# Patient Record
Sex: Male | Born: 1955
Health system: Southern US, Community
[De-identification: ages and names within clinical notes are randomized; demographics above are authoritative.]

## PROBLEM LIST (undated history)

## (undated) DIAGNOSIS — E785 Hyperlipidemia, unspecified: Secondary | ICD-10-CM

## (undated) DIAGNOSIS — I10 Essential (primary) hypertension: Secondary | ICD-10-CM

## (undated) DIAGNOSIS — J449 Chronic obstructive pulmonary disease, unspecified: Secondary | ICD-10-CM

## (undated) DIAGNOSIS — K635 Polyp of colon: Secondary | ICD-10-CM

## (undated) DIAGNOSIS — R0602 Shortness of breath: Secondary | ICD-10-CM

## (undated) DIAGNOSIS — J439 Emphysema, unspecified: Secondary | ICD-10-CM

## (undated) DIAGNOSIS — I509 Heart failure, unspecified: Secondary | ICD-10-CM

## (undated) HISTORY — DX: Hyperlipidemia, unspecified: E78.5

## (undated) HISTORY — DX: Polyp of colon: K63.5

## (undated) HISTORY — DX: Emphysema, unspecified: J43.9

## (undated) HISTORY — DX: Heart failure, unspecified: I50.9

## (undated) HISTORY — PX: COLONOSCOPY: SHX174

## (undated) HISTORY — PX: LIPOMA EXCISION: SHX5283

---

## 2001-05-27 ENCOUNTER — Emergency Department (HOSPITAL_COMMUNITY): Admission: EM | Admit: 2001-05-27 | Discharge: 2001-05-28 | Payer: Self-pay | Admitting: Emergency Medicine

## 2001-05-28 ENCOUNTER — Encounter: Payer: Self-pay | Admitting: Emergency Medicine

## 2001-06-28 ENCOUNTER — Inpatient Hospital Stay (HOSPITAL_COMMUNITY): Admission: EM | Admit: 2001-06-28 | Discharge: 2001-07-01 | Payer: Self-pay

## 2001-06-30 ENCOUNTER — Encounter: Payer: Self-pay | Admitting: Family Medicine

## 2001-07-06 ENCOUNTER — Encounter: Admission: RE | Admit: 2001-07-06 | Discharge: 2001-07-06 | Payer: Self-pay | Admitting: Family Medicine

## 2001-10-26 ENCOUNTER — Emergency Department (HOSPITAL_COMMUNITY): Admission: EM | Admit: 2001-10-26 | Discharge: 2001-10-27 | Payer: Self-pay

## 2002-11-16 ENCOUNTER — Emergency Department (HOSPITAL_COMMUNITY): Admission: EM | Admit: 2002-11-16 | Discharge: 2002-11-16 | Payer: Self-pay | Admitting: Emergency Medicine

## 2002-11-16 ENCOUNTER — Encounter: Payer: Self-pay | Admitting: Emergency Medicine

## 2004-05-31 ENCOUNTER — Inpatient Hospital Stay (HOSPITAL_COMMUNITY): Admission: EM | Admit: 2004-05-31 | Discharge: 2004-06-01 | Payer: Self-pay | Admitting: Emergency Medicine

## 2004-05-31 ENCOUNTER — Encounter (INDEPENDENT_AMBULATORY_CARE_PROVIDER_SITE_OTHER): Payer: Self-pay | Admitting: Cardiology

## 2004-07-01 ENCOUNTER — Emergency Department (HOSPITAL_COMMUNITY): Admission: EM | Admit: 2004-07-01 | Discharge: 2004-07-01 | Payer: Self-pay | Admitting: Emergency Medicine

## 2004-08-05 ENCOUNTER — Ambulatory Visit: Payer: Self-pay | Admitting: Family Medicine

## 2004-08-26 ENCOUNTER — Inpatient Hospital Stay (HOSPITAL_COMMUNITY): Admission: EM | Admit: 2004-08-26 | Discharge: 2004-08-31 | Payer: Self-pay | Admitting: Emergency Medicine

## 2004-08-26 ENCOUNTER — Ambulatory Visit: Payer: Self-pay | Admitting: Family Medicine

## 2004-09-06 ENCOUNTER — Ambulatory Visit: Payer: Self-pay | Admitting: Family Medicine

## 2004-11-01 ENCOUNTER — Ambulatory Visit: Payer: Self-pay | Admitting: Family Medicine

## 2004-12-03 ENCOUNTER — Ambulatory Visit: Payer: Self-pay | Admitting: Sports Medicine

## 2005-05-09 ENCOUNTER — Ambulatory Visit (HOSPITAL_COMMUNITY): Admission: RE | Admit: 2005-05-09 | Discharge: 2005-05-09 | Payer: Self-pay | Admitting: Family Medicine

## 2005-05-09 ENCOUNTER — Ambulatory Visit: Payer: Self-pay | Admitting: Family Medicine

## 2005-07-30 ENCOUNTER — Emergency Department (HOSPITAL_COMMUNITY): Admission: EM | Admit: 2005-07-30 | Discharge: 2005-07-30 | Payer: Self-pay | Admitting: Emergency Medicine

## 2005-09-13 ENCOUNTER — Ambulatory Visit: Payer: Self-pay | Admitting: Family Medicine

## 2005-09-27 ENCOUNTER — Emergency Department (HOSPITAL_COMMUNITY): Admission: EM | Admit: 2005-09-27 | Discharge: 2005-09-27 | Payer: Self-pay | Admitting: Emergency Medicine

## 2005-10-18 ENCOUNTER — Emergency Department (HOSPITAL_COMMUNITY): Admission: EM | Admit: 2005-10-18 | Discharge: 2005-10-18 | Payer: Self-pay | Admitting: Family Medicine

## 2005-10-26 ENCOUNTER — Ambulatory Visit: Payer: Self-pay | Admitting: Pulmonary Disease

## 2005-10-26 ENCOUNTER — Ambulatory Visit: Payer: Self-pay | Admitting: Family Medicine

## 2005-10-26 ENCOUNTER — Inpatient Hospital Stay (HOSPITAL_COMMUNITY): Admission: EM | Admit: 2005-10-26 | Discharge: 2005-11-02 | Payer: Self-pay | Admitting: Emergency Medicine

## 2005-11-08 ENCOUNTER — Ambulatory Visit: Payer: Self-pay | Admitting: Sports Medicine

## 2006-01-27 ENCOUNTER — Ambulatory Visit: Payer: Self-pay | Admitting: Family Medicine

## 2006-04-13 ENCOUNTER — Ambulatory Visit: Payer: Self-pay | Admitting: Sports Medicine

## 2006-05-15 ENCOUNTER — Ambulatory Visit: Payer: Self-pay | Admitting: Family Medicine

## 2007-01-18 DIAGNOSIS — I10 Essential (primary) hypertension: Secondary | ICD-10-CM

## 2007-01-18 DIAGNOSIS — Z86010 Personal history of colonic polyps: Secondary | ICD-10-CM

## 2007-01-31 ENCOUNTER — Telehealth: Payer: Self-pay | Admitting: *Deleted

## 2007-04-23 ENCOUNTER — Ambulatory Visit: Payer: Self-pay | Admitting: Sports Medicine

## 2007-04-23 LAB — CONVERTED CEMR LAB

## 2007-04-27 ENCOUNTER — Encounter (INDEPENDENT_AMBULATORY_CARE_PROVIDER_SITE_OTHER): Payer: Self-pay | Admitting: Family Medicine

## 2007-04-27 ENCOUNTER — Ambulatory Visit: Payer: Self-pay | Admitting: Sports Medicine

## 2007-04-27 LAB — CONVERTED CEMR LAB
Cholesterol: 174 mg/dL (ref 0–200)
HDL: 58 mg/dL (ref >39)
Total CHOL/HDL Ratio: 3

## 2007-04-30 ENCOUNTER — Encounter (INDEPENDENT_AMBULATORY_CARE_PROVIDER_SITE_OTHER): Payer: Self-pay | Admitting: Family Medicine

## 2007-05-14 ENCOUNTER — Telehealth: Payer: Self-pay | Admitting: *Deleted

## 2007-07-26 ENCOUNTER — Telehealth (INDEPENDENT_AMBULATORY_CARE_PROVIDER_SITE_OTHER): Payer: Self-pay | Admitting: Family Medicine

## 2007-07-27 ENCOUNTER — Telehealth (INDEPENDENT_AMBULATORY_CARE_PROVIDER_SITE_OTHER): Payer: Self-pay | Admitting: Family Medicine

## 2007-08-10 ENCOUNTER — Emergency Department (HOSPITAL_COMMUNITY): Admission: EM | Admit: 2007-08-10 | Discharge: 2007-08-11 | Payer: Self-pay | Admitting: Emergency Medicine

## 2007-08-13 ENCOUNTER — Telehealth: Payer: Self-pay | Admitting: *Deleted

## 2007-08-14 ENCOUNTER — Ambulatory Visit: Payer: Self-pay | Admitting: Family Medicine

## 2007-08-21 ENCOUNTER — Encounter (INDEPENDENT_AMBULATORY_CARE_PROVIDER_SITE_OTHER): Payer: Self-pay | Admitting: Family Medicine

## 2007-08-21 ENCOUNTER — Ambulatory Visit (HOSPITAL_COMMUNITY): Admission: RE | Admit: 2007-08-21 | Discharge: 2007-08-21 | Payer: Self-pay | Admitting: Family Medicine

## 2007-08-27 ENCOUNTER — Telehealth (INDEPENDENT_AMBULATORY_CARE_PROVIDER_SITE_OTHER): Payer: Self-pay | Admitting: Family Medicine

## 2007-09-06 ENCOUNTER — Ambulatory Visit: Payer: Self-pay | Admitting: Family Medicine

## 2007-10-05 ENCOUNTER — Ambulatory Visit: Payer: Self-pay | Admitting: Family Medicine

## 2007-10-05 ENCOUNTER — Encounter (INDEPENDENT_AMBULATORY_CARE_PROVIDER_SITE_OTHER): Payer: Self-pay | Admitting: Family Medicine

## 2007-10-05 LAB — CONVERTED CEMR LAB
CO2: 24 meq/L (ref 19–32)
Calcium: 10.2 mg/dL (ref 8.4–10.5)
Creatinine, Ser: 1.14 mg/dL (ref 0.40–1.50)
Glucose, Bld: 65 mg/dL — ABNORMAL LOW (ref 70–99)

## 2007-10-06 ENCOUNTER — Encounter (INDEPENDENT_AMBULATORY_CARE_PROVIDER_SITE_OTHER): Payer: Self-pay | Admitting: Family Medicine

## 2007-10-29 ENCOUNTER — Ambulatory Visit: Payer: Self-pay | Admitting: Family Medicine

## 2007-12-10 ENCOUNTER — Telehealth (INDEPENDENT_AMBULATORY_CARE_PROVIDER_SITE_OTHER): Payer: Self-pay | Admitting: Family Medicine

## 2008-01-05 ENCOUNTER — Emergency Department (HOSPITAL_COMMUNITY): Admission: EM | Admit: 2008-01-05 | Discharge: 2008-01-05 | Payer: Self-pay | Admitting: Emergency Medicine

## 2008-01-25 ENCOUNTER — Telehealth (INDEPENDENT_AMBULATORY_CARE_PROVIDER_SITE_OTHER): Payer: Self-pay | Admitting: Family Medicine

## 2008-02-15 ENCOUNTER — Ambulatory Visit: Payer: Self-pay | Admitting: Family Medicine

## 2008-06-20 ENCOUNTER — Ambulatory Visit: Payer: Self-pay | Admitting: Family Medicine

## 2008-06-20 ENCOUNTER — Encounter: Payer: Self-pay | Admitting: *Deleted

## 2008-07-30 ENCOUNTER — Telehealth: Payer: Self-pay | Admitting: *Deleted

## 2008-08-04 ENCOUNTER — Telehealth (INDEPENDENT_AMBULATORY_CARE_PROVIDER_SITE_OTHER): Payer: Self-pay | Admitting: Family Medicine

## 2008-09-01 ENCOUNTER — Telehealth (INDEPENDENT_AMBULATORY_CARE_PROVIDER_SITE_OTHER): Payer: Self-pay | Admitting: Family Medicine

## 2008-11-25 ENCOUNTER — Telehealth (INDEPENDENT_AMBULATORY_CARE_PROVIDER_SITE_OTHER): Payer: Self-pay | Admitting: Family Medicine

## 2008-12-02 ENCOUNTER — Ambulatory Visit: Payer: Self-pay | Admitting: Family Medicine

## 2008-12-03 ENCOUNTER — Encounter (INDEPENDENT_AMBULATORY_CARE_PROVIDER_SITE_OTHER): Payer: Self-pay | Admitting: Family Medicine

## 2008-12-04 ENCOUNTER — Telehealth (INDEPENDENT_AMBULATORY_CARE_PROVIDER_SITE_OTHER): Payer: Self-pay | Admitting: Family Medicine

## 2008-12-08 ENCOUNTER — Telehealth (INDEPENDENT_AMBULATORY_CARE_PROVIDER_SITE_OTHER): Payer: Self-pay | Admitting: *Deleted

## 2008-12-12 ENCOUNTER — Encounter (INDEPENDENT_AMBULATORY_CARE_PROVIDER_SITE_OTHER): Payer: Self-pay | Admitting: Family Medicine

## 2008-12-22 ENCOUNTER — Observation Stay (HOSPITAL_COMMUNITY): Admission: EM | Admit: 2008-12-22 | Discharge: 2008-12-24 | Payer: Self-pay | Admitting: Emergency Medicine

## 2008-12-22 ENCOUNTER — Encounter: Payer: Self-pay | Admitting: Family Medicine

## 2008-12-22 ENCOUNTER — Ambulatory Visit: Payer: Self-pay | Admitting: Family Medicine

## 2008-12-31 ENCOUNTER — Ambulatory Visit: Payer: Self-pay | Admitting: Family Medicine

## 2008-12-31 DIAGNOSIS — J449 Chronic obstructive pulmonary disease, unspecified: Secondary | ICD-10-CM | POA: Insufficient documentation

## 2008-12-31 DIAGNOSIS — N529 Male erectile dysfunction, unspecified: Secondary | ICD-10-CM

## 2009-02-24 ENCOUNTER — Telehealth: Payer: Self-pay | Admitting: *Deleted

## 2009-04-17 ENCOUNTER — Ambulatory Visit: Payer: Self-pay | Admitting: Family Medicine

## 2009-04-17 ENCOUNTER — Encounter (INDEPENDENT_AMBULATORY_CARE_PROVIDER_SITE_OTHER): Payer: Self-pay | Admitting: Family Medicine

## 2009-04-17 LAB — CONVERTED CEMR LAB
ALT: 28 units/L (ref 0–53)
BUN: 17 mg/dL (ref 6–23)
CO2: 22 meq/L (ref 19–32)
Cholesterol: 171 mg/dL (ref 0–200)
Creatinine, Ser: 1.24 mg/dL (ref 0.40–1.50)
HDL: 49 mg/dL (ref >39)
Total Bilirubin: 0.5 mg/dL (ref 0.3–1.2)
Total CHOL/HDL Ratio: 3.5
VLDL: 14 mg/dL (ref 0–40)

## 2009-04-18 ENCOUNTER — Encounter (INDEPENDENT_AMBULATORY_CARE_PROVIDER_SITE_OTHER): Payer: Self-pay | Admitting: Family Medicine

## 2009-07-25 ENCOUNTER — Emergency Department (HOSPITAL_COMMUNITY): Admission: EM | Admit: 2009-07-25 | Discharge: 2009-07-25 | Payer: Self-pay | Admitting: Emergency Medicine

## 2009-08-03 ENCOUNTER — Ambulatory Visit: Payer: Self-pay | Admitting: Family Medicine

## 2009-08-04 ENCOUNTER — Encounter (INDEPENDENT_AMBULATORY_CARE_PROVIDER_SITE_OTHER): Payer: Self-pay | Admitting: *Deleted

## 2009-08-04 DIAGNOSIS — F17200 Nicotine dependence, unspecified, uncomplicated: Secondary | ICD-10-CM | POA: Insufficient documentation

## 2009-09-25 ENCOUNTER — Telehealth: Payer: Self-pay | Admitting: *Deleted

## 2009-09-28 ENCOUNTER — Telehealth: Payer: Self-pay | Admitting: *Deleted

## 2009-10-01 ENCOUNTER — Emergency Department (HOSPITAL_COMMUNITY): Admission: EM | Admit: 2009-10-01 | Discharge: 2009-10-02 | Payer: Self-pay | Admitting: Emergency Medicine

## 2009-10-20 ENCOUNTER — Inpatient Hospital Stay (HOSPITAL_COMMUNITY): Admission: EM | Admit: 2009-10-20 | Discharge: 2009-10-22 | Payer: Self-pay | Admitting: Emergency Medicine

## 2009-10-20 ENCOUNTER — Ambulatory Visit: Payer: Self-pay | Admitting: Family Medicine

## 2009-10-20 ENCOUNTER — Encounter: Payer: Self-pay | Admitting: Family Medicine

## 2009-11-02 ENCOUNTER — Ambulatory Visit: Payer: Self-pay | Admitting: Family Medicine

## 2010-01-06 ENCOUNTER — Encounter: Payer: Self-pay | Admitting: Family Medicine

## 2010-02-22 ENCOUNTER — Encounter: Payer: Self-pay | Admitting: Family Medicine

## 2010-03-04 ENCOUNTER — Emergency Department (HOSPITAL_COMMUNITY): Admission: EM | Admit: 2010-03-04 | Discharge: 2010-03-04 | Payer: Self-pay | Admitting: Emergency Medicine

## 2010-05-17 ENCOUNTER — Encounter: Payer: Self-pay | Admitting: Family Medicine

## 2010-05-19 ENCOUNTER — Encounter: Admission: RE | Admit: 2010-05-19 | Discharge: 2010-05-19 | Payer: Self-pay | Admitting: Family Medicine

## 2010-05-19 ENCOUNTER — Ambulatory Visit: Payer: Self-pay | Admitting: Family Medicine

## 2010-05-20 ENCOUNTER — Encounter: Payer: Self-pay | Admitting: *Deleted

## 2010-05-20 ENCOUNTER — Telehealth: Payer: Self-pay | Admitting: Family Medicine

## 2010-07-03 ENCOUNTER — Emergency Department (HOSPITAL_COMMUNITY): Admission: EM | Admit: 2010-07-03 | Discharge: 2010-07-04 | Payer: Self-pay | Admitting: Emergency Medicine

## 2010-07-04 ENCOUNTER — Encounter: Payer: Self-pay | Admitting: Family Medicine

## 2010-09-13 ENCOUNTER — Emergency Department (HOSPITAL_COMMUNITY): Admission: EM | Admit: 2010-09-13 | Discharge: 2010-09-13 | Payer: Self-pay | Admitting: Emergency Medicine

## 2010-09-26 ENCOUNTER — Telehealth: Payer: Self-pay | Admitting: Family Medicine

## 2010-09-26 ENCOUNTER — Emergency Department (HOSPITAL_COMMUNITY): Admission: EM | Admit: 2010-09-26 | Discharge: 2010-09-26 | Payer: Self-pay | Admitting: Emergency Medicine

## 2010-09-29 ENCOUNTER — Encounter: Payer: Self-pay | Admitting: Family Medicine

## 2010-10-12 ENCOUNTER — Ambulatory Visit: Payer: Self-pay | Admitting: Family Medicine

## 2010-10-12 ENCOUNTER — Encounter: Payer: Self-pay | Admitting: Family Medicine

## 2010-10-12 LAB — CONVERTED CEMR LAB
BUN: 13 mg/dL (ref 6–23)
Calcium: 9.8 mg/dL (ref 8.4–10.5)
Creatinine, Ser: 1.03 mg/dL (ref 0.40–1.50)
Glucose, Bld: 91 mg/dL (ref 70–99)
HCT: 45.5 % (ref 39.0–52.0)
PSA: 0.38 ng/mL (ref <=4.00)
Platelets: 267 10*3/uL (ref 150–400)
RDW: 12.7 % (ref 11.5–15.5)
Sodium: 139 meq/L (ref 135–145)
WBC: 5.9 10*3/uL (ref 4.0–10.5)

## 2010-10-26 ENCOUNTER — Encounter: Payer: Self-pay | Admitting: *Deleted

## 2010-10-26 ENCOUNTER — Ambulatory Visit: Payer: Self-pay

## 2010-11-10 ENCOUNTER — Ambulatory Visit: Payer: Self-pay | Admitting: Family Medicine

## 2010-11-12 ENCOUNTER — Telehealth: Payer: Self-pay | Admitting: Family Medicine

## 2010-11-17 ENCOUNTER — Encounter: Payer: Self-pay | Admitting: Family Medicine

## 2010-11-17 ENCOUNTER — Inpatient Hospital Stay (HOSPITAL_COMMUNITY)
Admission: EM | Admit: 2010-11-17 | Discharge: 2010-11-19 | Payer: Self-pay | Source: Home / Self Care | Attending: Family Medicine | Admitting: Family Medicine

## 2010-11-30 ENCOUNTER — Ambulatory Visit: Admission: RE | Admit: 2010-11-30 | Discharge: 2010-11-30 | Payer: Self-pay | Source: Home / Self Care

## 2010-12-21 NOTE — Miscellaneous (Signed)
Summary: proventil approved  Clinical Lists Changes prescriptions solutions approved the Proventil HFA for 1 yr.Golden Circle RN  May 20, 2010 12:17 PM

## 2010-12-21 NOTE — Assessment & Plan Note (Signed)
Summary: Hospital Consult   Primary Care Provider:  Cat Ta MD  CC:  SOB.  History of Present Illness: This is a 55  yo male with a PMH of COPD and HTN who presented to the ED w/ SOB and chest tightness.  EDP gave patient one treatment of albuterol/atrovent and prednisone.  After about an hour, the patient's symptoms resolved and he returned to baseline.  Initially, he was so SOB that he could only speak in phrases, but by the time we examined him, he was up walking and speaking in full sentences without any difficulty.   ROS: He c/o productive cough with white sputum which he says is a chronic problem.  He denies any CP, N/V, fever, or chills.  Denies any HA, diarrhea/constipation, numbness/tingling in extremities.    Current Problems (verified): 1)  Chronic Obstructive Pulmonary Disease, Acute Exacerbation  (ICD-491.21) 2)  Tobacco User  (ICD-305.1) 3)  Uri  (ICD-465.9) 4)  Special Screening Malignant Neoplasm of Prostate  (ICD-V76.44) 5)  Erectile Dysfunction  (ICD-302.72) 6)  COPD  (ICD-496) 7)  Tobacco Use, Quit  (ICD-V15.82) 8)  Rhinitis, Allergic  (ICD-477.9) 9)  Hypertension, Benign Systemic  (ICD-401.1) 10)  Colon Polyp  (ICD-211.3)  Current Medications (verified): 1)  Advair Diskus 500-50 Mcg/dose  Misc (Fluticasone-Salmeterol) .... One Inhalation Twice Daily As Directed 2)  Allegra 180 Mg  Tabs (Fexofenadine Hcl) .Marland Kitchen.. 1 Tablet By Mouth Daily 3)  Albuterol Sulfate (2.5 Mg/68ml) 0.083% Nebu (Albuterol Sulfate) .... Use 4 Times A Day As Needed For Sob 4)  Amlodipine Besylate 10 Mg Tabs (Amlodipine Besylate) .Marland Kitchen.. 1 Tab By Mouth Daily For Blood Pressure. 5)  Viagra 100 Mg Tabs (Sildenafil Citrate) .... 1/2 Tablet By Mouth As Directed 6)  Proair Hfa 108 (90 Base) Mcg/act Aers (Albuterol Sulfate) .... 2 Puffs Ever 4-6 Hours As Needed Wheezing or Short of Breath. Dispense For 1 Month. 7)  Tessalon Perles 100 Mg  Caps (Benzonatate) .... One By Mouth Tid 8)  Prednisone 20 Mg Tabs  (Prednisone) .... 3 By Mouth X 10 Days 9)  Robitussin To Go Cgh/chest Dm 100-10 Mg/64ml Liqd (Dextromethorphan-Guaifenesin)  Allergies (verified): 1)  ! * Spiriva  Past History:  Past Medical History: Last updated: 11/02/2009 COPD exac admit 10/2009: required step down bed for bipap, no intubation COPD- intubated twice! Hypertension Colonoscopy by Dr. Elnoria Howard in 2007 showed colon polyps (adenomatous and hyperplastic).  Rpt colonoscopy in 5 yrs. PFT (9/08)-> severe obstruction with positive response to bronchodilator  Past Surgical History: Last updated: 12/22/2008 none  Family History: Last updated: 10/20/2009 mother has asthma (updated 10/20/2009)  Social History: Last updated: 10/20/2009 On Disability secondary to lung disease. Used to work as a Retail banker.  Married. Born and raised in [place].  Education-through 9th grade.  Smoked 1/3 to 1/2 ppd for 30 years, quit in 2009.  Used to drink heavily, none now.  No drugs.  pt does report smoking occasionally (updated 10/20/2009)  Review of Systems       per HPI  Physical Exam  General:  alert.  NAD. Head:  normocephalic and atraumatic.   Eyes:  vision grossly intact, pupils equal, pupils round, and pupils reactive to light.   Ears:  R ear normal and L ear normal.   Mouth:  pharynx pink and moist and poor dentition.   Neck:  supple, no JVD, and no cervical lymphadenopathy.   Lungs:  no intercostal retractions, no accessory muscle use, R wheezes, and L wheezes.   Heart:  normal rate, regular rhythm, no murmur, no gallop, and no rub.   Abdomen:  soft, non-tender, and normal bowel sounds.   Additional Exam:  Vitals: T 98.4, P 88, RR 24. 133/84 94% ra Labs: wnl CXR: no acute disease   Impression & Recommendations:  Problem # 1:  CHRONIC OBSTRUCTIVE PULMONARY DISEASE, ACUTE EXACERBATION (ICD-491.21) Patient came to ED with SOB and chest tightness.  After an hour of breathing tx and prednisone 60mg , patient  felt much better.  Said that the chest tightness was improving.  He was up and walking around the ED without any O2 supplementation.  CXR was nml, vitals were stable, and labs were wnl.  Discussed with EDP our plan to d/c the patient home after receiving 2-3 more breathing tx's.  Advised patient to f/u with PCP in the next 7-10 days.  Gave him a Rx for Prednisone 60mg  by mouth daily x 5days.  Red flags discussed for promptly seeking medical care.  Patient understood and agreed with our plan to send him home.  Complete Medication List: 1)  Advair Diskus 500-50 Mcg/dose Misc (Fluticasone-salmeterol) .... One inhalation twice daily as directed 2)  Allegra 180 Mg Tabs (Fexofenadine hcl) .Marland Kitchen.. 1 tablet by mouth daily 3)  Albuterol Sulfate (2.5 Mg/17ml) 0.083% Nebu (Albuterol sulfate) .... Use 4 times a day as needed for sob 4)  Amlodipine Besylate 10 Mg Tabs (Amlodipine besylate) .Marland Kitchen.. 1 tab by mouth daily for blood pressure. 5)  Viagra 100 Mg Tabs (Sildenafil citrate) .... 1/2 tablet by mouth as directed 6)  Proair Hfa 108 (90 Base) Mcg/act Aers (Albuterol sulfate) .... 2 puffs ever 4-6 hours as needed wheezing or short of breath. dispense for 1 month. 7)  Tessalon Perles 100 Mg Caps (Benzonatate) .... One by mouth tid 8)  Prednisone 20 Mg Tabs (Prednisone) .... 3 by mouth x 10 days 9)  Robitussin To Go Cgh/chest Dm 100-10 Mg/36ml Liqd (Dextromethorphan-guaifenesin)

## 2010-12-21 NOTE — Assessment & Plan Note (Signed)
Summary: breathing problem, using nebs more now/ls   Vital Signs:  Patient profile:   55 year old male Weight:      152.6 pounds BMI:     23.12 Temp:     98.4 degrees F Pulse rate:   76 / minute BP sitting:   138 / 90  (left arm)  Vitals Entered By: Starleen Blue RN (May 19, 2010 10:23 AM) CC: breathing prob Is Patient Diabetic? No Pain Assessment Patient in pain? no        Primary Care Provider:  Cat Ta MD  CC:  breathing prob.  History of Present Illness: 55 year old M:  1. COPD: Hx of COPD with previous hospitalizations for exacerbations, last 12/10. Hx of intubation x 2. c/o increased dyspnea, cough, and sputum production over several weeks. He denies fever/chills, N/V/D, LE edema, CP. He endorses a runny nose that has worsened since turning the [facility] on. he has been taking several full dose aspirin daily for his cough.  Habits & Providers  Alcohol-Tobacco-Diet     Tobacco Status: quit > 6 months  Current Medications (verified): 1)  Advair Diskus 500-50 Mcg/dose  Misc (Fluticasone-Salmeterol) .... One Inhalation Twice Daily As Directed 2)  Allegra 180 Mg  Tabs (Fexofenadine Hcl) .Marland Kitchen.. 1 Tablet By Mouth Daily 3)  Albuterol Sulfate (2.5 Mg/24ml) 0.083% Nebu (Albuterol Sulfate) .... Use 4 Times A Day As Needed For Sob 4)  Amlodipine Besylate 10 Mg Tabs (Amlodipine Besylate) .Marland Kitchen.. 1 Tab By Mouth Daily For Blood Pressure. 5)  Viagra 100 Mg Tabs (Sildenafil Citrate) .... 1/2 Tablet By Mouth As Directed 6)  Proair Hfa 108 (90 Base) Mcg/act Aers (Albuterol Sulfate) .... 2 Puffs Ever 4-6 Hours As Needed Wheezing or Short of Breath. Dispense For 1 Month. 7)  Tessalon Perles 100 Mg  Caps (Benzonatate) .... One By Mouth Tid 8)  Prednisone 20 Mg Tabs (Prednisone) .... 3 By Mouth X 10 Days 9)  Doxycycline Hyclate 100 Mg Caps (Doxycycline Hyclate) .... Take 1 Tab Twice A Day  Allergies (verified): 1)  ! * Spiriva PMH-FH-SH reviewed for relevance  Social History: Smoking Status:   quit > 6 months  Review of Systems General:  Denies chills and fever. ENT:  Complains of nasal congestion; denies earache and sore throat. CV:  Complains of shortness of breath with exertion; denies chest pain or discomfort, palpitations, and swelling of feet. Resp:  Complains of cough, sputum productive, and wheezing. GI:  Denies abdominal pain, constipation, diarrhea, vomiting, and vomiting blood. Derm:  Denies rash.  Physical Exam  General:  Well-developed, well-nourished, in no acute distress; alert, appropriate and cooperative throughout examination. Vitals reviewed.  Lungs:  Decreased breath sounds bilaterally. Prolonged expiratory phase. Mild expiratory wheeze bilaterally. Heart:  Normal rate and regular rhythm. S1 and S2 normal without gallop, murmur, click, rub or other extra sounds. Pulses:  2 + DP. Extremities:  No edema. Skin:  Intact without suspicious lesions or rashes. Psych:  Normally interactive, good eye contact, not anxious appearing, and not depressed appearing.     Impression & Recommendations:  Problem # 1:  CHRONIC OBSTRUCTIVE PULMONARY DISEASE, ACUTE EXACERBATION (ICD-491.21) Assessment New Rx Doxy, Prednisone burst, Tessalon as needed for cough. Discussed appropriate use of inhalers. Will check CXR. Advised patient to stop taking several full dose ASA daily. Continue ASA 81 mg by mouth daily.  Orders: CXR- 2view (CXR) [facility]- Est  Level 4 ([address])  Problem # 2:  TOBACCO USE, QUIT (ICD-V15.82) Assessment: Unchanged  Problem #  3:  RHINITIS, ALLERGIC (ICD-477.9) Assessment: Unchanged  His updated medication list for this problem includes:    Allegra 180 Mg Tabs (Fexofenadine hcl) .Marland Kitchen... 1 tablet by mouth daily  Orders: [facility]- Est  Level 4 ([address])  Complete Medication List: 1)  Advair Diskus 500-50 Mcg/dose Misc (Fluticasone-salmeterol) .... One inhalation twice daily as directed 2)  Allegra 180 Mg Tabs (Fexofenadine hcl) .Marland Kitchen.. 1 tablet by mouth daily 3)   Albuterol Sulfate (2.5 Mg/36ml) 0.083% Nebu (Albuterol sulfate) .... Use 4 times a day as needed for sob 4)  Amlodipine Besylate 10 Mg Tabs (Amlodipine besylate) .Marland Kitchen.. 1 tab by mouth daily for blood pressure. 5)  Viagra 100 Mg Tabs (Sildenafil citrate) .... 1/2 tablet by mouth as directed 6)  Proair Hfa 108 (90 Base) Mcg/act Aers (Albuterol sulfate) .... 2 puffs ever 4-6 hours as needed wheezing or short of breath. dispense for 1 month. 7)  Tessalon Perles 100 Mg Caps (Benzonatate) .... One by mouth tid 8)  Prednisone 20 Mg Tabs (Prednisone) .... 3 by mouth x 10 days 9)  Doxycycline Hyclate 100 Mg Caps (Doxycycline hyclate) .... Take 1 tab twice a day 10)  Robitussin To Go Cgh/chest Dm 100-10 Mg/59ml Liqd (Dextromethorphan-guaifenesin)  Patient Instructions: 1)  It was nice to meet you today! 2)  We are treating your COPD exacerbation with a prednisone burst, doxycycline, and tessalon perrles. 3)  Decrease your albuterol use as we discussed. 4)  Only take Aspirin 81 mg by mouth daily. 5)  Good job on stopping smoking! Prescriptions: DOXYCYCLINE HYCLATE 100 MG CAPS (DOXYCYCLINE HYCLATE) Take 1 tab twice a day  #20 x 0   Entered and Authorized by:   Helane Rima DO   Signed by:   Helane Rima DO on 05/19/2010   Method used:   [facility]        [facility] [address] [phone]* (retail)       [address]       [place], [place]  [address]       Ph: [phone] or [phone]       Fax: [phone]   RxID:   [id number] PREDNISONE 20 MG TABS (PREDNISONE) 3 by mouth X 10 days  #30 x 0   Entered and Authorized by:   Helane Rima DO   Signed by:   Helane Rima DO on 05/19/2010   Method used:   [facility]        [facility] [address] [phone]* (retail)       [address]       [place], [place]  [address]       Ph: [phone] or [phone]       Fax: [phone]   RxID:   [id number] TESSALON PERLES 100 MG  CAPS (BENZONATATE) one by mouth TID  #90 x 1    Entered and Authorized by:   Helane Rima DO   Signed by:   Helane Rima DO on 05/19/2010   Method used:   [facility]        [facility] [phone]* (retail)       [address]       [place], [place]  [address]       Ph: [phone] or [phone]       Fax: [phone]   RxID:   [id number]   Prevention & Chronic Care Immunizations   Influenza vaccine: Fluvax MCR  (12/02/2008)   Influenza vaccine deferral: Not indicated  (11/02/2009)  Influenza vaccine due: 10/21/2010    Tetanus booster: 01/19/2006: Done.   Tetanus booster due: 01/20/2016    Pneumococcal vaccine: Done.  (08/21/2004)   Pneumococcal vaccine due: None  Colorectal Screening   Hemoccult: Done.  (01/19/2006)   Hemoccult due: Not Indicated    Colonoscopy: polyps (Dr. Elnoria Howard)  (06/02/2006)   Colonoscopy due: 06/03/2011  Other Screening   PSA: 0.26  (04/17/2009)   PSA action/deferral: PSA ordered  (04/23/2007)   PSA due due: 04/26/2008   Smoking status: quit > 6 months  (05/19/2010)  Lipids   Total Cholesterol: 171  (04/17/2009)   LDL: 108  (04/17/2009)   LDL Direct: Not documented   HDL: 49  (04/17/2009)   Triglycerides: 68  (04/17/2009)  Hypertension   Last Blood Pressure: 138 / 90  (05/19/2010)   Serum creatinine: 1.24  (04/17/2009)   Serum potassium 4.3  (04/17/2009)    Hypertension flowsheet reviewed?: Yes   Progress toward BP goal: At goal  Self-Management Support :   Personal Goals (by the next clinic visit) :      Personal blood pressure goal: 140/90  (05/19/2010)   Patient will work on the following items until the next clinic visit to reach self-care goals:     Medications and monitoring: take my medicines every day, bring all of my medications to every visit  (05/19/2010)     Eating: drink diet soda or water instead of juice or soda, eat more vegetables, use fresh or frozen vegetables, eat foods that are low in salt, eat baked foods instead of fried foods, eat fruit  for snacks and desserts, limit or avoid alcohol  (05/19/2010)     Activity: take a 30 minute walk every day, take the stairs instead of the elevator, park at the far end of the parking lot  (05/19/2010)    Hypertension self-management support: Written self-care plan  (05/19/2010)   Hypertension self-care plan printed.

## 2010-12-21 NOTE — Miscellaneous (Signed)
Summary: Changing Prob List   Clinical Lists Changes  Problems: Removed problem of URI (ICD-465.9) Removed problem of SPECIAL SCREENING MALIGNANT NEOPLASM OF PROSTATE (ICD-V76.44) Removed problem of TOBACCO USE, QUIT (ICD-V15.82) Removed problem of RHINITIS, ALLERGIC (ICD-477.9) Removed problem of CHRONIC OBSTRUCTIVE PULMONARY DISEASE, ACUTE EXACERBATION (ICD-491.21)

## 2010-12-21 NOTE — Miscellaneous (Signed)
Summary: proair is covered  Clinical Lists Changes I called & spoke with a company rep. proair is covered with no restrictions or prior auth. to pcp to change drugs if acceptable. everything else will require a prior auth & we have alreadu been declined on 2 others.Golden Circle RN  February 22, 2010 3:09 PM  Medications: Changed medication from VENTOLIN HFA 108 (90 BASE) MCG/ACT AERS (ALBUTEROL SULFATE) 2 puffs Inhaled every 4 hours as needed for wheezing or shortness of breath to PROAIR HFA 108 (90 BASE) MCG/ACT AERS (ALBUTEROL SULFATE) 2 puffs ever 4-6 hours as needed wheezing or short of breath. Dispense for 1 month. - Signed Rx of PROAIR HFA 108 (90 BASE) MCG/ACT AERS (ALBUTEROL SULFATE) 2 puffs ever 4-6 hours as needed wheezing or short of breath. Dispense for 1 month.;  #1 x 3;  Signed;  Entered by: Angeline Slim MD;  Authorized by: Angeline Slim MD;  Method used: [facility] [address] [phone]*, [address], [place], [place]  [address], Ph: [phone] or [phone], Fax: [phone]    Prescriptions: PROAIR HFA 108 (90 BASE) MCG/ACT AERS (ALBUTEROL SULFATE) 2 puffs ever 4-6 hours as needed wheezing or short of breath. Dispense for 1 month.  #1 x 3   Entered and Authorized by:   Angeline Slim MD   Signed by:   Angeline Slim MD on 02/22/2010   Method used:   [facility]        [facility] [phone]* (retail)       [address]       [place], [place]  [address]       Ph: [phone] or [phone]       Fax: [phone]   RxID:   [id number]

## 2010-12-21 NOTE — Miscellaneous (Signed)
Summary: proventil not covered  Clinical Lists Changes [facility] [phone]. his insurance does not cover this anymore. she asked if md will switch him to ventolin. message to pcp. if ok, will change & resend to pharmacy.Golden Circle RN  January 06, 2010 4:09 PM  Medications: Added new medication of VENTOLIN HFA 108 (90 BASE) MCG/ACT AERS (ALBUTEROL SULFATE) 2 puffs Inhaled every 4 hours as needed for wheezing or shortness of breath - Signed Rx of VENTOLIN HFA 108 (90 BASE) MCG/ACT AERS (ALBUTEROL SULFATE) 2 puffs Inhaled every 4 hours as needed for wheezing or shortness of breath;  #1 x 6;  Signed;  Entered by: Angeline Slim MD;  Authorized by: Angeline Slim MD;  Method used: [facility] [phone]*, [address], [place], [place]  [address], Ph: [phone] or [phone], Fax: [phone]    Prescriptions: VENTOLIN HFA 108 (90 BASE) MCG/ACT AERS (ALBUTEROL SULFATE) 2 puffs Inhaled every 4 hours as needed for wheezing or shortness of breath  #1 x 6   Entered and Authorized by:   Angeline Slim MD   Signed by:   Angeline Slim MD on 01/06/2010   Method used:   [facility]        [facility] [address] [phone]* (retail)       [address]       [place], [place]  [address]       Ph: [phone] or [phone]       Fax: [phone]   RxID:   [id number]  Ok to switch to ventolin.  Kaamil Morefield MD  January 06, 2010 4:29 PM  Appended Document: proventil not covered

## 2010-12-21 NOTE — Progress Notes (Signed)
Summary: COPD/HTN  Phone Note Other Incoming   Caller: [facility] Details for Reason: ER F/U needed Summary of Call:  [name] presented to ED with SOB, COPD exacerbation likely from viral etiology, CXR showed no active disease, labs wnl, BP was elevated at  175/84 and repeat 181/106, pt not taking any meds, sats remained 99% RA. Spoke with EDP who was discharging from ED,needs f/u appt for the BP at [facility]. Given Albuterol/atrovent, Solumedrol load, d/c home on Predisone taper, doxycycline x 7 days, albuteorl/advair Pt was on Norvasc 10mg   Initial call taken by: Milinda Antis MD,  September 26, 2010 8:34 AM

## 2010-12-21 NOTE — Miscellaneous (Signed)
Summary: pro air not working  Clinical Lists Changes pharmacy faxed back a request for proventil. states pt says the proair is not working. pa form to md if needed.Anthony Circle RN  May 17, 2010 3:27 PM    [facility] stating again that proair nor working for patient and requests PA for proventil. form is in MD box  ,Anthony Lo RN  May 18, 2010 2:26 PM   called patient to advise that MD will be in clinic tomorrow to fill out form. he has been on proair for 2 months and recently he is having to use nebs more . Thinks it may be the weather. states he knows he needs to see the doctor but has put off due to finances. encouraged him to come in for work in tomorrow to be checked. advised even when MD fills out PA it will take a  day or so to hear back from insurance company. he agrees and appointment is scheduled with Dr. Earlene Plater for work in appointment tomorrow. Anthony Lo RN  May 18, 2010 2:43 PM  Received form in my box.  Cannot read phone number on form to call for PA.  The number I called was a hot-line.  I have not seen pt since 10/2009.  I have not been able to assess his inhaler needs.  He needs appt.  Rateel Beldin MD  May 18, 2010 3:03 PM

## 2010-12-21 NOTE — Miscellaneous (Signed)
Summary: triage  Clinical Lists Changes patient comes to office stating he has COPD. he has inhalers that he uses regularly. However this AM he left home in a hurry to get his wife to the ED  and he has been with her over there all day. he began to feel that he was getting short of breath and was afraid he is going to get in trouble with his breathing and he thought he could come here quicker than he could get home. RN checked BP 168/99, Pulse OX 97 %.. pulse 71. temp 97.7. advised patient that we are going to help him . put  him on work in schedule. advised patient to go back out to the waiting room and sign in.  I ask him if  his breathing is OK at this moment and he says yes, he feels that he is calming down. Does not appear in acute distress . RN was  starting this note to advise MD that will see him of problem.   within a few minutes of patient arriving  [name] from front office advised RN   that patient had walked out stating he could get home to get his inhaler quicker than the could be seen here. went out to parking lot but patient had left. Theresia Lo RN  October 26, 2010 2:49 PM

## 2010-12-21 NOTE — Assessment & Plan Note (Signed)
Summary: elevated BP,df    Vital Signs:  Patient profile:   55 year old male Height:      68.25 inches Weight:      145 pounds BMI:     21.97 Temp:     98.3 degrees F oral Pulse rate:   76 / minute BP sitting:   166 / 100  (left arm) Cuff size:   large  Vitals Entered By: Tessie Fass CMA (October 12, 2010 10:31 AM)  Serial Vital Signs/Assessments:  Time      Position  BP       Pulse  Resp  Temp     By 10:46 AM            150/90                         Tyhesha Dutson MD  CC: elevated BP Is Patient Diabetic? No Pain Assessment Patient in pain? no        Primary Care Provider:  Ruta Capece MD  CC:  elevated BP.  History of Present Illness: 55 y/o M here for elevated blood pressure.  Pt has not been seen by me in clinic for chronic medical conditions since 10/2009.  Since then he has been seen for dyspnea (/2011).  He has been going to the ER when his BP becomes elevated enough for him to be symptomatic.  Currently he complains of having intermitten headache.    HYPERTENSION Disease Monitoring Blood pressure range: 130s - 160s Medications: amlodipine 10mg  daily Compliance: no. Been out of med for months.  Lightheadedness:no   Edema:no  Chest pain:no  Dyspnea:sometimes (he has COPD). blurry vision: no, Headache: sometimes, relieved with tylenol Prevention Exercise: walking 1-2 miles daily  Salt restriction: yes    Habits & Providers  Alcohol-Tobacco-Diet     Tobacco Status: quit  Current Medications (verified): 1)  Advair Diskus 500-50 Mcg/dose  Misc (Fluticasone-Salmeterol) .... One Inhalation Twice Daily As Directed 2)  Amlodipine Besylate 10 Mg Tabs (Amlodipine Besylate) .Marland Kitchen.. 1 Tab By Mouth Daily For Blood Pressure. 3)  Proventil Hfa 108 (90 Base) Mcg/act Aers (Albuterol Sulfate) .... 2 Puffs Every 4-6 Hours As Needed Wheezing or Shortness of Breath. 4)  Claritin 10 Mg Tabs (Loratadine) .Marland Kitchen.. 1 Tab By Mouth Every Morning  Allergies (verified): 1)  ! * Spiriva  Past  History:  Past Medical History: COPD exac admit 10/2009: required step down bed for bipap, no intubation COPD- intubated twice! Hypertension Colonoscopy by Dr. Elnoria Howard in 2007 showed colon polyps (adenomatous and hyperplastic).  Rpt colonoscopy in 5 yrs. PFT (9/08)-> severe obstruction with positive response to bronchodilator  ***Medical Noncompliant  Family History: Mother: has asthma. Now age 65 with Alhzeimer Father: HTN, bladder cancer  Social History: On Disability secondary to lung disease. 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  Born and raised in [place].  Education-through 9th grade.  Smoked 1/3 to 1/2 ppd for 30 years, quit in 2009.    Used to drink heavily, none now.  No drugs. Pt does report smoking occasionally (updated 10/20/2009), but now has quit. Smoking Status:  quit  Review of Systems       per hpi   Physical Exam  General:  Well-developed,well-nourished,in no acute distress; alert,appropriate and cooperative throughout examination. Vitals reviewed Eyes:  No corneal or conjunctival inflammation noted. EOMI. Perrla. Funduscopic exam benign, without hemorrhages, exudates or papilledema. Vision grossly normal. Lungs:  Normal respiratory effort, chest expands symmetrically. Lungs are clear to auscultation, no crackles or wheezes. Heart:  Normal rate and regular rhythm. S1 and S2 normal without gallop, murmur, click, rub or other extra sounds. Pulses:  +2 bilaterally  Extremities:  No clubbing, cyanosis, edema Neurologic:  alert & oriented X3, cranial nerves II-XII intact, and strength normal in all extremities.     Impression & Recommendations:  Problem # 1:  HYPERTENSION, BENIGN SYSTEMIC (ICD-401.1) Assessment Deteriorated BP deteriorated and not at goal.  Pt's BP have been difficult to control since he has been very noncompliant.  He has not been seen by me for HTN since 10/2009.  He was recently seen in ER for headache likely  from hypertensive urgency.  He had a negative work up (CXR, EKG, POC CE, ISTAT, CBC).  Will refill his previous med of amlodipine 10mg .  Will check Bmet and CBC.  Pt to rtc in 1 month for f/u.    His updated medication list for this problem includes:    Amlodipine Besylate 10 Mg Tabs (Amlodipine besylate) .Marland Kitchen... 1 tab by mouth daily for blood pressure.  Orders: Basic Met-[facility] ([id number]) CBC-[facility] (09811) [facility]- Est Level  3 (91478)  Complete Medication List: 1)  Advair Diskus 500-50 Mcg/dose Misc (Fluticasone-salmeterol) .... One inhalation twice daily as directed 2)  Amlodipine Besylate 10 Mg Tabs (Amlodipine besylate) .Marland Kitchen.. 1 tab by mouth daily for blood pressure. 3)  Proventil Hfa 108 (90 Base) Mcg/act Aers (Albuterol sulfate) .... 2 puffs every 4-6 hours as needed wheezing or shortness of breath. 4)  Claritin 10 Mg Tabs (Loratadine) .Marland Kitchen.. 1 tab by mouth every morning  Other Orders: PSA-[facility] ([id number])  Patient Instructions: 1)  Please schedule a follow-up appointment in 1 month for blood pressure. 2)  Please take all your medication: Amlodipine 10mg  daily. 3)  Take your COPD meds: Advair, Proventil, Claritin. 4)  If Claritin does not work, call me and we'll return to [employer].  5)    6)  Take an Aspirin every day.  Prescriptions: CLARITIN 10 MG TABS (LORATADINE) 1 tab by mouth every morning  #30 x 6   Entered and Authorized by:   Angeline Slim MD   Signed by:   Angeline Slim MD on 10/12/2010   Method used:   Electronically to        [facility] [address] [phone]* (retail)       [address]       [place], [place]  [address]       Ph: [phone] or [phone]       Fax: [phone]   RxID:   [id number] PROVENTIL HFA 108 (90 BASE) MCG/ACT AERS (ALBUTEROL SULFATE) 2 puffs every 4-6 hours as needed wheezing or shortness of breath.  #1 x 1   Entered and Authorized by:   Angeline Slim MD   Signed by:   Angeline Slim MD on 10/12/2010   Method used:   [facility]ronically to        [facility] [address]  [phone]* (retail)       [address]       [place], [place]  [address]       Ph: [phone] or [phone]       Fax: [phone]   RxID:   [id number] AMLODIPINE BESYLATE 10 MG TABS (AMLODIPINE BESYLATE) 1 tab by mouth daily for blood pressure.  #34 x 1   Entered and Authorized by:   Angeline Slim MD   Signed by:  Nahlia Hellmann MD on 10/12/2010   Method used:   [facility]        [facility] [address] [phone]* (retail)       [address]       [place], [place]  [address]       Ph: [phone] or [phone]       Fax: [phone]   RxID:   [id number] ADVAIR DISKUS 500-50 MCG/DOSE  MISC (FLUTICASONE-SALMETEROL) One inhalation twice daily as directed  #60 Each x 1   Entered and Authorized by:   Angeline Slim MD   Signed by:   Angeline Slim MD on 10/12/2010   Method used:   [facility]ronically to        [facility] [address] [phone]* (retail)       [address]       [place], [place]  [address]       Ph: [phone] or [phone]       Fax: [phone]   RxID:   [id number]    Orders Added: 1)  Basic Met-[facility] [[id number]] 2)  CBC-[facility] [85027] 3)  PSA-[facility] [phone] 4)  [facility]- Est Level  3 [73710]     Prevention & Chronic Care Immunizations   Influenza vaccine: Fluvax MCR  (12/02/2008)   Influenza vaccine deferral: Not indicated  (11/02/2009)   Influenza vaccine due: 10/21/2010    Tetanus booster: 01/19/2006: Done.   Tetanus booster due: 01/20/2016    Pneumococcal vaccine: Done.  (08/21/2004)   Pneumococcal vaccine due: None  Colorectal Screening   Hemoccult: Done.  (01/19/2006)   Hemoccult due: Not Indicated    Colonoscopy: polyps (Dr. Elnoria Howard)  (06/02/2006)   Colonoscopy due: 06/03/2011  Other Screening   PSA: 0.26  (04/17/2009)   PSA ordered.   PSA action/deferral: PSA ordered  (04/23/2007)   PSA due due: 04/26/2008   Smoking status: quit  (10/12/2010)  Lipids   Total Cholesterol: 171  (04/17/2009)   LDL: 108  (04/17/2009)   LDL Direct: Not  documented   HDL: 49  (04/17/2009)   Triglycerides: 68  (04/17/2009)  Hypertension   Last Blood Pressure: 166 / 100  (10/12/2010)   Serum creatinine: 1.24  (04/17/2009)   Serum potassium 4.3  (04/17/2009)    Hypertension flowsheet reviewed?: Yes   Progress toward BP goal: Unchanged  Self-Management Support :   Personal Goals (by the next clinic visit) :      Personal blood pressure goal: 140/90  (05/19/2010)   Hypertension self-management support: Written self-care plan  (05/19/2010)

## 2010-12-21 NOTE — Progress Notes (Signed)
Summary: meds prob  Phone Note Call from Patient Call back at Home Phone [phone]   Caller: Patient Summary of Call: insurance will not pay for [facility] Initial call taken by: De Nurse,  May 20, 2010 2:28 PM  Follow-up for Phone Call        told him I will send this to his md & call him with her response. told him it does not work for all people & perhaps md will order something different Follow-up by: Golden Circle RN,  May 20, 2010 2:34 PM  Additional Follow-up for Phone Call Additional follow up Details #1::        spoke with Dr. Janalyn Harder. she has not seen him & will defer to Dr. Earlene Plater who ordered this yesterday Additional Follow-up by: Golden Circle RN,  May 20, 2010 2:41 PM    Additional Follow-up for Phone Call Additional follow up Details #2::    Please tell the patient to try the OTC Robitussin with Dextromethorphan and Guaifenesin. Follow up next week if this is not helping. Follow-up by: Helane Rima DO,  May 20, 2010 3:19 PM  Additional Follow-up for Phone Call Additional follow up Details #3:: Details for Additional Follow-up Action Taken: gave pt the message above. Additional Follow-up by: Golden Circle RN,  May 20, 2010 3:26 PM  New/Updated Medications: ROBITUSSIN TO GO [facility]/CHEST DM 100-10 MG/5ML LIQD (DEXTROMETHORPHAN-GUAIFENESIN)

## 2010-12-23 ENCOUNTER — Emergency Department (HOSPITAL_COMMUNITY)
Admission: EM | Admit: 2010-12-23 | Discharge: 2010-12-23 | Disposition: A | Discharge status: Home / Self Care | Payer: MEDICARE | Attending: Emergency Medicine | Admitting: Emergency Medicine

## 2010-12-23 DIAGNOSIS — K089 Disorder of teeth and supporting structures, unspecified: Secondary | ICD-10-CM | POA: Insufficient documentation

## 2010-12-23 DIAGNOSIS — X58XXXA Exposure to other specified factors, initial encounter: Secondary | ICD-10-CM | POA: Insufficient documentation

## 2010-12-23 DIAGNOSIS — S025XXA Fracture of tooth (traumatic), initial encounter for closed fracture: Secondary | ICD-10-CM | POA: Insufficient documentation

## 2010-12-23 DIAGNOSIS — K029 Dental caries, unspecified: Secondary | ICD-10-CM | POA: Insufficient documentation

## 2010-12-23 DIAGNOSIS — I10 Essential (primary) hypertension: Secondary | ICD-10-CM | POA: Insufficient documentation

## 2010-12-23 NOTE — Assessment & Plan Note (Signed)
Summary: Hospital Admission H&P   Primary Provider:  Cat Ta MD  CC:  SOB and wheezing.  History of Present Illness: This is a 55 yr old male with PMH COPD, asthma, and HTN who p/w SOB, wheezing, productive cough x7 days.  Symptoms started 12/21 and have been getting worse.  Pt called into clinic on 12/23 and started on Prednisone burst, but patient did not take it properly.  States that he has been using his Advair and Albuterol as directed.  Came to hospital today b/c he was not getting any better.  In ED, patient received 1 hr albuterol treatments and seemed to have improved.  His BP came down, breathing improved, but when he got up to go home, he became dyspneic again with wheezing.    ROS: productive cough, SOB, chest tightness.  Denies fever, chills, NS, CP, N/V.  Denies dysuria, abdominal pain, constipation/diarrhea, hemoptysis, or palpitations.    Current Problems (verified): 1)  Hypertension, Benign Systemic  (ICD-401.1) 2)  COPD  (ICD-496) 3)  Erectile Dysfunction  (ICD-302.72) 4)  Colon Polyp  (ICD-211.3) 5)  Tobacco User  (ICD-305.1)  Current Medications (verified): 1)  Advair Diskus 500-50 Mcg/dose  Misc (Fluticasone-Salmeterol) .... One Inhalation Twice Daily As Directed 2)  Amlodipine Besylate 10 Mg Tabs (Amlodipine Besylate) .Marland Kitchen.. 1 Tab By Mouth Daily For Blood Pressure. 3)  Proventil Hfa 108 (90 Base) Mcg/act Aers (Albuterol Sulfate) .... 2 Puffs Every 4-6 Hours As Needed Wheezing or Shortness of Breath. 4)  Allegra Allergy 180 Mg Tabs (Fexofenadine Hcl) .Marland Kitchen.. 1 Tab By Mouth Daily 5)  Aspirin 81 Mg Tbec (Aspirin) .Marland Kitchen.. 1 Tab By Mouth Daily 6)  Prednisone 20 Mg Tabs (Prednisone) .... 3 By Mouth Daily X 5 Days  Allergies (verified): 1)  ! * Spiriva  Past History:  Past Medical History: Last updated: 10/12/2010 COPD exac admit 10/2009: required step down bed for bipap, no intubation COPD- intubated twice! Hypertension Colonoscopy by Dr. Elnoria Howard in 2007 showed colon  polyps (adenomatous and hyperplastic).  Rpt colonoscopy in 5 yrs. PFT (9/08)-> severe obstruction with positive response to bronchodilator  ***Medical Noncompliant  Past Surgical History: Last updated: 12/22/2008 none  Family History: Reviewed history from 10/12/2010 and no changes required. Mother: has asthma. Now age 43 with Alhzeimer Father: HTN, bladder cancer, heart disease  Social History: Reviewed history from 10/12/2010 and no changes required. On Disability secondary to lung disease. 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  Born and raised in [place].  Education-through 9th grade.  Smoked 1/3 to 1/2 ppd for 30 years, quit in 2009.    Used to drink heavily, now on occasion.  No drugs. Pt does report smoking occasionally (updated 10/20/2009), quit for a short time, but now smoking 2 cig/day.  Review of Systems       per HPI  Physical Exam  General:  in no acute distress; alert,appropriate and cooperative throughout examination Eyes:  EOMI. Perrla.  Mouth:  pharynx pink and moist and poor dentition.   Neck:  No deformities, masses, or tenderness noted. Lungs:  Diffuse wheezies bilaterally on expiration.  Few, scattered crackles at bil bases.  Positive intercostal retractions and accessory muscle use. Heart:  Normal rate and regular rhythm. S1 and S2 normal without gallop, murmur, click, rub or other extra sounds. Abdomen:  Bowel sounds positive,abdomen soft and non-tender without masses. Pulses:  dorsalis pedis and posterior tibial pulses are full and equal bilaterally Extremities:  No clubbing,  cyanosis, edema, or deformity noted with normal full range of motion of all joints.   Neurologic:  No cranial nerve deficits noted.  Sensory, motor and coordinative functions appear intact. Additional Exam:  Vitals: T 97.8, BP 138/80, P 114, RR 22, O2 sat 97% on 0.5 L  CXR: 12/28 No acute disease.  Stable compared to prior exam.   TCO2                                     24                 Ionized Calcium                     1.16                Hemoglobin (HGB)                17.0                Hematocrit (HCT)                  50.0                Sodium (NA)                         144                Potassium (K)                       3.5                Chloride                                 111                 Glucose                                144            BUN                                       13                 Creatinine                              1.0                POC CE: NEG     Impression & Recommendations:  Problem # 1:  Acute COPD exacerbation Pt has had multiple admissions in the past and has been intubated twice.  Will admit to [facility]PT[facility] to step down unit.  Will start Albuterol 2.4 q2/q1 as needed and space per RT.  Will give a load of Solumedrol 125 IV once now and then taper to 60mg  IV daily.  Will start Doxycycline 100mg  by mouth two times a day and continue Allegra by mouth daily.  Will order ABG now, CBC and CMET in am.  May  consider adding Mucinex if cough does not improve.  Will monitor respiratory status.  Problem # 2:  HYPERTENSION, BENIGN SYSTEMIC (ICD-401.1) BP was elevated initially on admission, but has improved.  Will continue home meds of Amlodipine 10 by mouth daily.   His updated medication list for this problem includes:    Amlodipine Besylate 10 Mg Tabs (Amlodipine besylate) .Marland Kitchen... 1 tab by mouth daily for blood pressure.  Problem # 3:  FEN/GI HH diet.  IVF: Hep lock  Problem # 4:  PPX Heparin 5000 units Subcutaneously three times a day for DVT PPx.  Protonix 40 mg 1 tab by mouth daily for GI PPX.  Problem # 5:  Disposition Pending clinical improvement.  Complete Medication List: 1)  Advair Diskus 500-50 Mcg/dose Misc (Fluticasone-salmeterol) .... One inhalation twice daily as directed 2)  Amlodipine Besylate 10 Mg Tabs (Amlodipine besylate) .Marland Kitchen.. 1 tab by mouth daily for blood  pressure. 3)  Proventil Hfa 108 (90 Base) Mcg/act Aers (Albuterol sulfate) .... 2 puffs every 4-6 hours as needed wheezing or shortness of breath. 4)  Allegra Allergy 180 Mg Tabs (Fexofenadine hcl) .Marland Kitchen.. 1 tab by mouth daily 5)  Aspirin 81 Mg Tbec (Aspirin) .Marland Kitchen.. 1 tab by mouth daily 6)  Prednisone 20 Mg Tabs (Prednisone) .... 3 by mouth daily x 5 days

## 2010-12-23 NOTE — Assessment & Plan Note (Signed)
Summary: HTN, COPD   Vital Signs:  Patient profile:   55 year old male Height:      68.25 inches Weight:      142 pounds BMI:     21.51 O2 Sat:      99 % on Room air Pulse rate:   86 / minute BP sitting:   129 / 86  (left arm) Cuff size:   regular  Vitals Entered By: Tessie Fass CMA (November 10, 2010 10:24 AM)  O2 Flow:  Room air CC: dyspnea, Hypertension Management Pain Assessment Patient in pain? no        Primary Care Provider:  Cat Ta MD  CC:  dyspnea and Hypertension Management.  History of Present Illness: 55 y/o M  here for HTN f/u   COUGH and DYSPNEA Onset: cough and short of breath x a few days Modifying factors:  History of COPD that required intubation in the past.  He states that he missed about 3 days of Claritin. Then brought Allegra yesterday and took it this morning.  He feels much beter right now.  Thinks that he has a cold.    Symptoms Productive: yes Wheezing: yes Dyspnea: yes Nasal discharge: yes, improved since he took Allegra Fever: no Sore throat: no Sick contacts: no Heartburn symptoms:  no History of Asthma: yes  Red Flags  Weight loss: 3 lbs since Nov Hemoptysis: no Edema: no     Hypertension History:      He complains of dyspnea with exertion, but denies headache, chest pain, palpitations, orthopnea, PND, peripheral edema, neurologic problems, syncope, and side effects from treatment.  He notes no problems with any antihypertensive medication side effects.  Further comments include: Dyspnea is new in the past few days, along with cough .        Positive major cardiovascular risk factors include male age 13 years old or older and hypertension.  Negative major cardiovascular risk factors include non-tobacco-user status.        Further assessment for target organ damage reveals no history of ASHD, cardiac end-organ damage (CHF/LVH), stroke/TIA, peripheral vascular disease, renal insufficiency, or hypertensive retinopathy.      Allergies: 1)  ! * Spiriva  Past History:  Past Medical History: Last updated: 10/12/2010 COPD exac admit 10/2009: required step down bed for bipap, no intubation COPD- intubated twice! Hypertension Colonoscopy by Dr. Elnoria Howard in 2007 showed colon polyps (adenomatous and hyperplastic).  Rpt colonoscopy in 5 yrs. PFT (9/08)-> severe obstruction with positive response to bronchodilator  ***Medical Noncompliant  Past Surgical History: Last updated: 12/22/2008 none  Family History: Last updated: 10/12/2010 Mother: has asthma. Now age 57 with [place] Father: HTN, bladder cancer  Social History: Last updated: 10/12/2010 On Disability secondary to lung disease. 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  Born and raised in [place].  Education-through 9th grade.  Smoked 1/3 to 1/2 ppd for 30 years, quit in 2009.    Used to drink heavily, none now.  No drugs. Pt does report smoking occasionally (updated 10/20/2009), but now has quit.   Risk Factors: Smoking Status: quit (10/12/2010) Passive Smoke Exposure: no (12/02/2008)  Review of Systems       per hpi   Physical Exam  General:  Well-developed,well-nourished,in no acute distress; alert,appropriate and cooperative throughout examination. Vitals reviewed.  Head:  normocephalic and atraumatic.   Mouth:  Oral mucosa and oropharynx without lesions or exudates. Neck:  No deformities, masses, or tenderness  noted. Lungs:  Normal respiratory effort, chest expands symmetrically. Lungs are clear to auscultation, no crackles or wheezes. Heart:  Normal rate and regular rhythm. S1 and S2 normal without gallop, murmur, click, rub or other extra sounds. No bruit  Abdomen:  Bowel sounds positive,abdomen soft and non-tender without masses, organomegaly or hernias noted. Pulses:  +2 bilaterally  Extremities:  No clubbing, cyanosis, edema, or deformity noted with normal full range of motion of all joints.    Neurologic:  alert & oriented X3.   Cervical Nodes:  No lymphadenopathy noted   Impression & Recommendations:  Problem # 1:  HYPERTENSION, BENIGN SYSTEMIC (ICD-401.1) Assessment Improved  BP today 129/86 and almost at goal of 130/80.  Pt with history of noncompliance in past.  He seems to be doing better now.  Will continue current med.  f/u in 1 month.  His updated medication list for this problem includes:    Amlodipine Besylate 10 Mg Tabs (Amlodipine besylate) .Marland Kitchen... 1 tab by mouth daily for blood pressure.  Orders: [facility]- Est Level  3 ([address]109)  Problem # 2:  COPD (ICD-496)  Pt presents with some cough and feelings of shortness of breath.  States that he takes Advair as directed and use albuterol as needed.  He missed Claritin/Alegra x 3 days and resumed it this morning.  Feeling better.   Lung exam cta.  NO fever/chills.   He has history of COPD exacerbation requiring intubation x 2.  I do not feel that he is having flare today (O2 sat 99% on RA).  I've instructed pt to call MD on call if he feels worse this weekend.  Will not hestiate to start pt on Prednisone for short course over the weekend and pt to f/u with me next week.  Pt agreeable to this plan.    His updated medication list for this problem includes:    Advair Diskus 500-50 Mcg/dose Misc (Fluticasone-salmeterol) ..... One inhalation twice daily as directed    Proventil Hfa 108 (90 Base) Mcg/act Aers (Albuterol sulfate) .Marland Kitchen... 2 puffs every 4-6 hours as needed wheezing or shortness of breath.  Orders: [facility]- Est Level  3 (60454)  Complete Medication List: 1)  Advair Diskus 500-50 Mcg/dose Misc (Fluticasone-salmeterol) .... One inhalation twice daily as directed 2)  Amlodipine Besylate 10 Mg Tabs (Amlodipine besylate) .Marland Kitchen.. 1 tab by mouth daily for blood pressure. 3)  Proventil Hfa 108 (90 Base) Mcg/act Aers (Albuterol sulfate) .... 2 puffs every 4-6 hours as needed wheezing or shortness of breath. 4)  Allegra Allergy 180 Mg  Tabs (Fexofenadine hcl) .Marland Kitchen.. 1 tab by mouth daily 5)  Aspirin 81 Mg Tbec (Aspirin) .Marland Kitchen.. 1 tab by mouth daily  Hypertension Assessment/Plan:      The patient's hypertensive risk group is category B: At least one risk factor (excluding diabetes) with no target organ damage.  His calculated 10 year risk of coronary heart disease is 11 %.  Today's blood pressure is 129/86.  His blood pressure goal is < 130/80.  Patient Instructions: 1)  Please schedule a follow-up appointment in 1 month for blood pressure and breathing. 2)  If you feel worse with breathing and wheezing and short of breath, call the 24-hr number.  I will leave instructions for Doctor on Call to prescribe Prednisone. 3)  If you get a prescription for prednisone this weekend, I would like to see you in clinic early next week.   Prescriptions: PROVENTIL HFA 108 (90 BASE) MCG/ACT AERS (ALBUTEROL SULFATE) 2 puffs every  4-6 hours as needed wheezing or shortness of breath.  #1 x 3   Entered and Authorized by:   Angeline Slim MD   Signed by:   Angeline Slim MD on 11/10/2010   Method used:   [facility]ronically to        [facility] [address] [phone]* (retail)       [address]       [place], [place]  [address]       Ph: [phone] or [phone]       Fax: [phone]   RxID:   [id number] ADVAIR DISKUS 500-50 MCG/DOSE  MISC (FLUTICASONE-SALMETEROL) One inhalation twice daily as directed  #60 Each x 3   Entered and Authorized by:   Angeline Slim MD   Signed by:   Angeline Slim MD on 11/10/2010   Method used:   [facility]        [facility] [address] [phone]* (retail)       [address]       [place], [place]  [address]       Ph: [phone] or [phone]       Fax: [phone]   RxID:   [id number] AMLODIPINE BESYLATE 10 MG TABS (AMLODIPINE BESYLATE) 1 tab by mouth daily for blood pressure.  #34 Tablet x 6   Entered and Authorized by:   Angeline Slim MD   Signed by:   Angeline Slim MD on 11/10/2010   Method used:   [facility]        [facility] [address] [phone]* (retail)       [address]       [place], [place]  [address]       Ph: [phone] or [phone]       Fax: [phone]   RxID:   [id number]    Orders Added: 1)  [facility]- Est Level  3 [01751]

## 2010-12-23 NOTE — Assessment & Plan Note (Signed)
Summary: hospital f/u COPD   Vital Signs:  Patient profile:   55 year old male Height:      68.25 inches Weight:      149 pounds BMI:     22.57 Temp:     98.0 degrees F oral Pulse rate:   76 / minute BP sitting:   138 / 81  (left arm) Cuff size:   regular  Vitals Entered By: Tessie Fass CMA (November 30, 2010 3:22 PM) CC: hospital f/u Is Patient Diabetic? No Pain Assessment Patient in pain? no        Primary Care Provider:  Glyn Zendejas MD  CC:  hospital f/u.  History of Present Illness: 55 y/o M with HTN, COPD is here for   [facility] f/u for COPD exacerbation.  Pt with history of COPD exac requring intubation x 2.  This time he required CPAP while hospitalization but was weaned of to RA.  He finished Avelox and short course of Prednisone.   He feels much better.  He has gained weight since last visit.  Denies fever, chills, cough, dyspena.   HYPERTENSION Disease Monitoring Blood pressure range: 120s/80s Medications: Norvasc 10mg   Compliance: yes  Lightheadedness: no Edema:no  Chest pain:no  Dyspnea:no  Syncope: No  Palpitations: no Prevention Exercise: no routine, but walks a lot Salt restriction:yes     Tobacco use: 55 year old male quit smoking 57 days ago.  When he feels the urge to smoke, he would eat instead.    Habits & Providers  Alcohol-Tobacco-Diet     Tobacco Status: quit  Current Medications (verified): 1)  Advair Diskus 500-50 Mcg/dose  Misc (Fluticasone-Salmeterol) .... One Inhalation Twice Daily As Directed 2)  Amlodipine Besylate 10 Mg Tabs (Amlodipine Besylate) .Marland Kitchen.. 1 Tab By Mouth Daily For Blood Pressure. 3)  Proventil Hfa 108 (90 Base) Mcg/act Aers (Albuterol Sulfate) .... 2 Puffs Every 4-6 Hours As Needed Wheezing or Shortness of Breath. 4)  Allegra Allergy 180 Mg Tabs (Fexofenadine Hcl) .Marland Kitchen.. 1 Tab By Mouth Daily 5)  Aspirin 81 Mg Tbec (Aspirin) .Marland Kitchen.. 1 Tab By Mouth Daily  Allergies (verified): 1)  ! * Spiriva  Past History:  Past Medical History: COPD exac admit  10/2009: required step down bed for bipap, no intubation COPD- intubated twice! Hypertension Colonoscopy by Dr. Elnoria Howard in 2007 showed colon polyps (adenomatous and hyperplastic).  Rpt colonoscopy in 5 yrs. PFT (9/08)-> severe obstruction with positive response to bronchodilator  ***Medical Noncompliant in past  Social History: On Disability secondary to lung disease. 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  Born and raised in [place].  Education-through 9th grade.  Smoked 1/3 to 1/2 ppd for 30 years, quit in 2009.  Quitting again 11/2010.    Used to drink heavily, now on occasion.  No drugs.  Code Status: Full code  Review of Systems       per hpi   Physical Exam  General:  Well-developed,well-nourished,in no acute distress; alert,appropriate and cooperative throughout examination. vitals reviewed.  Lungs:  Normal respiratory effort, chest expands symmetrically. Lungs are clear to auscultation, no crackles or wheezes. Heart:  Normal rate and regular rhythm. S1 and S2 normal without gallop, murmur, click, rub or other extra sounds. No bruit.  Abdomen:  Bowel sounds positive,abdomen soft and non-tender without masses, organomegaly or hernias noted. Pulses:  R radial normal, R dorsalis pedis normal, R carotid normal, L radial normal, L dorsalis pedis normal, and L carotid normal.   Extremities:  No edema  Neurologic:  alert & oriented X3.   Cervical Nodes:  No lymphadenopathy noted   Impression & Recommendations:  Problem # 1:  COPD (ICD-496) Assessment Improved Pt is improved since hosp.  Finished Avelox and Prednisone.  Taking Advair two times a day and have not had to use rescue inhaler.  Will cont current meds.    His updated medication list for this problem includes:    Advair Diskus 500-50 Mcg/dose Misc (Fluticasone-salmeterol) ..... One inhalation twice daily as directed    Proventil Hfa 108 (90 Base) Mcg/act Aers (Albuterol sulfate) .Marland Kitchen... 2  puffs every 4-6 hours as needed wheezing or shortness of breath.  Orders: [facility]- Est Level  3 (24401)  Problem # 2:  HYPERTENSION, BENIGN SYSTEMIC (ICD-401.1) Assessment: Unchanged BP  128/89 and at goal.  Will continue current med.    His updated medication list for this problem includes:    Amlodipine Besylate 10 Mg Tabs (Amlodipine besylate) .Marland Kitchen... 1 tab by mouth daily for blood pressure.  Orders: [facility]- Est Level  3 (02725)  Problem # 3:  TOBACCO USER (ICD-305.1) Assessment: Comment Only Pt has not smoked in 10 days!!!  He is eating if he has urge to smoke.  His weight can tolerate the weight gain.   Complete Medication List: 1)  Advair Diskus 500-50 Mcg/dose Misc (Fluticasone-salmeterol) .... One inhalation twice daily as directed 2)  Amlodipine Besylate 10 Mg Tabs (Amlodipine besylate) .Marland Kitchen.. 1 tab by mouth daily for blood pressure. 3)  Proventil Hfa 108 (90 Base) Mcg/act Aers (Albuterol sulfate) .... 2 puffs every 4-6 hours as needed wheezing or shortness of breath. 4)  Allegra Allergy 180 Mg Tabs (Fexofenadine hcl) .Marland Kitchen.. 1 tab by mouth daily 5)  Aspirin 81 Mg Tbec (Aspirin) .Marland Kitchen.. 1 tab by mouth daily  Patient Instructions: 1)  Please schedule a follow-up appointment in 2 months for routine exam (blood pressure, copd. 2)  Congratulation on quiting smoking.  We are all very proud of you.    3)  Continue all your medications as directed.  Prescriptions: PROVENTIL HFA 108 (90 BASE) MCG/ACT AERS (ALBUTEROL SULFATE) 2 puffs every 4-6 hours as needed wheezing or shortness of breath.  #1 x 3   Entered and Authorized by:   Angeline Slim MD   Signed by:   Angeline Slim MD on 11/30/2010   Method used:   [facility]        [facility]  [address] [phone][facility] (retail)       [address]       [place], [place]  [address]       Ph: [phone] or [phone]       Fax: [phone]   RxID:   [id number] AMLODIPINE BESYLATE 10 MG TABS (AMLODIPINE BESYLATE) 1 tab by mouth daily for blood pressure.   #34 x 6   Entered and Authorized by:   Angeline Slim MD   Signed by:   Angeline Slim MD on 11/30/2010   Method used:   [facility]        [facility] [address] [phone]* (retail)       [address]       [place], [place]  [address]       Ph: [phone] or [phone]       Fax: [phone]   RxID:   [id number] ADVAIR DISKUS 500-50 MCG/DOSE  MISC (FLUTICASONE-SALMETEROL) One inhalation twice daily as directed  #60 Each x 6   Entered and Authorized by:  Raniyah Curenton MD   Signed by:   Angeline Slim MD on 11/30/2010   Method used:   [facility]        [facility]  [address] [phone]* (retail)       [address]       [place], [place]  [address]       Ph: [phone] or [phone]       Fax: [phone]   RxID:   [id number]    Orders Added: 1)  [facility]- Est Level  3 [[address]38]     Prevention & Chronic Care Immunizations   Influenza vaccine: Fluvax MCR  (12/02/2008)   Influenza vaccine deferral: Not indicated  (11/02/2009)   Influenza vaccine due: 10/21/2010    Tetanus booster: 01/19/2006: Done.   Tetanus booster due: 01/20/2016    Pneumococcal vaccine: Done.  (08/21/2004)   Pneumococcal vaccine due: None  Colorectal Screening   Hemoccult: Done.  (01/19/2006)   Hemoccult due: Not Indicated    Colonoscopy: polyps (Dr. Elnoria Howard)  (06/02/2006)   Colonoscopy due: 06/03/2011  Other Screening   PSA: 0.38  (10/12/2010)   PSA action/deferral: PSA ordered  (04/23/2007)   PSA due due: 04/26/2008   Smoking status: quit  (11/30/2010)  Lipids   Total Cholesterol: 171  (04/17/2009)   LDL: 108  (04/17/2009)   LDL Direct: Not documented   HDL: 49  (04/17/2009)   Triglycerides: 68  (04/17/2009)  Hypertension   Last Blood Pressure: 138 / 81  (11/30/2010)   Serum creatinine: 1.03  (10/12/2010)   BMP action: Ordered   Serum potassium 4.4  (10/12/2010)    Hypertension flowsheet reviewed?: Yes   Progress toward BP goal: At goal  Self-Management Support :   Personal Goals (by the  next clinic visit) :      Personal blood pressure goal: 140/90  (05/19/2010)   Hypertension self-management support: Written self-care plan  (05/19/2010)

## 2010-12-23 NOTE — Progress Notes (Signed)
Summary: EMERGENCY LINE CALL       New/Updated Medications: PREDNISONE 20 MG TABS (PREDNISONE) 3 by mouth daily x 5 days Prescriptions: PREDNISONE 20 MG TABS (PREDNISONE) 3 by mouth daily x 5 days  #15 x 0   Entered and Authorized by:   Helane Rima DO   Signed by:   Helane Rima DO on 11/12/2010   Method used:   [facility]        [facility]  [address] [phone]* (retail)       [address]       [place], [place]  [address]       Ph: [phone] or [phone]       Fax: [phone]   RxID:   [id number]   Patient called for Rx Prednisone. Read PCP note from 12/21 - stated if patient not improving, to call on-call MD for Rx prednisone. Patient with no worsening of s/s, only not getting better, otherwise neg ROS. Will call in burst. Patient to f/u with Dr. Janalyn Harder next week. Helane Rima DO  November 12, 2010 10:13 AM

## 2011-01-14 ENCOUNTER — Emergency Department (HOSPITAL_COMMUNITY)
Admission: EM | Admit: 2011-01-14 | Discharge: 2011-01-14 | Disposition: A | Discharge status: Home / Self Care | Payer: MEDICARE | Attending: Emergency Medicine | Admitting: Emergency Medicine

## 2011-01-14 DIAGNOSIS — K0381 Cracked tooth: Secondary | ICD-10-CM | POA: Insufficient documentation

## 2011-01-14 DIAGNOSIS — J449 Chronic obstructive pulmonary disease, unspecified: Secondary | ICD-10-CM | POA: Insufficient documentation

## 2011-01-14 DIAGNOSIS — J4489 Other specified chronic obstructive pulmonary disease: Secondary | ICD-10-CM | POA: Insufficient documentation

## 2011-01-14 DIAGNOSIS — I10 Essential (primary) hypertension: Secondary | ICD-10-CM | POA: Insufficient documentation

## 2011-01-14 DIAGNOSIS — K089 Disorder of teeth and supporting structures, unspecified: Secondary | ICD-10-CM | POA: Insufficient documentation

## 2011-01-14 DIAGNOSIS — K029 Dental caries, unspecified: Secondary | ICD-10-CM | POA: Insufficient documentation

## 2011-01-31 LAB — COMPREHENSIVE METABOLIC PANEL
AST: 24 U/L (ref 0–37)
Albumin: 3.4 g/dL — ABNORMAL LOW (ref 3.5–5.2)
BUN: 14 mg/dL (ref 6–23)
Calcium: 9.3 mg/dL (ref 8.4–10.5)
Creatinine, Ser: 1.15 mg/dL (ref 0.4–1.5)
GFR calc Af Amer: >60 mL/min (ref >60)
Total Protein: 6.5 g/dL (ref 6.0–8.3)

## 2011-01-31 LAB — DIFFERENTIAL
Basophils Absolute: 0 10*3/uL (ref 0.0–0.1)
Basophils Relative: 0 % (ref 0–1)
Eosinophils Absolute: 0 10*3/uL (ref 0.0–0.7)
Monocytes Relative: 4 % (ref 3–12)
Neutro Abs: 14.3 10*3/uL — ABNORMAL HIGH (ref 1.7–7.7)
Neutrophils Relative %: 83 % — ABNORMAL HIGH (ref 43–77)

## 2011-01-31 LAB — POCT I-STAT, CHEM 8
Chloride: 111 mEq/L (ref 96–112)
HCT: 50 % (ref 39.0–52.0)
Hemoglobin: 17 g/dL (ref 13.0–17.0)
Potassium: 3.5 mEq/L (ref 3.5–5.1)
Sodium: 144 mEq/L (ref 135–145)

## 2011-01-31 LAB — POCT I-STAT 3, ART BLOOD GAS (G3+)
Acid-base deficit: 6 mmol/L — ABNORMAL HIGH (ref 0.0–2.0)
O2 Saturation: 94 %
pCO2 arterial: 31.3 mmHg — ABNORMAL LOW (ref 35.0–45.0)
pO2, Arterial: 74 mmHg — ABNORMAL LOW (ref 80.0–100.0)

## 2011-01-31 LAB — CBC
MCH: 31.6 pg (ref 26.0–34.0)
MCH: 31.8 pg (ref 26.0–34.0)
MCHC: 33.4 g/dL (ref 30.0–36.0)
MCV: 95 fL (ref 78.0–100.0)
Platelets: 274 10*3/uL (ref 150–400)
RBC: 4.37 MIL/uL (ref 4.22–5.81)
RDW: 12.9 % (ref 11.5–15.5)
RDW: 13.1 % (ref 11.5–15.5)
WBC: 20.4 10*3/uL — ABNORMAL HIGH (ref 4.0–10.5)

## 2011-01-31 LAB — POCT CARDIAC MARKERS
CKMB, poc: 1.6 ng/mL (ref 1.0–8.0)
Myoglobin, poc: 62.8 ng/mL (ref 12–200)
Troponin i, poc: <0.05 ng/mL (ref 0.00–0.09)

## 2011-02-01 LAB — POCT I-STAT, CHEM 8
Calcium, Ion: 1.14 mmol/L (ref 1.12–1.32)
Creatinine, Ser: 1.1 mg/dL (ref 0.4–1.5)
Glucose, Bld: 62 mg/dL — ABNORMAL LOW (ref 70–99)
Hemoglobin: 16 g/dL (ref 13.0–17.0)
Potassium: 3.6 mEq/L (ref 3.5–5.1)

## 2011-02-01 LAB — DIFFERENTIAL
Basophils Relative: 1 % (ref 0–1)
Eosinophils Absolute: 0.8 10*3/uL — ABNORMAL HIGH (ref 0.0–0.7)
Eosinophils Relative: 11 % — ABNORMAL HIGH (ref 0–5)
Lymphs Abs: 3.1 10*3/uL (ref 0.7–4.0)
Monocytes Absolute: 0.8 10*3/uL (ref 0.1–1.0)
Monocytes Relative: 11 % (ref 3–12)
Neutrophils Relative %: 35 % — ABNORMAL LOW (ref 43–77)

## 2011-02-01 LAB — CBC
HCT: 42.9 % (ref 39.0–52.0)
Hemoglobin: 14.1 g/dL (ref 13.0–17.0)
MCH: 31.8 pg (ref 26.0–34.0)
MCHC: 32.9 g/dL (ref 30.0–36.0)
MCV: 96.6 fL (ref 78.0–100.0)
RBC: 4.44 MIL/uL (ref 4.22–5.81)

## 2011-02-01 LAB — POCT CARDIAC MARKERS: CKMB, poc: <1 ng/mL — ABNORMAL LOW (ref 1.0–8.0)

## 2011-02-04 LAB — POCT I-STAT, CHEM 8
BUN: 15 mg/dL (ref 6–23)
Chloride: 109 mEq/L (ref 96–112)
Glucose, Bld: 81 mg/dL (ref 70–99)
HCT: 45 % (ref 39.0–52.0)
Potassium: 3.5 mEq/L (ref 3.5–5.1)

## 2011-02-04 LAB — POCT CARDIAC MARKERS
CKMB, poc: <1 ng/mL — ABNORMAL LOW (ref 1.0–8.0)
Troponin i, poc: <0.05 ng/mL (ref 0.00–0.09)

## 2011-02-22 LAB — BASIC METABOLIC PANEL
BUN: 15 mg/dL (ref 6–23)
Creatinine, Ser: 1.2 mg/dL (ref 0.4–1.5)
GFR calc Af Amer: >60 mL/min (ref >60)
GFR calc non Af Amer: >60 mL/min (ref >60)
Potassium: 4.1 mEq/L (ref 3.5–5.1)

## 2011-02-22 LAB — CBC
Platelets: 227 10*3/uL (ref 150–400)
WBC: 9.5 10*3/uL (ref 4.0–10.5)

## 2011-02-23 LAB — CBC
HCT: 43.5 % (ref 39.0–52.0)
Hemoglobin: 14.8 g/dL (ref 13.0–17.0)
MCV: 96.8 fL (ref 78.0–100.0)
RBC: 4.49 MIL/uL (ref 4.22–5.81)
WBC: 7.4 10*3/uL (ref 4.0–10.5)

## 2011-02-23 LAB — POCT CARDIAC MARKERS: Myoglobin, poc: 59.8 ng/mL (ref 12–200)

## 2011-02-23 LAB — DIFFERENTIAL
Eosinophils Absolute: 0.9 10*3/uL — ABNORMAL HIGH (ref 0.0–0.7)
Eosinophils Relative: 12 % — ABNORMAL HIGH (ref 0–5)
Lymphs Abs: 3.8 10*3/uL (ref 0.7–4.0)
Monocytes Relative: 10 % (ref 3–12)
Neutrophils Relative %: 26 % — ABNORMAL LOW (ref 43–77)

## 2011-02-23 LAB — BASIC METABOLIC PANEL
Chloride: 107 mEq/L (ref 96–112)
GFR calc Af Amer: >60 mL/min (ref >60)
Potassium: 4 mEq/L (ref 3.5–5.1)
Sodium: 140 mEq/L (ref 135–145)

## 2011-03-08 ENCOUNTER — Other Ambulatory Visit: Payer: Self-pay | Admitting: Family Medicine

## 2011-03-08 LAB — BASIC METABOLIC PANEL
Calcium: 9.3 mg/dL (ref 8.4–10.5)
GFR calc Af Amer: >60 mL/min (ref >60)
GFR calc non Af Amer: 58 mL/min — ABNORMAL LOW (ref >60)
Glucose, Bld: 149 mg/dL — ABNORMAL HIGH (ref 70–99)
Sodium: 137 mEq/L (ref 135–145)

## 2011-03-08 LAB — CBC
HCT: 40.6 % (ref 39.0–52.0)
Hemoglobin: 13.7 g/dL (ref 13.0–17.0)
MCHC: 33.8 g/dL (ref 30.0–36.0)
MCV: 95.5 fL (ref 78.0–100.0)
RBC: 4.25 MIL/uL (ref 4.22–5.81)
WBC: 8.2 10*3/uL (ref 4.0–10.5)

## 2011-03-08 LAB — POCT I-STAT, CHEM 8
Calcium, Ion: 1.1 mmol/L — ABNORMAL LOW (ref 1.12–1.32)
Creatinine, Ser: 1.4 mg/dL (ref 0.4–1.5)
Glucose, Bld: 91 mg/dL (ref 70–99)
Hemoglobin: 15.3 g/dL (ref 13.0–17.0)
TCO2: 20 mmol/L (ref 0–100)

## 2011-03-08 LAB — DIFFERENTIAL
Basophils Relative: 0 % (ref 0–1)
Eosinophils Absolute: 0.7 10*3/uL (ref 0.0–0.7)
Eosinophils Relative: 8 % — ABNORMAL HIGH (ref 0–5)
Lymphs Abs: 2.4 10*3/uL (ref 0.7–4.0)
Monocytes Relative: 7 % (ref 3–12)

## 2011-03-08 LAB — POCT CARDIAC MARKERS
CKMB, poc: 1.7 ng/mL (ref 1.0–8.0)
Myoglobin, poc: 57.1 ng/mL (ref 12–200)
Troponin i, poc: <0.05 ng/mL (ref 0.00–0.09)

## 2011-03-08 LAB — POCT I-STAT 3, ART BLOOD GAS (G3+)
Acid-base deficit: 1 mmol/L (ref 0.0–2.0)
Bicarbonate: 23.4 mEq/L (ref 20.0–24.0)
O2 Saturation: 94 %
TCO2: 24 mmol/L (ref 0–100)
pCO2 arterial: 36.7 mmHg (ref 35.0–45.0)
pO2, Arterial: 69 mmHg — ABNORMAL LOW (ref 80.0–100.0)

## 2011-03-08 NOTE — Telephone Encounter (Signed)
Refill request

## 2011-03-09 ENCOUNTER — Other Ambulatory Visit: Payer: Self-pay | Admitting: Family Medicine

## 2011-03-09 NOTE — Telephone Encounter (Signed)
Refill request

## 2011-03-25 DIAGNOSIS — J449 Chronic obstructive pulmonary disease, unspecified: Secondary | ICD-10-CM | POA: Insufficient documentation

## 2011-03-25 DIAGNOSIS — K089 Disorder of teeth and supporting structures, unspecified: Secondary | ICD-10-CM | POA: Insufficient documentation

## 2011-03-25 DIAGNOSIS — Z79899 Other long term (current) drug therapy: Secondary | ICD-10-CM | POA: Insufficient documentation

## 2011-03-25 DIAGNOSIS — I10 Essential (primary) hypertension: Secondary | ICD-10-CM | POA: Insufficient documentation

## 2011-03-25 DIAGNOSIS — J4489 Other specified chronic obstructive pulmonary disease: Secondary | ICD-10-CM | POA: Insufficient documentation

## 2011-03-25 DIAGNOSIS — K029 Dental caries, unspecified: Secondary | ICD-10-CM | POA: Insufficient documentation

## 2011-03-25 DIAGNOSIS — K047 Periapical abscess without sinus: Secondary | ICD-10-CM | POA: Insufficient documentation

## 2011-03-26 ENCOUNTER — Emergency Department (HOSPITAL_COMMUNITY)
Admission: EM | Admit: 2011-03-26 | Discharge: 2011-03-26 | Disposition: A | Discharge status: Home / Self Care | Payer: MEDICARE | Attending: Emergency Medicine | Admitting: Emergency Medicine

## 2011-04-05 NOTE — Discharge Summary (Signed)
NAMEHAROON, [name]               ACCOUNT NO.:  [id number]   MEDICAL RECORD NO.:  [id number]          PATIENT TYPE:  OBV   LOCATION:  5025                         FACILITY:  [facility]   PHYSICIAN:  Wayne A. Sheffield Slider, M.D.    DATE OF BIRTH:  03-Feb-1956   DATE OF ADMISSION:  12/22/2008  DATE OF DISCHARGE:  12/24/2008                               DISCHARGE SUMMARY   DISCHARGE DIAGNOSES:  1. Asthma exacerbation.  2. Chronic obstructive pulmonary disease.  3. Hypertension.  4. Remote history of tobacco abuse.   DISCHARGE MEDICATIONS:  1. Albuterol 2 puffs inhale every 4 hours as needed for wheezing.  2. Norvasc 5 mg p.o. daily.  3. Azithromycin 250 mg p.o. daily x2 days, prescription given.  4. Advair 500/50 one puff inhale twice daily.  5. Claritin 10 mg p.o. daily.  6. Protonix 40 mg p.o. twice daily while taking prednisone.  7. Prednisone 40 mg p.o. twice daily x7 days for a total of 10 days,      prescription given.  8. Trazodone 25 mg 1 tablet p.o. 3 times daily p.r.n. anxiety,      prescription given.   CONSULTANTS:  None.   LABORATORY DATA AND PROCEDURES:  Chest x-ray on December 22, 2008, did  not show infiltrate.   BRIEF HOSPITAL COURSE:  This is a 55 year old male with asthma  exacerbation versus COPD exacerbation.  1. Asthma exacerbation:  The patient has a history of asthma with      triggers of cold weather and colds.  The patient has been shoveling      snow 2 days prior to admission and on the day of admission, has      been wheezing with tachypnea.  The patient was admitted with      albuterol/Atrovent neb treatment q.4 h./q.2 h. p.r.n. wheezing.  He      initially required q.2 h. neb treatment, which was then weaned to      q.4 h. overnight.  Prior to discharge, the patient had been on      albuterol treatment q.4 h. and has been comfortable and without      tachypnea on exam.  Although on exam his lungs did show some      wheezing, he was comfortable and was not  tachypneic.  The patient      also required intermittent oxygen during this hospitalization, most      often with the tachypnea and increased work of breathing and      wheezing, but prior to discharge, he had O2 saturation between 93-      99% on room air.  The patient is discharged home to continue on      albuterol q.4 h. for the first 24 hours then q.4 h. as needed for      wheezing.  The patient is to continue on prednisone 40 mg p.o.      twice daily for an additional 7 days.   1. Chronic obstructive pulmonary disease:  The patient is to continue      the home dose of Advair 500/50 one  puff twice daily, also to      continue on Claritin 10 mg p.o. daily.   1. Hypertension:  The patient had blood pressure during this      hospitalization that was stable with systolic pressure in the 120s-      130s.  The patient is to continue on Norvasc 5 mg p.o. daily.   1. Although patient's chest x-ray did not show any infiltrates, he did      complain of some congestion and cough.  We had some concerns that      this asthma exacerbation was triggered by community-acquired      pneumonia and the patient was put on azithromycin with a loading      dose of 500 mg in the ED and 250 mg daily.  The patient is to have      a full 5-day course of azithromycin.   1. Anxiety:  The patient's wheezing and tachypnea does have some      anxiety aspect to it and with his respiratory conditions, we shy      away from using any kind of benzodiazepine.  The patient was tried      on a dose of trazodone 25 mg p.o. 3 times daily p.r.n. anxiety and      has done well with it overnight.  This prescription was given to      him on discharge, and this is something that his PCP can follow up      on as needed.   THINGS TO FOLLOW UP ON:  I do not know if the patient had a pulmonary  function test done.  It may be beneficial for the patient to be  scheduled by PCP for a PFT at the Pharmacy Clinic.   DISCHARGE  CONDITION:  Stable.   DISCHARGE INSTRUCTIONS:  Activity, no restrictions.  Diet, low-sodium  heart-healthy diet.  Wound care, not applicable.   FOLLOWUP APPOINTMENTS:  The patient is to have an appointment with his  PCP, Dr. Sylvan Cheese, on Wednesday, December 31, 2008, at 2:20.      Angeline Slim, MD  Electronically Signed      Arnette Norris. Sheffield Slider, M.D.  Electronically Signed    CT/MEDQ  D:  12/24/2008  T:  12/25/2008  Job:  [id number]   cc:   Sylvan Cheese, M.D.

## 2011-04-05 NOTE — H&P (Signed)
[name], [name]               ACCOUNT NO.:  [id number]   MEDICAL RECORD NO.:  [id number]          PATIENT TYPE:  OBV   LOCATION:  5025                         FACILITY:  [facility]   PHYSICIAN:  Wayne A. Sheffield Slider, M.D.    DATE OF BIRTH:  [birth year]   DATE OF ADMISSION:  12/22/2008  DATE OF DISCHARGE:  12/24/2008                              HISTORY & PHYSICAL   PRIMARY CARE [name]:  Sylvan Cheese, MD, of [facility].   CHIEF COMPLAINT:  Dyspnea.   HISTORY OF PRESENT ILLNESS:  The patient is a 55 year old male with  history of asthma and COPD, requiring intubation in the past, and remote  tobacco abuse, who presents with worsening dyspnea over last 3-4 days.  The patient states he began having URI symptoms including nonproductive  cough and rhinorrhea.  Denies fever, chills, productive cough, chest  pain, nausea, vomiting, palpitations, or diaphoresis.  Cold and cold  weather are known asthma triggers per the patient.  In the evening prior  to admission, he had worsening dyspnea and was unable to lie down after  having some snow.  No relief at home with albuterol nebs.  Subsequently,  he presented to the emergency department.  Received methylprednisolone,  ceftriaxone, azithromycin, 10 mg albuterol neb over 1 hour, and 2 mg  Atrovent neb over 1 hour with significant improvement in symptoms.  Family Practice was called to admit for observation.   PAST MEDICAL HISTORY:  1. COPD/asthma, require intubation twice in the last 5 years.  2. Hypertension.  3. Colonic polyps.   PAST SURGICAL HISTORY:  None.   MEDICATIONS:  1. Advair 500/50 one inhalation b.i.d.  2. Albuterol 2 puffs q.4 h. p.r.n. shortness of breath.  3. Allegra 180 mg p.o. daily.  4. Albuterol nebs t.i.d. to q.i.d. p.r.n. shortness of breath.  5. Norvasc 5 mg p.o. daily.   ALLERGIES:  No known drug allergies.   SOCIAL HISTORY:  The patient is on disability secondary to lung disease.  Married.  Born [place]  raised in [name]  Smoked half a pack a day for  30 years and quit in 2009.  Used to drink heavily, but denies doing so  now.  No drugs.   REVIEW OF SYSTEMS:  Negative for chills, fever, sweats, chest pain, or  productive sputum.  Positive for difficulty breathing at night,  difficulty breathing while lying down, and dyspnea on exertion.   PHYSICAL EXAMINATION:  VITAL SIGNS:  Oxygen saturation 98% on room air,  temperature 97.7, heart rate 110, respiratory 24, and blood pressure  142/86.  GENERAL:  The patient is in no apparent distress, sitting up in bed.  HEENT:  Pupils equal, round, and react to light and accommodation.  Extraocular movements are intact.  Sclerae clear.  Mild nasal congestion  with clear discharge.  Moist membranes.  NECK:  Supple with no thyromegaly or lymphadenopathy.  RESPIRATORY:  Lungs are clear to auscultation bilaterally without  wheezes, rales, or rhonchi.  The patient is able to speak in full  sentences.  Prolonged expiratory phase.  Normal work of breathing,  accessory  muscle use.  CARDIOVASCULAR:  Tachycardic.  Regular rhythm.  Normal S1 and S2 without  murmurs, rubs, or gallops.  ABDOMEN:  Soft, nontender, and nondistended with positive bowel sounds.  EXTREMITIES:  2+ radial pulses, no edema or cyanosis.  NEUROLOGIC:  Alert and oriented x3 with cranial nerves II through XII  grossly intact.  PSYCHOLOGIC:  Normally interactive, good eye contact, not anxious  appearing or depressed.   LABORATORIES:  1. Chest x-ray; hyperinflation and airway thickening.  No infiltrate.  2. Point-of-care cardiac markers negative x2.  3. ABG with pH 7.409, pCO2 of 36.79, and pO2 of 69.  Bicarb 23.4.      White blood cell count 8.2, hemoglobin 13.7, hematocrit 40.6, and      platelets 199.  Sodium 140, potassium 3.7, chloride 109, glucose      91, BUN 19, and creatinine 1.4.  4. EKG with sinus tachycardia, but no ST or T-wave abnormalities.   ASSESSMENT AND PLAN:   This is a 55 year old male with history of asthma/  chronic obstructive pulmonary disease, who presents with worsening  dyspnea.  1. Asthma versus chronic obstructive pulmonary disease exacerbation:      The patient is afebrile with normal white count, nonproductive      cough, stable on room air.  Per emergency room physician, the      patient is much improved following steroids and breathing      treatments.  We will observe overnight, given his intubation.  We      will continue prednisone 60 mg p.o. daily, albuterol and Atrovent      nebs.  We did not feel that the patient needs antibiotics at this      time, and has already received ceftriaxone and azithromycin.  We      will continue Advair and add supplemental oxygen as needed.      Continue his pulse oximetry.  2. Hypertension:  Continue Norvasc.  May need titration up to 10 mg.  3. Allergic rhinitis:  Continue Allegra.  4. Fluids, electrolytes, and nutrition/gastrointestinal:  Heart      healthy diet and saline lock IV fluids.  Electrolytes stable.  5. Prophylaxis:  Subcu heparin and Protonix.  6. Disposition:  Full code.  Likely sent home in less than 24 hours if      remains clinically stable.       Lauro Franklin, MD  Electronically Signed      Arnette Norris. Sheffield Slider, M.D.  Electronically Signed    TCB/MEDQ  D:  12/24/2008  T:  12/24/2008  Job:  [id number]

## 2011-04-08 NOTE — H&P (Signed)
[place]. [facility]  Patient:    [name], [name]                      MRN: [id number] Adm. Date:  [id number] Attending:  Doneta Public Dictator:   Clance Boll, M.D. CC:         [facility]   History and Physical  PROBLEM LIST: 1. Acute abdominal pain, likely gastritis versus peptic ulcer disease with    possible pancreatitis. 2. Dehydration secondary to Interceding nausea, vomiting and diarrhea. 3. Chronic obstructive pulmonary disease/asthma.    a. Recent steroid bursts times two on May 28, 2001 and June 22, 2001. 4. History of ethanol abuse.    a. Quit four months ago but recently drank three nights ago. 5. History of tobacco abuse, quit two months ago. 6. History of "ulcer" greater than 20 years ago. No endoscopy done.  HISTORY OF PRESENT ILLNESS:  This is a 55 year old black male who presents to [facility] ER by EMS with a complaint of a three day history of epigastric pain that is sharp and stabbing associated with nausea, vomiting and diarrhea. No radiation.  He has been unable to keep any food down since Monday.  It improved slightly with Maalox and laying flat.  It is worsened by sitting up and by eating.  The patient reports that last week he was seen in the chest clinic for the first time and was diagnosed with COPD and asthma and was "given a shot" and started on a steroid taper.  After one dose of the steroid he began noticing epigastric pain and vomiting.  He also notes that on Saturday night he had a "large alcohol drink" for the first time in several months with a history of ETOH abuse.  He has also recently stopped smoking approximately two months ago.  He reports positive subjective fevers and chills, watery diarrhea.  No melena.  No bright red blood per rectum.  Emesis is green.  No hematemesis. No coffee grounds.  No chest pain.  No shortness of breath.  No previous episodes of similar pain although he has a  history of ulcers over 20 years ago that were undocumented.  He denies sick contacts.  He denies chronic NSAID or aspirin use.  He does note some dark urine and mild dysuria.  No hematuria.  No weight loss.  PAST MEDICAL HISTORY:  See problem list.  PAST SURGICAL HISTORY:  None.  MEDICATIONS:  Albuterol MDI p.r.n.  Azmacort MDI.  Allegra.  Maalox. Pepto-Bismol, TUMS and rare Aleve.  ALLERGIES:  None.  SOCIAL HISTORY:  Positive history of tobacco abuse 1-1/2 packs per day times 30 years, quit two months ago.  History of ETOH unspecified amount, quit three months ago.  Last drink was this past Saturday night. He has had marijuana use. No current drug use.  No IV drug use.  He worked previously as a Psychologist, forensic.  Currently is trying to get disability as recommended by his M.D. at the chest clinic.  The patient is very upset about not being able to work.  He is married times 18 years with children.  FAMILY HISTORY:  No early deaths.  No peptic ulcer.  No gastric cancer.  No colon cancer.  No MI.  No CVA. No diabetes.  REVIEW OF SYSTEMS:  See HPI.  Otherwise negative.  PHYSICAL EXAMINATION:  VITAL SIGNS:  Temperature 98.2, pulse 84, blood pressure 126/70, respirations 22,  O2 saturation 97% on room air.  GENERAL:  He is thin, alert, ill appearing in mild distress secondary to pain.  HEENT:  Sclerae anicteric.  Poor dentition.  NECK:  Supple. No lymphadenopathy.  No thyromegaly.  No jugular venous distention.  CARDIOVASCULAR:  Regular rate and rhythm.  No murmurs or gallops.  LUNGS:  Clear to auscultation and percussion with distant breath sounds.  ABDOMEN:  Soft. Positive tenderness to palpation in the epigastrium. Positive voluntary guarding.  No hepatosplenomegaly. No rebound.  Hyperactive bowel sounds.  BACK:  No CVA tenderness.  EXTREMITIES:  No edema.  2+ DP and PT pulses bilaterally.  RECTAL:  Nontender.  No mass.  Gittings stool in vault.  Hemoccult  negative.  NEUROLOGIC: Nonfocal.  LABORATORY DATA:  Sodium 137, potassium 3.6, chloride 101, bicarbonate 28, BUN 19, creatinine 1.4, glucose 112, calcium 9.7, total protein 7.2, albumin 4.2, AST 23, ALT 18, alkaline phosphatase 44, total bilirubin 1.2, amylase 196, lipase 22.  White blood cell count 7.5, hemoglobin 14.9, platelets 247. UA: Yellow, clear.  Specific gravity of 1.034, pH 8.5, 40 ketones, 100 protein, negative nitrate, negative LE.  Acute abdominal series:  Increased gas pattern in the small and large intestine.  No obstruction.  No perforation.  Chest x-ray:  No active disease.  ASSESSMENT AND PLAN:  This is a 55 year old black male with acute epigastric pain and intractable vomiting and diarrhea.  1. Epigastric pain, likely peptic ulcer disease versus gastritis with    recent ethanol use, high dose prednisone, smoking cessation and Aleve.    Can not rule out acute pancreatitis at this time. Will admit, IV fluids,    IV Pepcid, n.p.o., meperidine for pain, Phenergan. Follow abdominal exam,    lipase.  Consider upper endoscopy if any worsening or if no improvement    with conservative measures. 2. Nausea, vomiting and diarrhea.  As above Phenergan, IV fluids, n.p.o. 3. Chronic obstructive pulmonary disease.  No current wheeze. Continue    albuterol and Azmacort.  With recent exacerbations consider Advair. 4. Financial difficulties. Consult social work to help family with    disability.DD:  06/28/01 TD:  06/28/01 Job: [id number]

## 2011-04-08 NOTE — Consult Note (Signed)
NAME[place]ADIEN, [name]               ACCOUNT NO.:  [id number]   MEDICAL RECORD NO.:  [id number]          PATIENT TYPE:  INP   LOCATION:  2105                         FACILITY:  [facility]   PHYSICIAN:  Oley Balm. Sung Amabile, M.D. [facility] OF BIRTH:  May 08, 1956   DATE OF CONSULTATION:  08/26/2004  DATE OF DISCHARGE:                                   CONSULTATION   REQUESTING PHYSICIAN:  Family Practice Teaching Service   REASON FOR CONSULTATION:  Acute respiratory failure with ventilator  dependence.   HISTORY OF PRESENT ILLNESS:  [name] is a 55 year old gentleman with COPD  on home O2 who had progressive shortness of breath over the past four days.  He discontinued his COPD medications for financial reasons.  He has had  symptoms of cough and sputum production over the past four days as well.  He  presented to the emergency department in severe respiratory distress.  He  was initially started on non-invasive ventilation, but continued to have  severe distress and respiratory acidosis.  Therefore, he underwent elective  intubation by anesthesia just prior to this consultation.  Therefore, no  further history can be obtained.  There is no report of fevers, chills, or  sweats.  No report of purulent sputum or hemoptysis.  No report of lower  extremity edema or calf tenderness.  No report of chest pain.   PAST MEDICAL HISTORY:  COPD.   MEDICATIONS:  1.  Advair 250/50 b.i.d.  2.  Albuterol p.r.n.  3.  Allegra.  4.  Hydrochlorothiazide.   SOCIAL HISTORY:  Still smokes (reportedly 5-10 cigarettes per month).  He  has a history of alcohol abuse.  No history of illicit drug use.   FAMILY HISTORY:  Not available.   REVIEW OF SYSTEMS:  Not available.   PHYSICAL EXAMINATION:  VITAL SIGNS:  Afebrile.  Labile blood pressure which  is presently 187/97, pulse 133 and regular, respirations 20 and greater just  prior to intubation on non-invasive ventilation, oxygen saturation 96%.  GENERAL:  He is  thin, but not cachectic, in markedly respiratory distress  and somewhat agitated.  HEENT:  No acute abnormalities.  NECK:  Supple without adenopathy or jugular venous distention.  CHEST:  Markedly diminished breath sounds with extremely prolonged  expiratory wheezes.  CARDIAC:  Regular rate and rhythm with no murmurs.  ABDOMEN:  Soft, nontender with normal bowel sounds.  EXTREMITIES:  Without clubbing, cyanosis, edema.  NEUROLOGIC:  Without focal deficits.   DATA:  Chest x-ray is hyperinflative without findings of heart failure or  edema.  Laboratory data has been reviewed and is unremarkable.  EKG reveals  no significant abnormalities other than sinus tachycardia.   IMPRESSION AND PLAN:  Chronic obstructive pulmonary disease with status  asthmaticus leading to respiratory acidosis and distress.  He required  intubation and will be a challenge to support on mechanical ventilation  through the night due to the prolonged expiratory phase.  We will use  propofol as our sedation as it has some bronchodilator effect.  He will be  on maximum bronchodilator therapy and high dose intravenous corticosteroids.  Empiric antibiotics will be administered.  DVT and GI prophylaxis have been  ordered.  The rest of his medical management will be dictated by his course.  We might have to use a strategy of __________  hypercapnia initially while  providing ventilatory support.       DBS/MEDQ  D:  08/26/2004  T:  08/27/2004  Job:  [id number]

## 2011-04-08 NOTE — Discharge Summary (Signed)
NAMEZEKIEL, TORIAN               ACCOUNT NO.:  [id number]   MEDICAL RECORD NO.:  [id number]          PATIENT TYPE:  INP   LOCATION:  5729                         FACILITY:  [facility]   PHYSICIAN:  Leighton Roach McDiarmid, M.D.DATE OF BIRTH:  04/16/1956   DATE OF ADMISSION:  10/26/2005  DATE OF DISCHARGE:  11/02/2005                                 DISCHARGE SUMMARY   DISCHARGE DIAGNOSES:  1.  Chronic obstructive pulmonary disease exacerbation.  2.  Hypertension.   DISCHARGE MEDICATIONS:  1.  Advair 500/50 mcg inhaled b.i.d.  2.  Spiriva 18 mcg inhaled daily.  3.  Prednisone 40 mg x3 days, then 30 mg x3 days, then 20 mg x2 days, then      10 mg x2 days, then stop.  4.  Hydrochlorothiazide 25 mg daily.  5.  Xopenex 0.3 mcg/3 mL nebulizer every six to eight hours as needed (may      substitute albuterol inhaler for this).  6.  Allegra 180 mg once daily.   BRIEF HISTORY OF PRESENT ILLNESS:  Mr. Witz is a 55 year old male with a  history of COPD, including an exacerbation that led to intubation about a  year ago, who presented to the ED in respiratory distress with a progressive  decline in level of consciousness.  On arrival to the ED he was tried on  BiPAP for a very short period; however, his blood gas showed a severe  respiratory acidosis with a pH of 7.083 and a CO2 of 116.  The patient was  intubated in the ED and transferred to the intensive care unit.  Other  admission labs:  White count 17.6, hemoglobin 14.6, hematocrit 43, platelets  412.   HOSPITAL COURSE BY PROBLEM:  Problem 1.  RESPIRATORY DISTRESS:  The patient was mechanically ventilated  for five days.  He also initially was started on high-dose IV steroids and  frequent albuterol and Atrovent nebulizers.  He was also treated with Avelox  for a total of seven days.  The patient was extubated on hospital day #5  without event.  We then switched him to oral steroids and did a slow taper  over two weeks.  We also increased  his Advair dose while he was here and  started him on Xopenex and Spiriva.  The patient was also counseled on  smoking cessation while he was here in the hospital.  On discharge, he still  had scattered expiratory wheezes and a mild cough, which he stated was his  baseline.   Problem 2.  HYPERTENSION:  During his hospitalization the patient was  normotensive and continued on his home dose of hydrochlorothiazide 25 mg  daily.   Problem 3.  HYPOKALEMIA:  The patient was hypokalemic on hospital day 1 and  2, likely secondary to beta agonists.  This was repleted without incident.   DISCHARGE INSTRUCTIONS:  A follow-up appointment was set up with Dr. Luretha Murphy for Tuesday, December 19, at 9:30.  The patient was advised to stop  smoking, and the patient is to take all medications as prescribed.  Altamese Cabal, M.D.    ______________________________  Leighton Roach McDiarmid, M.D.    KS/MEDQ  D:  11/06/2005  T:  11/08/2005  Job:  [id number]   cc:   Luretha Murphy, M.D.  [facility]

## 2011-04-08 NOTE — Discharge Summary (Signed)
[place]. [facility]  Patient:    [name], [name]                        MRN: [id number] Adm. Date:  06/28/01 Disc. Date: 07/01/01 Attending:  Sibyl Parr. Darrick Penna, M.D. Dictator:   Dorcas Mcmurray, M.D. CC:         [facility]   Discharge Summary  PRIMARY CARE PHYSICIAN:  Financial trader.  DISCHARGE DIAGNOSES: 1. Peptic ulcer disease. 2. Helicobacter pylori. 3. History of alcohol abuse. 4. Chronic obstructive pulmonary disease/asthma. 5. History of tobacco abuse. 6. Hypertension.  DISCHARGE MEDICATIONS: 1. Prilosec 20 b.i.d. x 1 month, then 1 q.d. 2. Phenergan 25 q.4-6h. p.r.n. nausea. 3. Combivent MDI 2 puffs q.i.d. 4. Clarithromycin 500 mg b.i.d. x 14 days. 5. Amoxicillin 1 g b.i.d. x 14 days. 6. Extra-strength Tylenol 500 mg q.6h. p.r.n. pain.  HISTORY OF PRESENT ILLNESS:  This is a 55 year old male who was admitted on June 28, 2001, secondary to epigastric pain and intractable nausea and vomiting.  His pain was brought on after he was started on a steroid taper and then had some alcohol.  He never had any melena or evidence of rectal bleeding.  HOSPITAL COURSE: #1 - GASTROINTESTINAL:  The patient was initially started on a proton pump inhibitor, kept n.p.o., and given IV fluids as well as Phenergan for nausea. He gradually turned around, with a slow resolution in his nausea.  His appetite picked up, and his abdominal discomfort was gone by the time of his discharge.  His evaluation showed a positive H. pylori titer, and he was started on triple eradication therapy with clarithromycin, amoxicillin, and double-dose PPI.  Of note, his laboratory evaluation was essentially normal with the exception of an amylase of 186.  Lipase was normal.  CMET and CBC were also within normal limits.  #2 - HISTORY OF ALCOHOL ABUSE:  The patient quit four months ago but recently had a drink.  He vows that he will no longer drink alcohol.  #3 -  HISTORY OF TOBACCO ABUSE:  The patient quit two months ago and is no longer smoking.  #4 - HYPERTENSION:  The patients blood pressure during this admission ranged in the 120-170/60-90 range.  He did have several documented blood pressures in the 140-160 range.  This should be monitored as an outpatient, and he may need to be initiated on some therapy.  CONDITION ON DISCHARGE:  Stable and improved, tolerating p.o. well, without any nausea, and with very minimal epigastric tenderness.  FOLLOW-UP:  Patient to follow up with [facility] in the next week or so.  DISCHARGE INSTRUCTIONS:  The patient was given prescriptions for his medications to eradicate therapy and stressed the importance that he take all of these for a full 14-day course. DD:  07/01/01 TD:  07/01/01 Job: [id number]

## 2011-04-08 NOTE — Discharge Summary (Signed)
NAMEHARKIRAT, [name]               ACCOUNT NO.:  [id number]   MEDICAL RECORD NO.:  [id number]          PATIENT TYPE:  INP   LOCATION:  5731                         FACILITY:  [facility]   PHYSICIAN:  [name], M.D.DATE OF BIRTH:  1956/01/08   DATE OF ADMISSION:  08/26/2004  DATE OF DISCHARGE:  08/31/2004                                 DISCHARGE SUMMARY   DISCHARGE DIAGNOSES:  1.  Chronic obstructive pulmonary disease exacerbation status post      ventilator-dependent respiratory failure.  2.  Hypertension.  3.  Hypokalemia.   DISCHARGE MEDICATIONS:  1.  Prednisone 60 mg p.o. daily x1 day, 40 mg p.o. daily x3 days, and 20 mg      p.o. daily x3 days.  2.  Avelox 100 mg one p.o. daily x2 days.  3.  Advair 250/50 one puff b.i.d.  4.  Atrovent inhaler two puffs q.i.d.  5.  Albuterol 2.5 mg/3 mL nebulizer one nebulizer q.2-4h. p.r.n. or      albuterol inhaler two puffs q.2-4h. p.r.n.  6.  Hydrochlorothiazide 25 mg p.o. daily.  7.  Nicotine transdermal patch once daily.  8.  K-Dur 20 mEq p.o. daily.   HISTORY:  This is a 55 year old African-American male who came to [place] on  October 6, who had begun the previous evening with persisting worsening of  breath.  Had not been taking his medications for the past four days due to  financial constraints and also had URI symptoms during that week with  rhinorrhea, cough, headache, sinus pain and pressure, and he has a chronic  cough that remained the same.  He had one episode of vomiting on day of  admission.  The patient was only able to speak three to four word sentences.  He had received 2 units of albuterol and Solu-Medrol en route by EMS.  In  addition, had albuterol and Xopenex in the ED without significant  improvement.  Was using accessory muscles with significant inspiratory and  expiratory wheezing.  Respiratory distress was unable to be controlled in  the ED when critical care was consulted and recommended intubation at which  time he was intubated by anesthesia and admitted to the ICU.   ADMISSION LABORATORIES:  Sodium 138, potassium 3.5, chloride 106, CO2 26,  BUN 16, creatinine 1.1, glucose 185.  White count 18, wbc's 18.0, hemoglobin  14.5, hematocrit 42, platelets 275.  I-stat ABG showed pH 7.28, pCO2 69.1,  pO2 569, HCO3 27.5.  Portable chest x-ray showed hyperinflation without  focal consolidation or effusion.   HOSPITAL COURSE:  #1 - CHRONIC OBSTRUCTIVE PULMONARY DISEASE EXACERBATION:  This admission required patient to be put on a ventilator due to worsening  respiratory failure.  Remained on ventilator for two days at which time he  was extubated without problem.  He was maintained on 2 L of oxygen and  gradually weaned off.  By discharge he only had some diffuse wheezes and few  rhonchi, mild cough which he says is his baseline.  He was discharged home  on prednisone taper, Avelox to complete a seven-day course, Advair and  Atrovent inhaler, and albuterol nebulizers and inhaler.   #2 - HYPERTENSION:  On admission blood pressure was normal.  However, [name] has a history of hypertension.  During hospital days #3 and 4 patient  had elevated blood pressures up to 155 systolic, 91 diastolic.  He was  continued on his hydrochlorothiazide and his blood pressures returned to  normal very nicely.  At discharge they were 110/___________.  Patient  discharged home on hydrochlorothiazide 25 mg.   #3 - HYPOKALEMIA:  The patient was hypokalemic on day before admission and  was given potassium for repletion without problem.  Patient given  prescription for potassium to be taken daily.   FOLLOWUP ITEMS:  Patient is to follow up with his primary care doctor at the  [facility] on October 17 9 a.m. with Dr. Penni Bond.  He  has been instructed to return to the ED if he has fever, shortness of  breath, or to call the [facility].  He is instructed to maintain  a low salt diet and  has been instructed on smoking cessation.       TH/MEDQ  D:  08/31/2004  T:  08/31/2004  Job:  [id number]   cc:   [name], M.D.  [address] [place], [place] [address]  Fax: [phone]   [name], M.D. [facility]

## 2011-04-08 NOTE — Consult Note (Signed)
[name], [name]               ACCOUNT NO.:  [id number]   MEDICAL RECORD NO.:  [id number]          PATIENT TYPE:  INP   LOCATION:  1823                         FACILITY:  [facility]   PHYSICIAN:  Coralyn Helling, M.D.      DATE OF BIRTH:  21-Jul-1956   DATE OF CONSULTATION:  10/26/2005  DATE OF DISCHARGE:                                   CONSULTATION   INDICATION FOR CONSULTATION:  Acute exacerbation of chronic obstructive  pulmonary disease with acute respiratory failure.   This is a 55 year old male who presented to the emergency room with  complaints of increasing shortness of breath.  He has a history of chronic  asthma and chronic obstructive pulmonary disease with also a history of  prior respiratory failure requiring intubation and mechanical ventilation.  When in the emergency room, he was initially placed on noninvasive positive  pressure ventilation however, he was not able to tolerate this and he  continued to deteriorate and subsequently required intubation and mechanical  ventilation.  He is currently sedated and there are no other family members  available to provide medical history.  His history therefore is obtained  from chart review.   PAST MEDICAL HISTORY:  Significant for chronic asthma with chronic  obstructive pulmonary disease, Helicobacter pylori and a history of peptic  ulcers.   There are no known drug allergies.   MEDICATIONS:  His medications are Advair and albuterol.   FAMILY HISTORY:  Noncontributory.   SOCIAL HISTORY:  He worked as a Psychologist, forensic.  He has a history  of smoking.   REVIEW OF SYSTEMS:  Unobtainable.   PHYSICAL EXAMINATION:  VITAL SIGNS:  On physical examination, his heart rate  is 128, respiratory rate is 22, blood pressure is 136/42, SAO2 is 92%.  HEENT:  Pupils are pin point.  He has an ET tube in place.  There is no  lymphadenopathy.  However, his S1 and S2 tachycardic.  CHEST:  Prolonged expiratory phase, diffuse  bilateral wheezing.  ABDOMEN:  Thin, soft, decreased bowel sounds.  EXTREMITIES:  There is no cyanosis, clubbing or edema.  Peripheral pulses  were palpable and equal.  NEUROLOGICAL:  He has just been recently intubated, sedated and given  paralytic.   EKG shows sinus tachycardia.  Chest x-ray shows hyperinflation, otherwise no  acute disease.   LABORATORY VALUES:  CBC:  WBC is 8.8, hemoglobin is 13.8, hematocrit 41.3,  platelet count is 253,000.  Sodium is 140, potassium is 3.6, chloride is  108, BUN is 15, glucose of 85, creatinine is 1.2.  Arterial blood gas  analysis prior to intubation showed a pH of 7.08, pCO2 is 116, pO2 is 263.   IMPRESSION:  Acute hypercapnic respiratory failure secondary to chronic  asthma/chronic obstructive pulmonary disease.  The patient is currently on  mechanical ventilation status post intubation.  He will be admitted to  intensive care unit on the family practice teaching service.  Would continue  him on his inhalers with nebulizers of albuterol and Atrovent.  Would also  continue with intravenous corticosteroids.  Would monitor him for the  development of intrinsic beat and keep him on relatively low respiratory  rates and titer volume with relatively higher inspiratory flow rates to try  and minimize the amount of air trapping and development of auto PEEP.  Would  then follow up on an arterial blood gas and allow for permissive hypercapnia  and also follow up on chest x-ray.  He may also benefit from the addition of  p.o. Singulair after an NG tube is placed.  Would keep him on deep vein  thrombosis prophylaxis and peptic ulcer disease prophylaxis and would also  consider doing a urine toxicology screen to assess whether he has any  illicit drug use which may have contributed to his asthma/chronic  obstructive pulmonary disease exacerbation.      Coralyn Helling, M.D.  Electronically Signed     VS/MEDQ  D:  10/26/2005  T:  10/26/2005  Job:   [id number]

## 2011-04-08 NOTE — Discharge Summary (Signed)
NAME:  [name], [name]                         ACCOUNT NO.:  [id number]   MEDICAL RECORD NO.:  [id number]                   PATIENT TYPE:  INP   LOCATION:  5730                                 FACILITY:  [facility]   PHYSICIAN:  Jonna L. Robb Matar, M.D.            DATE OF BIRTH:  10/28/1956   DATE OF ADMISSION:  05/31/2004  DATE OF DISCHARGE:  06/01/2004                                 DISCHARGE SUMMARY   PRIMARY CARE Seher Schlagel:  [facility].   FINAL DIAGNOSES:  1. Acute exacerbation of chronic asthma.  2. Allergic bronchitis.  3. Chronic obstructive pulmonary disease.  4. Hypertension.  5. Hypokalemia.  6. Mild anemia.   ALLERGIES:  None.   CODE STATUS:  Full.   HISTORY:  This 55 year old African American male with a history of COPD and  asthma had run out of his medications and came in with a severe  exacerbation.  In the emergency room, he required epinephrine, Solu-Medrol  and was close to intubation but did respond to BiPAP.   PAST MEDICAL HISTORY:  1. H. pylori.  2. He had an evaluation for obstructive sleep apnea but the results were not     known.   PHYSICAL EXAMINATION:  Physical examination was notable for CO2 retention of  61, diffuse severe bilateral wheezing.   LABORATORY DATA:  Potassium was 3.2.  White count 12.5.   HOSPITAL COURSE:  The patient was treated with corticosteroids and  nebulizers.  He did very well.  Within 12 hours, he was able to come off the  BiPAP, was saturating at 99% on just 2 L and then on room air.  He had an  abnormal EKG but echocardiography was normal.  His potassium was replaced.   DISPOSITION:  The patient will be discharged on:  1. Allegra XL 180 mg daily.  2. Advair 250/50 mcg 1 puff b.i.d.  3. Combivent 2 puffs at least b.i.d. and can go to 4 times daily.  4. Albuterol q.4 h. p.r.n.  5. He has been given 2 prescriptions to use as needed, 1 for a Medrol     Dosepak 4 mg and 1 for Z-Pack.   FOLLOWUP:  He is to get back with  [facility] and get seen within the month.                                                Jonna L. Robb Matar, M.D.    Dorna Bloom  D:  06/01/2004  T:  06/01/2004  Job:  [id number]   cc:   Dala Dock

## 2011-04-08 NOTE — H&P (Signed)
NAME:  [name], [name]                         ACCOUNT NO.:  [id number]   MEDICAL RECORD NO.:  [id number]                   PATIENT TYPE:  EMS   LOCATION:  MAJO                                 FACILITY:  [facility]   PHYSICIAN:  Hettie Holstein, D.O.                 DATE OF BIRTH:  Dec 15, 1955   DATE OF ADMISSION:  05/31/2004  DATE OF DISCHARGE:                                HISTORY & PHYSICAL   PRIMARY CARE PHYSICIAN:  [facility]amming at [facility].   This is an [facility] admission.   CHIEF COMPLAINT:  Asthma exacerbation.   HISTORY OF PRESENT ILLNESS:  This is a pleasant 55 year old African-American  male with history of COPD, asthma, on daily nebulizer therapy and inhaled  steroids at home, non steroid dependent, who has no prior history of ICU  course or mechanical ventilation or BIPAP before.  However, he presented in  respiratory distress this admission requiring IV Solu-Medrol in addition to  epinephrine administered in the field prior to arrival in the emergency  department, and initiation of BIPAP by Dr. Christeen Douglas in the emergency  department.  He stated that he started having problems early this morning  around 1 o'clock.  He tried multiple treatments, he states over 10 nebulizer  treatments without relief.  He stated that he has had increasing productive  cough lately as well as a recent cold he feels that may have triggered this  event.  He has recently run out of his Advair (2 or 3 days ago).  He had a  recent evaluation by over night pulse oximetry as ordered by his primary  care physician and suspect that may have been evaluating his hypertension  and suspicion of obstructive sleep apnea.  However, I do not have records  available regarding this at this time.   In any event, he was seen in the emergency department after he had been  given initial treatments and improved.  At this time he is being admitted  for further observation and treatment.  He was noted to have an abnormal  EKG  with an inferior infarct pattern and  I am going to check a 2D  echocardiogram during this hospitalization so that his primary care  physician will have this information.   PAST MEDICAL HISTORY:  1. A history of  H. Pylori treated in the past.  2. History of non oxygen dependent chronic pulmonary obstructive disease.  3. Asthma.  4. History of tobacco dependence.  5. History of ulcers in the past and GI problems felt to be associated with     Prednisone.   PAST SURGICAL HISTORY:  The patient denies.   MEDICATIONS:  Combivent, albuterol, Nasocort, Triamcinolone, Allegra, and  Advair which ran out a couple of days ago.   ALLERGIES:  No known drug allergies.   SOCIAL HISTORY:  He is a former heavy Arboriculturist.  He quit smoking  about a year ago, on occasion he smokes 1 cigarette with friends according  to his wife.  He only drinks occasional alcohol.  His activity is limited at  home due to shortness of breath.  He does ascend a flight of stairs with  considerable shortness of breath at times.   FAMILY HISTORY:  Noncontributory.   REVIEW OF SYSTEMS:  He reports some subjective fevers, chills.  No headaches  or vision changes.  He did develop some nausea and vomiting with this recent  episode at home.  Cough productive of white sputum by his report.  No  diarrhea.  No lower extremity swelling.  Appetite is fair.  He is starting  to put on some weight intentionally.   PHYSICAL EXAMINATION:  VITAL SIGNS:  Blood pressure is 181/98, heart rate is  100 to 110, respirations 20 to 22.  He is on BIPAP at this time, O2  saturation is 90%.  GENERAL:  He is fully alert and oriented, in no acute distress at this time.  He is breathing comfortably with the BIPAP.  HEART:  Regular, however, slightly tachycardic.  LUNGS:  Reveal breath sounds bilaterally with wheeze.  His breath sounds are  diminished throughout.  ABDOMEN:  Soft and nontender, no palpable hepatosplenomegaly.   NEUROLOGIC EXAM:  Reveals no focal neurologic deficit.  He is euthymic and  his affect is stable.  LOWER EXTREMITIES:  Reveal no edema.  Peripheral pulses are symmetrical and  palpable x4.   LABORATORY DATA:  (Obtained in the emergency department)  Revealed a WBC of  12,500, hemoglobin 14.1, platelet count 278,000.  Sodium 138, potassium 3.5,  BUN 20, creatinine 1.3.  AST 25.  ALT 29, albumin of 4.1.  Chest x-ray reveals no acute pulmonary disease.  EKG reveals sinus tachycardia with inferior infarct and nonspecific ST  findings.  His pH was 7.25, PCO2 61, PO2 was within normal limits on  support.   ASSESSMENT:  1. Asthma exacerbation.  2. Underlying chronic pulmonary obstructive disease.  3. Hypertension.  4. History of ulcers.  5. Abnormal EKG.  6. Hyperkalemia.  7. Leukocytosis.   PLAN:  I am going to admit [name] to step down for observation over the  next few hours.  If he is able to tolerate a trial of BIPAP he can transfer  to a regular floor.  I am going to start him on antibiotics in addition to  steroids, and start him back on Advair and anticipate discharge home within  the next 24 to 48 hours if he continues to improve.      Please note:  Mr. Anthony Whitmire. Bond has 2 medical record numbers, in addition  to [id number] he has: [id number].                                                Hettie Holstein, D.O.    ESS/MEDQ  D:  05/31/2004  T:  05/31/2004  Job:  [id number]   cc:   Tresa Endo L. Philipp Deputy, M.D.  [phone]  [place] [address]  Fax: [phone]

## 2011-04-08 NOTE — H&P (Signed)
NAMEHAMLIN, DEVINE               ACCOUNT NO.:  [id number]   MEDICAL RECORD NO.:  [id number]          PATIENT TYPE:  INP   LOCATION:  1823                         FACILITY:  [facility]   PHYSICIAN:  Leighton Roach McDiarmid, M.D.DATE OF BIRTH:  04/09/1956   DATE OF ADMISSION:  10/26/2005  DATE OF DISCHARGE:                                HISTORY & PHYSICAL   CHIEF COMPLAINT:  Respiratory distress.   HISTORY OF PRESENT ILLNESS:  Mr. Minish is a 55 year old male with a history  of COPD including an exacerbation with status asthmaticus requiring  intubation in October of 2005.  Today the patient arrived by EMS in severe  respiratory distress with progressive decline in level of consciousness.  He  was tried on BiPAP for a very short period.  Meanwhile his blood gas showed  severe respiratory acidosis so he was intubated.  We spoke with his family  who gave a history of rapidly progressive shortness of breath throughout the  day not responding to albuterol.  They also stated that the patient has had  a few day history of increased productive cough and subjective fever.  Also,  apparently his oxygen tank was empty and he may have been smoking.   REVIEW OF SYSTEMS:  Unable to obtain secondary to patient being intubated.   PAST MEDICAL HISTORY:  1.  COPD.  2.  Hypertension.  3.  Tobacco abuse.  4.  Left inguinal hernia.   FAMILY HISTORY:  Unable to obtain.   MEDICATIONS:  1.  Advair Diskus 250/50 one puff b.i.d.  2.  Albuterol two puffs q.4h. p.r.n.  3.  Allegra 180 mg p.o. daily.  4.  Atrovent metered dose inhaler two puffs q.i.d.  5.  HCTZ 25 mg p.o. daily.  6.  Oxygen 2 L via nasal cannula at nighttime and p.r.n.  7.  Diclofenac 75 mg p.o. b.i.d.   ALLERGIES:  No known drug allergies.   SOCIAL HISTORY:  Patient is on disability secondary to lung disease.  He  lives in [place] with his wife.  He smokes approximately a pack and a  half each week for the past 30 years.  He has a  history of heavy drinking,  but none now.  No drugs.   PHYSICAL EXAMINATION:  VITAL SIGNS:  Temperature not taken, blood pressure  131/86, pulse 137, respirations 10 (patient on ventilator), O2 91% on FiO2  of 0.45.  GENERAL:  Patient is sedated and intubated.  He is unable to follow  commands.  HEENT:  Head is normocephalic, atraumatic.  Pupils are equal, round, and  reactive to light and accommodation.  We are unable to assess extraocular  movements.  He has an ET tube in his oropharynx.  LUNGS:  Patient has very poor air movement with diffuse inspiratory and  expiratory movements.  No crackles.  No rhonchi heard.  CARDIOVASCULAR:  Tachycardic.  Regular rate.  No murmurs, rubs, or gallops.  ABDOMEN:  Positive bowel sounds, soft, nontender, nondistended.  No  hepatosplenomegaly.  Left inguinal hernia.  EXTREMITIES:  No clubbing, cyanosis, edema.  Pulses are 2+ throughout.  GENITOURINARY:  Significant for left inguinal hernia.  NEUROLOGIC:  Unable to assess cranial nerves strength and sensation.  SKIN:  No rashes or lesions.  LYMPH:  No adenopathy.   LABORATORIES:  ABG on BiPAP 7.083, 116, 263, 34.6.  White blood count 17.6,  hemoglobin 14.6, hematocrit 43, platelets 412, MCV 96.  Diff:  40%  neutrophils, 46% lymphocytes.  D-dimer less than 0.22.  UA negative.  BMP:  Sodium 142, potassium 3.4, chloride 105, bicarbonate 31, BUN 18, creatinine  1.1, glucose 155, total protein 7.2, alkaline phosphatase 159, albumin 3.8,  total bilirubin 0.5, AST 66, ALT 156.   ASSESSMENT/PLAN:  This is a 55 year old male with respiratory distress.  1.  Respiratory distress.  This patient likely has ventilation-dependent      respiratory failure secondary to chronic obstructive pulmonary disease      exacerbation/status asthmaticus.  We will do albuterol nebulizers every      two hours scheduled and every one hour as needed, Atrovent 500 mg      nebulizers every six hours, Avelox 400 mg intravenous  daily, and Solu-      Medrol 125 mg intravenous every 12 hours.  We did an ABG prior to      ventilation demonstrating severe respiratory alkalosis.  Will check      another in about one hour.  Will also get an a.m. chest x-ray and repeat      ABG.  Critical care medicine has been consulted to aid with management.  2.  Respiratory acidosis.  This is likely secondary to asthma exacerbation.      Will have the patient on a low respiratory rate to give him time to blow      off CO2.  3.  Hypokalemia.  This is likely secondary to albuterol.  We will give      fluids with K and four runs of intravenous K.  4.  Prophylaxis.  He will be on intravenous Protonix as gastrointestinal      prophylaxis and sequential compression devices for deep venous      thrombosis prophylaxis.  5.  Hypertension.  Will continue hydrochlorothiazide 25 mg daily per tube.  6.  Increased LFTs.  This elevation seems to be acute in reviewing of the      medical record.  We will recheck these in the a.m.  7.  Leukocytosis.  This is likely secondary to a stress reaction.  The      patient is on antibiotics for his chronic obstructive pulmonary disease      exacerbation.  8.  Fluids, electrolytes, and nutrition.  Patient is n.p.o.  We will do      maintenance intravenous fluids and recheck his electrolytes in the a.m.      Altamese Cabal, M.D.    ______________________________  Leighton Roach McDiarmid, M.D.    KS/MEDQ  D:  10/26/2005  T:  10/26/2005  Job:  [id number]

## 2011-04-08 NOTE — Op Note (Signed)
[name], [name]               ACCOUNT NO.:  [id number]   MEDICAL RECORD NO.:  [id number]          PATIENT TYPE:  INP   LOCATION:  2105                         FACILITY:  [facility]   PHYSICIAN:  Sheldon Silvan, M.D.      DATE OF BIRTH:  1956/01/17   DATE OF PROCEDURE:  08/26/2004  DATE OF DISCHARGE:                                 OPERATIVE REPORT   PROCEDURE:  Endotracheal intubation.   I was called to the emergency department by Oley Balm. Simonds, M.D. [facility] for  emergency airway management in this patient known to have COPD, reflux, and  the onset of respiratory failure today likely secondary to an upper  respiratory tract infection, possibly pneumonia.  On my arrival, he was  struggling for air and using a BiPAP machine to maintain oxygenation.  He  was assured that we would be placing a breathing tube for him with drugs  that would make him sleep and he understood this and gave his consent  verbally.   DESCRIPTION OF PROCEDURE:  He was placed in the supine position and  Pentothal 250 mg was given intravenously in addition to succinylcholine 100  mg IV.  He had been NPO for greater than six hours.   A 3 Mac blade was used by Julianne Rice, C.R.N.A., to visualize the vocal  cords.  An 8.0 mm endotracheal tube was passed through the vocal cords to  bilateral breath sounds.  There was bilateral wheezing and ventilatory  pressures were quite high, at least by feeling the Ambu bag.  There was  positive CO2 noted by the EZ Cap device.  Ventilation care of the patient  was turned over to Dr. Sung Amabile, who was present at the time.      HQIO   DC/MEDQ  D:  08/26/2004  T:  08/27/2004  Job:  [id number]

## 2011-05-09 ENCOUNTER — Other Ambulatory Visit: Payer: Self-pay | Admitting: Family Medicine

## 2011-05-09 NOTE — Telephone Encounter (Signed)
Refill request. However patient came to office this morning wanting refills on amlopidine, proventil, and advair , stated he contacted pharmacy  But we had not received any refill request at that time. Called and ask pharmacist to send over refill request . Pharmacist called back stating patient has refills on these three meds on file  that he never picked up form November 2011. Advised to refill now. Appointment scheduled with Dr. Edmonia James 05/26/2011.

## 2011-05-26 ENCOUNTER — Ambulatory Visit: Payer: Medicare Other | Admitting: Family Medicine

## 2011-07-28 ENCOUNTER — Emergency Department (HOSPITAL_COMMUNITY)
Admission: EM | Admit: 2011-07-28 | Discharge: 2011-07-28 | Disposition: A | Discharge status: Home / Self Care | Payer: Medicare Other | Attending: Emergency Medicine | Admitting: Emergency Medicine

## 2011-07-28 ENCOUNTER — Inpatient Hospital Stay (HOSPITAL_COMMUNITY)
Admission: EM | Admit: 2011-07-28 | Discharge: 2011-07-30 | DRG: 192 | Disposition: A | Discharge status: Home / Self Care | Payer: Medicare Other | Attending: Family Medicine | Admitting: Family Medicine

## 2011-07-28 ENCOUNTER — Emergency Department (HOSPITAL_COMMUNITY): Payer: Medicare Other

## 2011-07-28 ENCOUNTER — Ambulatory Visit: Payer: Medicare Other | Admitting: Family Medicine

## 2011-07-28 DIAGNOSIS — F17200 Nicotine dependence, unspecified, uncomplicated: Secondary | ICD-10-CM | POA: Diagnosis present

## 2011-07-28 DIAGNOSIS — J069 Acute upper respiratory infection, unspecified: Secondary | ICD-10-CM | POA: Insufficient documentation

## 2011-07-28 DIAGNOSIS — Z79899 Other long term (current) drug therapy: Secondary | ICD-10-CM | POA: Insufficient documentation

## 2011-07-28 DIAGNOSIS — J3489 Other specified disorders of nose and nasal sinuses: Secondary | ICD-10-CM | POA: Insufficient documentation

## 2011-07-28 DIAGNOSIS — Z7982 Long term (current) use of aspirin: Secondary | ICD-10-CM

## 2011-07-28 DIAGNOSIS — R05 Cough: Secondary | ICD-10-CM | POA: Insufficient documentation

## 2011-07-28 DIAGNOSIS — I10 Essential (primary) hypertension: Secondary | ICD-10-CM | POA: Diagnosis present

## 2011-07-28 DIAGNOSIS — R059 Cough, unspecified: Secondary | ICD-10-CM | POA: Insufficient documentation

## 2011-07-28 DIAGNOSIS — J438 Other emphysema: Secondary | ICD-10-CM | POA: Insufficient documentation

## 2011-07-28 DIAGNOSIS — R0989 Other specified symptoms and signs involving the circulatory and respiratory systems: Secondary | ICD-10-CM | POA: Insufficient documentation

## 2011-07-28 DIAGNOSIS — J441 Chronic obstructive pulmonary disease with (acute) exacerbation: Principal | ICD-10-CM | POA: Diagnosis present

## 2011-07-28 DIAGNOSIS — R079 Chest pain, unspecified: Secondary | ICD-10-CM | POA: Insufficient documentation

## 2011-07-28 DIAGNOSIS — R0602 Shortness of breath: Secondary | ICD-10-CM | POA: Insufficient documentation

## 2011-07-28 DIAGNOSIS — R0609 Other forms of dyspnea: Secondary | ICD-10-CM | POA: Insufficient documentation

## 2011-07-28 DIAGNOSIS — IMO0002 Reserved for concepts with insufficient information to code with codable children: Secondary | ICD-10-CM

## 2011-07-28 LAB — CBC
HCT: 42.1 % (ref 39.0–52.0)
Platelets: 264 10*3/uL (ref 150–400)
RDW: 12.7 % (ref 11.5–15.5)
WBC: 7.4 10*3/uL (ref 4.0–10.5)

## 2011-07-28 LAB — DIFFERENTIAL
Basophils Absolute: 0 10*3/uL (ref 0.0–0.1)
Eosinophils Relative: 0 % (ref 0–5)
Lymphocytes Relative: 6 % — ABNORMAL LOW (ref 12–46)

## 2011-07-29 DIAGNOSIS — J441 Chronic obstructive pulmonary disease with (acute) exacerbation: Secondary | ICD-10-CM

## 2011-07-29 DIAGNOSIS — J9600 Acute respiratory failure, unspecified whether with hypoxia or hypercapnia: Secondary | ICD-10-CM

## 2011-07-29 LAB — CARDIAC PANEL(CRET KIN+CKTOT+MB+TROPI)
CK, MB: 12.1 ng/mL (ref 0.3–4.0)
Relative Index: 3.2 — ABNORMAL HIGH (ref 0.0–2.5)
Total CK: 377 U/L — ABNORMAL HIGH (ref 7–232)
Total CK: 413 U/L — ABNORMAL HIGH (ref 7–232)

## 2011-07-29 LAB — BASIC METABOLIC PANEL
Calcium: 10 mg/dL (ref 8.4–10.5)
Chloride: 106 mEq/L (ref 96–112)
GFR calc Af Amer: >60 mL/min (ref >60)
GFR calc non Af Amer: >60 mL/min (ref >60)
Potassium: 3.7 mEq/L (ref 3.5–5.1)
Sodium: 142 mEq/L (ref 135–145)

## 2011-07-29 LAB — CBC
MCH: 31.7 pg (ref 26.0–34.0)
MCHC: 34.5 g/dL (ref 30.0–36.0)
Platelets: 260 10*3/uL (ref 150–400)

## 2011-07-29 LAB — CK TOTAL AND CKMB (NOT AT ARMC)
CK, MB: 6 ng/mL — ABNORMAL HIGH (ref 0.3–4.0)
CK, MB: 9.6 ng/mL (ref 0.3–4.0)
CK, MB: 11.2 ng/mL (ref 0.3–4.0)
Relative Index: 2.1 (ref 0.0–2.5)
Total CK: 287 U/L — ABNORMAL HIGH (ref 7–232)
Total CK: 308 U/L — ABNORMAL HIGH (ref 7–232)

## 2011-07-29 LAB — TROPONIN I: Troponin I: <0.3 ng/mL (ref <0.30)

## 2011-07-29 NOTE — H&P (Signed)
Subjective: He is a 55 y.o. male presents with an exacerbation of COPD. Symptoms include constant cough, tightness in chest and wheezing.  Symptoms began 1 day ago, gradually worsening since. Patient was in the ED in the AM on 9/6, received treatment in the ED, and was discharged with prednisone and azithromycin.  He says he went home, got worse again, took about 5 home nebulizer treatments without improvement, and came back to the ED.  He denies chest pain, located left chest or substernal area. Associated symptoms include nasal congestion, productive cough and sweats.  He has not had recent travel.  Weight has been stable.  Appetite has been unchanged. Symptoms are exacerbated by pt has been caring for his mother, she lives in an older house which aggravates his breathing..  Symptoms are alleviated by oxygen and medication(s) (albuterol). He has treated this current exacerbation with: one dose of solumedrol and multiple albuterol nebulizer treatments. . He has had tachycardia to medications. He currently smokes 3 cigarrets per day.  He has been smoking for many years.   Patient Active Problem List  Diagnoses Date Noted  . TOBACCO USER 08/04/2009  . ERECTILE DYSFUNCTION 12/31/2008  . COPD 12/31/2008  . COLON POLYP 01/18/2007  . HYPERTENSION, BENIGN SYSTEMIC 01/18/2007    ALLERGIES:  SPIRIVA.      PAST SURGICAL HISTORY:  None.      FAMILY HISTORY:  Mother has asthma, now is 70 years old with Alzheimer.   Father has hypertension, bladder cancer, and heart disease.      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  He is married.  Wife is a   Engineer, civil (consulting).  Born and raised in [place], education do ninth grade.  Smoked one third to half pack per day for 30 years, quit in 2009, but the   patient says that he continues to smoke thre cigarettes per day.  Alcohol use; used drink heavily, but now with only on occasion.  Denies any   illicit drug use  Review of Systems Pertinent items are noted in HPI.  Objective: Vital signs:  T 98.3   P 124  BP 144/86 RR 20  Pulse Ox 98% on Continuous Albuterol Treatment  General appearance: alert, cooperative and moderate distress Eyes: conjunctivae/corneas clear. PERRL, EOM's intact. Fundi benign. Throat: lips, mucosa, and tongue normal; teeth and gums normal Neck: no adenopathy, no carotid bruit, no JVD, supple, symmetrical, trachea midline and thyroid not enlarged, symmetric, no tenderness/mass/nodules Lungs: wheezes diffusely throuout all lung fields, poor air movement. Chest wall: no tenderness Heart: Tachycardic, regular rhythm, no m/r/g Abdomen: soft, non-tender; bowel sounds normal; no masses,  no organomegaly Extremities: extremities normal, atraumatic, no cyanosis or edema Pulses: 2+ and symmetric Neurologic: Grossly normal  Chest X-Ray: normal Data Review: Lab Results  Component Value Date   WBC 7.4 07/28/2011   HGB 14.8 07/28/2011   HCT 42.1 07/28/2011   MCV 91.7 07/28/2011   PLT 264 07/28/2011   Neutrophils, %                           94         h      43-77            %  Lymphocytes, %  6          l      12-46            %  Monocytes, %                             0          l      3-12             %  Eosinophils, %                           0                 0-5              %  Basophils, %                             0                 0-1              %  Neutrophils, Absolute                    6.9               1.7-7.7          K/uL  Lymphocytes, Absolute                    0.4        l      0.7-4.0          K/uL  Monocytes, Absolute                      0.0        l      0.1-1.0          K/uL  Eosinophils, Absolute                    0.0               0.0-0.7          K/uL  Basophils, Absolute                      0.0               0.0-0.1          K/uL  Lab Results  Component Value Date   NA 138 07/28/2011   K 3.7 07/28/2011   CL 106  07/28/2011   CO2 17* 07/28/2011   Lab Results  Component Value Date   BUN 24* 07/28/2011   Lab Results  Component Value Date   CREATININE 1.27 07/28/2011     Assessment/Plan: Exacerbation of COPD, Severe, requiring inpatient admission  1) Pulmonary- Admit to Step down Unit for close monitoring.  Will send blood cultures. Supplemental oxygen to keep SaO2 > 92%.Corticosteroids.Avelox 400 mg PO daily Will order Continuous Albuterol Therapy 10mg /hr, will space out albuterol when patient tolerates. Continue home Advair 2) Pt tachycardic, likely secondary to albuterol, but will order EKG and cardiac enzymes to ensure he is tolerating tachycardia.  Will give home Norvasc, and have PRN metoprolol for tachycardia or hypertension.  Continue  home Asprin.  3) FEN/GI- will keep pt NPO except meds while on CAT, give MIVF, NS with 20 meq kcl, Monitor Electrolytes closely. Will give Protonix 40 mg PO daily for GI prophylaxis.  4) DVT Prophylaxis- Heparin 5000 U SQ TID. 5) Disposition- pending clinical improvement.   I saw Mr Lukas.  I d/w Dr Lula Olszewski.  I agree with her findings and plans as documented in her note above.   MCDIARMID,TODD D

## 2011-07-30 LAB — BASIC METABOLIC PANEL
BUN: 16 mg/dL (ref 6–23)
CO2: 23 mEq/L (ref 19–32)
Chloride: 114 mEq/L — ABNORMAL HIGH (ref 96–112)
Creatinine, Ser: 0.99 mg/dL (ref 0.50–1.35)
Glucose, Bld: 75 mg/dL (ref 70–99)

## 2011-07-30 LAB — CBC
HCT: 37.8 % — ABNORMAL LOW (ref 39.0–52.0)
MCV: 92.6 fL (ref 78.0–100.0)
RBC: 4.08 MIL/uL — ABNORMAL LOW (ref 4.22–5.81)
WBC: 15.7 10*3/uL — ABNORMAL HIGH (ref 4.0–10.5)

## 2011-07-30 LAB — CARDIAC PANEL(CRET KIN+CKTOT+MB+TROPI): Total CK: 324 U/L — ABNORMAL HIGH (ref 7–232)

## 2011-08-04 LAB — CULTURE, BLOOD (ROUTINE X 2)
Culture  Setup Time: 201209071106
Culture: NO GROWTH

## 2011-08-05 ENCOUNTER — Inpatient Hospital Stay: Payer: Medicare Other | Admitting: Family Medicine

## 2011-08-08 NOTE — Discharge Summary (Signed)
[name], [name] NO.:  [id number]  MEDICAL RECORD NO.:  [id number]  LOCATION:  2608                         FACILITY:  [facility]  PHYSICIAN:  Nestor Ramp, MD        DATE OF BIRTH:  Dec 22, 1955  DATE OF ADMISSION:  07/28/2011 DATE OF DISCHARGE:  07/30/2011                              DISCHARGE SUMMARY   PRIMARY CARE PROVIDER:  Ellin Mayhew, MD  ADMISSION DIAGNOSIS:  Shortness of breath, wheezing.  DISCHARGE DIAGNOSIS:  Chronic obstructive pulmonary disease exacerbation.  DISCHARGE MEDICATIONS:  New Medications: 1. Aspirin 81 mg. 2. Avelox 400 mg by mouth daily. 3. Protonix 40 mg daily. 4. Prednisone 20 mg instructions are 3 tablets a day for 3 days 2     tablets a day for 3 days 1 tablet a day for 5 days, then stop.  Home medications to continue: 1. Advair Diskus 500/50 one puff b.i.d. 2. Allegra 180 mg by mouth daily. 3. Amlodipine 10 mg p.o. daily. 4. Albuterol 2 puffs every 4 hours as needed for shortness of breath     and wheezing.  Stop taking the following medications: 1. Aspirin 325 mg daily. 2. Azithromycin 250 mg by mouth daily.  CONSULTANTS:  None.  LABORATORY DATA AND PROCEDURES:  A chest x-ray on July 28, 2011, showed no active cardiopulmonary disease.  Laboratory Data:  Blood cultures are still pending but no growth to date.  CBC showed white blood cell count of 15.7, hemoglobin 13.1, hematocrit 37.8, platelets 273.  Electrolytes were within normal limits except for chloride which was 114.  BRIEF HOSPITAL COURSE:  The patient is a 55 year old man with a past medical history of COPD and hypertension who presented to the emergency department with shortness of breath and wheezing after previously been seen at emergency department and discharged. 1. COPD exacerbation.  At previous hospital emergency department     visit, the patient was started on azithromycin and received 1 day     of Solu-Medrol IV.  He also had nebulizer  treatments and was sent     home with nebulizer treatments which did not help.  The patient     returned to the emergency department with respiratory status that     was questionable and he was admitted to the step-down unit for     close monitoring.  The patient was started on continuous albuterol     treatments, then spaced out to intermittent albuterol treatments.     Within 24 hours in the hospital, the patient was tolerating     steroids and albuterol every 2 hours.  The patient was on oxygen     until that time which he easily weaned.  The patient was also started     on Avelox 400 mg daily and at discharge he will continue     corticosteroid, Avelox, his home Advair and albuterol as needed. 2. Tachycardia.  High heart rate on admission likely secondary to the     numerous albuterol treatments he was receiving.  EKG was within     normal limits.  Cardiac enzymes showed a normal troponins but an     elevated CK-MB and  CK total which trended down nicely with no     concern for cardiac involvement.  The patient was on his home     Norvasc throughout hospital admission and received metoprolol     p.r.n. for tachycardia and hypertension.  The patient was restarted     on aspirin at discharge at a lower dose of 81 mg. 3. Tobacco use.  The patient is a current smoker, states he smoked     only a few cigarettes a day but he notes that this is bad for him.     When initially asked about cessation, he said that he was not     interested in verbal counseling or any assistance.  The patient had     quit smoking before and he would like to do it again on his own.     On further questioning, he states that he is interested and ready     to quit smoking.  The patient will follow up with his PCP which     could be further discussed at that time.  FOLLOWUP APPOINTMENTS:  [facility] on August 05, 2011, at 9:45 a.m.  FOLLOWUP INSTRUCTIONS:  Please followup the patient's  respiratory status.  By the time of followup, he should have completed his steroid burst and be close to finishing his Avelox treatment.  Please also address smoking cessation with him as he seemed interested to quit when he was in the hospital.  DISCHARGE CONDITION:  The patient was discharged home in stable medical condition.    ______________________________ Rodman Pickle, MD   ______________________________ Nestor Ramp, MD    AH/MEDQ  D:  08/02/2011  T:  08/03/2011  Job:  [id number]  Electronically Signed by Rodman Pickle  on 08/03/2011 10:06:06 AM Electronically Signed by Denny Levy MD on 08/08/2011 10:22:16 AM

## 2011-08-12 ENCOUNTER — Inpatient Hospital Stay: Payer: Medicare Other | Admitting: Family Medicine

## 2011-08-22 NOTE — H&P (Signed)
NAMEIZEK, CORVINO NO.:  [id number]  MEDICAL RECORD NO.:  [id number]  LOCATION:  2608                         FACILITY:  [facility]  PHYSICIAN:  Leighton Roach Mizraim Harmening, M.D.DATE OF BIRTH:  1956-07-27  DATE OF ADMISSION:  07/29/2011 DATE OF DISCHARGE:                             HISTORY & PHYSICAL   PRIMARY CARE PROVIDER:  Ellin Mayhew, M.D., [facility].  CHIEF COMPLAINT:  Wheezing and dyspnea.  HISTORY OF PRESENT ILLNESS:  The patient is a 55 year old male who presents with a COPD exacerbation.  His symptoms include a cough, tightness in his chest, and wheezing.  Symptoms began 1 day ago and then gradually worsening.  The patient was seen in the ED in the morning on September 6.  He received some nebulizer treatments in the emergency department.  He was discharged home with prednisone and azithromycin. The patient says he went home, got this medications filled, but he got worse again and he took 5 albuterol nebulizer treatments at home without improvement, so he came back to the emergency department.  The patient denies chest pain.  He complains of nasal congestion and a productive cough and has had some sweats.  He has not had any recent travel.  His weight stable has been stable.  His appetite has been unchanged.  The patient does report that he has been caring for his mother recently. She lives in an older house, that tends to exacerbate his breathing troubles.  The patient says that the albuterol medications and oxygen make him feel better.  He received one dose of Solu-Medrol 125 mg IV in the emergency department and has received multiple albuterol nebulizer treatments throughout the past 24 hours.  The patient has had tachycardia secondary to these medications.  He currently smokes about 3 cigarettes a day.  He has been smoking for many years.  PAST MEDICAL HISTORY:  Significant for at tobacco use, erectile dysfunction, COPD, and  hypertension.  ALLERGIES:  SPIRIVA  PAST SURGICAL HISTORY:  None.  FAMILY HISTORY:  The patient's mother has asthma and Alzheimer's.  The patient's father has hypertension, bladder cancer, and heart disease.  SOCIAL HISTORY:  The patient is on disability secondary to his lung disease.  He worked as a Retail banker.  He is married.  His wife is a Engineer, civil (consulting).  He is born and raised in [place], had 9th grade education.  He smoked three to half-a-pack a day for 30 years.  He quit in 2009, but is currently smoking about three cigarettes a day.  The patient used to drink heavily, but now only drinks on occasion.  He denies any illicit drug use.  REVIEW OF SYSTEMS:  Negative, except HPI.  PHYSICAL EXAM:  VITAL SIGNS:  Temperature 98.3, pulse 124, blood pressure 144/86, respiratory rate 20, and pulse ox 98%, on continuous albuterol treatment. GENERAL:  The patient is alert and cooperative.  He is in moderate distress. EYES:  Conjunctive are clear.  Pupils equal, round, reactive to light. Extraocular movements intact. THROAT:  Oral mucosa moist.  No lesions or exudates. NECK:  No adenopathy.  No bruits.  No JVD.  Trachea midline.  Thyroid not enlarged. LUNGS:  The patient has diffuse wheezes throughout all lung fields.  He has very poor air movement.  Chest wall has no tenderness to palpation. HEART:  The patient is tachycardic and regular rhythm.  No murmurs, rubs, or gallops heard. ABDOMEN:  Soft, nontender.  Positive bowel sounds.  No masses, no organomegaly. EXTREMITIES:  Normal, atraumatic.  No cyanosis or edema.  Pulses are 2+ and symmetric in all four extremities. NEURO:  Grossly normal.  LABS AND STUDIES:  Chest x-ray showed no active cardiopulmonary disease.  CBC:  White blood cell count 7.4, hemoglobin 14.8, hematocrit 42.1, platelets 264, neutrophils 94%, lymphocytes 6%.  Basic metabolic panel: Sodium 138, potassium 3.7, chloride 106, CO2 of 17, BUN 24,  creatinine 1.27.  ASSESSMENT/PLAN:  This is a 55 year old with chronic obstructive pulmonary disease exacerbation, severe requiring inpatient admission. 1. Pulmonary:  We will admit him to the Step-Down Unit for close     monitoring and we will send blood cultures.  We will use     supplemental oxygen to keep pulse ox greater than 92%.  We will     give continuous albuterol until the patient is able to be space to     intermittent treatments.  We will continue corticosteroids.  We     will give Avelox 400 mg p.o. daily.  We will continue the patient's     home Advair. 2. Tachycardia.  This is likely secondary to the patient's albuterol,     but we will order an EKG, cardiac enzymes to ensure he is     tolerating tachycardia.  We will give his home Norvasc and p.r.n.     metoprolol for tachycardia or hypertension.  If available, we will     continue his home aspirin. 3. FEN, GI:  We will keep the patient n.p.o. except medications.  We     will continue his albuterol treatment.  We will give him     maintenance IV fluids with normal saline with 20 mEq of KCl     monitor.  We will monitor his electrolytes closely.  We will give     Protonix 40 mg p.o. daily for GI prophylaxis. 4. DVT prophylaxis.  Heparin 5000 units subcu t.i.d. 5. Disposition is pending clinical improvement.    ______________________________ Ardyth Gal, MD   ______________________________ Leighton Roach Halei Hanover, M.D.    CR/MEDQ  D:  07/29/2011  T:  07/29/2011  Job:  [id number]  cc:   Leighton Roach Ethelle Ola, M.D.  Electronically Signed by Ardyth Gal MD on 08/17/2011 10:08:07 AM Electronically Signed by Acquanetta Belling M.D. on 08/22/2011 08:13:40 AM

## 2011-10-15 ENCOUNTER — Other Ambulatory Visit: Payer: Self-pay | Admitting: Family Medicine

## 2011-11-10 ENCOUNTER — Ambulatory Visit: Payer: Medicare Other | Admitting: Family Medicine

## 2011-11-22 ENCOUNTER — Encounter: Payer: Self-pay | Admitting: Nurse Practitioner

## 2011-11-22 ENCOUNTER — Emergency Department (HOSPITAL_COMMUNITY): Payer: Medicare Other

## 2011-11-22 ENCOUNTER — Emergency Department (HOSPITAL_COMMUNITY)
Admission: EM | Admit: 2011-11-22 | Discharge: 2011-11-22 | Disposition: A | Discharge status: Home / Self Care | Payer: Medicare Other | Attending: Emergency Medicine | Admitting: Emergency Medicine

## 2011-11-22 DIAGNOSIS — R05 Cough: Secondary | ICD-10-CM | POA: Insufficient documentation

## 2011-11-22 DIAGNOSIS — R0682 Tachypnea, not elsewhere classified: Secondary | ICD-10-CM | POA: Insufficient documentation

## 2011-11-22 DIAGNOSIS — R070 Pain in throat: Secondary | ICD-10-CM | POA: Insufficient documentation

## 2011-11-22 DIAGNOSIS — J3489 Other specified disorders of nose and nasal sinuses: Secondary | ICD-10-CM | POA: Insufficient documentation

## 2011-11-22 DIAGNOSIS — Z79899 Other long term (current) drug therapy: Secondary | ICD-10-CM | POA: Insufficient documentation

## 2011-11-22 DIAGNOSIS — I10 Essential (primary) hypertension: Secondary | ICD-10-CM | POA: Insufficient documentation

## 2011-11-22 DIAGNOSIS — R059 Cough, unspecified: Secondary | ICD-10-CM | POA: Insufficient documentation

## 2011-11-22 DIAGNOSIS — J441 Chronic obstructive pulmonary disease with (acute) exacerbation: Secondary | ICD-10-CM | POA: Insufficient documentation

## 2011-11-22 DIAGNOSIS — R0602 Shortness of breath: Secondary | ICD-10-CM | POA: Insufficient documentation

## 2011-11-22 DIAGNOSIS — J069 Acute upper respiratory infection, unspecified: Secondary | ICD-10-CM | POA: Insufficient documentation

## 2011-11-22 DIAGNOSIS — Z7982 Long term (current) use of aspirin: Secondary | ICD-10-CM | POA: Insufficient documentation

## 2011-11-22 HISTORY — DX: Essential (primary) hypertension: I10

## 2011-11-22 HISTORY — DX: Chronic obstructive pulmonary disease, unspecified: J44.9

## 2011-11-22 MED ORDER — PREDNISONE 20 MG PO TABS
60.0000 mg | ORAL_TABLET | Freq: Once | ORAL | Status: AC
  Administered 2011-11-22: 60 mg via ORAL
  Filled 2011-11-22: qty 3

## 2011-11-22 MED ORDER — AZITHROMYCIN 250 MG PO TABS: 250.0000 mg | ORAL_TABLET | Freq: Every day | ORAL | Status: DC

## 2011-11-22 MED ORDER — IPRATROPIUM BROMIDE 0.02 % IN SOLN
0.5000 mg | Freq: Once | RESPIRATORY_TRACT | Status: AC
  Administered 2011-11-22: 0.5 mg via RESPIRATORY_TRACT
  Filled 2011-11-22 (×2): qty 2.5

## 2011-11-22 MED ORDER — ALBUTEROL SULFATE (5 MG/ML) 0.5% IN NEBU
5.0000 mg | INHALATION_SOLUTION | Freq: Once | RESPIRATORY_TRACT | Status: AC
  Administered 2011-11-22: 5 mg via RESPIRATORY_TRACT
  Filled 2011-11-22: qty 0.5

## 2011-11-22 MED ORDER — PREDNISONE 20 MG PO TABS: 40.0000 mg | ORAL_TABLET | Freq: Every day | ORAL | Status: DC

## 2011-11-22 NOTE — ED Notes (Signed)
Pt states he feels much better.  Ambulatory to discharge.

## 2011-11-22 NOTE — ED Notes (Signed)
C/o cough/cold symptoms over past few days and now is feeling sob. Hx COPD. A&Ox4, able to speak in full sentences

## 2011-11-22 NOTE — ED Provider Notes (Signed)
History     CSN: [id number]  Arrival date & time 11/22/11  1406   First MD Initiated Contact with Patient 11/22/11 1500      Chief Complaint  Patient presents with  . Shortness of Breath  . URI    (Consider location/radiation/quality/duration/timing/severity/associated sxs/prior treatment) Patient is a 56 y.o. male presenting with shortness of breath and URI. The history is provided by the patient.  Shortness of Breath  The current episode started more than 1 week ago. The onset was gradual. The problem occurs continuously. The problem has been gradually worsening. The problem is moderate. The symptoms are relieved by beta-agonist inhalers and rest. The symptoms are aggravated by activity. Associated symptoms include rhinorrhea, sore throat, cough, shortness of breath and wheezing. Pertinent negatives include no chest pain, no chest pressure and no fever. The cough is productive. He has had intermittent steroid use. He has had no prior hospitalizations. He has had no prior ICU admissions. He has had no prior intubations. His past medical history is significant for past wheezing. There were sick contacts at home. He has received no recent medical care.  URI The primary symptoms include sore throat, cough and wheezing. Primary symptoms do not include fever.  Symptoms associated with the illness include rhinorrhea.    Past Medical History  Diagnosis Date  . COPD (chronic obstructive pulmonary disease)   . Hypertension     History reviewed. No pertinent past surgical history.  History reviewed. No pertinent family history.  History  Substance Use Topics  . Smoking status: Current Some Day Smoker  . Smokeless tobacco: Not on file  . Alcohol Use: Yes     rare      Review of Systems  Constitutional: Negative for fever.  HENT: Positive for sore throat and rhinorrhea.   Respiratory: Positive for cough, shortness of breath and wheezing.   Cardiovascular: Negative for chest pain.    All other systems reviewed and are negative.    Allergies  Spiriva handihaler  Home Medications   Current Outpatient Rx  Name Route Sig Dispense Refill  . ALBUTEROL SULFATE HFA 108 (90 BASE) MCG/ACT IN AERS Inhalation Inhale 1-2 puffs into the lungs every 4 (four) hours as needed. For shortness of breath.     . ALBUTEROL SULFATE (2.5 MG/3ML) 0.083% IN NEBU Nebulization Take 2.5 mg by nebulization 4 (four) times daily as needed. For shortness of breath     . AMLODIPINE BESYLATE 10 MG PO TABS Oral Take 10 mg by mouth daily.      . ASPIRIN 81 MG PO TBEC Oral Take 81 mg by mouth daily.      Marland Kitchen FEXOFENADINE HCL 180 MG PO TABS Oral Take 180 mg by mouth daily.     Marland Kitchen FLUTICASONE-SALMETEROL 500-50 MCG/DOSE IN AEPB Inhalation Inhale 1 puff into the lungs 2 (two) times daily.       BP 145/93  Pulse 95  Temp(Src) 98.5 F (36.9 C) (Oral)  Resp 16  Ht 5\' 9"  (1.753 m)  Wt 146 lb (66.225 kg)  BMI 21.56 kg/m2  SpO2 96%  Physical Exam  Constitutional: He is oriented to person, place, and time. He appears well-developed and well-nourished. No distress.  HENT:  Head: Normocephalic and atraumatic.  Right Ear: Tympanic membrane, external ear and ear canal normal.  Left Ear: Tympanic membrane, external ear and ear canal normal.  Mouth/Throat: Mucous membranes are normal. Posterior oropharyngeal erythema present. No oropharyngeal exudate or tonsillar abscesses.  Eyes: Conjunctivae and EOM are  normal. Pupils are equal, round, and reactive to light. Right eye exhibits no discharge.  Neck: Normal range of motion. Neck supple.  Cardiovascular: Normal rate, regular rhythm, normal heart sounds and intact distal pulses.   No murmur heard. Pulmonary/Chest: Tachypnea noted. No respiratory distress. He has no decreased breath sounds. He has wheezes in the right upper field, the right middle field, the right lower field, the left upper field, the left middle field and the left lower field. He has no rhonchi.  He has no rales.  Abdominal: Soft. There is no tenderness.  Musculoskeletal: Normal range of motion. He exhibits no edema and no tenderness.  Neurological: He is alert and oriented to person, place, and time.  Skin: Skin is warm and dry. No rash noted.  Psychiatric: He has a normal mood and affect.    ED Course  Procedures (including critical care time)  Labs Reviewed - No data to display Dg Chest 2 View  11/22/2011  *RADIOLOGY REPORT*  Clinical Data: Cough.  Shortness of breath.  Smoker with history of COPD.  CHEST - 2 VIEW 11/22/2011:  Comparison: Two-view chest x-ray 06/27/2011, 09/26/2010, 12/22/2008 [facility].  Findings: Cardiomediastinal silhouette unremarkable and unchanged. Lungs hyperinflated but clear.  No pleural effusions.  Visualized bony thorax intact.  IMPRESSION: Stable hyperinflation consistent with COPD and/or asthma.  No acute cardiopulmonary disease.  Original Report Authenticated By: Arnell Sieving, M.D.     1. COPD exacerbation   2. URI (upper respiratory infection)       MDM   Pt with symptoms consistent with viral URI with a history of COPD.  Well appearing here.  No signs of breathing difficulty or wheezing diffusely on exam. No signs of pharyngitis, otitis or abnormal abdominal findings.   CXR wnl and O2 sats 96% on room air.   Will treat with steroids, albuterol and Atrovent and will recheck.  4:19 PM Patient feeling much better and wheezing resolved.      Gwyneth Sprout, MD 11/22/11 [phone]

## 2011-11-23 ENCOUNTER — Inpatient Hospital Stay (HOSPITAL_COMMUNITY)
Admission: EM | Admit: 2011-11-23 | Discharge: 2011-11-26 | DRG: 192 | Disposition: A | Discharge status: Home / Self Care | Payer: Medicare Other | Attending: Family Medicine | Admitting: Family Medicine

## 2011-11-23 ENCOUNTER — Encounter (HOSPITAL_COMMUNITY): Payer: Self-pay | Admitting: Emergency Medicine

## 2011-11-23 DIAGNOSIS — J441 Chronic obstructive pulmonary disease with (acute) exacerbation: Principal | ICD-10-CM | POA: Diagnosis present

## 2011-11-23 DIAGNOSIS — Z860100 Personal history of colon polyps, unspecified: Secondary | ICD-10-CM

## 2011-11-23 DIAGNOSIS — J069 Acute upper respiratory infection, unspecified: Secondary | ICD-10-CM | POA: Diagnosis present

## 2011-11-23 DIAGNOSIS — J449 Chronic obstructive pulmonary disease, unspecified: Secondary | ICD-10-CM | POA: Diagnosis present

## 2011-11-23 DIAGNOSIS — R0902 Hypoxemia: Secondary | ICD-10-CM

## 2011-11-23 DIAGNOSIS — Z888 Allergy status to other drugs, medicaments and biological substances status: Secondary | ICD-10-CM

## 2011-11-23 DIAGNOSIS — Z79899 Other long term (current) drug therapy: Secondary | ICD-10-CM

## 2011-11-23 DIAGNOSIS — I10 Essential (primary) hypertension: Secondary | ICD-10-CM | POA: Diagnosis present

## 2011-11-23 DIAGNOSIS — F17200 Nicotine dependence, unspecified, uncomplicated: Secondary | ICD-10-CM | POA: Diagnosis present

## 2011-11-23 DIAGNOSIS — Z7982 Long term (current) use of aspirin: Secondary | ICD-10-CM

## 2011-11-23 HISTORY — DX: Shortness of breath: R06.02

## 2011-11-23 LAB — MRSA PCR SCREENING: MRSA by PCR: NEGATIVE

## 2011-11-23 LAB — POCT I-STAT 3, ART BLOOD GAS (G3+)
TCO2: 21 mmol/L (ref 0–100)
pCO2 arterial: 36.9 mmHg (ref 35.0–45.0)
pH, Arterial: 7.338 — ABNORMAL LOW (ref 7.350–7.450)

## 2011-11-23 LAB — CREATININE, SERUM
Creatinine, Ser: 1.03 mg/dL (ref 0.50–1.35)
GFR calc Af Amer: >90 mL/min (ref >90)
GFR calc non Af Amer: 79 mL/min — ABNORMAL LOW (ref >90)

## 2011-11-23 LAB — CBC
MCH: 32.9 pg (ref 26.0–34.0)
MCV: 95.3 fL (ref 78.0–100.0)
MCV: 96.5 fL (ref 78.0–100.0)
Platelets: 250 10*3/uL (ref 150–400)
Platelets: 274 10*3/uL (ref 150–400)
RBC: 4.23 MIL/uL (ref 4.22–5.81)
RBC: 4.25 MIL/uL (ref 4.22–5.81)
RDW: 12.8 % (ref 11.5–15.5)
RDW: 13 % (ref 11.5–15.5)
WBC: 14.9 10*3/uL — ABNORMAL HIGH (ref 4.0–10.5)

## 2011-11-23 LAB — POCT I-STAT, CHEM 8
BUN: 12 mg/dL (ref 6–23)
Calcium, Ion: 1.3 mmol/L (ref 1.12–1.32)
TCO2: 21 mmol/L (ref 0–100)

## 2011-11-23 MED ORDER — IPRATROPIUM BROMIDE 0.02 % IN SOLN
RESPIRATORY_TRACT | Status: AC
  Filled 2011-11-23: qty 5

## 2011-11-23 MED ORDER — IPRATROPIUM BROMIDE 0.02 % IN SOLN
1.0000 mg | Freq: Once | RESPIRATORY_TRACT | Status: AC
  Administered 2011-11-23: 1 mg via RESPIRATORY_TRACT

## 2011-11-23 MED ORDER — ALBUTEROL (5 MG/ML) CONTINUOUS INHALATION SOLN
10.0000 mg/h | INHALATION_SOLUTION | RESPIRATORY_TRACT | Status: AC
  Administered 2011-11-23: 10 mg/h via RESPIRATORY_TRACT

## 2011-11-23 MED ORDER — SODIUM CHLORIDE 0.9 % IV SOLN: 250.0000 mL | INTRAVENOUS | Status: DC | PRN

## 2011-11-23 MED ORDER — HYDROCODONE-ACETAMINOPHEN 5-325 MG PO TABS: 1.0000 | ORAL_TABLET | ORAL | Status: DC | PRN

## 2011-11-23 MED ORDER — FLUTICASONE-SALMETEROL 500-50 MCG/DOSE IN AEPB
1.0000 | INHALATION_SPRAY | Freq: Two times a day (BID) | RESPIRATORY_TRACT | Status: DC
  Administered 2011-11-23 – 2011-11-26 (×7): 1 via RESPIRATORY_TRACT
  Filled 2011-11-23 (×2): qty 14

## 2011-11-23 MED ORDER — MAGNESIUM SULFATE 50 % IJ SOLN
2.0000 g | Freq: Once | INTRAMUSCULAR | Status: DC
Start: 2011-11-23 — End: 2011-11-23

## 2011-11-23 MED ORDER — HEPARIN SODIUM (PORCINE) 5000 UNIT/ML IJ SOLN
5000.0000 [IU] | Freq: Three times a day (TID) | INTRAMUSCULAR | Status: DC
  Administered 2011-11-23 – 2011-11-26 (×8): 5000 [IU] via SUBCUTANEOUS
  Filled 2011-11-23 (×11): qty 1

## 2011-11-23 MED ORDER — PROMETHAZINE HCL 25 MG PO TABS: 12.5000 mg | ORAL_TABLET | Freq: Four times a day (QID) | ORAL | Status: DC | PRN

## 2011-11-23 MED ORDER — ASPIRIN 81 MG PO TBEC
81.0000 mg | DELAYED_RELEASE_TABLET | Freq: Every day | ORAL | Status: DC
  Administered 2011-11-23 – 2011-11-26 (×4): 81 mg via ORAL
  Filled 2011-11-23 (×5): qty 1

## 2011-11-23 MED ORDER — ALBUTEROL SULFATE (5 MG/ML) 0.5% IN NEBU
2.5000 mg | INHALATION_SOLUTION | RESPIRATORY_TRACT | Status: DC
  Administered 2011-11-23 – 2011-11-25 (×14): 2.5 mg via RESPIRATORY_TRACT
  Filled 2011-11-23 (×5): qty 0.5
  Filled 2011-11-23: qty 2
  Filled 2011-11-23 (×8): qty 0.5

## 2011-11-23 MED ORDER — MOXIFLOXACIN HCL IN NACL 400 MG/250ML IV SOLN
400.0000 mg | INTRAVENOUS | Status: DC
  Administered 2011-11-23 – 2011-11-25 (×3): 400 mg via INTRAVENOUS
  Filled 2011-11-23 (×3): qty 250

## 2011-11-23 MED ORDER — ASPIRIN 81 MG PO CHEW
CHEWABLE_TABLET | ORAL | Status: AC
  Filled 2011-11-23: qty 4

## 2011-11-23 MED ORDER — AMLODIPINE BESYLATE 10 MG PO TABS
10.0000 mg | ORAL_TABLET | Freq: Every day | ORAL | Status: DC
  Administered 2011-11-23 – 2011-11-26 (×4): 10 mg via ORAL
  Filled 2011-11-23 (×3): qty 1

## 2011-11-23 MED ORDER — PROMETHAZINE HCL 25 MG/ML IJ SOLN
12.5000 mg | Freq: Four times a day (QID) | INTRAMUSCULAR | Status: DC | PRN
  Filled 2011-11-23: qty 1

## 2011-11-23 MED ORDER — PREDNISONE 20 MG PO TABS
40.0000 mg | ORAL_TABLET | Freq: Once | ORAL | Status: AC
  Administered 2011-11-23: 40 mg via ORAL

## 2011-11-23 MED ORDER — ACETAMINOPHEN 650 MG RE SUPP: 650.0000 mg | Freq: Four times a day (QID) | RECTAL | Status: DC | PRN

## 2011-11-23 MED ORDER — ALBUTEROL SULFATE (5 MG/ML) 0.5% IN NEBU
2.5000 mg | INHALATION_SOLUTION | RESPIRATORY_TRACT | Status: DC | PRN
  Administered 2011-11-24: 2.5 mg via RESPIRATORY_TRACT
  Filled 2011-11-23: qty 0.5

## 2011-11-23 MED ORDER — ALBUTEROL (5 MG/ML) CONTINUOUS INHALATION SOLN
15.0000 mg | INHALATION_SOLUTION | RESPIRATORY_TRACT | Status: DC
  Administered 2011-11-23: 15 mg via RESPIRATORY_TRACT

## 2011-11-23 MED ORDER — AMLODIPINE BESYLATE 10 MG PO TABS
10.0000 mg | ORAL_TABLET | ORAL | Status: AC
  Filled 2011-11-23: qty 1

## 2011-11-23 MED ORDER — GUAIFENESIN ER 600 MG PO TB12
600.0000 mg | ORAL_TABLET | Freq: Two times a day (BID) | ORAL | Status: DC
  Administered 2011-11-23 – 2011-11-26 (×6): 600 mg via ORAL
  Filled 2011-11-23 (×8): qty 1

## 2011-11-23 MED ORDER — PREDNISONE 20 MG PO TABS
ORAL_TABLET | ORAL | Status: AC
  Filled 2011-11-23: qty 2

## 2011-11-23 MED ORDER — ALBUTEROL SULFATE (5 MG/ML) 0.5% IN NEBU
INHALATION_SOLUTION | RESPIRATORY_TRACT | Status: AC
  Filled 2011-11-23: qty 3

## 2011-11-23 MED ORDER — SODIUM CHLORIDE 0.9 % IJ SOLN
3.0000 mL | Freq: Two times a day (BID) | INTRAMUSCULAR | Status: DC
  Administered 2011-11-23 – 2011-11-26 (×5): 3 mL via INTRAVENOUS

## 2011-11-23 MED ORDER — POTASSIUM CHLORIDE CRYS ER 20 MEQ PO TBCR
40.0000 meq | EXTENDED_RELEASE_TABLET | Freq: Every day | ORAL | Status: DC
  Administered 2011-11-23 – 2011-11-26 (×4): 40 meq via ORAL
  Filled 2011-11-23 (×2): qty 2
  Filled 2011-11-23 (×2): qty 1
  Filled 2011-11-23: qty 2

## 2011-11-23 MED ORDER — PREDNISONE 20 MG PO TABS
40.0000 mg | ORAL_TABLET | Freq: Every day | ORAL | Status: DC
  Administered 2011-11-23: 40 mg via ORAL
  Filled 2011-11-23 (×2): qty 2

## 2011-11-23 MED ORDER — SODIUM CHLORIDE 0.9 % IJ SOLN: 3.0000 mL | INTRAMUSCULAR | Status: DC | PRN

## 2011-11-23 MED ORDER — ALBUTEROL SULFATE (5 MG/ML) 0.5% IN NEBU
5.0000 mg | INHALATION_SOLUTION | Freq: Once | RESPIRATORY_TRACT | Status: AC
  Administered 2011-11-23: 5 mg via RESPIRATORY_TRACT
  Filled 2011-11-23: qty 1

## 2011-11-23 MED ORDER — MAGNESIUM SULFATE 40 MG/ML IJ SOLN
2.0000 g | INTRAMUSCULAR | Status: AC
  Administered 2011-11-23: 2 g via INTRAVENOUS
  Filled 2011-11-23: qty 50

## 2011-11-23 MED ORDER — ACETAMINOPHEN 325 MG PO TABS: 650.0000 mg | ORAL_TABLET | Freq: Four times a day (QID) | ORAL | Status: DC | PRN

## 2011-11-23 NOTE — ED Notes (Signed)
MD notified by resp therapist re: increased labored resp and resp distress.

## 2011-11-23 NOTE — H&P (Signed)
PCP: [facility]  CC: COPD exacerbation  HPI: He is a 56 y.o. male presents with an exacerbation of COPD. Symptoms include difficulty breathing, dry cough, dyspnea on exertion and tightness in chest.  Symptoms began 2 days ago, gradually worsening since.  He denies chest pain, hemoptysis and productive cough. Associated symptoms include rhinorrhea and sneezing for two weeks.  He has not had recent travel.  Weight has been stable.  Appetite has been unaffected. Symptoms are exacerbated by exposure to cold air and smoking.  Symptoms are alleviated by rest and medication(s) (albuterol). He has treated this current exacerbation with: albuterol, prednisone and azithromycin following evaluation in the ED on 11/22/11.  He went home for a few hours and returned with worsening chest tightness and increased work of breathing.  His COPD history is significant for two intubations in the past. His last exacerbation was in 9/12.  He is still smoking but has cut back to 2-3 cigarettes per day.   ED course:  1 hr albuterol neb x 1, magnesium sulfate 2 mg IV x 1, and albuterol neb x 1 (2.5 hrs after previous treatment).   Patient Active Problem List  Diagnoses Date Noted  . COPD exacerbation 11/23/2011  . TOBACCO USER 08/04/2009  . ERECTILE DYSFUNCTION 12/31/2008  . COPD 12/31/2008  . COLON POLYP 01/18/2007  . HYPERTENSION, BENIGN SYSTEMIC 01/18/2007   Past Medical History  Diagnosis Date  . COPD (chronic obstructive pulmonary disease)   . Hypertension   . Asthma   . Shortness of breath     Past Surgical History  Procedure Date  . Colonoscopy      Current Outpatient Prescriptions on File Prior to Encounter  Medication Sig Dispense Refill  . albuterol (PROVENTIL HFA;VENTOLIN HFA) 108 (90 BASE) MCG/ACT inhaler Inhale 1-2 puffs into the lungs every 4 (four) hours as needed. For shortness of breath.       Marland Kitchen albuterol (PROVENTIL) (2.5 MG/3ML) 0.083% nebulizer solution Take 2.5 mg by nebulization 4 (four) times  daily as needed. For shortness of breath       . amLODipine (NORVASC) 10 MG tablet Take 10 mg by mouth daily.        Marland Kitchen aspirin 81 MG EC tablet Take 81 mg by mouth daily.        Marland Kitchen azithromycin (ZITHROMAX) 250 MG tablet Take 1 tablet (250 mg total) by mouth daily. Take first 2 tablets together, then 1 every day until finished.  6 tablet  0  . fexofenadine (ALLEGRA) 180 MG tablet Take 180 mg by mouth daily.       . Fluticasone-Salmeterol (ADVAIR DISKUS) 500-50 MCG/DOSE AEPB Inhale 1 puff into the lungs 2 (two) times daily.         Allergies  Allergen Reactions  . Spiriva Handihaler     hallucinations    History  Substance Use Topics  . Smoking status: Current Some Day Smoker -- 0.2 packs/day for 30 years    Types: Cigarettes  . Smokeless tobacco: Never Used  . Alcohol Use: Yes     rare    History reviewed. No pertinent family history.  Review of Systems Pertinent items are noted in HPI.  Objective: Vital signs: Temp:  [98.1 F (36.7 C)-98.5 F (36.9 C)] 98.1 F (36.7 C) (01/02 0749) Pulse Rate:  [95-123] 116  (01/02 0749) Resp:  [13-24] 14  (01/02 0749) BP: (110-168)/(68-94) 161/84 mmHg (01/02 0749) SpO2:  [94 %-100 %] 94 % (01/02 0749) FiO2 (%):  [95 %] 95 % (  01/02 0835) Weight:  [146 lb (66.225 kg)] 146 lb (66.225 kg) (01/02 0450)  General appearance: alert, cooperative, appears stated age and no distress Head: Normocephalic, without obvious abnormality, atraumatic Nose: Nares normal. Septum midline. Mucosa normal. No drainage or sinus tenderness. Throat: lips, mucosa, and tongue normal; teeth and gums normal Lungs: normal WOB. No use of accessory muscles. Diffuse expiratory wheezing.  Heart: regular rate and rhythm, S1, S2 normal, no murmur, click, rub or gallop Abdomen: soft, non-tender; bowel sounds normal; no masses,  no organomegaly Extremities: extremities normal, atraumatic, no cyanosis or edema Skin: Skin color, texture, turgor normal. No rashes or  lesions Neurologic: Grossly normal  Chest X-Ray: hyperinflation/airtrapping. No infiltrate.  Tele: occasional PVCs, non-sustained.   Data Review: WBC 12.3  Hgb 15.3 Glucose 149 K+ 3.6   Assessment/Plan:   1. Exacerbation of COPD, triggered by viral URI and smoking.  P:  Admit to telemetry bed, stepdown unit given history of requiring intubation in the past . Levofloxacin (Levaquin) IV. Electrolyte correction, schedule potassium.  Supplemental oxygen to keep SaO2 > 92%. Corticosteroids Prednisone 40  Mg PO q D  Albuterol nebs q4/q2 prn.  Continue home advair, step up dose at D/C.  Smoking cessation consult   2. Hypertension: Elevated BPs. Continue home Norvasc increase dose to 20 mg PO q D.   3. FEN/GI: heart healthy diet. F/u K+ with AM BMP. SLIV.  4. DVT PPx: Heparin [place].   5. Dispo: anticipate transition from SDU to floor bed in 1 day and home in 2-3 days.    Byan Poplaski 10:22 AM 11/23/11

## 2011-11-23 NOTE — ED Notes (Signed)
After eating breakfast, Pt c/o "tightening" with breathing. O2 sats remain 95% on 2 lnc. Using some accessory muscles. Dimiisned breath sounds bilaterally. Signed orders released for prn treatments; Resp therapist clled to give pt treatment.

## 2011-11-23 NOTE — ED Notes (Signed)
Dr Lanny Cramp here to see pt. Orders received and entered by resp therapist.

## 2011-11-23 NOTE — ED Notes (Signed)
Pt states he feels "much better than when I came in."

## 2011-11-23 NOTE — ED Provider Notes (Addendum)
History     CSN: [id number]  Arrival date & time 11/23/11  0440   First MD Initiated Contact with Patient 11/23/11 0455      Chief Complaint  Patient presents with  . Shortness of Breath    (Consider location/radiation/quality/duration/timing/severity/associated sxs/prior treatment) HPI Complains of shortness of breath onset 2 PM yesterday. Seen here yesterday . Receive prescriptions for prednisone and Zithromax. Received prednisone in the emergency department yesterday. Discharge the emergency department yesterday afternoon feeling improved. Breathing worsened this morning. Denies fever denies cough denies other complaint symptoms feel like COPD which she's had for the past 8 years area no other associated symptom Past Medical History  Diagnosis Date  . COPD (chronic obstructive pulmonary disease)   . Hypertension   . Asthma     History reviewed. No pertinent past surgical history.  No family history on file.  History  Substance Use Topics  . Smoking status: Current Some Day Smoker  . Smokeless tobacco: Not on file  . Alcohol Use: Yes     rare      Review of Systems  Constitutional: Negative.   HENT: Negative.   Respiratory: Positive for shortness of breath.   Cardiovascular: Negative.   Gastrointestinal: Negative.   Musculoskeletal: Negative.   Skin: Negative.   Neurological: Negative.   Hematological: Negative.   Psychiatric/Behavioral: Negative.   All other systems reviewed and are negative.    Allergies  Spiriva handihaler  Home Medications   Current Outpatient Rx  Name Route Sig Dispense Refill  . ALBUTEROL SULFATE HFA 108 (90 BASE) MCG/ACT IN AERS Inhalation Inhale 1-2 puffs into the lungs every 4 (four) hours as needed. For shortness of breath.     . ALBUTEROL SULFATE (2.5 MG/3ML) 0.083% IN NEBU Nebulization Take 2.5 mg by nebulization 4 (four) times daily as needed. For shortness of breath     . AMLODIPINE BESYLATE 10 MG PO TABS Oral Take 10 mg by  mouth daily.      . ASPIRIN 81 MG PO TBEC Oral Take 81 mg by mouth daily.      . AZITHROMYCIN 250 MG PO TABS Oral Take 1 tablet (250 mg total) by mouth daily. Take first 2 tablets together, then 1 every day until finished. 6 tablet 0  . FEXOFENADINE HCL 180 MG PO TABS Oral Take 180 mg by mouth daily.     Marland Kitchen FLUTICASONE-SALMETEROL 500-50 MCG/DOSE IN AEPB Inhalation Inhale 1 puff into the lungs 2 (two) times daily.     Marland Kitchen PREDNISONE 20 MG PO TABS Oral Take 40 mg by mouth daily. Has not started this medication yet       BP 150/75  Pulse 108  Resp 13  Ht 5\' 9"  (1.753 m)  Wt 146 lb (66.225 kg)  BMI 21.56 kg/m2  SpO2 100%  Physical Exam  Nursing note and vitals reviewed. Constitutional: He appears well-developed and well-nourished. No distress.  HENT:  Head: Normocephalic and atraumatic.  Eyes: Conjunctivae are normal. Pupils are equal, round, and reactive to light.  Neck: Neck supple. No tracheal deviation present. No thyromegaly present.  Cardiovascular: Normal rate and regular rhythm.   No murmur heard. Pulmonary/Chest: He has wheezes.       Prolonged expiratory phase with expiratory wheezes. Speaks in paragraphs  Abdominal: Soft. Bowel sounds are normal. He exhibits no distension. There is no tenderness.  Musculoskeletal: Normal range of motion. He exhibits no edema and no tenderness.  Neurological: He is alert. Coordination normal.  Skin: Skin is warm  and dry. No rash noted.  Psychiatric: He has a normal mood and affect.    ED Course  Procedures (including critical care time) 5:20 a.m. breathing improved after nebulized treatment. I exam patient appears comfortable speaks in sentences no respiratory distress states breathing is not at baseline .Prednisone and continuous nebulization ordered. 7 AM patient more dyspneic after continuous nebulization . additional albuterol 5 mg nebulizer treatment ordered magnesium sulfate 2 g IV ordered by me. Spoke with family practice resident  physician, who will arrange for admission Labs Reviewed - No data to display Dg Chest 2 View  11/22/2011  *RADIOLOGY REPORT*  Clinical Data: Cough.  Shortness of breath.  Smoker with history of COPD.  CHEST - 2 VIEW 11/22/2011:  Comparison: Two-view chest x-ray 06/27/2011, 09/26/2010, 12/22/2008 [facility].  Findings: Cardiomediastinal silhouette unremarkable and unchanged. Lungs hyperinflated but clear.  No pleural effusions.  Visualized bony thorax intact.  IMPRESSION: Stable hyperinflation consistent with COPD and/or asthma.  No acute cardiopulmonary disease.  Original Report Authenticated By: Arnell Sieving, M.D.     No diagnosis found.    MDM  Family practice resident to arrange for hospital admission Diagnosis #1 exacerbation of COPD #2 tobacco abuse   CRITICAL CARE Performed by: Doug Sou   Total critical care time: 30 minutes  Critical care time was exclusive of separately billable procedures and treating other patients.  Critical care was necessary to treat or prevent imminent or life-threatening deterioration.  Critical care was time spent personally by me on the following activities: development of treatment plan with patient and/or surrogate as well as nursing, discussions with consultants, evaluation of patient's response to treatment, examination of patient, obtaining history from patient or surrogate, ordering and performing treatments and interventions, ordering and review of laboratory studies, ordering and review of radiographic studies, pulse oximetry and re-evaluation of patient's condition.     Doug Sou, MD 11/23/11 [id number]  Doug Sou, MD 11/23/11 [phone]

## 2011-11-23 NOTE — ED Notes (Signed)
Resp therapist called to give pt breathing treatment.

## 2011-11-23 NOTE — H&P (Signed)
Family Medicine Teaching Service Attending Note  I interviewed and examined patient [name] and reviewed their tests and x-rays.  I discussed with Dr. Armen Pickup and reviewed their note for today.  I agree with their assessment and plan.     Additionally  Currently resting easily without wheezing on lung exam.  95% on 2 liters Agree with COPD exacerbation treatment with close observation given previous intubations although they were years ago

## 2011-11-23 NOTE — ED Notes (Signed)
Pt with increased sinus tachy rate to 140s after breathing treatment. Pt states he feels slightly better.

## 2011-11-23 NOTE — ED Notes (Signed)
MD at bedside. 

## 2011-11-23 NOTE — ED Notes (Signed)
Patient with shortness of breath since yesterday.  Patient very irritable, seen yesterday for same.

## 2011-11-23 NOTE — ED Notes (Signed)
Report called to TAta. Stat ABGs and CXR to be done prior to transfer to step down.

## 2011-11-24 ENCOUNTER — Inpatient Hospital Stay (HOSPITAL_COMMUNITY): Payer: Medicare Other

## 2011-11-24 LAB — BASIC METABOLIC PANEL
BUN: 11 mg/dL (ref 6–23)
Creatinine, Ser: 1.05 mg/dL (ref 0.50–1.35)
GFR calc Af Amer: 89 mL/min — ABNORMAL LOW (ref >90)
GFR calc non Af Amer: 77 mL/min — ABNORMAL LOW (ref >90)

## 2011-11-24 LAB — BLOOD GAS, ARTERIAL
Acid-Base Excess: 1.3 mmol/L (ref 0.0–2.0)
Acid-Base Excess: 1.7 mmol/L (ref 0.0–2.0)
Bicarbonate: 26.5 mEq/L — ABNORMAL HIGH (ref 20.0–24.0)
Drawn by: 31101
Inspiratory PAP: 13
TCO2: 27.8 mmol/L (ref 0–100)
TCO2: 27.9 mmol/L (ref 0–100)
pCO2 arterial: 49.4 mmHg — ABNORMAL HIGH (ref 35.0–45.0)
pCO2 arterial: 47.4 mmHg — ABNORMAL HIGH (ref 35.0–45.0)
pH, Arterial: 7.346 — ABNORMAL LOW (ref 7.350–7.450)
pH, Arterial: 7.366 (ref 7.350–7.450)
pO2, Arterial: 46 mmHg — ABNORMAL LOW (ref 80.0–100.0)
pO2, Arterial: 39.8 mmHg — CL (ref 80.0–100.0)

## 2011-11-24 MED ORDER — PREDNISONE 10 MG PO TABS
60.0000 mg | ORAL_TABLET | Freq: Every day | ORAL | Status: DC
  Administered 2011-11-24 – 2011-11-26 (×3): 60 mg via ORAL
  Filled 2011-11-24 (×4): qty 1

## 2011-11-24 MED ORDER — NICOTINE 7 MG/24HR TD PT24
7.0000 mg | MEDICATED_PATCH | Freq: Every day | TRANSDERMAL | Status: DC | PRN
  Administered 2011-11-24: 7 mg via TRANSDERMAL
  Filled 2011-11-24: qty 1

## 2011-11-24 NOTE — Progress Notes (Signed)
Family Medicine Teaching Service Attending Note  I interviewed and examined patient [name] and reviewed their tests and x-rays.  I discussed with Dr. Candi Leash and reviewed their note for today.  I agree with their assessment and plan.     Additionally  Feeling better this am.  Keep in step down overnight given worsening yesterday

## 2011-11-24 NOTE — Progress Notes (Signed)
Respiratory therapist reports possible venous mixing in recently obtained blood gas and thus ordered additional blood gas, order was placed

## 2011-11-24 NOTE — Progress Notes (Signed)
PGY-1 Daily Progress Note Family Medicine Teaching Service Ulyssa Walthour M. Crystie Yanko, MD Service Pager: [phone]  Subjective: Patient weaned off bipap to nasal canula this morning. States he is feeling tired, but better than he did at admission. No acute events overnight.  Objective: Vital signs in last 24 hours: Temp:  [98.1 F (36.7 C)-100.9 F (38.3 C)] 100 F (37.8 C) (01/03 0845) Pulse Rate:  [97-140] 117  (01/03 0845) Resp:  [17-32] 21  (01/03 0845) BP: (119-188)/(58-121) 119/58 mmHg (01/03 0845) SpO2:  [94 %-100 %] 94 % (01/03 0845) FiO2 (%):  [28 %-96 %] 60 % (01/03 0755) Weight:  [144 lb 6.4 oz (65.5 kg)] 144 lb 6.4 oz (65.5 kg) (01/02 1750) Weight change: -1 lb 9.6 oz (-0.725 kg) Last BM Date: 11/22/11  Intake/Output from previous day: 01/02 0701 - 01/03 0700 In: 280 [I.V.:280] Out: 1950 [Urine:1950] Intake/Output this shift: Total I/O In: 10 [I.V.:10] Out: 100 [Urine:100]  General appearance: alert, cooperative and no distress Head: Normocephalic, without obvious abnormality, atraumatic Neck: no adenopathy and supple, symmetrical, trachea midline Resp: diminished breath sounds bilaterally and wheezes bilaterally Cardio: regular rhythm, S1, S2 normal, no murmur, click, rub or gallop and tachycardia GI: soft, non-tender; bowel sounds normal; no masses,  no organomegaly Extremities: extremities normal, atraumatic, no cyanosis or edema Neurologic: Grossly normal  Lab Results:  Basename 11/23/11 1958 11/23/11 0745 11/23/11 0730  WBC 14.9* -- 12.3*  HGB 13.8 15.3 --  HCT 41.0 45.0 --  PLT 274 -- 250   BMET  Basename 11/24/11 0320 11/23/11 1958 11/23/11 0745  NA 141 -- 144  K 3.9 -- 3.6  CL 108 -- 111  CO2 26 -- --  GLUCOSE 94 -- 149*  BUN 11 -- 12  CREATININE 1.05 1.03 --  CALCIUM 9.7 -- --    Studies/Results: Dg Chest 2 View  11/22/2011  *RADIOLOGY REPORT*  Clinical Data: Cough.  Shortness of breath.  Smoker with history of COPD.  CHEST - 2 VIEW  11/22/2011:  Comparison: Two-view chest x-ray 06/27/2011, 09/26/2010, 12/22/2008 [facility].  Findings: Cardiomediastinal silhouette unremarkable and unchanged. Lungs hyperinflated but clear.  No pleural effusions.  Visualized bony thorax intact.  IMPRESSION: Stable hyperinflation consistent with COPD and/or asthma.  No acute cardiopulmonary disease.  Original Report Authenticated By: Arnell Sieving, M.D.    Medications:  I have reviewed the patient's current medications. Scheduled:   . albuterol  2.5 mg Nebulization Q4H  . amLODipine  10 mg Oral Daily  . amLODipine  10 mg Oral To Minor  . aspirin  81 mg Oral Daily  . Fluticasone-Salmeterol  1 puff Inhalation BID  . guaiFENesin  600 mg Oral BID  . heparin  5,000 Units Subcutaneous Q8H  . ipratropium      . moxifloxacin  400 mg Intravenous Q24H  . potassium chloride  40 mEq Oral Daily  . predniSONE      . predniSONE  60 mg Oral Q breakfast  . sodium chloride  3 mL Intravenous Q12H  . DISCONTD: predniSONE  40 mg Oral Daily   Continuous:   . albuterol 10 mg/hr (11/23/11 1607)  . DISCONTD: albuterol Stopped (11/23/11 0545)   JXB:JYNWGN chloride, acetaminophen, acetaminophen, albuterol, HYDROcodone-acetaminophen, promethazine, promethazine, sodium chloride  Assessment/Plan: 1. COPD Exacerbation.- Likely triggered by viral URI and smoking.  - Levofloxacin (Levaquin) IV.  - Electrolyte correction, schedule potassium.  - Supplemental oxygen to keep SaO2 > 92%. Doing well on [place] at this time. - Corticosteroids Prednisone 40 Mg PO  q D  - Albuterol nebs q4/q2 prn. (Encouraged him to ask for treatment as needed) - Continue home advair, step up dose at D/C.  - Smoking cessation consult   2. Hypertension: BP 142/78 this morning. Continue home Norvasc increase dose to 20 mg PO q D.   3. FEN/GI: heart healthy diet. F/u K+ with AM BMP. SLIV.   4. DVT PPx: Heparin [place].   5. Dispo: Once stable on [place], transfer to floor. Continue  treatments and smoking cessation counseling. Discharge pending further clinical improvement.   LOS: 1 day   Tallula Grindle 11/24/2011, 8:51 AM

## 2011-11-24 NOTE — Progress Notes (Signed)
CRITICAL VALUE ALERT  Critical value received: PO2 39.8  Date of notification:  11/24/2011  Time of notification:  5:45 AM   Critical value read back: yes  Nurse who received alert:  Birdena Crandall  MD notified (1st page):  Signe Colt  Time of first page:  5:47 AM  MD notified (2nd page):  Time of second page:  Responding MD:  Antoine Primas  Time MD responded:  5:49 AM

## 2011-11-25 LAB — COMPREHENSIVE METABOLIC PANEL
ALT: 24 U/L (ref 0–53)
AST: 22 U/L (ref 0–37)
Albumin: 3.6 g/dL (ref 3.5–5.2)
Alkaline Phosphatase: 66 U/L (ref 39–117)
Calcium: 9.4 mg/dL (ref 8.4–10.5)
Potassium: 3.8 mEq/L (ref 3.5–5.1)
Sodium: 139 mEq/L (ref 135–145)
Total Protein: 7 g/dL (ref 6.0–8.3)

## 2011-11-25 LAB — CBC
HCT: 42.4 % (ref 39.0–52.0)
Hemoglobin: 13.8 g/dL (ref 13.0–17.0)
MCHC: 32.5 g/dL (ref 30.0–36.0)
MCV: 97.9 fL (ref 78.0–100.0)
RDW: 12.9 % (ref 11.5–15.5)

## 2011-11-25 MED ORDER — MOXIFLOXACIN HCL 400 MG PO TABS
400.0000 mg | ORAL_TABLET | Freq: Every day | ORAL | Status: DC
  Administered 2011-11-25: 400 mg via ORAL
  Filled 2011-11-25 (×3): qty 1

## 2011-11-25 MED ORDER — ALBUTEROL SULFATE HFA 108 (90 BASE) MCG/ACT IN AERS
2.0000 | INHALATION_SPRAY | RESPIRATORY_TRACT | Status: DC | PRN
  Administered 2011-11-25: 2 via RESPIRATORY_TRACT
  Filled 2011-11-25: qty 6.7

## 2011-11-25 NOTE — Progress Notes (Signed)
Patient claimed that he feels a whole lot better, has been off oxygen and breathing better.  Encouraged to used incentive spirometer and has been compliant with the staff.Report given to Ms. Leighton Roach, RN of unit 2000.  Patient is going to room 2036.  Patient is alert and oriented, follows command.

## 2011-11-25 NOTE — Progress Notes (Signed)
Family Medicine Teaching Service Attending Note  I interviewed and examined patient [name] and reviewed their tests and x-rays.  I discussed with Dr. Mikel Cella and reviewed their note for today.  I agree with their assessment and plan.     Additionally  Much improved Change to oral Would taper steroids slowly over several weeks

## 2011-11-25 NOTE — Progress Notes (Signed)
Patient ID: [name], male   DOB: [birth year], 56 y.o.   MRN: [id number] PGY-1 Daily Progress Note Family Medicine Teaching Service Mindie Rawdon M. Madalynne Gutmann, MD Service Pager: [phone]  Subjective: Patient states he is feeling much better this morning. Required O2 overnight, but has good O2 sats on room air this morning. Requesting more information about [facility] [place].   Objective: Vital signs in last 24 hours: Temp:  [97.9 F (36.6 C)-100 F (37.8 C)] 98.6 F (37 C) (01/04 0409) Pulse Rate:  [74-117] 83  (01/04 0409) Resp:  [13-32] 24  (01/04 0409) BP: (56-144)/(16-101) 144/91 mmHg (01/04 0409) SpO2:  [92 %-100 %] 97 % (01/04 0747) FiO2 (%):  [60 %] 60 % (01/03 0755) Weight:  [144 lb 6.4 oz (65.5 kg)] 144 lb 6.4 oz (65.5 kg) (01/04 0118) Weight change: 0 lb (0 kg) Last BM Date: 11/22/11  Intake/Output from previous day: 01/03 0701 - 01/04 0700 In: 800 [P.O.:440; I.V.:110; IV Piggyback:250] Out: 1250 [Urine:1250] Intake/Output this shift:    General appearance: alert, cooperative and no distress. Talkative Head: Normocephalic, without obvious abnormality, atraumatic Neck: no adenopathy and supple, symmetrical, trachea midline Resp: Improved air movement in all fields, but continues to have diffuse wheezing Cardio: regular rhythm, S1, S2 normal, no murmur, click, rub or gallop and tachycardia GI: soft, non-tender; bowel sounds normal; no masses,  no organomegaly Extremities: extremities normal, atraumatic, no cyanosis or edema Neurologic: Grossly normal  Lab Results:  Basename 11/25/11 0405 11/23/11 1958  WBC 6.8 14.9*  HGB 13.8 13.8  HCT 42.4 41.0  PLT 242 274   BMET  Basename 11/25/11 0405 11/24/11 0320  NA 139 141  K 3.8 3.9  CL 104 108  CO2 27 26  GLUCOSE 69* 94  BUN 16 11  CREATININE 1.06 1.05  CALCIUM 9.4 9.7    Studies/Results: Dg Chest 2 View  11/24/2011  *RADIOLOGY REPORT*  Clinical Data: Short of breath.  COPD.  CHEST - 2 VIEW  Comparison: 11/22/2011   Findings: Bilateral central peribronchial thickening remains stable.  No evidence of pulmonary infiltrate or pleural effusion. Heart size is normal.  No mass or lymphadenopathy identified.  IMPRESSION: Stable central peribronchial thickening.  No active disease.  Original Report Authenticated By: Danae Orleans, M.D.    Medications:  I have reviewed the patient's current medications. Scheduled:    . albuterol  2.5 mg Nebulization Q4H  . amLODipine  10 mg Oral Daily  . amLODipine  10 mg Oral To Minor  . aspirin  81 mg Oral Daily  . Fluticasone-Salmeterol  1 puff Inhalation BID  . guaiFENesin  600 mg Oral BID  . heparin  5,000 Units Subcutaneous Q8H  . moxifloxacin  400 mg Intravenous Q24H  . potassium chloride  40 mEq Oral Daily  . predniSONE  60 mg Oral Q breakfast  . sodium chloride  3 mL Intravenous Q12H   Continuous:  JYN:WGNFAO chloride, acetaminophen, acetaminophen, albuterol, HYDROcodone-acetaminophen, nicotine, promethazine, promethazine, sodium chloride  Assessment/Plan: 1. COPD Exacerbation.- Likely triggered by viral URI and smoking.  - On Avelox 400mg  IV. Will transition to PO. - Electrolyte correction, schedule potassium. (K 3.8 this morning.)  - Supplemental oxygen to keep SaO2 > 92%. Doing well on room air at this time. - Corticosteroids Prednisone 60 Mg PO q D. Will begin to taper this in next 24-48 hours. He will continue a slow taper as an outpatient. - Will switch Albuterol nebs to MDI today. - Continue home advair, step up dose at  D/C.  - Smoking cessation consult. Patient given information about [facility] [place] and free NRT. He states he will call.  2. Hypertension: BP stable this morning. Continue Norvasc 20 mg PO q D.   3. FEN/GI: heart healthy diet. Saline lock IVF  4. DVT PPx: Heparin [place]. Consider switching to Lovenox if patient prefers.  5. Dispo: Transfer to floor today and continue to monitor.  Discharge pending further clinical improvement. Patient will  likely need outpatient DEXA scan given the history of long term steroid use.   LOS: 2 days   Jamesetta Greenhalgh 11/25/2011, 7:51 AM

## 2011-11-25 NOTE — Progress Notes (Signed)
Patient's nurse from 2000 called me looking for patient's belonging.  Informed her that I put his long [name], a pair of sock and his bathroom in the patient's belonging bag.  I called Ms. [name], patient's wife to inquire if she took some of his belonging home.  I informed her that the only thing I've packed is his thermal pants, a pair of sock and his bathrobe.  She stated that she took some of his belonging home.    I called the patient in his room and informed him that Ms. [name] brought some of his belonging home.

## 2011-11-25 NOTE — Clinical Documentation Improvement (Signed)
RESPIRATORY FAILURE DOCUMENTATION CLARIFICATION QUERY   THIS DOCUMENT IS NOT A PERMANENT PART OF THE MEDICAL RECORD  TO RESPOND TO THE THIS QUERY, FOLLOW THE INSTRUCTIONS BELOW:  1. If needed, update documentation for the patient's encounter via the notes activity.  2. Access this query again and click edit on the In [employer].  3. After updating, or not, click F2 to complete all highlighted (required) fields concerning your review. Select "additional documentation in the medical record" OR "no additional documentation provided".  4. Click Sign note button.  5. The deficiency will fall out of your In Basket *Please let us know if you are not able to complete this workflow by phone or e-mail (listed below).  Please update your documentation within the medical record to reflect your response to this query.                                                                                     11/25/11  Dear Dr. [facility],  In a better effort to capture your patient's severity of illness, reflect appropriate length of stay and utilization of resources, a review of the patient medical record has revealed the following indicators.    Based on your clinical judgment, please clarify and document in a progress note and/or discharge summary the clinical condition associated with the following supporting information:  In responding to this query please exercise your independent judgment.  The fact that a query is asked, does not imply that any particular answer is desired or expected.  Possible Clinical Conditions (Can document resolved, resolving, etc. beside condition)   _______Acure Respiratory Failure  _______Acute Respiratory Distress  _______Acute on Chronic Respiratory Failure  _______Chronic Respiratory Failure  _______Other Condition  _______Cannot Clinically Determine    Supporting Information:  Risk Factors: Copd Exacerabation Current Tobacco  User "Two intubations in the past"  Signs&Symptoms: "Increased labored resp and resp distress"per RT notes "Shob"  Diagnostics: ABG's 11/24/2011 05:30 Delivery systems: BILEVEL POSITIVE AIRWAY PRESSURE FIO2: 0.40 pH, Arterial: 7.366 pCO2 arterial: 47.4 (H) pO2, Arterial: 39.8 (LL) Bicarbonate: 26.5 (H) TCO2: 27.9 Acid-Base Excess: 1.7 O2 Saturation: 75.0  Radiology: Chest 2 View (11-24-11) Findings: Bilateral central peribronchial thickening remains stable.  No evidence of pulmonary infiltrate or pleural effusion. Heart size is normal.  No mass or lymphadenopathy identified  Treatment: Was placed on Bipap 1-2 @ 1734 until 1-3--13@ 0756 Now on [place]@2 -3LPM Continous pulse oximetry monitoring in SDU  Respiratory Treatment: Albuterol HHN-Q4 Advair twice a day                    You may use possible, probable, or suspect with inpatient documentation. possible, probable, suspected diagnoses MUST be documented at the time of discharge  Reviewed: Query not anwsered  Thank You,    Rossie Muskrat RN, BSN Clinical Documentation Specialist Pager:  [phone] [email or web address]  Health Information Management [place]

## 2011-11-26 LAB — BASIC METABOLIC PANEL
BUN: 16 mg/dL (ref 6–23)
Creatinine, Ser: 1.2 mg/dL (ref 0.50–1.35)
GFR calc Af Amer: 76 mL/min — ABNORMAL LOW (ref >90)
GFR calc non Af Amer: 65 mL/min — ABNORMAL LOW (ref >90)
Glucose, Bld: 66 mg/dL — ABNORMAL LOW (ref 70–99)

## 2011-11-26 LAB — CBC
HCT: 41.5 % (ref 39.0–52.0)
Hemoglobin: 14.2 g/dL (ref 13.0–17.0)
MCH: 32.8 pg (ref 26.0–34.0)
MCHC: 34.2 g/dL (ref 30.0–36.0)
RDW: 12.6 % (ref 11.5–15.5)

## 2011-11-26 MED ORDER — GUAIFENESIN ER 600 MG PO TB12: 600.0000 mg | ORAL_TABLET | Freq: Two times a day (BID) | ORAL | Status: DC

## 2011-11-26 MED ORDER — LEVOFLOXACIN 750 MG PO TABS: 750.0000 mg | ORAL_TABLET | Freq: Every day | ORAL | Status: AC

## 2011-11-26 MED ORDER — PREDNISONE 10 MG PO TABS: ORAL_TABLET | ORAL | Status: DC

## 2011-11-26 MED ORDER — NICOTINE 7 MG/24HR TD PT24: 1.0000 | MEDICATED_PATCH | Freq: Every day | TRANSDERMAL | Status: DC | PRN

## 2011-11-26 NOTE — Discharge Summary (Signed)
Physician Discharge Summary  Patient ID: [name] MRN: [id number] DOB/AGE: [id number] 58 y.o.  Admit date: 11/23/2011 Discharge date: 11/26/2011  Admission Diagnoses: SOB, Cough  Discharge Diagnoses:  Principal Problem:  *COPD exacerbation Active Problems:  TOBACCO USER  HYPERTENSION, BENIGN SYSTEMIC  COPD   Discharged Condition: good  Hospital Course: Patient is a 56 YO M with pmh of COPD presenting with acute exacerbation. Previous history of 2 intubations due to COPD exacerbation. Admitted to stepdown unit and started on BiPAP due to poor respiratory status. (Poor O2 saturations, using accessory muscles.) Also started on Avelox at admission, and continued on DuoNeb treatments. By the following morning, patient had much improved. He was awake, off BiPAP and maintaining good O2 sats on nasal canula. He states he is ready to quit smoking, and he called 1-800-QUITNOW to set up an appointment next week for the full counseling session. Patient was transferred to the telemetry floor. He continued to improve, was tolerating PO, and doing well on MDI. Patient discharged home in stable medical condition. He will complete Levaquin 750mg  for 5 days, and well a long prednisone taper (50x5, 40x5, 20x7, 10x7). Would consider an outpatient DEXA scan given his long history of steroid use.   Consults: none  Significant Diagnostic Studies:  CBC    Component Value Date/Time   WBC 6.4 11/26/2011 0600   RBC 4.33 11/26/2011 0600   HGB 14.2 11/26/2011 0600   HCT 41.5 11/26/2011 0600   PLT 248 11/26/2011 0600   MCV 95.8 11/26/2011 0600   MCH 32.8 11/26/2011 0600   MCHC 34.2 11/26/2011 0600   RDW 12.6 11/26/2011 0600   LYMPHSABS 0.4* 07/28/2011 2331   MONOABS 0.0* 07/28/2011 2331   EOSABS 0.0 07/28/2011 2331   BASOSABS 0.0 07/28/2011 2331   BMET    Component Value Date/Time   NA 141 11/26/2011 0600   K 3.4* 11/26/2011 0600   CL 103 11/26/2011 0600   CO2 25 11/26/2011 0600   GLUCOSE 66* 11/26/2011 0600   BUN 16 11/26/2011  0600   CREATININE 1.20 11/26/2011 0600   CALCIUM 9.1 11/26/2011 0600   GFRNONAA 65* 11/26/2011 0600   GFRAA 76* 11/26/2011 0600   Dg Chest 2 View 11/24/2011  *RADIOLOGY REPORT*  Clinical Data: Short of breath.  COPD.  CHEST - 2 VIEW  Comparison: 11/22/2011  Findings: Bilateral central peribronchial thickening remains stable.  No evidence of pulmonary infiltrate or pleural effusion. Heart size is normal.  No mass or lymphadenopathy identified.  IMPRESSION: Stable central peribronchial thickening.  No active disease.  Original Report Authenticated By: Danae Orleans, M.D.   Dg Chest 2 View 11/22/2011  *RADIOLOGY REPORT*  Clinical Data: Cough.  Shortness of breath.  Smoker with history of COPD.  CHEST - 2 VIEW 11/22/2011:  Comparison: Two-view chest x-ray 06/27/2011, 09/26/2010, 12/22/2008 [facility].  Findings: Cardiomediastinal silhouette unremarkable and unchanged. Lungs hyperinflated but clear.  No pleural effusions.  Visualized bony thorax intact.  IMPRESSION: Stable hyperinflation consistent with COPD and/or asthma.  No acute cardiopulmonary disease.  Original Report Authenticated By: Arnell Sieving, M.D.    Treatments: IV hydration, antibiotics: Avelox, steroids: prednisone and respiratory therapy: O2/BiPAP  Discharge Exam: Blood pressure 135/95, pulse 81, temperature 98 F (36.7 C), temperature source Oral, resp. rate 18, height 5\' 9"  (1.753 m), weight 144 lb 6.4 oz (65.5 kg), SpO2 98.00%. See daily Progress Note  Disposition: Home or Self Care  Discharge Orders    Future Orders Please Complete By Expires  Diet - low sodium heart healthy      Increase activity slowly      Call MD for:  temperature >100.4      Call MD for:  severe uncontrolled pain      Call MD for:  difficulty breathing, headache or visual disturbances      Call MD for:  hives      Call MD for:  extreme fatigue        Medication List  As of 11/26/2011 10:53 AM   START taking these medications          levofloxacin 750 MG tablet   Commonly known as: LEVAQUIN   Take 1 tablet (750 mg total) by mouth daily.         CHANGE how you take these medications         predniSONE 10 MG tablet   Commonly known as: DELTASONE   Take 5 tabs per day for 5 days, then 4 tabs per day for 5 days, then 2 tabs per day for one week, then one tab per day for one week.   What changed: - medication strength - dose - route (how to take the med) - how often to take the med - doctor's instructions         CONTINUE taking these medications         ADVAIR DISKUS 500-50 MCG/DOSE Aepb   Generic drug: Fluticasone-Salmeterol      * albuterol (2.5 MG/3ML) 0.083% nebulizer solution   Commonly known as: PROVENTIL      * albuterol 108 (90 BASE) MCG/ACT inhaler   Commonly known as: PROVENTIL HFA;VENTOLIN HFA      amLODipine 10 MG tablet   Commonly known as: NORVASC      aspirin 81 MG EC tablet      fexofenadine 180 MG tablet   Commonly known as: ALLEGRA     * Notice: This list has 2 medication(s) that are the same as other medications prescribed for you. Read the directions carefully, and ask your doctor or other care provider to review them with you.       STOP taking these medications         azithromycin 250 MG tablet          Where to get your medications    These are the prescriptions that you need to pick up. We sent them to a specific pharmacy, so you will need to go there to get them.   [id number] [place], [place] - [address]    [address] [place] [address]    Phone: [phone]        levofloxacin 750 MG tablet   predniSONE 10 MG tablet           Follow-up Information    Follow up with CAVINESS,DAWN. Make an appointment in 1 week.   Contact information:   [facility] [phone]         Signed: Rodman Pickle 11/26/2011, 10:53 AM

## 2011-11-26 NOTE — Discharge Summary (Signed)
FMTS attending note  I have seen Mr. Elenes today and discussed plan with resident Dr. Mikel Cella.  See additional note for details.  I agree with plan for discharge today.  Paula Compton, MD

## 2011-11-26 NOTE — Progress Notes (Signed)
FMTS Attending Note  Patient seen and examined by me, discussed with Dr. Mikel Cella.  [name] is feeling well, not requiring supplemental O2, denies any complaints and asks for discharge.   Well appearing, breathing comfortably and speaking in fluid sentences.  No apparent distress Neck supple.  PULM Prolonged expir phase and general coarse breath sounds without focal rales COR S1S2  A/P: Patient admitted with COPD exacerbation likely triggered by URI; history of cigarette smoking, tells me he is now an occasional smoker (1-2 cigarettes sporadically not daily).  Agree with discharge plan as per Dr. Mikel Cella.  Patient received flu shot in September.  Paula Compton, MD

## 2011-11-26 NOTE — Progress Notes (Signed)
11/26/2011 12:24 PM Nursing Note: Discharge AVS, called in RX explained to patient as well as those medications already take today and those due this evening. When to call md reviewed. D/c iv d/c tele. D/c home with wife per orders. Questions and concerns addressed.  Taralyn Ferraiolo, [place]

## 2011-12-07 ENCOUNTER — Ambulatory Visit (INDEPENDENT_AMBULATORY_CARE_PROVIDER_SITE_OTHER): Payer: Medicare Other | Admitting: Family Medicine

## 2011-12-07 DIAGNOSIS — F17200 Nicotine dependence, unspecified, uncomplicated: Secondary | ICD-10-CM

## 2011-12-07 DIAGNOSIS — R599 Enlarged lymph nodes, unspecified: Secondary | ICD-10-CM

## 2011-12-07 DIAGNOSIS — J449 Chronic obstructive pulmonary disease, unspecified: Secondary | ICD-10-CM

## 2011-12-07 MED ORDER — FLUTICASONE-SALMETEROL 500-50 MCG/DOSE IN AEPB: 1.0000 | INHALATION_SPRAY | Freq: Two times a day (BID) | RESPIRATORY_TRACT | Status: DC

## 2011-12-07 NOTE — Patient Instructions (Addendum)
Smoking Cessation: GREAT JOB!!!  Keep up the great work!  COPD: Continue using inhalers like we have discussed.  Swollen lymph node: Watch closely over the next 3 weeks.  It should continue to improve and slowly decrease in size.  Most likely inflammed due to recent virus.  Return in 2-3 months for routine physical.

## 2011-12-10 DIAGNOSIS — R599 Enlarged lymph nodes, unspecified: Secondary | ICD-10-CM | POA: Insufficient documentation

## 2011-12-10 NOTE — Assessment & Plan Note (Signed)
Congratulated pt on being smoke free x 16 days.  Talked about resources and help that can give him support as he struggles to stay smoke free.

## 2011-12-10 NOTE — Assessment & Plan Note (Signed)
Discussed with pt the importance of using the advair as directed bid and buying the correct dosage.  Also reminded about albuterol being a rescue inhaler for wheezing and sob.  Not to be used as a scheduled medication.  Pt states understanding.  Lungs completely clear on exam.

## 2011-12-10 NOTE — Assessment & Plan Note (Signed)
Most likely 2/2 recent viral syndrome.  No intervention at this time.  Watchful waiting only.

## 2011-12-10 NOTE — Progress Notes (Signed)
  Subjective:    Patient ID: [name], male    DOB: December 09, 1955, 56 y.o.   MRN: [id number]  HPI Hospital f/up--COPD exacerbation: States that he is doing well.  Typically uses albuterol inhaler bid. He hasn't had any wheezing but he uses the albuterol b/c it helps him feel like he can breath easier and take bigger breaths. Not using advair- has not yet picked this up from the pharmacy.  Had some advair at home that is a lower dosage that he has used occasionally since discharge from hospital. No wheezing. No sob.  No lower ext swelling.  No cough. No runny nose or viral symptoms currently.    Lymph node swollen: had a scratchy sensation in throat after he finished walking/exercising a few days ago. The sensation went away after 1 day.  But during that time he noticed a swollen lymph node in right neck area.  This has stayed, but it has decreased in size.  No tenderness to palpation.  Did have viral syndrome which led to COPD exacerbation approx 2-3 weeks ago.  Unsure if the lymph node was swollen at that time.  No tenderness. No skin lesions on scalp.  No throat pain currently.  No cold symptoms currently.    Smoking cessation: Tobacco free x 16 days.  Stopped cold [place] during hospitalization.  Pt states that he has been intubated x 2 for breathing problems and this time he was in the hospital it really scared him.  He states he is really motivated to quit this time.       Review of Systems As per above.     Objective:   Physical Exam  Constitutional: He appears well-developed and well-nourished.  Eyes: Right eye exhibits no discharge. Left eye exhibits no discharge.  Neck: Neck supple. No thyromegaly present.  Cardiovascular: Normal rate, regular rhythm and normal heart sounds.   No murmur heard. Pulmonary/Chest: Effort normal and breath sounds normal. No respiratory distress. He has no wheezes. He has no rales. He exhibits no tenderness.  Abdominal: Soft. He exhibits no  distension.  Musculoskeletal: He exhibits no edema.  Lymphadenopathy:    He has cervical adenopathy (right submandibular swollen lymph node approx size of quarter.  no tenderness on exam.  not fixed.  no other andenopathy. ).  Neurological: He is alert.  Skin: No rash noted.  Psychiatric: His behavior is normal.          Assessment & Plan:

## 2011-12-16 ENCOUNTER — Other Ambulatory Visit: Payer: Self-pay | Admitting: Family Medicine

## 2011-12-17 NOTE — Telephone Encounter (Signed)
Refill request

## 2011-12-22 ENCOUNTER — Encounter (HOSPITAL_COMMUNITY): Payer: Self-pay | Admitting: Emergency Medicine

## 2011-12-22 ENCOUNTER — Emergency Department (INDEPENDENT_AMBULATORY_CARE_PROVIDER_SITE_OTHER)
Admission: EM | Admit: 2011-12-22 | Discharge: 2011-12-22 | Disposition: A | Discharge status: Home / Self Care | Payer: Medicare Other | Source: Home / Self Care | Attending: Emergency Medicine | Admitting: Emergency Medicine

## 2011-12-22 DIAGNOSIS — K0889 Other specified disorders of teeth and supporting structures: Secondary | ICD-10-CM

## 2011-12-22 DIAGNOSIS — K089 Disorder of teeth and supporting structures, unspecified: Secondary | ICD-10-CM

## 2011-12-22 MED ORDER — PENICILLIN V POTASSIUM 500 MG PO TABS: 500.0000 mg | ORAL_TABLET | Freq: Four times a day (QID) | ORAL | Status: AC

## 2011-12-22 MED ORDER — MELOXICAM 15 MG PO TABS: 15.0000 mg | ORAL_TABLET | Freq: Every day | ORAL | Status: DC

## 2011-12-22 MED ORDER — LIDOCAINE VISCOUS 2 % MT SOLN: 10.0000 mL | OROMUCOSAL | Status: AC | PRN

## 2011-12-22 MED ORDER — HYDROCODONE-ACETAMINOPHEN 5-325 MG PO TABS: ORAL_TABLET | ORAL | Status: AC

## 2011-12-22 MED ORDER — CHLORHEXIDINE GLUCONATE 0.12 % MT SOLN: OROMUCOSAL | Status: AC

## 2011-12-22 NOTE — ED Provider Notes (Signed)
History     CSN: [id number]  Arrival date & time 12/22/11  1533   First MD Initiated Contact with Patient 12/22/11 1640      Chief Complaint  Patient presents with  . Dental Pain    (Consider location/radiation/quality/duration/timing/severity/associated sxs/prior treatment) HPI Comments: Patient states he bit down on something last night, and cracked a left lower tooth. States he spat out a portion of his tooth. Now complains of localized, dull, throbbing dental pain. No facial swelling. No swelling in the tongue, fevers, neck pain, difficulty swallowing. Tried aspirin last night with some relief, however, it did not help today.   ROS as noted in HPI. All other ROS negative.   Patient is a 56 y.o. male presenting with tooth pain. The history is provided by the patient. No language interpreter was used.  Dental PainThe primary symptoms include mouth pain and dental injury. Primary symptoms do not include oral bleeding, oral lesions, fever or sore throat. The symptoms began yesterday. The symptoms are worsening. The symptoms occur constantly.  Additional symptoms include: dental sensitivity to temperature and gum tenderness. Additional symptoms do not include: gum swelling, purulent gums, trismus, facial swelling, trouble swallowing and pain with swallowing.    Past Medical History  Diagnosis Date  . COPD (chronic obstructive pulmonary disease)   . Hypertension   . Asthma   . Shortness of breath     Past Surgical History  Procedure Date  . Colonoscopy     Family History  Problem Relation Age of Onset  . Cancer Father     History  Substance Use Topics  . Smoking status: Former Smoker -- 0.2 packs/day for 30 years    Types: Cigarettes    Quit date: 11/23/2011  . Smokeless tobacco: Never Used  . Alcohol Use: No     rare      Review of Systems  Constitutional: Negative for fever.  HENT: Negative for sore throat, facial swelling and trouble swallowing.      Allergies  Spiriva handihaler  Home Medications   Current Outpatient Rx  Name Route Sig Dispense Refill  . ASPIRIN 500 MG PO TBEC Oral Take 500 mg by mouth every 6 (six) hours as needed.    . ALBUTEROL SULFATE (2.5 MG/3ML) 0.083% IN NEBU Nebulization Take 2.5 mg by nebulization 4 (four) times daily as needed. For shortness of breath     . AMLODIPINE BESYLATE 10 MG PO TABS  TAKE 1 TABLET EVERY DAY FOR BLOOD PRESSURE 34 tablet 11  . ASPIRIN 81 MG PO TBEC Oral Take 81 mg by mouth daily.      Marland Kitchen FEXOFENADINE HCL 180 MG PO TABS Oral Take 180 mg by mouth daily.     Marland Kitchen FLUTICASONE-SALMETEROL 500-50 MCG/DOSE IN AEPB Inhalation Inhale 1 puff into the lungs 2 (two) times daily. 60 each 11  . PROVENTIL HFA 108 (90 BASE) MCG/ACT IN AERS  TAKE 2 PUFFS USING INHALER EVERY 4 HOURS AS NEEDED FOR WHEEZING 1 Inhaler 6    BP 145/89  Pulse 90  Temp(Src) 98 F (36.7 C) (Oral)  Resp 18  SpO2 98%  Physical Exam  Nursing note and vitals reviewed. Constitutional: He is oriented to person, place, and time. He appears well-developed and well-nourished.  HENT:  Head: Normocephalic and atraumatic.  Mouth/Throat: Dental caries present.         Poor dentition throughout. No gingival swelling, purulent drainage from gums. No swelling underneath the tongue.  Eyes: Conjunctivae and EOM are normal.  Neck: Normal range of motion.  Cardiovascular: Normal rate.   Pulmonary/Chest: Effort normal.  Abdominal: He exhibits no distension.  Musculoskeletal: Normal range of motion.  Lymphadenopathy:    He has no cervical adenopathy.  Neurological: He is alert and oriented to person, place, and time.  Skin: Skin is warm and dry.  Psychiatric: He has a normal mood and affect. His behavior is normal.    ED Course  Procedures (including critical care time)  Labs Reviewed - No data to display No results found.   1. Pain, dental       MDM  Advise patient to call Dr. Russella Dar, dentist on call, within the  next 48 hours to have this repaired. Sending home with prescription for penicillin, however, advise patient to ask Dr. Russella Dar whether he wants to have him start it or not, as there is no signs of infection at this time. Patient agrees with plan.  Luiz Blare, MD 12/22/11 [phone]

## 2011-12-22 NOTE — ED Notes (Signed)
HERE WITH LEFT BOTTOM TOOTH PAIN  AFTER TOOTH BROKE YESTERDAY WHILE EATING.THROB PAIN STARTED LAST NIGHT.PT HAS MEDICAID BUT UNABLE TO PAY COPAY.

## 2012-06-09 ENCOUNTER — Emergency Department (HOSPITAL_COMMUNITY)
Admission: EM | Admit: 2012-06-09 | Discharge: 2012-06-09 | Disposition: A | Discharge status: Home / Self Care | Payer: Medicare Other | Attending: Emergency Medicine | Admitting: Emergency Medicine

## 2012-06-09 ENCOUNTER — Encounter (HOSPITAL_COMMUNITY): Payer: Self-pay | Admitting: Emergency Medicine

## 2012-06-09 DIAGNOSIS — Z87891 Personal history of nicotine dependence: Secondary | ICD-10-CM | POA: Insufficient documentation

## 2012-06-09 DIAGNOSIS — I10 Essential (primary) hypertension: Secondary | ICD-10-CM | POA: Insufficient documentation

## 2012-06-09 DIAGNOSIS — J4489 Other specified chronic obstructive pulmonary disease: Secondary | ICD-10-CM | POA: Insufficient documentation

## 2012-06-09 DIAGNOSIS — K047 Periapical abscess without sinus: Secondary | ICD-10-CM | POA: Insufficient documentation

## 2012-06-09 DIAGNOSIS — K029 Dental caries, unspecified: Secondary | ICD-10-CM | POA: Insufficient documentation

## 2012-06-09 DIAGNOSIS — K0889 Other specified disorders of teeth and supporting structures: Secondary | ICD-10-CM

## 2012-06-09 DIAGNOSIS — J449 Chronic obstructive pulmonary disease, unspecified: Secondary | ICD-10-CM | POA: Insufficient documentation

## 2012-06-09 MED ORDER — PENICILLIN V POTASSIUM 500 MG PO TABS: 500.0000 mg | ORAL_TABLET | Freq: Three times a day (TID) | ORAL | Status: DC

## 2012-06-09 MED ORDER — HYDROCODONE-ACETAMINOPHEN 5-325 MG PO TABS: 2.0000 | ORAL_TABLET | ORAL | Status: DC | PRN

## 2012-06-09 NOTE — ED Notes (Signed)
Patient complaining of an infected tooth on the upper left side; patient reports mild facial swelling.  Patient states that he is currently trying to get in with a dentist to get tooth pulled out.

## 2012-06-09 NOTE — ED Provider Notes (Signed)
History     CSN: [id number]  Arrival date & time 06/09/12  2141   First MD Initiated Contact with Patient 06/09/12 2217      Chief Complaint  Patient presents with  . Dental Pain   HPI  History provided by the patient. Patient is a 56 year old male with history of hypertension, COPD who presents with complaints of left upper mouth pain and facial swelling. Patient reports having significant problems with his dentition and admits to poor dental hygiene. He reports having off-and-on swelling to the gums and pain to his teeth. Patient reports having some increasing pain and swelling to his left upper gums and face. Patient states he has had a decayed tooth in that area is now covered entirely with the gums. He denies any bleeding or purulent drainage. Denies any fever, chills, sweats. No swelling of the throat or difficulty breathing.    Past Medical History  Diagnosis Date  . COPD (chronic obstructive pulmonary disease)   . Hypertension   . Asthma   . Shortness of breath     Past Surgical History  Procedure Date  . Colonoscopy     Family History  Problem Relation Age of Onset  . Cancer Father     History  Substance Use Topics  . Smoking status: Former Smoker -- 0.2 packs/day for 30 years    Types: Cigarettes    Quit date: 11/23/2011  . Smokeless tobacco: Never Used  . Alcohol Use: No     rare      Review of Systems  Constitutional: Negative for fever and chills.  HENT: Positive for dental problem. Negative for sore throat and trouble swallowing.   Respiratory: Negative for shortness of breath.   Gastrointestinal: Negative for nausea and vomiting.    Allergies  Tiotropium bromide monohydrate  Home Medications   Current Outpatient Rx  Name Route Sig Dispense Refill  . ALBUTEROL SULFATE HFA 108 (90 BASE) MCG/ACT IN AERS Inhalation Inhale 2 puffs into the lungs every 6 (six) hours as needed. For shortness of breath or wheezing    . ALBUTEROL SULFATE (2.5  MG/3ML) 0.083% IN NEBU Nebulization Take 2.5 mg by nebulization 4 (four) times daily as needed. For shortness of breath     . AMLODIPINE BESYLATE 10 MG PO TABS Oral Take 10 mg by mouth daily.    . ASPIRIN 81 MG PO TBEC Oral Take 81 mg by mouth daily.      Marland Kitchen FEXOFENADINE HCL 180 MG PO TABS Oral Take 180 mg by mouth daily.     Marland Kitchen FLUTICASONE-SALMETEROL 500-50 MCG/DOSE IN AEPB Inhalation Inhale 1 puff into the lungs 2 (two) times daily.    . MELOXICAM 15 MG PO TABS Oral Take 15 mg by mouth daily.      BP 150/89  Pulse 101  Temp 98.3 F (36.8 C) (Oral)  Resp 18  SpO2 97%  Physical Exam  Nursing note and vitals reviewed. Constitutional: He appears well-developed and well-nourished.  HENT:  Head: Normocephalic.       Widespread dental decay. Poor dentition throughout. There is mild loss of left nasolabial fold and swelling to the face. Some swelling to the gums without fluctuance or drainable abscess.  Neck: Normal range of motion. Neck supple.  Cardiovascular: Normal rate and regular rhythm.   Pulmonary/Chest: Effort normal and breath sounds normal.  Lymphadenopathy:    He has no cervical adenopathy.  Neurological: He is alert.  Skin: Skin is warm.  Psychiatric: He has a normal  mood and affect. His behavior is normal.    ED Course  Procedures    1. Periapical abscess   2. Pain, dental       MDM  10:30 PM patient seen and evaluated. No drainable abscess present. Will provide prescription for antibiotics and pain medications. Dental referral given.        Angus Seller, [place] 06/11/12 [phone]

## 2012-06-11 NOTE — ED Provider Notes (Signed)
Medical screening examination/treatment/procedure(s) were performed by non-physician practitioner and as supervising physician I was immediately available for consultation/collaboration.   Loren Racer, MD 06/11/12 [phone]

## 2012-06-14 ENCOUNTER — Encounter (HOSPITAL_COMMUNITY): Payer: Self-pay | Admitting: *Deleted

## 2012-06-14 DIAGNOSIS — L03211 Cellulitis of face: Principal | ICD-10-CM | POA: Diagnosis present

## 2012-06-14 DIAGNOSIS — F17200 Nicotine dependence, unspecified, uncomplicated: Secondary | ICD-10-CM | POA: Diagnosis present

## 2012-06-14 DIAGNOSIS — L0201 Cutaneous abscess of face: Principal | ICD-10-CM | POA: Diagnosis present

## 2012-06-14 DIAGNOSIS — J449 Chronic obstructive pulmonary disease, unspecified: Secondary | ICD-10-CM | POA: Diagnosis present

## 2012-06-14 DIAGNOSIS — J4489 Other specified chronic obstructive pulmonary disease: Secondary | ICD-10-CM | POA: Diagnosis present

## 2012-06-14 NOTE — ED Notes (Signed)
PT was seen here on Thursday for tx of an abscess r/t a L upper molar.  Pt with obvious swelling and redness to L max sinus.  Pt states he has been taking his antibiotcs and pain medications and the swelling was reducing.  However, the pain and swelling are increasing again.

## 2012-06-15 ENCOUNTER — Inpatient Hospital Stay (HOSPITAL_COMMUNITY)
Admission: EM | Admit: 2012-06-15 | Discharge: 2012-06-16 | DRG: 603 | Disposition: A | Discharge status: Home / Self Care | Payer: Medicare Other | Attending: Family Medicine | Admitting: Family Medicine

## 2012-06-15 ENCOUNTER — Encounter (HOSPITAL_COMMUNITY): Payer: Self-pay | Admitting: Family Medicine

## 2012-06-15 ENCOUNTER — Encounter (HOSPITAL_COMMUNITY): Payer: Self-pay | Admitting: Certified Registered"

## 2012-06-15 ENCOUNTER — Encounter (HOSPITAL_COMMUNITY)
Admission: EM | Disposition: A | Discharge status: Home / Self Care | Payer: Self-pay | Source: Home / Self Care | Attending: Family Medicine

## 2012-06-15 ENCOUNTER — Observation Stay (HOSPITAL_COMMUNITY): Payer: Medicare Other | Admitting: Certified Registered"

## 2012-06-15 ENCOUNTER — Observation Stay (HOSPITAL_COMMUNITY): Payer: Medicare Other

## 2012-06-15 DIAGNOSIS — I10 Essential (primary) hypertension: Secondary | ICD-10-CM | POA: Diagnosis present

## 2012-06-15 DIAGNOSIS — J449 Chronic obstructive pulmonary disease, unspecified: Secondary | ICD-10-CM | POA: Diagnosis present

## 2012-06-15 DIAGNOSIS — L0201 Cutaneous abscess of face: Secondary | ICD-10-CM

## 2012-06-15 DIAGNOSIS — F17200 Nicotine dependence, unspecified, uncomplicated: Secondary | ICD-10-CM | POA: Diagnosis present

## 2012-06-15 DIAGNOSIS — L03211 Cellulitis of face: Secondary | ICD-10-CM

## 2012-06-15 LAB — CBC
HCT: 36.1 % — ABNORMAL LOW (ref 39.0–52.0)
Hemoglobin: 12.4 g/dL — ABNORMAL LOW (ref 13.0–17.0)
WBC: 11.6 10*3/uL — ABNORMAL HIGH (ref 4.0–10.5)

## 2012-06-15 LAB — POCT I-STAT, CHEM 8
HCT: 39 % (ref 39.0–52.0)
Hemoglobin: 13.3 g/dL (ref 13.0–17.0)
Potassium: 3.9 mEq/L (ref 3.5–5.1)
Sodium: 145 mEq/L (ref 135–145)
TCO2: 22 mmol/L (ref 0–100)

## 2012-06-15 LAB — BASIC METABOLIC PANEL
Chloride: 109 mEq/L (ref 96–112)
GFR calc Af Amer: >90 mL/min (ref >90)
Potassium: 3.4 mEq/L — ABNORMAL LOW (ref 3.5–5.1)
Sodium: 142 mEq/L (ref 135–145)

## 2012-06-15 LAB — CBC WITH DIFFERENTIAL/PLATELET
Basophils Absolute: 0 10*3/uL (ref 0.0–0.1)
Basophils Relative: 0 % (ref 0–1)
Eosinophils Absolute: 0.4 10*3/uL (ref 0.0–0.7)
Eosinophils Relative: 4 % (ref 0–5)
Lymphs Abs: 2.9 10*3/uL (ref 0.7–4.0)
MCH: 31.4 pg (ref 26.0–34.0)
MCV: 92.3 fL (ref 78.0–100.0)
Neutrophils Relative %: 60 % (ref 43–77)
Platelets: 326 10*3/uL (ref 150–400)
RBC: 4.17 MIL/uL — ABNORMAL LOW (ref 4.22–5.81)
RDW: 12.7 % (ref 11.5–15.5)
WBC: 10.7 10*3/uL — ABNORMAL HIGH (ref 4.0–10.5)

## 2012-06-15 LAB — SURGICAL PCR SCREEN: Staphylococcus aureus: NEGATIVE

## 2012-06-15 SURGERY — IRRIGATION AND DEBRIDEMENT ABSCESS

## 2012-06-15 SURGERY — INCISION AND DRAINAGE, ABSCESS
Anesthesia: General | Site: Face | Wound class: Dirty or Infected

## 2012-06-15 MED ORDER — SODIUM CHLORIDE 0.9 % IV SOLN: INTRAVENOUS | Status: DC

## 2012-06-15 MED ORDER — ALBUTEROL SULFATE (5 MG/ML) 0.5% IN NEBU
INHALATION_SOLUTION | RESPIRATORY_TRACT | Status: AC
  Filled 2012-06-15: qty 0.5

## 2012-06-15 MED ORDER — BACID PO TABS
2.0000 | ORAL_TABLET | Freq: Three times a day (TID) | ORAL | Status: DC
  Filled 2012-06-15 (×3): qty 2

## 2012-06-15 MED ORDER — SODIUM CHLORIDE 0.9 % IV BOLUS (SEPSIS)
500.0000 mL | Freq: Once | INTRAVENOUS | Status: AC
  Administered 2012-06-15: 500 mL via INTRAVENOUS

## 2012-06-15 MED ORDER — METRONIDAZOLE 500 MG PO TABS
500.0000 mg | ORAL_TABLET | Freq: Three times a day (TID) | ORAL | Status: DC
  Administered 2012-06-15 – 2012-06-16 (×3): 500 mg via ORAL
  Filled 2012-06-15 (×6): qty 1

## 2012-06-15 MED ORDER — LIDOCAINE-EPINEPHRINE 1 %-1:100000 IJ SOLN
INTRAMUSCULAR | Status: AC
  Filled 2012-06-15: qty 1

## 2012-06-15 MED ORDER — PROPOFOL 10 MG/ML IV EMUL
INTRAVENOUS | Status: DC | PRN
  Administered 2012-06-15: 200 mg via INTRAVENOUS

## 2012-06-15 MED ORDER — POLYETHYLENE GLYCOL 3350 17 G PO PACK: 17.0000 g | PACK | Freq: Every day | ORAL | Status: DC | PRN

## 2012-06-15 MED ORDER — SODIUM CHLORIDE 0.9 % IV SOLN
INTRAVENOUS | Status: DC
  Administered 2012-06-15 (×3): via INTRAVENOUS

## 2012-06-15 MED ORDER — LORATADINE 10 MG PO TABS
10.0000 mg | ORAL_TABLET | Freq: Every day | ORAL | Status: DC
  Administered 2012-06-15 – 2012-06-16 (×2): 10 mg via ORAL
  Filled 2012-06-15 (×2): qty 1

## 2012-06-15 MED ORDER — MIDAZOLAM HCL 5 MG/5ML IJ SOLN
INTRAMUSCULAR | Status: DC | PRN
  Administered 2012-06-15: 2 mg via INTRAVENOUS

## 2012-06-15 MED ORDER — ALBUTEROL SULFATE (5 MG/ML) 0.5% IN NEBU
2.5000 mg | INHALATION_SOLUTION | RESPIRATORY_TRACT | Status: DC
  Administered 2012-06-15 – 2012-06-16 (×7): 2.5 mg via RESPIRATORY_TRACT
  Filled 2012-06-15 (×8): qty 0.5

## 2012-06-15 MED ORDER — MORPHINE SULFATE 2 MG/ML IJ SOLN: 1.0000 mg | INTRAMUSCULAR | Status: DC | PRN

## 2012-06-15 MED ORDER — RISAQUAD PO CAPS
2.0000 | ORAL_CAPSULE | Freq: Three times a day (TID) | ORAL | Status: DC
  Administered 2012-06-16 (×2): 2 via ORAL
  Filled 2012-06-15 (×4): qty 2

## 2012-06-15 MED ORDER — HYDROMORPHONE HCL PF 1 MG/ML IJ SOLN
INTRAMUSCULAR | Status: AC
  Filled 2012-06-15: qty 1

## 2012-06-15 MED ORDER — ASPIRIN 81 MG PO TBEC: 81.0000 mg | DELAYED_RELEASE_TABLET | Freq: Every day | ORAL | Status: DC

## 2012-06-15 MED ORDER — PENICILLIN V POTASSIUM 500 MG PO TABS
500.0000 mg | ORAL_TABLET | Freq: Three times a day (TID) | ORAL | Status: DC
  Filled 2012-06-15 (×3): qty 1

## 2012-06-15 MED ORDER — AMLODIPINE BESYLATE 10 MG PO TABS
10.0000 mg | ORAL_TABLET | Freq: Every day | ORAL | Status: DC
  Administered 2012-06-15 – 2012-06-16 (×2): 10 mg via ORAL
  Filled 2012-06-15 (×2): qty 1

## 2012-06-15 MED ORDER — SODIUM CHLORIDE 0.9 % IR SOLN
Status: DC | PRN
  Administered 2012-06-15: 09:00:00

## 2012-06-15 MED ORDER — CHLORHEXIDINE GLUCONATE 0.12 % MT SOLN
15.0000 mL | Freq: Two times a day (BID) | OROMUCOSAL | Status: DC
  Administered 2012-06-15 – 2012-06-16 (×2): 15 mL via OROMUCOSAL
  Filled 2012-06-15 (×5): qty 15

## 2012-06-15 MED ORDER — ASPIRIN EC 81 MG PO TBEC
81.0000 mg | DELAYED_RELEASE_TABLET | Freq: Every day | ORAL | Status: DC
  Administered 2012-06-15 – 2012-06-16 (×2): 81 mg via ORAL
  Filled 2012-06-15 (×2): qty 1

## 2012-06-15 MED ORDER — ALBUTEROL SULFATE HFA 108 (90 BASE) MCG/ACT IN AERS
INHALATION_SPRAY | RESPIRATORY_TRACT | Status: DC | PRN
  Administered 2012-06-15 (×2): 2 via RESPIRATORY_TRACT

## 2012-06-15 MED ORDER — VANCOMYCIN HCL IN DEXTROSE 1-5 GM/200ML-% IV SOLN
1000.0000 mg | Freq: Once | INTRAVENOUS | Status: AC
  Administered 2012-06-15: 1000 mg via INTRAVENOUS
  Filled 2012-06-15: qty 200

## 2012-06-15 MED ORDER — SUFENTANIL CITRATE 50 MCG/ML IV SOLN
INTRAVENOUS | Status: DC | PRN
  Administered 2012-06-15: 20 ug via INTRAVENOUS

## 2012-06-15 MED ORDER — FLUTICASONE-SALMETEROL 500-50 MCG/DOSE IN AEPB
1.0000 | INHALATION_SPRAY | Freq: Two times a day (BID) | RESPIRATORY_TRACT | Status: DC
  Administered 2012-06-16: 1 via RESPIRATORY_TRACT

## 2012-06-15 MED ORDER — HYDROMORPHONE HCL PF 1 MG/ML IJ SOLN
0.2500 mg | INTRAMUSCULAR | Status: DC | PRN
  Administered 2012-06-15: 0.5 mg via INTRAVENOUS

## 2012-06-15 MED ORDER — DOXYCYCLINE HYCLATE 100 MG IV SOLR
100.0000 mg | Freq: Two times a day (BID) | INTRAVENOUS | Status: DC
  Administered 2012-06-15 – 2012-06-16 (×3): 100 mg via INTRAVENOUS
  Filled 2012-06-15 (×4): qty 100

## 2012-06-15 MED ORDER — CLINDAMYCIN PHOSPHATE 300 MG/50ML IV SOLN
300.0000 mg | Freq: Three times a day (TID) | INTRAVENOUS | Status: DC
  Administered 2012-06-15 – 2012-06-16 (×3): 300 mg via INTRAVENOUS
  Filled 2012-06-15 (×6): qty 50

## 2012-06-15 MED ORDER — ONDANSETRON HCL 4 MG PO TABS: 4.0000 mg | ORAL_TABLET | Freq: Four times a day (QID) | ORAL | Status: DC | PRN

## 2012-06-15 MED ORDER — ONDANSETRON HCL 4 MG/2ML IJ SOLN: 4.0000 mg | Freq: Four times a day (QID) | INTRAMUSCULAR | Status: DC | PRN

## 2012-06-15 MED ORDER — FLUTICASONE-SALMETEROL 500-50 MCG/DOSE IN AEPB
1.0000 | INHALATION_SPRAY | Freq: Two times a day (BID) | RESPIRATORY_TRACT | Status: DC
  Administered 2012-06-15: 1 via RESPIRATORY_TRACT
  Filled 2012-06-15: qty 14

## 2012-06-15 MED ORDER — HYDROMORPHONE HCL PF 1 MG/ML IJ SOLN
1.0000 mg | Freq: Once | INTRAMUSCULAR | Status: AC
  Administered 2012-06-15: 1 mg via INTRAVENOUS
  Filled 2012-06-15: qty 1

## 2012-06-15 MED ORDER — DEXAMETHASONE SODIUM PHOSPHATE 10 MG/ML IJ SOLN
10.0000 mg | Freq: Three times a day (TID) | INTRAMUSCULAR | Status: AC
  Administered 2012-06-15 – 2012-06-16 (×3): 10 mg via INTRAVENOUS
  Filled 2012-06-15 (×3): qty 1

## 2012-06-15 MED ORDER — HYDROCODONE-ACETAMINOPHEN 5-325 MG PO TABS
2.0000 | ORAL_TABLET | ORAL | Status: DC | PRN
  Administered 2012-06-15: 2 via ORAL
  Filled 2012-06-15: qty 2

## 2012-06-15 MED ORDER — IOHEXOL 300 MG/ML  SOLN
80.0000 mL | Freq: Once | INTRAMUSCULAR | Status: AC | PRN
  Administered 2012-06-15: 80 mL via INTRAVENOUS

## 2012-06-15 MED ORDER — 0.9 % SODIUM CHLORIDE (POUR BTL) OPTIME
TOPICAL | Status: DC | PRN
  Administered 2012-06-15: 1000 mL

## 2012-06-15 MED ORDER — SUCCINYLCHOLINE CHLORIDE 20 MG/ML IJ SOLN
INTRAMUSCULAR | Status: DC | PRN
  Administered 2012-06-15: 100 mg via INTRAVENOUS

## 2012-06-15 MED ORDER — LACTATED RINGERS IV SOLN
INTRAVENOUS | Status: DC | PRN
  Administered 2012-06-15: 09:00:00 via INTRAVENOUS

## 2012-06-15 MED ORDER — ACETAMINOPHEN 80 MG PO CHEW
320.0000 mg | CHEWABLE_TABLET | ORAL | Status: DC | PRN
  Filled 2012-06-15: qty 4

## 2012-06-15 MED ORDER — LIDOCAINE HCL (CARDIAC) 20 MG/ML IV SOLN
INTRAVENOUS | Status: DC | PRN
  Administered 2012-06-15: 80 mg via INTRAVENOUS

## 2012-06-15 MED ORDER — ALBUTEROL SULFATE HFA 108 (90 BASE) MCG/ACT IN AERS: 2.0000 | INHALATION_SPRAY | Freq: Four times a day (QID) | RESPIRATORY_TRACT | Status: DC | PRN

## 2012-06-15 MED ORDER — LIDOCAINE-EPINEPHRINE 1 %-1:100000 IJ SOLN
INTRAMUSCULAR | Status: DC | PRN
  Administered 2012-06-15: 10 mL

## 2012-06-15 MED ORDER — ONDANSETRON HCL 4 MG/2ML IJ SOLN
INTRAMUSCULAR | Status: DC | PRN
  Administered 2012-06-15: 4 mg via INTRAVENOUS

## 2012-06-15 SURGICAL SUPPLY — 57 items
AIRSTRIP 4 3/4X3 1/4 7185 (GAUZE/BANDAGES/DRESSINGS) IMPLANT
APL SKNCLS STERI-STRIP NONHPOA (GAUZE/BANDAGES/DRESSINGS)
ATTRACTOMAT 16X20 MAGNETIC DRP (DRAPES) IMPLANT
BANDAGE CONFORM 2  STR LF (GAUZE/BANDAGES/DRESSINGS) IMPLANT
BANDAGE GAUZE ELAST BULKY 4 IN (GAUZE/BANDAGES/DRESSINGS) IMPLANT
BENZOIN TINCTURE PRP APPL 2/3 (GAUZE/BANDAGES/DRESSINGS) IMPLANT
BLADE SURG 10 STRL SS (BLADE) IMPLANT
BLADE SURG 15 STRL LF DISP TIS (BLADE) IMPLANT
BLADE SURG 15 STRL SS (BLADE)
BLADE SURG ROTATE 9660 (MISCELLANEOUS) IMPLANT
CANISTER SUCTION 2500CC (MISCELLANEOUS) IMPLANT
CATH ROBINSON RED A/P 16FR (CATHETERS) IMPLANT
CLEANER TIP ELECTROSURG 2X2 (MISCELLANEOUS) ×2 IMPLANT
CLOTH BEACON ORANGE TIMEOUT ST (SAFETY) ×2 IMPLANT
CONT SPEC 4OZ CLIKSEAL STRL BL (MISCELLANEOUS) ×2 IMPLANT
COVER SURGICAL LIGHT HANDLE (MISCELLANEOUS) ×2 IMPLANT
CRADLE DONUT ADULT HEAD (MISCELLANEOUS) IMPLANT
DECANTER SPIKE VIAL GLASS SM (MISCELLANEOUS) ×2 IMPLANT
DRAIN PENROSE 1/4X12 LTX STRL (WOUND CARE) IMPLANT
DRAPE LAPAROTOMY TRNSV 102X78 (DRAPE) IMPLANT
DRSG EMULSION OIL 3X3 NADH (GAUZE/BANDAGES/DRESSINGS) IMPLANT
ELECT COATED BLADE 2.86 ST (ELECTRODE) ×2 IMPLANT
ELECT NDL TIP 2.8 STRL (NEEDLE) IMPLANT
ELECT NEEDLE TIP 2.8 STRL (NEEDLE) IMPLANT
ELECT REM PT RETURN 9FT ADLT (ELECTROSURGICAL) ×2
ELECTRODE REM PT RTRN 9FT ADLT (ELECTROSURGICAL) ×1 IMPLANT
GAUZE SPONGE 4X4 16PLY XRAY LF (GAUZE/BANDAGES/DRESSINGS) IMPLANT
GLOVE BIO SURGEON STRL SZ7 (GLOVE) ×2 IMPLANT
GLOVE BIOGEL PI IND STRL 7.5 (GLOVE) ×1 IMPLANT
GLOVE BIOGEL PI INDICATOR 7.5 (GLOVE) ×1
GOWN STRL NON-REIN LRG LVL3 (GOWN DISPOSABLE) ×4 IMPLANT
KIT BASIN OR (CUSTOM PROCEDURE TRAY) ×2 IMPLANT
KIT ROOM TURNOVER OR (KITS) ×2 IMPLANT
MARKER SKIN DUAL TIP RULER LAB (MISCELLANEOUS) ×2 IMPLANT
NDL HYPO 25GX1X1/2 BEV (NEEDLE) ×1 IMPLANT
NEEDLE HYPO 25GX1X1/2 BEV (NEEDLE) ×2 IMPLANT
NS IRRIG 1000ML POUR BTL (IV SOLUTION) ×2 IMPLANT
PAD ARMBOARD 7.5X6 YLW CONV (MISCELLANEOUS) ×4 IMPLANT
PENCIL BUTTON HOLSTER BLD 10FT (ELECTRODE) ×2 IMPLANT
SPONGE GAUZE 4X4 12PLY (GAUZE/BANDAGES/DRESSINGS) IMPLANT
SPONGE LAP 18X18 X RAY DECT (DISPOSABLE) ×2 IMPLANT
STRIP CLOSURE SKIN 1/2X4 (GAUZE/BANDAGES/DRESSINGS) ×2 IMPLANT
SUCTION FRAZIER TIP 10 FR DISP (SUCTIONS) ×2 IMPLANT
SUT MNCRL AB 4-0 PS2 18 (SUTURE) ×2 IMPLANT
SUT VIC AB 2-0 SH 27 (SUTURE) ×2
SUT VIC AB 2-0 SH 27X BRD (SUTURE) ×1 IMPLANT
SUT VIC AB 3-0 SH 27 (SUTURE) ×2
SUT VIC AB 3-0 SH 27XBRD (SUTURE) ×1 IMPLANT
SWAB COLLECTION DEVICE MRSA (MISCELLANEOUS) IMPLANT
SYR BULB IRRIGATION 50ML (SYRINGE) ×2 IMPLANT
SYR CONTROL 10ML LL (SYRINGE) ×2 IMPLANT
TOWEL OR 17X24 6PK STRL BLUE (TOWEL DISPOSABLE) ×2 IMPLANT
TOWEL OR 17X26 10 PK STRL BLUE (TOWEL DISPOSABLE) ×2 IMPLANT
TRAY ENT MC OR (CUSTOM PROCEDURE TRAY) ×2 IMPLANT
TUBE ANAEROBIC SPECIMEN COL (MISCELLANEOUS) IMPLANT
WATER STERILE IRR 1000ML POUR (IV SOLUTION) ×2 IMPLANT
YANKAUER SUCT BULB TIP NO VENT (SUCTIONS) ×2 IMPLANT

## 2012-06-15 SURGICAL SUPPLY — 20 items
BAG DECANTER FOR FLEXI CONT (MISCELLANEOUS) ×2 IMPLANT
BLADE SURG 15 STRL LF DISP TIS (BLADE) ×1 IMPLANT
BLADE SURG 15 STRL SS (BLADE) ×3
CLOTH BEACON ORANGE TIMEOUT ST (SAFETY) ×2 IMPLANT
GAUZE SPONGE 4X4 16PLY XRAY LF (GAUZE/BANDAGES/DRESSINGS) ×2 IMPLANT
GLOVE SS BIOGEL STRL SZ 7.5 (GLOVE) ×1 IMPLANT
GLOVE SUPERSENSE BIOGEL SZ 7.5 (GLOVE) ×1
GLOVE SURG SS PI 6.5 STRL IVOR (GLOVE) ×2 IMPLANT
GOWN STRL NON-REIN LRG LVL3 (GOWN DISPOSABLE) ×2 IMPLANT
NDL HYPO 25GX1X1/2 BEV (NEEDLE) IMPLANT
NEEDLE HYPO 25GX1X1/2 BEV (NEEDLE) ×3 IMPLANT
PACK EENT II TURBAN DRAPE (CUSTOM PROCEDURE TRAY) ×2 IMPLANT
PENCIL BUTTON HOLSTER BLD 10FT (ELECTRODE) ×2 IMPLANT
SWAB COLLECTION DEVICE MRSA (MISCELLANEOUS) ×2 IMPLANT
SYR BULB 3OZ (MISCELLANEOUS) ×2 IMPLANT
SYR BULB IRRIGATION 50ML (SYRINGE) ×2 IMPLANT
SYR CONTROL 10ML LL (SYRINGE) ×2 IMPLANT
TOWEL OR 17X26 10 PK STRL BLUE (TOWEL DISPOSABLE) ×4 IMPLANT
TUBE ANAEROBIC SPECIMEN COL (MISCELLANEOUS) ×2 IMPLANT
YANKAUER SUCT BULB TIP NO VENT (SUCTIONS) ×2 IMPLANT

## 2012-06-15 NOTE — Anesthesia Postprocedure Evaluation (Signed)
  Anesthesia Post-op Note  Patient: [name]  Procedure(s) Performed: Procedure(s) (LRB): INCISION AND DRAINAGE ABSCESS (N/A)  Patient Location: PACU  Anesthesia Type: General  Level of Consciousness: awake  Airway and Oxygen Therapy: Patient Spontanous Breathing  Post-op Pain: mild  Post-op Assessment: Post-op Vital signs reviewed  Post-op Vital Signs: Reviewed  Complications: No apparent anesthesia complications

## 2012-06-15 NOTE — Op Note (Signed)
DATE OF OPERATION: 06/15/12 9:59 AM Surgeon: Melvenia Beam Procedure Performed: 10060-Left-sublabial incision and drainage of left facial abscess  PREOPERATIVE DIAGNOSIS: left facial/sublabial abscess  POSTOPERATIVE DIAGNOSIS: left facial/sublabial abscess  SURGEON: Melvenia Beam ANESTHESIA: GET ESTIMATED BLOOD LOSS: less than 5 mL.  DRAINS: none SPECIMENS: facial abscess for gram stain, anaerobes, aerobes INDICATIONS: The patient is a 55yo with a history of multiple left maxillary carious teeth treated unsuccessfully with penicillin who presents with a left facial/sublabial abscess needing incision and drainage. DESCRIPTION OF OPERATION: The patient was brought to the operating room and was placed in the supine position and intubated and placed under general anesthesia by anesthesiology. The oral cavity and face were prepped and draped with betadine/towels in the standard fashion. The left lateral nasal wall and left sublabial area were injected with 1% lidocaine with epinephrine. After this had taken effect a 15 blade was used to open a standard left sublabial incision. Immediately 20mL of green/yellow purulent fluid drained from the left facial/sublabial abscess cavity. This was cultured and then a blunt hemostat and the surgeon's finger were used to widely open the abscess cavity and decompress the cavity and break up any loculations. of Bacitracin/saline irrigation was then used to irrigate out the abscess cavity. The cavity was left open to allow it to heal by secondary intention and drain as it was grossly infected. The patient was turned back to anesthesia and awakened from anesthesia  without difficulty. The patient tolerated the procedure well with no immediate complications and was taken to the postoperative recovery area in good condition.   Dr. Melvenia Beam was present and performed the entire procedure. 06/15/12 9:59 AM Melvenia Beam

## 2012-06-15 NOTE — H&P (Signed)
Family Medicine Teaching Service Attending Note  I interviewed and examined patient [name] and reviewed their tests and x-rays.  I discussed with Dr. Fara Boros and reviewed their note for today.  I agree with their assessment and plan.     Additionally  Facial abscess with probable sinus involvement without systemic signs  Antibiotics ENT consult

## 2012-06-15 NOTE — H&P (Signed)
[name] is an 56 y.o. male.    Chief Complaint: Left-sided facial abscess HPI: Pt is a 56 yo male with h/o HTN, COPD, and tobacco use who presents to the ED with left-sided facial abscess.  Patient has poor dentition and reports he has had off and on pain and gum swelling on this side.  He presented to the ED on 7/20 for the same issue, at which point abscess was noted with no systemic symptoms.  Patient was prescribed penicillin and norco and sent home.  He reports taking medications as prescribed (including PCN x 5 days) and noted that the swelling went down and abscess was draining into his nose, but he ran out of pain medication and pain began increasing, as did erythema around the abscess.  He also reported some subjective fevers on and off, but denied chills, headache, vision or hearing changes, nausea/vomiting, and diarrhea.   He has not been to see a dentist because he states he cannot afford it.    ED course: Patient received vancomycin x 1, fluid bolus and labs (WBC 10.7, Cr 1.4 (slightly above baseline 1.2), CBG wnl).  Past Medical History  Diagnosis Date  . COPD (chronic obstructive pulmonary disease)   . Hypertension   . Asthma   . Shortness of breath     Past Surgical History  Procedure Date  . Colonoscopy     Family History  Problem Relation Age of Onset  . Cancer Father    Social History:  reports that he quit smoking about 6 months ago. His smoking use included Cigarettes. He has a 7.5 pack-year smoking history. He has never used smokeless tobacco. He reports that he drinks alcohol. He reports that he does not use illicit drugs. Also reports no etoh or drug use.  Allergies:  Allergies  Allergen Reactions  . Tiotropium Bromide Monohydrate     hallucinations     (Not in a hospital admission)  Results for orders placed during the hospital encounter of 06/15/12 (from the past 48 hour(s))  CBC WITH DIFFERENTIAL     Status: Abnormal   Collection Time   06/15/12  1:32 AM      Component Value Range Comment   WBC 10.7 (*) 4.0 - 10.5 K/uL    RBC 4.17 (*) 4.22 - 5.81 MIL/uL    Hemoglobin 13.1  13.0 - 17.0 g/dL    HCT 16.1 (*) 09.6 - 52.0 %    MCV 92.3  78.0 - 100.0 fL    MCH 31.4  26.0 - 34.0 pg    MCHC 34.0  30.0 - 36.0 g/dL    RDW 04.5  40.9 - 81.1 %    Platelets 326  150 - 400 K/uL    Neutrophils Relative 60  43 - 77 %    Neutro Abs 6.4  1.7 - 7.7 K/uL    Lymphocytes Relative 27  12 - 46 %    Lymphs Abs 2.9  0.7 - 4.0 K/uL    Monocytes Relative 9  3 - 12 %    Monocytes Absolute 1.0  0.1 - 1.0 K/uL    Eosinophils Relative 4  0 - 5 %    Eosinophils Absolute 0.4  0.0 - 0.7 K/uL    Basophils Relative 0  0 - 1 %    Basophils Absolute 0.0  0.0 - 0.1 K/uL   POCT I-STAT, CHEM 8     Status: Abnormal   Collection Time   06/15/12  1:48 AM  Component Value Range Comment   Sodium 145  135 - 145 mEq/L    Potassium 3.9  3.5 - 5.1 mEq/L    Chloride 112  96 - 112 mEq/L    BUN 14  6 - 23 mg/dL    Creatinine, Ser 1.61 (*) 0.50 - 1.35 mg/dL    Glucose, Bld 77  70 - 99 mg/dL    Calcium, Ion 0.96  0.45 - 1.23 mmol/L    TCO2 22  0 - 100 mmol/L    Hemoglobin 13.3  13.0 - 17.0 g/dL    HCT 40.9  81.1 - 91.4 %    No results found.  ROS per HPI; otherwise, negative.  Blood pressure 150/97, pulse 89, temperature 99.2 F (37.3 C), temperature source Oral, resp. rate 20, SpO2 99.00%. Physical Exam  Gen: NAD CV: RRR, no murmurs, rubs, gallops PULM: scattered wheezes throughout, no increased work of breathing ABD: soft/nontender/nondistended/NABS HEENT: large firm swollen area at left paranasal/maxillary sinus area on face, with surrounding erythema and tenderness to palpation; lateral left nostril with some swelling and left nare with two small areas that look like drainage sites; Poor dentition; MMM  Assessment/Plan Pt is a 56 yo male with h/o HTN, COPD, and tobacco use who presents to the ED with left-sided facial abscess.  #L-sided facial  abscess - PCN was likely treating infection, but will likely fully resolve only with incision and drainage. - CT maxillofacial to evaluate abscess  - Consult ENT in AM for potential I&D - NPO for now prior to procedure - Hydrate with NS@125cc /hour - Monitor VS for fever or signs of sepsis - Continue penicillin, as this appears to have been treating infection (s/p vanc x1 in ED) - Recommend dental f/u (pt has Medicare)  #COPD - Currently stable; continue home albuterol, allegra, and advair  #HTN - Currently stable-elevated; continue home norvasc  #Tobacco abuse - Pt previously reported that he had quit, now states he occasionally smokes 1 cigarette/day.   - No nicotine patch required  FEN/GI: NPO until evaluated for surgical procedure; NS at 125cc/hour PPX: SCDs, given possible surgical procedure in AM Dispo: Pending ENT recs and clinical improvement Code status: Not addressed  Simone Curia 06/15/2012, 3:02 AM   UPPER LEVEL ADDENDUM  I have seen and examined [name] with Dr. Lovenia Shuck and I agree with the above assessment/plan. I have reviewed all available data and have made any necessary changes to the above H&P.  Lucia Mccreadie 06/15/2012, 3:30AM

## 2012-06-15 NOTE — Anesthesia Procedure Notes (Signed)
Procedure Name: Intubation Date/Time: 06/15/2012 9:18 AM Performed by: Glendora Score A Pre-anesthesia Checklist: Patient identified, Emergency Drugs available, Suction available and Patient being monitored Patient Re-evaluated:Patient Re-evaluated prior to inductionOxygen Delivery Method: Circle system utilized Preoxygenation: Pre-oxygenation with 100% oxygen Intubation Type: IV induction, Rapid sequence and Cricoid Pressure applied Laryngoscope Size: Miller and 2 Grade View: Grade I Tube type: Oral Tube size: 7.5 mm Number of attempts: 1 Airway Equipment and Method: Stylet and LTA kit utilized Placement Confirmation: ETT inserted through vocal cords under direct vision,  positive ETCO2 and breath sounds checked- equal and bilateral Secured at: 22 cm Tube secured with: Tape Dental Injury: Teeth and Oropharynx as per pre-operative assessment

## 2012-06-15 NOTE — ED Notes (Signed)
Patient transported to CT 

## 2012-06-15 NOTE — ED Notes (Addendum)
Pt received bed assignment. Report given to Cybill, receiving RN for pt to room 6734.

## 2012-06-15 NOTE — Consult Note (Signed)
06/15/12 8:01 AM  [name]  PREOPERATIVE HISTORY AND PHYSICAL  CHIEF COMPLAINT: left facial abscess  HISTORY: This is a 56 year old who presents with left dental pain and facial swelling for several days. Was seen in the ER several days ago and prescribed penicillin. Admitted last night with worsening facial pain and swelling and a Ct scan that shows a 3cm left sublabial abscess with some adjacent dental disease. He now presents for incision and drainage of left facial abscess.  Dr. Emeline Darling, [place] has discussed the risks, benefits, and alternatives of this procedure. The patient understands the risks and would like to proceed with the procedure. The chances of success of the procedure are >50% and the patient understands this. I personally performed an examination of the patient within 24 hours of the procedure.  PAST MEDICAL HISTORY: Past Medical History  Diagnosis Date  . COPD (chronic obstructive pulmonary disease)   . Hypertension   . Asthma   . Shortness of breath     PAST SURGICAL HISTORY: Past Surgical History  Procedure Date  . Colonoscopy     MEDICATIONS: No current facility-administered medications on file prior to encounter.   Current Outpatient Prescriptions on File Prior to Encounter  Medication Sig Dispense Refill  . albuterol (PROVENTIL HFA;VENTOLIN HFA) 108 (90 BASE) MCG/ACT inhaler Inhale 2 puffs into the lungs every 6 (six) hours as needed. For shortness of breath or wheezing      . albuterol (PROVENTIL) (2.5 MG/3ML) 0.083% nebulizer solution Take 2.5 mg by nebulization 4 (four) times daily as needed. For shortness of breath       . amLODipine (NORVASC) 10 MG tablet Take 10 mg by mouth daily.      Marland Kitchen aspirin 81 MG EC tablet Take 81 mg by mouth daily.        . fexofenadine (ALLEGRA) 180 MG tablet Take 180 mg by mouth daily.       . Fluticasone-Salmeterol (ADVAIR) 500-50 MCG/DOSE AEPB Inhale 1 puff into the lungs 2 (two) times daily.      Marland Kitchen  HYDROcodone-acetaminophen (NORCO) 5-325 MG per tablet Take 2 tablets by mouth every 4 (four) hours as needed for pain.  20 tablet  0  . penicillin v potassium (VEETID) 500 MG tablet Take 1 tablet (500 mg total) by mouth 3 (three) times daily.  30 tablet  0    ALLERGIES: Allergies  Allergen Reactions  . Tiotropium Bromide Monohydrate     hallucinations     SOCIAL HISTORY: History   Social History  . Marital Status: Married    Spouse Name: N/A    Number of Children: N/A  . Years of Education: N/A   Occupational History  . Not on file.   Social History Main Topics  . Smoking status: Former Smoker -- 0.2 packs/day for 30 years    Types: Cigarettes    Quit date: 11/23/2011  . Smokeless tobacco: Never Used  . Alcohol Use: Yes     rare/occasional  . Drug Use: No  . Sexually Active: Yes -- Male partner(s)   Other Topics Concern  . Not on file   Social History Narrative  . No narrative on file    FAMILY HISTORY: Family History  Problem Relation Age of Onset  . Cancer Father     REVIEW OF SYSTEMS:  Left facial pain and swelling, otherwise negative x 10 systems except per HPI   PHYSICAL EXAM:  GENERAL:  Wake, alert VITAL SIGNS:   Filed Vitals:   06/15/12  0618  BP: 121/71  Pulse: 78  Temp: 98.7 F (37.1 C)  Resp: 20   SKIN:  Induration and erythema around the left facial/nasal area HEENT:  Left facial and sublabial areas show induration and fluctuance consistent with left facial abscess NECK:  Supple, trachea midline LYMPH:  No LAD LUNGS:  Grossly clear CARDIOVASCULAR:  RRR ABDOMEN:  Soft, nontender MUSCULOSKELETAL: normal strength PSYCH:  Agitated, poorly cooperative with exam NEUROLOGIC:  CN 2-12 grossly intact and symmetric  DIAGNOSTIC STUDIES: CT maxillofacial shows rim-enhancing left facial/sublabial  abscess  ASSESSMENT AND PLAN: I attempted bedside incision and drainage but patient is agitated and would not cooperate with even oral cavity exam or  local anesthesia. Will plan on operative I&D in OR. Plan to proceed with left facial abscess incision and drainage and washout. Patient understands the risks, benefits, and alternatives. Informed written consent on chart. 06/15/12 8:01 AM [name]

## 2012-06-15 NOTE — ED Notes (Signed)
55/M brought himself to er for c/o upper dental pain and infection that began last week. Pt aao x 4, visible facial swelling on left side of nose,visible missing teeth, and poor dental hygiene visible. Awaiting new orders. Will continue to monitor pt.

## 2012-06-15 NOTE — Transfer of Care (Signed)
Immediate Anesthesia Transfer of Care Note  Patient: [name]  Procedure(s) Performed: Procedure(s) (LRB): INCISION AND DRAINAGE ABSCESS (N/A)  Patient Location: PACU  Anesthesia Type: General  Level of Consciousness: awake, alert , oriented and patient cooperative  Airway & Oxygen Therapy: Patient Spontanous Breathing and Patient connected to face mask oxygen  Post-op Assessment: Report given to PACU RN and Post -op Vital signs reviewed and stable  Post vital signs: Reviewed and stable  Complications: No apparent anesthesia complications

## 2012-06-15 NOTE — Preoperative (Signed)
Beta Blockers   Reason not to administer Beta Blockers:Not Applicable 

## 2012-06-15 NOTE — ED Notes (Signed)
Report given to Lana, receiving RN for pt to observation.

## 2012-06-15 NOTE — ED Provider Notes (Signed)
History     CSN: [id number]  Arrival date & time 06/14/12  2201   First MD Initiated Contact with Patient 06/15/12 0110      Chief Complaint  Patient presents with  . Abscess    (Consider location/radiation/quality/duration/timing/severity/associated sxs/prior treatment) Patient is a 56 y.o. male presenting with abscess. The history is provided by the patient.  Abscess  This is a recurrent problem. Pertinent negatives include no congestion and no cough.   patient has a worsening of his facial and dental abscess. He was seen in ER for the same 6 days ago. He's had worsening swelling and continued pain. He states he is out of the medicines at home and still has his antibiotics. No fevers. He states the swelling on his face has increased. He states he had some drainage into his mouth.  Past Medical History  Diagnosis Date  . COPD (chronic obstructive pulmonary disease)   . Hypertension   . Asthma   . Shortness of breath     Past Surgical History  Procedure Date  . Colonoscopy     Family History  Problem Relation Age of Onset  . Cancer Father     History  Substance Use Topics  . Smoking status: Former Smoker -- 0.2 packs/day for 30 years    Types: Cigarettes    Quit date: 11/23/2011  . Smokeless tobacco: Never Used  . Alcohol Use: Yes     rare/occasional      Review of Systems  Constitutional: Positive for fatigue. Negative for chills.  HENT: Positive for facial swelling. Negative for ear pain, nosebleeds, congestion, neck pain and neck stiffness.   Respiratory: Negative for cough and shortness of breath.   Cardiovascular: Negative for chest pain.  Genitourinary: Negative for flank pain.  Musculoskeletal: Negative for back pain.  Neurological: Positive for headaches.    Allergies  Tiotropium bromide monohydrate  Home Medications   No current outpatient prescriptions on file.  BP 143/88  Pulse 72  Temp 98.4 F (36.9 C) (Oral)  Resp 20  Ht 5\' 9"   (1.753 m)  Wt 148 lb (67.132 kg)  BMI 21.86 kg/m2  SpO2 99%  Physical Exam  Constitutional: He appears well-developed and well-nourished.  HENT:       Swollen indurated area to left of nose involving inside the left nose. No drainage. Approximately 4 cm x 3 cm. Poor dentition. Missing front teeth.  Eyes: Pupils are equal, round, and reactive to light.  Neck: Neck supple.  Cardiovascular: Normal rate.   Pulmonary/Chest: Effort normal and breath sounds normal.  Abdominal: Soft. There is no tenderness.  Musculoskeletal: Normal range of motion.  Neurological: He is alert.  Skin: Skin is warm.    ED Course  Procedures (including critical care time)  Labs Reviewed  CBC WITH DIFFERENTIAL - Abnormal; Notable for the following:    WBC 10.7 (*)     RBC 4.17 (*)     HCT 38.5 (*)     All other components within normal limits  POCT I-STAT, CHEM 8 - Abnormal; Notable for the following:    Creatinine, Ser 1.40 (*)     All other components within normal limits  BASIC METABOLIC PANEL  CBC   Ct Maxillofacial W/cm  06/15/2012  *RADIOLOGY REPORT*  Clinical Data: Facial swelling.  Question abscess.  CT MAXILLOFACIAL WITH CONTRAST  Technique:  Multidetector CT imaging of the maxillofacial structures was performed with intravenous contrast. Multiplanar CT image reconstructions were also generated.  Contrast: 80mL OMNIPAQUE  IOHEXOL 300 MG/ML  SOLN  Comparison: None.  Findings: There is a complex peripherally enhancing fluid collection extending from the alveolar ridge of the maxilla along the left aspect of the nose.  This is difficult to measure given its irregular shape, but measures up to 3.8 cm cephalocaudad.  No extension of inflammation into the orbits is visualized.  There is mild mucosal thickening in the maxillary sinuses bilaterally.  No air-fluid levels are identified.  The additional paranasal sinuses, mastoid air cells and middle ears are clear.  No intracranial abnormalities are seen.  No  acute bone destruction is identified adjacent to the fluid collection.  However, there is some underlying periodontal disease. In the maxilla, this appears most prominent along the previously extracted incisors. There is more pronounced lucency surrounding the most posterior remaining left mandibular molar, likely tooth number 19. There are multiple dental caries.  The temporal mandibular joints are intact.  IMPRESSION:  1.  Left facial abscess extending along the left aspect of the nose anterior to the maxilla.  This may be secondary to underlying maxillary periodontal disease. 2.  No intraorbital extension or significant underlying sinus disease. 3.  In addition to the maxillary periodontal disease, there are multiple dental caries and prominent left mandibular periodontal disease.  Original Report Authenticated By: Gerrianne Scale, M.D.     1. Facial abscess       MDM  Patient with facial swelling and abscess. Likely dental source. Patient has been on antibiotics and has had continued pain and swelling. Patient be admitted to family practice Center for IV antibiotics.        Juliet Rude. Rubin Payor, MD 06/15/12 [id number]

## 2012-06-15 NOTE — Progress Notes (Signed)
ENT post-op note  Subjective: POD#0 from sublabial incision and drainage left facial abscess, awake, somewhat groggy from the anesthesia but follows commands  Objective: Vital signs in last 24 hours: Temp:  [98.4 F (36.9 C)-99.2 F (37.3 C)] 98.7 F (37.1 C) (07/26 0618) Pulse Rate:  [72-89] 86  (07/26 1003) Resp:  [20-29] 29  (07/26 1003) BP: (121-161)/(71-97) 161/92 mmHg (07/26 1002) SpO2:  [95 %-99 %] 97 % (07/26 1003) Weight:  [67.132 kg (148 lb)] 67.132 kg (148 lb) (07/26 0427)  Facial sensation intact to palpation in all three divisions bilaterally. EOMI, PERRLA, CN 2-12 grossly intact and symmetric, + cough, left facial erythema and edema slightly improved.  @LABLAST2 (wbc:2,hgb:2,hct:2,plt:2)  Basename 06/15/12 0815 06/15/12 0148  NA 142 145  K 3.4* 3.9  CL 109 112  CO2 20 --  GLUCOSE 73 77  BUN 11 14  CREATININE 1.02 1.40*  CALCIUM 8.9 --    Medications:  Scheduled Meds:   . albuterol  2.5 mg Nebulization Q4H  . albuterol      . amLODipine  10 mg Oral Daily  . aspirin EC  81 mg Oral Daily  . chlorhexidine  15 mL Mouth Rinse BID  . clindamycin (CLEOCIN) IV  300 mg Intravenous Q8H  . dexamethasone  10 mg Intravenous Q8H  . doxycycline (VIBRAMYCIN) IV  100 mg Intravenous Q12H  . Fluticasone-Salmeterol  1 puff Inhalation BID  .  HYDROmorphone (DILAUDID) injection  1 mg Intravenous Once  . loratadine  10 mg Oral Daily  . metroNIDAZOLE  500 mg Oral Q8H  . sodium chloride  500 mL Intravenous Once  . vancomycin  1,000 mg Intravenous Once  . DISCONTD: aspirin  81 mg Oral Daily  . DISCONTD: penicillin v potassium  500 mg Oral TID   Continuous Infusions:   . sodium chloride 125 mL/hr at 06/15/12 0408  . sodium chloride     PRN Meds:.acetaminophen, albuterol, HYDROcodone-acetaminophen, HYDROmorphone (DILAUDID) injection, iohexol, morphine injection, ondansetron (ZOFRAN) IV, ondansetron (ZOFRAN) IV, ondansetron, polyethylene glycol, DISCONTD: 0.9 % irrigation  (POUR BTL), DISCONTD: lidocaine-EPINEPHrine, DISCONTD: polymyxin / bacitracin (DOUBLE ANTIBIOTIC) irrigation  Assessment/Plan: Post-op pain control, monitor for clinical improvement. D/C'd penicillin, will start Flagyl, clindamycin, doxycycline and await cultures. When improved clinically and cultures back can be discharged on 2 weeks of appropriate oral antibiotic. Needs to see dental to get the inciting dental disease treated. Follow up with Dr. Emeline Darling in 2-3 weeks. Will give decadron x 3 doses for inflammation.   LOS: 0 days   Melvenia Beam 06/15/2012, 10:12 AM

## 2012-06-15 NOTE — Anesthesia Preprocedure Evaluation (Addendum)
Anesthesia Evaluation  Patient identified by MRN, date of birth, ID band Patient awake    Reviewed: Allergy & Precautions, H&P , NPO status , Patient's Chart, lab work & pertinent test results, reviewed documented beta blocker date and time   Airway Mallampati: I  Neck ROM: Full    Dental  (+) Teeth Intact   Pulmonary shortness of breath and with exertion, asthma , COPD COPD inhaler, former smoker,  breath sounds clear to auscultation        Cardiovascular hypertension, Pt. on medications Rhythm:Regular Rate:Normal     Neuro/Psych    GI/Hepatic negative GI ROS, Neg liver ROS,   Endo/Other  negative endocrine ROS  Renal/GU      Musculoskeletal   Abdominal   Peds  Hematology   Anesthesia Other Findings   Reproductive/Obstetrics                          Anesthesia Physical Anesthesia Plan  ASA: III  Anesthesia Plan: General   Post-op Pain Management:    Induction: Intravenous  Airway Management Planned: Oral ETT and Nasal ETT  Additional Equipment:   Intra-op Plan:   Post-operative Plan: Extubation in OR  Informed Consent: I have reviewed the patients History and Physical, chart, labs and discussed the procedure including the risks, benefits and alternatives for the proposed anesthesia with the patient or authorized representative who has indicated his/her understanding and acceptance.   Dental advisory given  Plan Discussed with: CRNA, Anesthesiologist and Surgeon  Anesthesia Plan Comments:        Anesthesia Quick Evaluation

## 2012-06-15 NOTE — Progress Notes (Signed)
Nursing Admission Note  Pt arrived to 6734 via stretcher from the ED at 0430. A&Ox4. No distress noted. Pt is currently alone, but states he lives at home with his wife. Pt's left side of his face, especially beside his nose and upper jaw, is notably swollen. Pt c/o significant amounts of pain in this side of his face. Oriented to unit and surroundings. Call bell within reach. Will continue to monitor. C.Izetta Sakamoto, RN.

## 2012-06-16 MED ORDER — DEXAMETHASONE 6 MG PO TABS: 6.0000 mg | ORAL_TABLET | Freq: Two times a day (BID) | ORAL | Status: AC

## 2012-06-16 MED ORDER — HYDROCODONE-ACETAMINOPHEN 5-325 MG PO TABS: 2.0000 | ORAL_TABLET | ORAL | Status: AC | PRN

## 2012-06-16 MED ORDER — DOXYCYCLINE HYCLATE 50 MG PO CAPS: 50.0000 mg | ORAL_CAPSULE | Freq: Two times a day (BID) | ORAL | Status: DC

## 2012-06-16 NOTE — Progress Notes (Signed)
Family Medicine Teaching Service Daily Progress Note  Subjective: Patient says he is feeling much better, tolerating PO.  He says he would like to go home today.  Objective: Vital signs in last 24 hours: Filed Vitals:   06/15/12 1635 06/15/12 1800 06/15/12 2100 06/16/12 0527  BP:  116/79 128/70 149/82  Pulse:  87 76 66  Temp:  98.2 F (36.8 C) 98.2 F (36.8 C) 98.4 F (36.9 C)  TempSrc:  Oral Oral Oral  Resp:  18 20 18   Height:      Weight:      SpO2: 97% 100% 100% 100%   Weight change:   Intake/Output Summary (Last 24 hours) at 06/16/12 1610 Last data filed at 06/16/12 0558  Gross per 24 hour  Intake   4415 ml  Output    475 ml  Net   3940 ml   General appearance: alert, cooperative and no distress Head: Normocephalic, without obvious abnormality, atraumatic Eyes: PERRL, EOMIT Nose: Some dried blood visible in left nostril, otherwise no drainage.  Throat: oral mucosa moist, poor dentition, no abscess or drainage  Neck: no adenopathy, no JVD, supple, symmetrical, trachea midline and thyroid not enlarged, symmetric, no tenderness/mass/nodules Lungs: clear to auscultation bilaterally Heart: regular rate and rhythm, S1, S2 normal, no murmur, click, rub or gallop Extremities: extremities normal, atraumatic, no cyanosis or edema Lab Results:  Basename 06/15/12 0815 06/15/12 0148 06/15/12 0132  WBC 11.6* -- 10.7*  HGB 12.4* 13.3 --  HCT 36.1* 39.0 --  PLT 347 -- 326    Basename 06/15/12 0815 06/15/12 0148  NA 142 145  K 3.4* 3.9  CL 109 112  CO2 20 --  GLUCOSE 73 77  BUN 11 14  CREATININE 1.02 1.40*  CALCIUM 8.9 --   Micro Results: Recent Results (from the past 240 hour(s))  SURGICAL PCR SCREEN     Status: Normal   Collection Time   06/15/12  8:23 AM      Component Value Range Status Comment   MRSA, PCR NEGATIVE  NEGATIVE Final    Staphylococcus aureus NEGATIVE  NEGATIVE Final   CULTURE, ROUTINE-ABSCESS     Status: Normal (Preliminary result)   Collection  Time   06/15/12  8:53 AM      Component Value Range Status Comment   Specimen Description ABSCESS FACE LEFT   Final    Special Requests FACIAL ABSCESS   Final    Gram Stain PENDING   Incomplete    Culture NO GROWTH 1 DAY   Final    Report Status PENDING   Incomplete   GRAM STAIN     Status: Normal   Collection Time   06/15/12  8:53 AM      Component Value Range Status Comment   Specimen Description ABSCESS FACE LEFT   Final    Special Requests FACIAL ABSCESS   Final    Gram Stain     Final    Value: ABUNDANT WBC PRESENT, PREDOMINANTLY PMN     MODERATE GRAM POSITIVE COCCI IN PAIRS IN CHAINS IN CLUSTERS     MODERATE GRAM NEGATIVE RODS     Gram Stain Report Called to,Read Back By and Verified With: Cleon Dew IN OR6 AT 1040 06/15/12 BY K BARR   Report Status 06/15/2012 FINAL   Final    Studies/Results:  Medications: I have reviewed the patient's current medications. Scheduled Meds:   . acidophilus  2 capsule Oral TID  . albuterol  2.5 mg Nebulization Q4H  .  albuterol      . amLODipine  10 mg Oral Daily  . aspirin EC  81 mg Oral Daily  . chlorhexidine  15 mL Mouth Rinse BID  . clindamycin (CLEOCIN) IV  300 mg Intravenous Q8H  . dexamethasone  10 mg Intravenous Q8H  . doxycycline (VIBRAMYCIN) IV  100 mg Intravenous Q12H  . Fluticasone-Salmeterol  1 puff Inhalation BID  . HYDROmorphone      . loratadine  10 mg Oral Daily  . metroNIDAZOLE  500 mg Oral Q8H  . DISCONTD: Fluticasone-Salmeterol  1 puff Inhalation BID  . DISCONTD: lactobacillus acidophilus  2 tablet Oral TID   Continuous Infusions:   . sodium chloride 125 mL/hr at 06/15/12 1323  . sodium chloride     PRN Meds:.acetaminophen, albuterol, HYDROcodone-acetaminophen, morphine injection, ondansetron (ZOFRAN) IV, ondansetron, polyethylene glycol, DISCONTD: 0.9 % irrigation (POUR BTL), DISCONTD:  HYDROmorphone (DILAUDID) injection, DISCONTD: lidocaine-EPINEPHrine, DISCONTD: ondansetron (ZOFRAN) IV, DISCONTD: polymyxin /  bacitracin (DOUBLE ANTIBIOTIC) irrigation  Assessment/Plan: KLEIN WILLCOX is a 56 y.o. male patient, Hospital day 1 for  1. Facial abscess   - POD #1 s/p I & D by ENT for abscess - Patient requesting to be discharged.   - Discussed with ENT, patient will be discharged on Doxycycline and Steroid dose pack.   #COPD - Currently stable; continue home albuterol, allegra, and advair   #HTN - Currently stable-elevated; continue home norvasc   FEN/GI:d/c IVF and heart healthy diet.  PPX: SCD's  Dispo: Will discharge home with antibiotics, steroids, and close follow up.    LOS: 1 day   Devion Chriscoe 06/16/2012, 8:11 AM

## 2012-06-16 NOTE — Progress Notes (Signed)
FMTS Attending Daily Note: Kayline Sheer MD [phone] pager office [phone] I have discussed this patient with the resident and reviewed the assessment and plan as documented above. I agree wit the resident's findings and plan.  

## 2012-06-17 NOTE — Discharge Summary (Signed)
Family Medicine Teaching Service  Discharge Note : Attending Ashleynicole Mcclees MD Pager [phone] Office [phone] I have seen and examined this patient, reviewed their chart and discussed discharge planning wit the resident at the time of discharge. I agree with the discharge plan as above.  

## 2012-06-17 NOTE — Discharge Summary (Signed)
Family Medicine Teaching [facility] Discharge Summary  Patient name: ALAN DRUMMER Medical record number: [id number] Date of birth: 1956/01/26 Age: 56 y.o. Gender: male Date of Admission: 06/15/2012  Date of Discharge: 06/16/2012 Admitting Physician: Carney Living, MD  Primary Care Provider: Demetria Pore, MD  Indication for Hospitalization: facial abscess Discharge Diagnoses:  Facial abscess COPD HTN  Consultations: ENT (Dr. Emeline Darling)  Significant Labs and Imaging:   Lab 06/15/12 0815 06/15/12 0148 06/15/12 0132  WBC 11.6* -- 10.7*  HGB 12.4* 13.3 13.1  HCT 36.1* 39.0 38.5*  PLT 347 -- 326    Lab 06/15/12 0815 06/15/12 0148  NA 142 145  K 3.4* 3.9  CL 109 112  CO2 20 --  BUN 11 14  CREATININE 1.02 1.40*  LABGLOM -- --  GLUCOSE 73 --  CALCIUM 8.9 --   Facial abscess  Gram stain: ABUNDANT WBC PRESENT, PREDOMINANTLY PMN MODERATE GRAM POSITIVE COCCI IN PAIRS IN CHAINS IN CLUSTERS RARE GRAM NEGATIVE RODS  Culture: pending  Imaging: CT, Maxillofacial with Contrast, 7/26 IMPRESSION:  1. Left facial abscess extending along the left aspect of the nose  anterior to the maxilla. This may be secondary to underlying  maxillary periodontal disease.  2. No intraorbital extension or significant underlying sinus  disease.  3. In addition to the maxillary periodontal disease, there are  multiple dental caries and prominent left mandibular periodontal  disease.   Procedures: I&D of facial abscess under general anesthesia, by Dr. Emeline Darling (ENT)  Brief Hospital Course: Pt is a 56 yo male who presented to the ED with left-sided facial abscess on 7/26. He presented to the ED on 7/20 for the same issue, at which point abscess was noted with no systemic symptoms. Patient was prescribed penicillin and norco, which he took for 5 days, then noted that the swelling went down and abscess was draining into his nose, but he ran out of pain medication and pain began increasing, as did  erythema around the abscess. He also reported some subjective fevers on and off, but denied chills, headache, vision or hearing changes, nausea/vomiting, and diarrhea. He has not been to see a dentist because he states he cannot afford it. ENT was consulted and Dr. Emeline Darling performed an I&D through a sublabial incision, draining ~20 mL of purulent material that was sent for culture (which is pending). The incision was left open to heal by secondary intention.   1. Facial abscess - Pt discharged POD #1 s/p I & D by ENT for abscess, as above. Dr. Lula Olszewski discussed pt with ENT and he was discharged on Doxycycline and a steroid dose pack (Dr. Emeline Darling initially planned on Decadron x3 for inflammation), as he requested to be allowed to go home. At time of discharge, pt had continued to be afebrile without systemic symptoms; pt may need to be contacted with new Rx for abx, depending on culture results. It was stressed to the pt how important it will be that he takes his medication(s) as recommended and that he follows up in clinic and with a dentist as soon as possible.  2 COPD - Stable throughout admission. Pt was continued on albuterol, Allegra, and Advair.  3. HTN - Stable-elevated throughout admission. Pt was continued on home Norvasc.    Discharge Medications:  Medication List  As of 06/17/2012 11:00 AM   STOP taking these medications         penicillin v potassium 500 MG tablet         TAKE  these medications         albuterol (2.5 MG/3ML) 0.083% nebulizer solution   Commonly known as: PROVENTIL   Take 2.5 mg by nebulization 4 (four) times daily as needed. For shortness of breath      albuterol 108 (90 BASE) MCG/ACT inhaler   Commonly known as: PROVENTIL HFA;VENTOLIN HFA   Inhale 2 puffs into the lungs every 6 (six) hours as needed. For shortness of breath or wheezing      amLODipine 10 MG tablet   Commonly known as: NORVASC   Take 10 mg by mouth daily.      aspirin 81 MG EC tablet   Take  81 mg by mouth daily.      dexamethasone 6 MG tablet   Commonly known as: DECADRON   Take 1 tablet (6 mg total) by mouth 2 (two) times daily with a meal.      doxycycline 50 MG capsule   Commonly known as: VIBRAMYCIN   Take 1 capsule (50 mg total) by mouth 2 (two) times daily.      fexofenadine 180 MG tablet   Commonly known as: ALLEGRA   Take 180 mg by mouth daily.      Fluticasone-Salmeterol 500-50 MCG/DOSE Aepb   Commonly known as: ADVAIR   Inhale 1 puff into the lungs 2 (two) times daily.      HYDROcodone-acetaminophen 5-325 MG per tablet   Commonly known as: NORCO/VICODIN   Take 2 tablets by mouth every 4 (four) hours as needed for pain.           Issues for Follow Up:  1. Please assess for improvement of facial abscess and compliance with abx. Dr. Casper Harrison attempted to get pt an appointment through the emergency dental clinic, but was unable (that clinic accepts uninsured pt's with orange card, Mr. Molden has [employer]). Pt may need to be contacted with new Rx for abx, depending on culture results. 2. Please determine if pt has been scheduled for follow up with Dr. Emeline Darling (ENT), and/or a dentist.  Outstanding Results: facial abscess culture  Discharge Instructions: Please refer to Patient Instructions section of EMR for full details.  Patient was counseled important signs and symptoms that should prompt return to medical care, changes in medications, dietary instructions, activity restrictions, and follow up appointments.  Significant instructions noted below:  Follow-up Information    Schedule an appointment as soon as possible for a visit with Melvenia Beam, MD.   Contact information:   [facility] [address] [place] [place] [address] [phone]       Schedule an appointment as soon as possible for a visit with Demetria Pore, MD.   Contact information:   [address] [place] [address] [phone]       Please follow up. (Please schedule an appointment with a Dentist as soon as possible. )          Discharge Condition: stable  Street, Cristal Deer, MD 06/17/2012, 11:00 AM

## 2012-06-18 ENCOUNTER — Encounter (HOSPITAL_COMMUNITY): Payer: Self-pay | Admitting: Otolaryngology

## 2012-06-18 LAB — CULTURE, ROUTINE-ABSCESS

## 2012-06-20 LAB — ANAEROBIC CULTURE

## 2012-09-12 IMAGING — CR DG CHEST 2V
2 series · 2 of 2 positions shown · non-contrast
Comparison: Two-view chest x-ray 06/27/2011, 09/26/2010, 12/22/2008
[HOSPITAL].

CLINICAL DATA: Cough.  Shortness of breath.  Smoker with history of
COPD.

CHEST - 2 VIEW 11/22/2011:

[w chest pa]
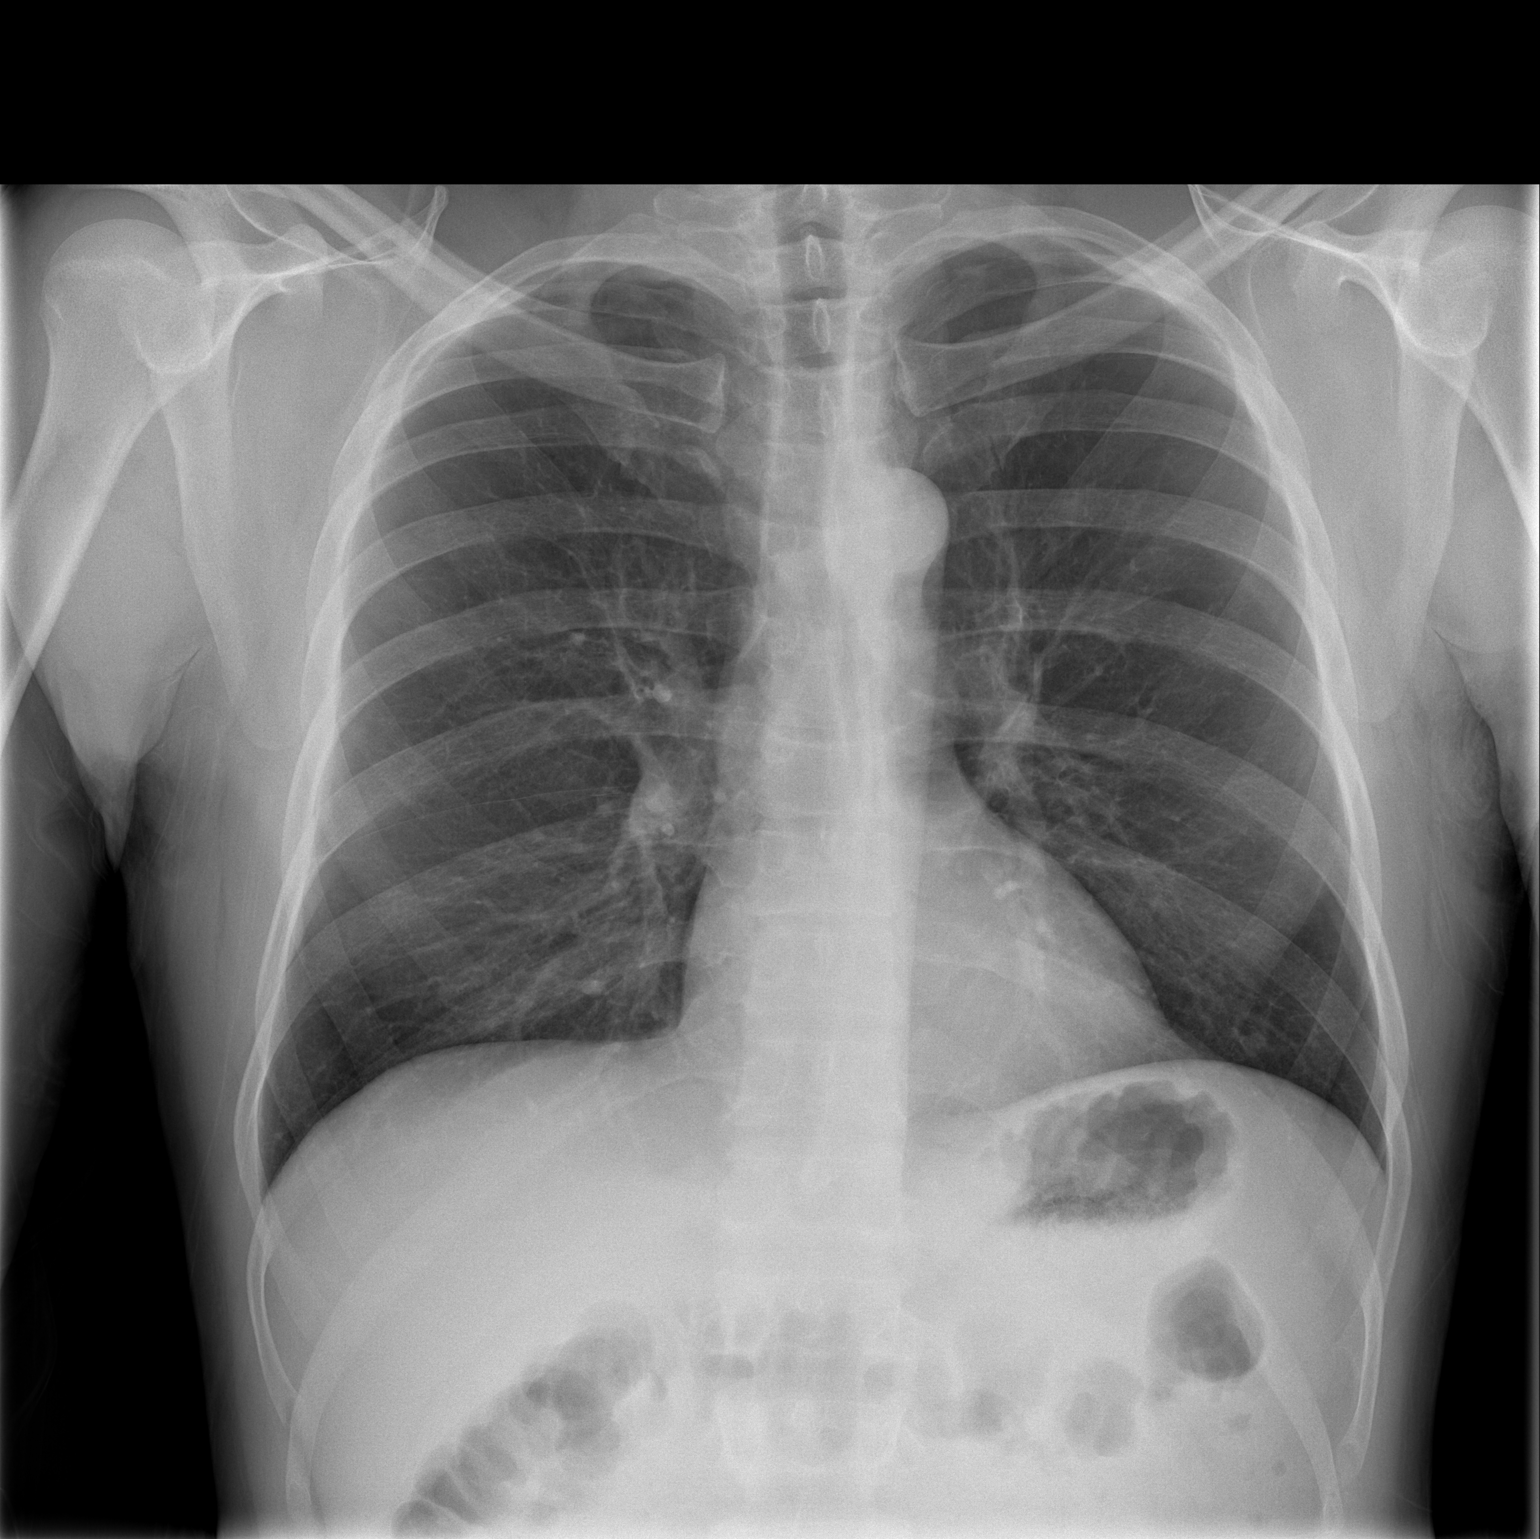

[w chest lat]
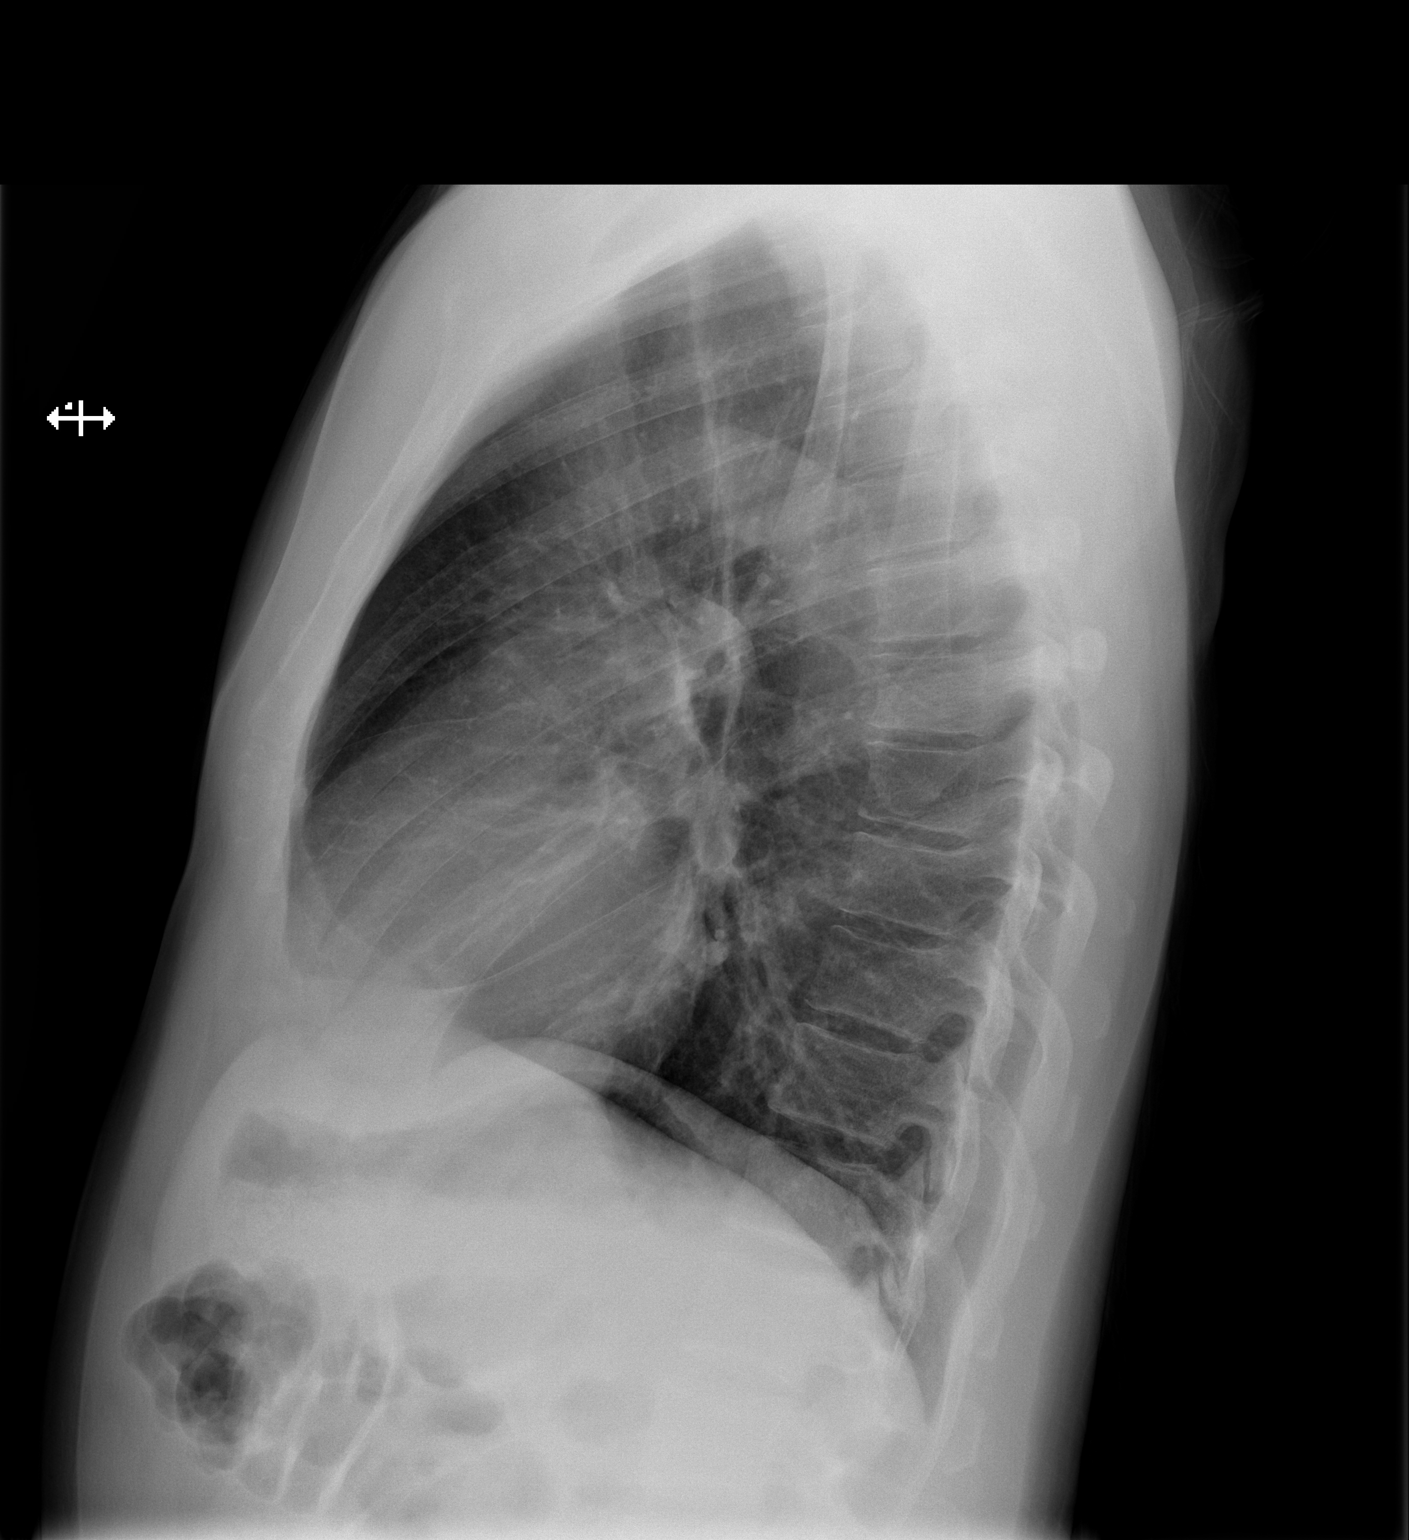

[2 of 2 positions shown; findings below may reference images not displayed]

FINDINGS: Cardiomediastinal silhouette unremarkable and unchanged.
Lungs hyperinflated but clear.  No pleural effusions.  Visualized
bony thorax intact.
IMPRESSION: Stable hyperinflation consistent with COPD and/or asthma.  No acute
cardiopulmonary disease.

## 2012-09-26 ENCOUNTER — Ambulatory Visit: Payer: Medicare Other

## 2012-09-26 ENCOUNTER — Emergency Department (HOSPITAL_COMMUNITY)
Admission: EM | Admit: 2012-09-26 | Discharge: 2012-09-26 | Disposition: A | Discharge status: Home / Self Care | Payer: Medicare Other | Attending: Emergency Medicine | Admitting: Emergency Medicine

## 2012-09-26 ENCOUNTER — Encounter (HOSPITAL_COMMUNITY): Payer: Self-pay | Admitting: Family Medicine

## 2012-09-26 DIAGNOSIS — J449 Chronic obstructive pulmonary disease, unspecified: Secondary | ICD-10-CM | POA: Insufficient documentation

## 2012-09-26 DIAGNOSIS — Z7982 Long term (current) use of aspirin: Secondary | ICD-10-CM | POA: Insufficient documentation

## 2012-09-26 DIAGNOSIS — I10 Essential (primary) hypertension: Secondary | ICD-10-CM | POA: Insufficient documentation

## 2012-09-26 DIAGNOSIS — Z79899 Other long term (current) drug therapy: Secondary | ICD-10-CM | POA: Insufficient documentation

## 2012-09-26 DIAGNOSIS — J4489 Other specified chronic obstructive pulmonary disease: Secondary | ICD-10-CM | POA: Insufficient documentation

## 2012-09-26 DIAGNOSIS — Z87891 Personal history of nicotine dependence: Secondary | ICD-10-CM | POA: Insufficient documentation

## 2012-09-26 DIAGNOSIS — K029 Dental caries, unspecified: Secondary | ICD-10-CM

## 2012-09-26 MED ORDER — PENICILLIN V POTASSIUM 500 MG PO TABS: 500.0000 mg | ORAL_TABLET | Freq: Four times a day (QID) | ORAL | Status: AC

## 2012-09-26 MED ORDER — NAPROXEN 500 MG PO TABS: 500.0000 mg | ORAL_TABLET | Freq: Two times a day (BID) | ORAL | Status: DC

## 2012-09-26 MED ORDER — HYDROCODONE-ACETAMINOPHEN 5-325 MG PO TABS: 1.0000 | ORAL_TABLET | Freq: Four times a day (QID) | ORAL | Status: DC | PRN

## 2012-09-26 MED ORDER — HYDROCODONE-ACETAMINOPHEN 5-325 MG PO TABS
1.0000 | ORAL_TABLET | Freq: Once | ORAL | Status: AC
  Administered 2012-09-26: 1 via ORAL
  Filled 2012-09-26: qty 1

## 2012-09-26 NOTE — ED Notes (Signed)
sts a dental pain on the top right. sts tooth broke off.

## 2012-09-26 NOTE — ED Notes (Signed)
Pt c/o right upper tooth pain that started on Friday, no swelling seen.

## 2012-09-26 NOTE — ED Provider Notes (Signed)
History   This chart was scribed for Celene Kras, MD by Albertha Ghee Rifaie. This patient was seen in room TR05C/TR05C and the patient's care was started at 12:12 PM.   CSN: [id number]  Arrival date & time 09/26/12  1203   None     Chief Complaint  Patient presents with  . Dental Pain    The history is provided by the patient. No language interpreter was used.    [name] is a 56 y.o. male who presents to the Emergency Department complaining of sudden, gradually worsening, non-radiating, constant right upper dental pain that is attributed to a broken tooth onset 5 days ago. The patient was seen several times in the ED for dental pain in the past and was referred to dentists. he states following up with them for those complaints but does not have a regular dentist. Patient denies fever, chills, facial injuries, nausea, vomiting, and SOB. He is a former smoker and he states some alcohol use.    Past Medical History  Diagnosis Date  . COPD (chronic obstructive pulmonary disease)   . Hypertension   . Asthma   . Shortness of breath     Past Surgical History  Procedure Date  . Colonoscopy   . Irrigation and debridement abscess 06/15/2012    Procedure: IRRIGATION AND DEBRIDEMENT ABSCESS;  Surgeon: Melvenia Beam, MD;  Location: [facility] OR;  Service: ENT;;    Family History  Problem Relation Age of Onset  . Cancer Father     History  Substance Use Topics  . Smoking status: Former Smoker -- 0.2 packs/day for 30 years    Types: Cigarettes    Quit date: 11/23/2011  . Smokeless tobacco: Never Used  . Alcohol Use: Yes     Comment: rare/occasional      Review of Systems  Constitutional: Negative for fever and chills.  HENT: Negative for facial swelling.   Respiratory: Negative for shortness of breath.   Gastrointestinal: Negative for nausea and vomiting.  Neurological: Negative for headaches.    Allergies  Tiotropium bromide monohydrate  Home Medications   Current  Outpatient Rx  Name  Route  Sig  Dispense  Refill  . ALBUTEROL SULFATE HFA 108 (90 BASE) MCG/ACT IN AERS   Inhalation   Inhale 2 puffs into the lungs every 6 (six) hours as needed. For shortness of breath or wheezing         . ALBUTEROL SULFATE (2.5 MG/3ML) 0.083% IN NEBU   Nebulization   Take 2.5 mg by nebulization 4 (four) times daily as needed. For shortness of breath          . AMLODIPINE BESYLATE 10 MG PO TABS   Oral   Take 10 mg by mouth daily.         . ASPIRIN 81 MG PO TBEC   Oral   Take 81 mg by mouth daily.           Marland Kitchen FEXOFENADINE HCL 180 MG PO TABS   Oral   Take 180 mg by mouth daily.          Marland Kitchen FLUTICASONE-SALMETEROL 500-50 MCG/DOSE IN AEPB   Inhalation   Inhale 1 puff into the lungs 2 (two) times daily.           BP 134/95  Pulse 102  Temp 98 F (36.7 C)  Resp 16  SpO2 94%  Physical Exam  Nursing note and vitals reviewed. Constitutional: He appears well-developed and well-nourished. No distress.  HENT:  Head: Normocephalic and atraumatic.  Right Ear: External ear normal.  Left Ear: External ear normal.  Mouth/Throat: Dental caries (widespread ) present.       Dental caries.  No purulent drainage.  No facial swelling.  Eyes: Conjunctivae normal are normal. Right eye exhibits no discharge. Left eye exhibits no discharge. No scleral icterus.  Neck: Neck supple. No tracheal deviation present.  Cardiovascular: Normal rate.   Pulmonary/Chest: Effort normal. No stridor. No respiratory distress.  Musculoskeletal: He exhibits no edema.  Neurological: He is alert. Cranial nerve deficit: no gross deficits.  Skin: Skin is warm and dry. No rash noted.  Psychiatric: He has a normal mood and affect.    ED Course  Procedures (including critical care time)  DIAGNOSTIC STUDIES: Oxygen Saturation is 94% on room air, adequate by my interpretation.    COORDINATION OF CARE: 12:17 PM Discussed treatment plan with pt at bedside and pt agreed to  plan.   Labs Reviewed - No data to display No results found.   1. Dental caries       MDM  Symptoms are consistent with a tooth infection. There are no obvious signs of a severe abscess. He has no significant facial swelling. At this time there does not appear to be any evidence of an emergency medical condition. I will refer him to a dentist/oral surgeon. Patient will be given prescriptions  for pain medications and antibiotics.    I personally performed the services described in this documentation, which was scribed in my presence.  The recorded information has been reviewed and considered.    Celene Kras, MD 09/28/12 [phone]

## 2012-10-24 ENCOUNTER — Encounter: Payer: Self-pay | Admitting: Family Medicine

## 2012-10-24 ENCOUNTER — Ambulatory Visit (INDEPENDENT_AMBULATORY_CARE_PROVIDER_SITE_OTHER): Payer: Medicare Other | Admitting: Family Medicine

## 2012-10-24 VITALS — BP 123/85 | HR 78 | Temp 98.4°F | Ht 69.0 in | Wt 152.0 lb

## 2012-10-24 DIAGNOSIS — I10 Essential (primary) hypertension: Secondary | ICD-10-CM

## 2012-10-24 DIAGNOSIS — J441 Chronic obstructive pulmonary disease with (acute) exacerbation: Secondary | ICD-10-CM | POA: Insufficient documentation

## 2012-10-24 MED ORDER — PREDNISONE 50 MG PO TABS: 50.0000 mg | ORAL_TABLET | Freq: Every day | ORAL | Status: DC

## 2012-10-24 MED ORDER — ALBUTEROL SULFATE HFA 108 (90 BASE) MCG/ACT IN AERS: 2.0000 | INHALATION_SPRAY | RESPIRATORY_TRACT | Status: DC | PRN

## 2012-10-24 MED ORDER — DOXYCYCLINE HYCLATE 100 MG PO TABS: 100.0000 mg | ORAL_TABLET | Freq: Two times a day (BID) | ORAL | Status: DC

## 2012-10-24 NOTE — Progress Notes (Signed)
S: Pt comes in today for follow up.  HYPERTENSION BP: 123/85 Meds: norvasc 10 Taking meds: Yes     # of doses missed/week: 0 Symptoms: Headache: No Dizziness: No Vision changes: No SOB:  Yes : with cold  Chest pain: No LE swelling: No Tobacco use: No   COLD/COPD EXACERBATION  Feels like lungs are "shut down" and can't breath.  Feels like he needs an antibiotic and steroid.  +sneezing, runny nose, and cough.  Can hear himself wheezing.  Started 7-8 days ago.  Thought it was getting better but is still getting worse at night time.  Is needing his albuterol 4-5 times per day for the past few days.  No fevers/chills.  No sinus pressure. No sore throat.  +mucus with cough, doesn't normally have any cough.   Quit smoking x1 year.     ROS: Per HPI  History  Smoking status  . Former Smoker -- 0.2 packs/day for 30 years  . Types: Cigarettes  . Quit date: 11/23/2011  Smokeless tobacco  . Never Used    O:  Filed Vitals:   10/24/12 0908  BP: 123/85  Pulse: 78  Temp: 98.4 F (36.9 C)  O2: 96% RA  Gen: NAD CV: RRR, no murmur Pulm: diffuse inspiratory and expiratory wheezes with good air movement; no increased WOB or accessory muscle use  Ext: Warm, no edema   A/P: 56 y.o. male p/w COPD exacerbation, HTN -See problem list -f/u in 3 months, sooner if not improving

## 2012-10-24 NOTE — Assessment & Plan Note (Signed)
Likely caused by viral URI. Given diffuse wheezing and >1 week time period, will treat with doxy + prednisone x5 days. F/u if not improving.

## 2012-10-24 NOTE — Assessment & Plan Note (Signed)
At goal, continue norvasc 10mg .

## 2012-10-24 NOTE — Patient Instructions (Signed)
It was nice to meet you today.  Your blood pressure looks great, keep taking the norvasc.  I agree that you need an antibiotic and steroid for your COPD exacerbation.  You will take them for 5 days.  Come back if you are not feeing better by early next week.  Otherwise, I will see you back in 3 months to see how everything is going.  Have a great holiday season!

## 2012-11-30 ENCOUNTER — Other Ambulatory Visit: Payer: Self-pay | Admitting: Family Medicine

## 2012-12-31 ENCOUNTER — Other Ambulatory Visit: Payer: Self-pay | Admitting: Family Medicine

## 2013-01-16 ENCOUNTER — Encounter: Payer: Self-pay | Admitting: Family Medicine

## 2013-01-16 ENCOUNTER — Ambulatory Visit (INDEPENDENT_AMBULATORY_CARE_PROVIDER_SITE_OTHER): Payer: Medicare Other | Admitting: Family Medicine

## 2013-01-16 VITALS — BP 142/87 | HR 87 | Temp 98.3°F | Ht 69.0 in | Wt 157.0 lb

## 2013-01-16 DIAGNOSIS — J441 Chronic obstructive pulmonary disease with (acute) exacerbation: Secondary | ICD-10-CM

## 2013-01-16 MED ORDER — DOXYCYCLINE HYCLATE 100 MG PO TABS: 100.0000 mg | ORAL_TABLET | Freq: Two times a day (BID) | ORAL | Status: DC

## 2013-01-16 MED ORDER — PREDNISONE 50 MG PO TABS: 50.0000 mg | ORAL_TABLET | Freq: Every day | ORAL | Status: DC

## 2013-01-16 NOTE — Assessment & Plan Note (Signed)
Likely caused by viral URI.  Will treat with doxy + prednisone x5 days.  Offered neb in clinic but pt declines (O2 sat is 98% on RA)- he will do one when he gets home.  Red flags for return discussed, including s/s for PNA.  F/u 1 month.

## 2013-01-16 NOTE — Patient Instructions (Addendum)
It was good to see you today.  I'm sorry you aren't feeling well!  I sent in an antibiotic and steroid to help with your COPD exacerbation.  If you decide you want to get a prescription nose spray, let me know and I will send it in.  Otherwise, you can use over the counter nasal saline rinses to help with your congestion.  Come back if you aren't feeling better the beginning of next week.  I also want to see you back if you start having fevers or your cough and shortness of breath get worse instead of better.  Otherwise, come back to see me in the next month or so and we can see how you are doing.

## 2013-01-16 NOTE — Progress Notes (Signed)
S: Pt comes in today for SDA for URI/shortness of breath.  Has been having runny nose and sneezing for about 2-3 weeks, then started coughing- + clear phlegm.  Using baby aspirin because it makes him feel better.  Feels short of breath because of all of the coughing.  Knows from past experience if he waits too long it will get to the point where he needs to go the hospital.  Felt like he may have had a fever or 2 a week or so ago.  No further fevers/chills.  No N/V/D.  Did feel like he was going to vomit after coughing.  Uses Albuterol- using the neb every 30-60 minutes (uses this QID when not sick), Advair 500 BID, Allegra.  Forgot to take Advair this AM because he is feeling so poorly, but will take it when he gets home.  Last albuterol neb was around 8am.  Typically does not have cough when he is not sick.    ROS: Per HPI  History  Smoking status  . Former Smoker -- 0.25 packs/day for 30 years  . Types: Cigarettes  . Quit date: 11/23/2011  Smokeless tobacco  . Never Used    O:  Filed Vitals:   01/16/13 1000  BP: 142/87  Pulse: 87  Temp: 98.3 F (36.8 C)  O2: 98% RA  Gen: NAD HEENT: MMM, EOMI, + pharyngeal erythema without exudate, nasal mucosa erythematous, no cervical LAD CV: RRR, no murmur Pulm: diffuse inspiratory and expiratory wheezes, tight air movement throughout, no crackles appreciated    A/P: 57 y.o. male p/w COPD exacerbation -See problem list -f/u in 1 month

## 2013-01-29 ENCOUNTER — Ambulatory Visit (INDEPENDENT_AMBULATORY_CARE_PROVIDER_SITE_OTHER): Payer: Medicare Other | Admitting: Family Medicine

## 2013-01-29 ENCOUNTER — Other Ambulatory Visit: Payer: Self-pay | Admitting: Family Medicine

## 2013-01-29 ENCOUNTER — Encounter: Payer: Self-pay | Admitting: Family Medicine

## 2013-01-29 VITALS — BP 131/76 | HR 87 | Temp 98.6°F | Ht 69.0 in | Wt 154.0 lb

## 2013-01-29 DIAGNOSIS — J441 Chronic obstructive pulmonary disease with (acute) exacerbation: Secondary | ICD-10-CM

## 2013-01-29 MED ORDER — FLUTICASONE-SALMETEROL 500-50 MCG/DOSE IN AEPB: 1.0000 | INHALATION_SPRAY | Freq: Two times a day (BID) | RESPIRATORY_TRACT | Status: DC

## 2013-01-29 MED ORDER — PREDNISONE 20 MG PO TABS: ORAL_TABLET | ORAL | Status: DC

## 2013-01-29 NOTE — Patient Instructions (Addendum)
Re-start Advair  Take prednisone taper 1. 2 tablets for 7 days 2. 1 tablet for 7 days 3. 1/2 tablet for 4 days 4. 1/2 tablet every other day for 4 days (total of 1 tablet total) 5. STOP  Follow-up in 1-2 weeks or sooner if you have worsening symptoms. If you have fevers/chills, worsening cough, or other signs of infection, call and let me know.

## 2013-01-29 NOTE — Progress Notes (Signed)
  Subjective:    Patient ID: [name], male    DOB: [birth year], 57 y.o.   MRN: [id number]  HPI # SDA. COPD exacerbation.  He started coming down with a cold again. He started having runny nose on Thursday or Friday of last week; by Sunday he was "in pretty bad shape" with sneezing, coughing, runny nose. These symptoms interfere with his COPD because he cannot breathe.   He is here to see if he can get antibiotics and prednisone to "get him back on track".   He was seen on 02/26 by PCP with COPD exacerbation and given a course of doxycycline and prednisone. He felt better after taking medications because he felt like he could get phlegm up.   Medications tried: -Albuterol inhaler and nebulizer--6-8 times a day; breaks up and allows him to cough up phlegm; lasts for an hour   He ran out of Advair a week ago   Review of Systems  Allergies, medication, past medical history reviewed.  Smoking status noted.   COPD--on maximum dose Advair; allergic to Spiriva; about 4 attacks a year; he has been hospitalized and intubated in the past; last hospitalized 2013  History of tobacco--over a year ago      Objective:   Physical Exam GEN: NAD; thin; well-nourished, -appearing; slender HEENT:   Head: [place]/AT   Eyes: normal conjunctiva without injection or tearing   Nose: no rhinorrhea, normal turbinates   Mouth: MMM; no tonsillar adenopathy; no oropharyngeal erythema  NECK: no LAD CV: RRR PULM: NI WOB; scatter end expiratory wheezing; no crackles or ronchi    Assessment & Plan:

## 2013-01-29 NOTE — Assessment & Plan Note (Signed)
He was just recently seen and treated for exacerbation 02/26. He had return of dyspnea with cough several days ago, which correlates with around the time he ran out of Advair.  -Restart Advair -Re-start prednisone course with longer taper due to his significant history of COPD with frequent exacerbation/hospitalizations including past intubation and his dyspnea and wheezing on exam today  -Since recently finished course of doxycycline and no systemic symptoms of infection, will hold off on restarting antibiotic at this time; red flags given to follow-up or to call if systemic symptoms -Follow-up in 1-2 weeks or sooner if needed

## 2013-04-10 ENCOUNTER — Other Ambulatory Visit: Payer: Self-pay | Admitting: *Deleted

## 2013-04-10 MED ORDER — ALBUTEROL SULFATE (2.5 MG/3ML) 0.083% IN NEBU: 2.5000 mg | INHALATION_SOLUTION | Freq: Four times a day (QID) | RESPIRATORY_TRACT | Status: DC | PRN

## 2013-04-29 ENCOUNTER — Ambulatory Visit (INDEPENDENT_AMBULATORY_CARE_PROVIDER_SITE_OTHER): Payer: Medicare Other | Admitting: Family Medicine

## 2013-04-29 ENCOUNTER — Encounter: Payer: Self-pay | Admitting: Family Medicine

## 2013-04-29 VITALS — BP 150/89 | HR 69 | Ht 69.0 in | Wt 150.8 lb

## 2013-04-29 DIAGNOSIS — I10 Essential (primary) hypertension: Secondary | ICD-10-CM

## 2013-04-29 DIAGNOSIS — J449 Chronic obstructive pulmonary disease, unspecified: Secondary | ICD-10-CM

## 2013-04-29 MED ORDER — AMLODIPINE BESYLATE 10 MG PO TABS: ORAL_TABLET | ORAL | Status: DC

## 2013-04-29 MED ORDER — ALBUTEROL SULFATE HFA 108 (90 BASE) MCG/ACT IN AERS: 2.0000 | INHALATION_SPRAY | RESPIRATORY_TRACT | Status: DC | PRN

## 2013-04-29 MED ORDER — FLUTICASONE-SALMETEROL 500-50 MCG/DOSE IN AEPB: 1.0000 | INHALATION_SPRAY | Freq: Two times a day (BID) | RESPIRATORY_TRACT | Status: DC

## 2013-04-29 MED ORDER — ALBUTEROL SULFATE (2.5 MG/3ML) 0.083% IN NEBU: 2.5000 mg | INHALATION_SOLUTION | Freq: Four times a day (QID) | RESPIRATORY_TRACT | Status: DC | PRN

## 2013-04-29 NOTE — Assessment & Plan Note (Signed)
Cont advair BID, counseled pt on using albuterol PRN only. Cont allegra.  Lungs clear on exam today despite no albuterol x2 days.

## 2013-04-29 NOTE — Progress Notes (Signed)
S: Pt comes in today for follow up.  HYPERTENSION BP: 150/89 Meds: norvasc 10 Taking meds: Yes     # of doses missed/week: 0 Symptoms: Headache: No Dizziness: No Vision changes: No SOB:  No Chest pain: No LE swelling: No Tobacco use: No   COPD On Advair, allegra, albuterol.  Last exacerbation was 01/2013- treated with prednisone and NO abx (had recently finished doxy for another exacerbation).  Uses advair BID and albuterol neb QID, not PRN.  Is not interested in changing medicines, declines using decreased albuterol.  No home O2 needed.  Able to do chores, yardwork, fishing, etc without SOB or issues.  Does notice increased SOB with no albuterol (ran out 2 days ago).    ROS: Per HPI  History  Smoking status  . Former Smoker -- 0.25 packs/day for 30 years  . Types: Cigarettes  . Quit date: 11/23/2011  Smokeless tobacco  . Never Used    O:  Filed Vitals:   04/29/13 1104  BP: 150/89  Pulse: 69    Gen: NAD CV: RRR, no murmur Pulm: CTA bilat, no wheezes or crackles, no increased WOB Ext: Warm, no edema   A/P: 57 y.o. male p/w COPD, HTN -See problem list -f/u in 1 month

## 2013-04-29 NOTE — Patient Instructions (Signed)
It was good to see you today.  I'm glad you are doing well.  I will send your prescriptions to [facility].  Try to only use the albuterol AS NEEDED.  You don't need to use it every day, just when you're feeling short of breath, or wheezing, or coughing a lot.   Continue the Advair 2 times a day.  Come back in 1-2 months so we can recheck your blood pressure and see how you are doing without using daily albuterol.

## 2013-04-29 NOTE — Assessment & Plan Note (Signed)
Elevated today but usually well controlled.  Cont norvasc 10, recheck in 1 month.

## 2013-05-02 ENCOUNTER — Ambulatory Visit (INDEPENDENT_AMBULATORY_CARE_PROVIDER_SITE_OTHER): Payer: Medicare Other | Admitting: Family Medicine

## 2013-05-02 ENCOUNTER — Encounter: Payer: Self-pay | Admitting: Family Medicine

## 2013-05-02 VITALS — BP 132/79 | HR 103 | Temp 99.9°F | Ht 69.0 in | Wt 150.5 lb

## 2013-05-02 DIAGNOSIS — J441 Chronic obstructive pulmonary disease with (acute) exacerbation: Secondary | ICD-10-CM

## 2013-05-02 MED ORDER — PREDNISONE 50 MG PO TABS: 50.0000 mg | ORAL_TABLET | Freq: Every day | ORAL | Status: DC

## 2013-05-02 MED ORDER — DOXYCYCLINE HYCLATE 100 MG PO TABS: 100.0000 mg | ORAL_TABLET | Freq: Two times a day (BID) | ORAL | Status: DC

## 2013-05-02 MED ORDER — ALBUTEROL SULFATE (2.5 MG/3ML) 0.083% IN NEBU
2.5000 mg | INHALATION_SOLUTION | Freq: Once | RESPIRATORY_TRACT | Status: AC
  Administered 2013-05-02: 2.5 mg via RESPIRATORY_TRACT

## 2013-05-02 NOTE — Progress Notes (Signed)
  Subjective:    Patient ID: [name], male    DOB: 13-Dec-1955, 57 y.o.   MRN: [id number]  URI     57 year old male with history of COPD presents with 2 days of increased dyspnea. Patient was seen in clinic on Monday, but developed his symptoms on Tuesday night. He had been planning some tomatoes outside and became sweaty. He went into a cold air-conditioned room. He noticed increased tightness with breathing, cough, wheezing that was worse at night. His symptoms seem to worsen Wednesday night . He managed to get through the night using albuterol nebulizers every one to 2 hours. He did note some significantly increased clear sputum production with the cough. Patient has been compliant with his Advair. He quit smoking greater than one year ago. No other chemical fume exposures recently.   Has a history of 2 x intubation many years ago.   He has not had any chest pain, fevers, chills, facial pain, purulent rhinorrhea.  Review of Systems See HPI otherwise negative.  reports that he quit smoking about 17 months ago. His smoking use included Cigarettes. He has a 7.5 pack-year smoking history. He has never used smokeless tobacco.     Objective:   Physical Exam  Vitals reviewed. Constitutional: He is oriented to person, place, and time. He appears well-developed and well-nourished. No distress.  Speaks in sentences.   HENT:  Head: Normocephalic and atraumatic.  Nose: Nose normal.  Mouth/Throat: Oropharynx is clear and moist. No oropharyngeal exudate.  Moderate congestion/hoarseness.  Neck: Neck supple. No tracheal deviation present.  Cardiovascular: Regular rhythm and normal heart sounds.   No murmur heard. Slight tachycardia  Pulmonary/Chest: Effort normal. No respiratory distress. He has wheezes. He has no rales.  No accessory muscle use.  Diminished BS in upper fields bilaterally, expiratory wheezing throughout.   After albuterol neb: Improvement in air movement in all fields.  Some residual expiratory wheezing and rhonchi in upper fields, no focal findings.   Lymphadenopathy:    He has no cervical adenopathy.  Neurological: He is alert and oriented to person, place, and time.  Skin: No rash noted. He is not diaphoretic.  Psychiatric: He has a normal mood and affect.          Assessment & Plan:

## 2013-05-02 NOTE — Patient Instructions (Signed)
You have COPD exacerbation.  Start taking prednisone one pill daily for next 5 days. Use albuterol inhaler every 2-4 hours as needed. Make an appointment for check up in next 1-2 days. Go to ER if your symptoms worsen, you have chest pain or cannot catch your breath.   Chronic Obstructive Pulmonary Disease Chronic obstructive pulmonary disease (COPD) is a lung disease. The lungs become damaged. This makes it hard to get air in and out of your lungs. The damage to your lungs cannot be changed. There are things you can do to improve the lungs and make you feel better. HOME CARE  Take all medicines as told by your doctor.  Use medicines that you breathe in (inhale) as told by your doctor.  Avoid medicines or cough syrups that dry up your airway (antihistamines) and do not allow you to get rid of thick spit.  If you smoke, stop.  Avoid being around smoke, chemicals, and fumes that bother your breathing.  Avoid people that have a catchy (contagious) sickness.  Avoid going outside when it is very hot, cold, or humid.  Use humidifiers in your home and near your bedside if it helps your breathing.  Drink enough fluids to keep your pee (urine) clear or pale yellow.  Eat healthy foods. Eat smaller meals more often and rest before meals.  Ask your doctor if it is okay to take vitamins or pills with minerals (supplements).  Exercise and stay active.  Rest with activity.  Get into a comfortable position when you have trouble breathing.  Learn and use tips on how to relax.  Learn and use tips on how to control your breathing as told by your doctor. Try:  Breathing in through your nose for 1 second. Then, breath out (exhale) through your puckered (like a whistle) lips for 2 seconds.  Putting one hand on your belly (abdomen). Breathe in slowly through your nose. Your hand on your belly should move out. Breathe out slowly through your puckered lips. Your hand on your belly should move in  as you breathe out.  Learn and use controlled coughing to clear thick spit from your lungs. 1. Lean your head slightly forward. 2. Breathe in deeply. 3. Try to hold your breath for 3 seconds. 4. Keep your mouth slightly open while coughing 2 times. 5. Spit any thick spit out into a tissue. 6. Rest and do the steps again 1 or 2 times as needed.  Get all shots (vaccines) a told by your doctor.  Learn how to manage stress.  Schedule and go to all follow-up doctor visits.  Go to therapy that can help you improve your lungs (pulmonary rehabilitation) as told by your doctor.  Use oxygen at home as told by your doctor. GET HELP RIGHT AWAY IF:   You can feel your heart beating really fast.  You have shortness of breath while resting.  You have shortness of breath that stops you from being able to talk.  You have shortness of breath that stops you from doing normal activities.  You have chest pain lasting longer than 5 minutes.  You start to shake uncontrollably (seizure).  Your family or friends notice that you are flustered or confused.  You cough up more thick spit than usual.  There is a change in the color or thickness of the spit.  Breathing is more difficult than usual.  Your breathing is faster than usual.  Your skin color is more blueish than usual.  You  are running out of the medicine you take for breathing.  You are anxious, uneasy, fearful, or restless.  You have a fever. MAKE SURE YOU:   Understand these instructions.  Will watch your condition.  Will get help right away if you are not doing well or get worse. Document Released: 04/25/2008 Document Revised: 10/24/2012 Document Reviewed: 01/07/2011 [facility] Patient Information 2014 [place], [place].

## 2013-05-02 NOTE — Assessment & Plan Note (Signed)
2 days of worsened dyspnea, wheezing and sputum production. Albuterol neb treatment in office relieves some symptoms and opens the airways to improved auscultation. Will treat with doxycycline, prednisone x 5 days. Continue home albuterol q2-4 prn. Follow up in clinic tomorrow with high risk history. He understands red flags to seek emergency care sooner.

## 2013-05-04 ENCOUNTER — Other Ambulatory Visit: Payer: Self-pay

## 2013-05-04 ENCOUNTER — Encounter (HOSPITAL_COMMUNITY): Payer: Self-pay | Admitting: Emergency Medicine

## 2013-05-04 ENCOUNTER — Emergency Department (HOSPITAL_COMMUNITY): Payer: Medicare Other

## 2013-05-04 ENCOUNTER — Emergency Department (HOSPITAL_COMMUNITY)
Admission: EM | Admit: 2013-05-04 | Discharge: 2013-05-04 | Disposition: A | Discharge status: Home / Self Care | Payer: Medicare Other | Attending: Emergency Medicine | Admitting: Emergency Medicine

## 2013-05-04 DIAGNOSIS — I10 Essential (primary) hypertension: Secondary | ICD-10-CM | POA: Insufficient documentation

## 2013-05-04 DIAGNOSIS — Z7982 Long term (current) use of aspirin: Secondary | ICD-10-CM | POA: Insufficient documentation

## 2013-05-04 DIAGNOSIS — R059 Cough, unspecified: Secondary | ICD-10-CM | POA: Insufficient documentation

## 2013-05-04 DIAGNOSIS — R05 Cough: Secondary | ICD-10-CM | POA: Insufficient documentation

## 2013-05-04 DIAGNOSIS — Z79899 Other long term (current) drug therapy: Secondary | ICD-10-CM | POA: Insufficient documentation

## 2013-05-04 DIAGNOSIS — Z87891 Personal history of nicotine dependence: Secondary | ICD-10-CM | POA: Insufficient documentation

## 2013-05-04 DIAGNOSIS — J441 Chronic obstructive pulmonary disease with (acute) exacerbation: Secondary | ICD-10-CM | POA: Insufficient documentation

## 2013-05-04 DIAGNOSIS — J45901 Unspecified asthma with (acute) exacerbation: Secondary | ICD-10-CM | POA: Insufficient documentation

## 2013-05-04 DIAGNOSIS — J3489 Other specified disorders of nose and nasal sinuses: Secondary | ICD-10-CM | POA: Insufficient documentation

## 2013-05-04 DIAGNOSIS — IMO0002 Reserved for concepts with insufficient information to code with codable children: Secondary | ICD-10-CM | POA: Insufficient documentation

## 2013-05-04 LAB — BASIC METABOLIC PANEL
CO2: 21 mEq/L (ref 19–32)
Chloride: 105 mEq/L (ref 96–112)
Sodium: 139 mEq/L (ref 135–145)

## 2013-05-04 LAB — CBC
HCT: 40.7 % (ref 39.0–52.0)
MCV: 92.3 fL (ref 78.0–100.0)
RBC: 4.41 MIL/uL (ref 4.22–5.81)
WBC: 11.1 10*3/uL — ABNORMAL HIGH (ref 4.0–10.5)

## 2013-05-04 LAB — POCT I-STAT TROPONIN I

## 2013-05-04 LAB — D-DIMER, QUANTITATIVE: D-Dimer, Quant: <0.27 ug/mL-FEU (ref 0.00–0.48)

## 2013-05-04 MED ORDER — PREDNISONE 50 MG PO TABS: ORAL_TABLET | ORAL | Status: DC

## 2013-05-04 MED ORDER — IPRATROPIUM BROMIDE 0.02 % IN SOLN
0.5000 mg | Freq: Once | RESPIRATORY_TRACT | Status: AC
  Administered 2013-05-04: 0.5 mg via RESPIRATORY_TRACT
  Filled 2013-05-04: qty 2.5

## 2013-05-04 MED ORDER — ALBUTEROL SULFATE (5 MG/ML) 0.5% IN NEBU
5.0000 mg | INHALATION_SOLUTION | Freq: Once | RESPIRATORY_TRACT | Status: AC
  Administered 2013-05-04: 5 mg via RESPIRATORY_TRACT
  Filled 2013-05-04: qty 1

## 2013-05-04 MED ORDER — ALBUTEROL SULFATE (5 MG/ML) 0.5% IN NEBU
INHALATION_SOLUTION | RESPIRATORY_TRACT | Status: AC
  Filled 2013-05-04: qty 0.5

## 2013-05-04 MED ORDER — PREDNISONE 20 MG PO TABS
60.0000 mg | ORAL_TABLET | Freq: Once | ORAL | Status: AC
  Administered 2013-05-04: 60 mg via ORAL
  Filled 2013-05-04: qty 3

## 2013-05-04 MED ORDER — ALBUTEROL SULFATE (5 MG/ML) 0.5% IN NEBU: 5.0000 mg | INHALATION_SOLUTION | Freq: Once | RESPIRATORY_TRACT | Status: DC

## 2013-05-04 MED ORDER — IPRATROPIUM BROMIDE 0.02 % IN SOLN
RESPIRATORY_TRACT | Status: AC
  Filled 2013-05-04: qty 2.5

## 2013-05-04 NOTE — ED Notes (Signed)
Pt ambulated in hallway with this RN O2 sats 97% before, during, and after walk.

## 2013-05-04 NOTE — ED Provider Notes (Signed)
History     CSN: [id number]  Arrival date & time 05/04/13  0946   First MD Initiated Contact with Patient 05/04/13 1028      Chief Complaint  Patient presents with  . Shortness of Breath    (Consider location/radiation/quality/duration/timing/severity/associated sxs/prior treatment) HPI Comments: Patient presents with a 5 day history of difficulty breathing, coughing and wheezing. He saw his PCP 2 days ago and is placed on steroids and doxycycline for COPD exacerbation. He is gone through 5 days worth of steroids already. He reports difficulty breathing with nonproductive cough and wheezing. He is using his nebulizer 4-5 times daily. He states he had to take the steroids early because they make him feel better. Denies any fevers, chest pain, nausea, vomiting or abdominal pain. No leg pain or swelling. No difficulty lying flat. Stop smoking 2013.   The history is provided by the patient.    Past Medical History  Diagnosis Date  . COPD (chronic obstructive pulmonary disease)   . Hypertension   . Asthma   . Shortness of breath     Past Surgical History  Procedure Laterality Date  . Colonoscopy    . Irrigation and debridement abscess  06/15/2012    Procedure: IRRIGATION AND DEBRIDEMENT ABSCESS;  Surgeon: Melvenia Beam, MD;  Location: [facility] OR;  Service: ENT;;    Family History  Problem Relation Age of Onset  . Cancer Father     History  Substance Use Topics  . Smoking status: Former Smoker -- 0.25 packs/day for 30 years    Types: Cigarettes    Quit date: 11/23/2011  . Smokeless tobacco: Never Used  . Alcohol Use: Yes     Comment: rare/occasional      Review of Systems  Constitutional: Negative for fever, activity change and appetite change.  HENT: Positive for congestion and rhinorrhea.   Eyes: Negative for visual disturbance.  Respiratory: Positive for cough, chest tightness and shortness of breath.   Cardiovascular: Negative for chest pain.  Gastrointestinal:  Negative for nausea, vomiting and abdominal pain.  Genitourinary: Negative for dysuria and hematuria.  Musculoskeletal: Negative for back pain.  Skin: Negative for rash.  Neurological: Negative for dizziness, weakness and headaches.  A complete 10 system review of systems was obtained and all systems are negative except as noted in the HPI and PMH.    Allergies  Tiotropium bromide monohydrate  Home Medications   Current Outpatient Rx  Name  Route  Sig  Dispense  Refill  . albuterol (PROAIR HFA) 108 (90 BASE) MCG/ACT inhaler   Inhalation   Inhale 2 puffs into the lungs every 4 (four) hours as needed for wheezing.   1 Inhaler   11   . albuterol (PROVENTIL) (2.5 MG/3ML) 0.083% nebulizer solution   Nebulization   Take 3 mLs (2.5 mg total) by nebulization 4 (four) times daily as needed. For shortness of breath   75 mL   2     Needs appt with PCP for further refills   . amLODipine (NORVASC) 10 MG tablet   Oral   Take 10 mg by mouth daily.         Marland Kitchen aspirin 81 MG EC tablet   Oral   Take 81 mg by mouth daily.           Marland Kitchen doxycycline (VIBRA-TABS) 100 MG tablet   Oral   Take 1 tablet (100 mg total) by mouth 2 (two) times daily.   14 tablet   0   .  fexofenadine (ALLEGRA) 180 MG tablet   Oral   Take 180 mg by mouth daily.          . Fluticasone-Salmeterol (ADVAIR) 500-50 MCG/DOSE AEPB   Inhalation   Inhale 1 puff into the lungs 2 (two) times daily.   60 each   3   . predniSONE (DELTASONE) 50 MG tablet   Oral   Take 1 tablet (50 mg total) by mouth daily.   5 tablet   0   . predniSONE (DELTASONE) 50 MG tablet      1 tablet PO daily   5 tablet   0     BP 147/82  Pulse 88  Temp(Src) 98.3 F (36.8 C) (Oral)  Resp 20  SpO2 97%  Physical Exam  Constitutional: He is oriented to person, place, and time. He appears well-developed and well-nourished.  No distress, speaking in full senses, prolonged expiratory phase  HENT:  Head: Normocephalic and  atraumatic.  Mouth/Throat: Oropharynx is clear and moist. No oropharyngeal exudate.  Eyes: Conjunctivae and EOM are normal. Pupils are equal, round, and reactive to light.  Neck: Normal range of motion. Neck supple.  Cardiovascular: Normal rate, regular rhythm and normal heart sounds.   No murmur heard. Pulmonary/Chest: Effort normal. No respiratory distress. He has wheezes.  Diminished sounds in the upper fields, diffuse expiratory wheezing throughout  Abdominal: Soft. There is no tenderness. There is no rebound and no guarding.  Musculoskeletal: Normal range of motion. He exhibits no edema and no tenderness.  Neurological: He is alert and oriented to person, place, and time. No cranial nerve deficit. He exhibits normal muscle tone. Coordination normal.  Skin: Skin is warm.    ED Course  Procedures (including critical care time)  Labs Reviewed  BASIC METABOLIC PANEL - Abnormal; Notable for the following:    Glucose, Bld 111 (*)    GFR calc non Af Amer 75 (*)    GFR calc Af Amer 87 (*)    All other components within normal limits  CBC - Abnormal; Notable for the following:    WBC 11.1 (*)    All other components within normal limits  TROPONIN I  PRO B NATRIURETIC PEPTIDE  D-DIMER, QUANTITATIVE  POCT I-STAT TROPONIN I   Dg Chest 2 View (if Patient Has Fever And/or Copd)  05/04/2013   *RADIOLOGY REPORT*  Clinical Data: Shortness of breath  CHEST - 2 VIEW  Comparison: 11/24/2011  Findings: Cardiomediastinal silhouette is stable.  No acute infiltrate or pleural effusion.  No pulmonary edema.  Bony thorax is unremarkable.  IMPRESSION: No active disease.  No significant change.   Original Report Authenticated By: Natasha Mead, M.D.     1. COPD exacerbation       MDM  5 day history of cough, shortness of breath. Cough is nonproductive. No chest pain or fever.  Patient wheezing on exam. He is given nebulizers and steroids. Chest x-ray shows no pneumonia. All lab studies are at  baseline. Troponin negative. D-dimer negative. EKG is unchanged. Presentation is atypical for ACS or PE.  After 2 nebulizers, patient's air exchange improved though he still had some coarse breath sounds and wheezing. He was able to ambulate and maintain saturations greater than 96%. He states he feels better and wishes to go home. He declines admission for observation. Will extend steroid course. He is advised to take his PO steroids only as prescribed. He is already on doxycycline. He states he has enough refills of his inhaler and nebulizer.  Date: 05/04/2013  Rate: 93  Rhythm: normal sinus rhythm  QRS Axis: normal  Intervals: normal  ST/T Wave abnormalities: normal  Conduction Disutrbances:none  Narrative Interpretation:   Old EKG Reviewed: unchanged    Glynn Octave, MD 05/04/13 1756

## 2013-05-04 NOTE — ED Notes (Signed)
Pt c/o COPD onset Monday night. Pt seen by PMD on Thursday and given steroids. Pt reports symptoms not improving. Pt has non productive cough.

## 2013-05-04 NOTE — ED Notes (Signed)
Pt discharged to home with family. NAD.  

## 2013-05-08 ENCOUNTER — Encounter: Payer: Self-pay | Admitting: Family Medicine

## 2013-05-08 ENCOUNTER — Ambulatory Visit (INDEPENDENT_AMBULATORY_CARE_PROVIDER_SITE_OTHER): Payer: Medicare Other | Admitting: Family Medicine

## 2013-05-08 VITALS — BP 151/95 | HR 90 | Ht 69.0 in | Wt 149.0 lb

## 2013-05-08 DIAGNOSIS — J441 Chronic obstructive pulmonary disease with (acute) exacerbation: Secondary | ICD-10-CM

## 2013-05-08 MED ORDER — PREDNISONE 10 MG PO TABS: ORAL_TABLET | ORAL | Status: DC

## 2013-05-08 NOTE — Progress Notes (Signed)
S: Pt comes in today for follow up.  He was seen in clinic on 6/12 and started on doxycyline and prednisone for COPD exacerbation.  He took 4 of the 5 50mg  prednisone tabs on the 12th/13th and took his last tab on the 14th.  He then went to the ER on 6/14 for continued SOB/couldn't catch his breath and cough and prednisone course was extended.  He has 1 more 50mg  tab left.  He denies SOB currently but continues to have productive cough with green sputum.  He denies any fevers or chills and had an unremarkable CXR in the ER 4 days ago.  He has been using his Advair BID and albuterol neb 2-3 times per day-- this is his typical use, whether or not he has an exacerbation (uses his albuterol as a controller medication despite continued counseling on only using PRN).  He is still working out side in the heat and garden, as reported by his wife, and is still sneaking some cigarettes.   ROS: Per HPI  History  Smoking status  . Current Some Day Smoker -- 0.25 packs/day for 30 years  . Types: Cigarettes  . Last Attempt to Quit: 11/23/2011  Smokeless tobacco  . Never Used    O:  Filed Vitals:   05/08/13 0857  BP: 151/95  Pulse: 90  O2: 94% RA  Gen: NAD, appears comfortable, able to talk in full sentences CV: RRR, no murmur Pulm: no increased WOB or accessory muscle use, moving air throughout but diffuse inspiratory and expiratory wheezes, diffuse rhonchi  Ext: Warm,  no edema   A/P: 57 y.o. male p/w continued COPD exacerbation -See problem list -f/u in 10 days

## 2013-05-08 NOTE — Patient Instructions (Addendum)
It was good to see you today.  I'm sorry you're still not feeling great.  Finish your doxycyline.  I am going to taper your prednisone down over the next 5 days to see if this helps a little bit more.   TAPER INSTRUCTIONS: Take your 50mg  tablet tomorrow (Thursday). Take 5 of the new tablets on Friday, Saturday, and Sunday. Take 4 tablets on Monday and Tuesday. Take 3 tablets on Wednesday and Thursday. Take 2 tablets on Friday and Saturday. Take 1 tablet on Sunday.   If you would like, you can plan on coming back on Monday 6/30 or sometime that week.  If at any point you feel like you are getting worse, your cough is worse, or you start having fevers/chills, please come back.

## 2013-05-08 NOTE — Assessment & Plan Note (Signed)
Continues to have symptoms despite 6 days of prednisone, almost completed doxy course.  Will give 10 more days of steroid with slow taper.  Continue Advair BID and albuterol PRN.  Advised total smoking cessation and avoiding outdoors for the next few days.  Encouraged pharmacy clinic f/u in July to see if we can optimize his medications better.  He is allergic to Spiriva.  Advised pt to make appt for 6/30, day after his taper ends, to ensure he is doing well.  Red flags discussed.

## 2013-05-17 ENCOUNTER — Ambulatory Visit: Payer: Medicare Other | Admitting: Family Medicine

## 2013-05-20 ENCOUNTER — Other Ambulatory Visit: Payer: Self-pay | Admitting: Family Medicine

## 2013-05-24 ENCOUNTER — Encounter (HOSPITAL_COMMUNITY): Payer: Self-pay | Admitting: *Deleted

## 2013-05-24 DIAGNOSIS — Z79899 Other long term (current) drug therapy: Secondary | ICD-10-CM | POA: Insufficient documentation

## 2013-05-24 DIAGNOSIS — I10 Essential (primary) hypertension: Secondary | ICD-10-CM | POA: Insufficient documentation

## 2013-05-24 DIAGNOSIS — K047 Periapical abscess without sinus: Secondary | ICD-10-CM | POA: Insufficient documentation

## 2013-05-24 DIAGNOSIS — F17200 Nicotine dependence, unspecified, uncomplicated: Secondary | ICD-10-CM | POA: Insufficient documentation

## 2013-05-24 DIAGNOSIS — J441 Chronic obstructive pulmonary disease with (acute) exacerbation: Secondary | ICD-10-CM | POA: Insufficient documentation

## 2013-05-24 DIAGNOSIS — Z7982 Long term (current) use of aspirin: Secondary | ICD-10-CM | POA: Insufficient documentation

## 2013-05-24 NOTE — ED Notes (Addendum)
C/o lower left tooth pain, mentions broken tooth, tylenol no longer helping, onset yesterday, denies sx other than pain. Last tylenol at 2215.

## 2013-05-25 ENCOUNTER — Emergency Department (HOSPITAL_COMMUNITY)
Admission: EM | Admit: 2013-05-25 | Discharge: 2013-05-25 | Disposition: A | Discharge status: Home / Self Care | Payer: Medicare Other | Attending: Emergency Medicine | Admitting: Emergency Medicine

## 2013-05-25 DIAGNOSIS — K047 Periapical abscess without sinus: Secondary | ICD-10-CM

## 2013-05-25 MED ORDER — HYDROCODONE-ACETAMINOPHEN 5-325 MG PO TABS: 2.0000 | ORAL_TABLET | ORAL | Status: DC | PRN

## 2013-05-25 MED ORDER — AMOXICILLIN-POT CLAVULANATE 875-125 MG PO TABS: 1.0000 | ORAL_TABLET | Freq: Two times a day (BID) | ORAL | Status: DC

## 2013-05-25 NOTE — ED Notes (Signed)
Patient c/o left lower teeth pain. Pt states that he believes he has a broken tooth. Pt states that he has been taking tylenol the past couple of days and it is no longer helping. No swelling noted to face/cheek. Denies injury/trauma to teeth/face.

## 2013-05-25 NOTE — ED Provider Notes (Signed)
History    CSN: [id number] Arrival date & time 05/24/13  2330  First MD Initiated Contact with Patient 05/25/13 0018     Chief Complaint  Patient presents with  . Dental Pain   (Consider location/radiation/quality/duration/timing/severity/associated sxs/prior Treatment) HPI Comments: Patient presents to the ER for evaluation of toothache. Patient reports that he has a tooth in the left lower side of his mouth is broken off. It has progressively become more painful over a period of approximately one day. Initially Tylenol is helping, now it is no longer. Patient reports swelling but no drainage. He has not had any fever.  Patient is a 57 y.o. male presenting with tooth pain.  Dental Pain Associated symptoms: no fever    Past Medical History  Diagnosis Date  . COPD (chronic obstructive pulmonary disease)   . Hypertension   . Asthma   . Shortness of breath    Past Surgical History  Procedure Laterality Date  . Colonoscopy    . Irrigation and debridement abscess  06/15/2012    Procedure: IRRIGATION AND DEBRIDEMENT ABSCESS;  Surgeon: Melvenia Beam, MD;  Location: [facility] OR;  Service: ENT;;   Family History  Problem Relation Age of Onset  . Cancer Father    History  Substance Use Topics  . Smoking status: Current Some Day Smoker -- 0.25 packs/day for 30 years    Types: Cigarettes    Last Attempt to Quit: 11/23/2011  . Smokeless tobacco: Never Used  . Alcohol Use: Yes     Comment: rare/occasional    Review of Systems  Constitutional: Negative for fever.  HENT: Positive for dental problem.     Allergies  Tiotropium bromide monohydrate  Home Medications   Current Outpatient Rx  Name  Route  Sig  Dispense  Refill  . albuterol (PROAIR HFA) 108 (90 BASE) MCG/ACT inhaler   Inhalation   Inhale 2 puffs into the lungs every 4 (four) hours as needed for wheezing.   1 Inhaler   11   . albuterol (PROVENTIL) (2.5 MG/3ML) 0.083% nebulizer solution      TAKE 3 MLS (2.5 MG  TOTAL) BY NEBULIZATION 4 (FOUR) TIMES DAILY AS NEEDED. FOR SHORTNESS OF BREATH   75 mL   0   . amLODipine (NORVASC) 10 MG tablet   Oral   Take 10 mg by mouth daily.         Marland Kitchen aspirin 81 MG EC tablet   Oral   Take 81 mg by mouth daily.           Marland Kitchen doxycycline (VIBRA-TABS) 100 MG tablet   Oral   Take 1 tablet (100 mg total) by mouth 2 (two) times daily.   14 tablet   0   . fexofenadine (ALLEGRA) 180 MG tablet   Oral   Take 180 mg by mouth daily.          . Fluticasone-Salmeterol (ADVAIR) 500-50 MCG/DOSE AEPB   Inhalation   Inhale 1 puff into the lungs 2 (two) times daily.   60 each   3   . predniSONE (DELTASONE) 10 MG tablet      Taper as instructed   34 tablet   0    BP 160/92  Pulse 82  Temp(Src) 98.7 F (37.1 C) (Oral)  Resp 18  SpO2 99% Physical Exam  Constitutional: He is oriented to person, place, and time. He appears well-developed and well-nourished. No distress.  HENT:  Head: Normocephalic and atraumatic.  Right Ear: Hearing normal.  Left Ear: Hearing normal.  Nose: Nose normal.  Mouth/Throat: Oropharynx is clear and moist and mucous membranes are normal.    Eyes: Conjunctivae and EOM are normal. Pupils are equal, round, and reactive to light.  Neck: Normal range of motion. Neck supple.  Cardiovascular: Regular rhythm, S1 normal and S2 normal.  Exam reveals no gallop and no friction rub.   No murmur heard. Pulmonary/Chest: Effort normal and breath sounds normal. No respiratory distress. He exhibits no tenderness.  Abdominal: Soft. Normal appearance and bowel sounds are normal. There is no hepatosplenomegaly. There is no tenderness. There is no rebound, no guarding, no tenderness at McBurney's point and negative Murphy's sign. No hernia.  Musculoskeletal: Normal range of motion.  Neurological: He is alert and oriented to person, place, and time. He has normal strength. No cranial nerve deficit or sensory deficit. Coordination normal. GCS eye  subscore is 4. GCS verbal subscore is 5. GCS motor subscore is 6.  Skin: Skin is warm, dry and intact. No rash noted. No cyanosis.  Psychiatric: He has a normal mood and affect. His speech is normal and behavior is normal. Thought content normal.    ED Course  Procedures (including critical care time) Labs Reviewed - No data to display No results found. Diagnosis: Dental pain, early dental abscess  MDM  This presents with toothache with swelling of the gums around 80 EKG fractured tooth. Patient examination to test with very early dental abscess. Treat with Augmentin and Vicodin  Gilda Crease, MD 05/25/13 0030

## 2013-05-31 ENCOUNTER — Other Ambulatory Visit: Payer: Self-pay | Admitting: Family Medicine

## 2013-06-20 ENCOUNTER — Other Ambulatory Visit: Payer: Self-pay | Admitting: Family Medicine

## 2013-07-02 ENCOUNTER — Other Ambulatory Visit: Payer: Self-pay | Admitting: Family Medicine

## 2013-07-03 NOTE — Telephone Encounter (Signed)
Patient is asking for a month supply of this medication.  He last got the refill on 7/31, but it only lasted him for a week or so, so her really would like enough to last him a month.

## 2013-07-03 NOTE — Telephone Encounter (Signed)
Will forward to Dr. Adamo.  

## 2013-10-02 ENCOUNTER — Other Ambulatory Visit: Payer: Self-pay | Admitting: Family Medicine

## 2013-10-02 MED ORDER — ALBUTEROL SULFATE (2.5 MG/3ML) 0.083% IN NEBU: INHALATION_SOLUTION | RESPIRATORY_TRACT | Status: DC

## 2013-11-04 ENCOUNTER — Other Ambulatory Visit: Payer: Self-pay | Admitting: Family Medicine

## 2013-11-04 NOTE — Telephone Encounter (Signed)
LM for pt informing him that his "rx was sent to pharmacy". Jazmin Hartsell,CMA  

## 2013-12-27 ENCOUNTER — Encounter: Payer: Self-pay | Admitting: Family Medicine

## 2013-12-27 ENCOUNTER — Ambulatory Visit (INDEPENDENT_AMBULATORY_CARE_PROVIDER_SITE_OTHER): Payer: Medicare HMO | Admitting: Family Medicine

## 2013-12-27 VITALS — BP 140/82 | HR 84 | Temp 98.0°F | Ht 69.0 in | Wt 158.0 lb

## 2013-12-27 DIAGNOSIS — J449 Chronic obstructive pulmonary disease, unspecified: Secondary | ICD-10-CM

## 2013-12-27 DIAGNOSIS — J441 Chronic obstructive pulmonary disease with (acute) exacerbation: Secondary | ICD-10-CM

## 2013-12-27 MED ORDER — ALBUTEROL SULFATE HFA 108 (90 BASE) MCG/ACT IN AERS: 2.0000 | INHALATION_SPRAY | RESPIRATORY_TRACT | Status: DC | PRN

## 2013-12-27 MED ORDER — DOXYCYCLINE HYCLATE 50 MG PO CAPS: 50.0000 mg | ORAL_CAPSULE | Freq: Two times a day (BID) | ORAL | Status: AC

## 2013-12-27 MED ORDER — PREDNISONE 50 MG PO TABS: 50.0000 mg | ORAL_TABLET | Freq: Every day | ORAL | Status: DC

## 2013-12-27 MED ORDER — AMLODIPINE BESYLATE 10 MG PO TABS: 10.0000 mg | ORAL_TABLET | Freq: Every day | ORAL | Status: DC

## 2013-12-27 MED ORDER — ALBUTEROL SULFATE (2.5 MG/3ML) 0.083% IN NEBU: INHALATION_SOLUTION | RESPIRATORY_TRACT | Status: DC

## 2013-12-27 MED ORDER — FLUTICASONE-SALMETEROL 500-50 MCG/DOSE IN AEPB: 1.0000 | INHALATION_SPRAY | Freq: Two times a day (BID) | RESPIRATORY_TRACT | Status: DC

## 2013-12-27 NOTE — Assessment & Plan Note (Signed)
A: mild exacerbation. P: For mild COPD exacerbation: Doxycyclin and prednisone  Call and come back for chest pain, fever, worsening shortness of breath.

## 2013-12-27 NOTE — Progress Notes (Signed)
   Subjective:    Patient ID: [name], male    DOB: 1956-08-24, 58 y.o.   MRN: [id number]  HPI 58 yo M with COPD presents for SD visit:  1. SOB: x 4 days with mild productive cough and chest tightness. No fever, chills, night sweats. Symptoms are worse when resting a night. Nephew is a sick contact.   Soc Hx: quit smoking 6 months ago.  Review of Systems As per HPI    Objective:   Physical Exam BP 140/82  Pulse 84  Temp(Src) 98 F (36.7 C) (Oral)  Ht 5\' 9"  (1.753 m)  Wt 158 lb (71.668 kg)  BMI 23.32 kg/m2  SpO2 95% General appearance: alert, cooperative and no distress Neck: no adenopathy, no carotid bruit, no JVD, supple, symmetrical, trachea midline and thyroid not enlarged, symmetric, no tenderness/mass/nodules Lungs: clear to auscultation bilaterally Heart: regular rate and rhythm, S1, S2 normal, no murmur, click, rub or gallop Extremities: extremities normal, atraumatic, no cyanosis or edema       Assessment & Plan:

## 2013-12-27 NOTE — Patient Instructions (Signed)
[name],  Thank you for coming in today. Great job with smoking cessation! I have refilled medications.  For mild COPD exacerbation: Antibiotic and prednisone  Call and come back for chest pain, fever, worsening shortness of breath.  Dr. Adrian Blackwater

## 2014-01-03 ENCOUNTER — Other Ambulatory Visit: Payer: Self-pay | Admitting: Family Medicine

## 2014-01-28 ENCOUNTER — Ambulatory Visit: Payer: Medicare HMO | Admitting: Family Medicine

## 2014-01-29 ENCOUNTER — Ambulatory Visit (INDEPENDENT_AMBULATORY_CARE_PROVIDER_SITE_OTHER): Payer: Medicare HMO | Admitting: Family Medicine

## 2014-01-29 ENCOUNTER — Encounter: Payer: Self-pay | Admitting: Family Medicine

## 2014-01-29 VITALS — BP 118/80 | HR 82 | Temp 97.0°F | Ht 69.0 in | Wt 157.0 lb

## 2014-01-29 DIAGNOSIS — R42 Dizziness and giddiness: Secondary | ICD-10-CM

## 2014-01-29 DIAGNOSIS — J441 Chronic obstructive pulmonary disease with (acute) exacerbation: Secondary | ICD-10-CM

## 2014-01-29 MED ORDER — DOXYCYCLINE HYCLATE 100 MG PO TABS: 100.0000 mg | ORAL_TABLET | Freq: Two times a day (BID) | ORAL | Status: DC

## 2014-01-29 MED ORDER — FLUTICASONE PROPIONATE 50 MCG/ACT NA SUSP: 2.0000 | Freq: Every day | NASAL | Status: DC

## 2014-01-29 MED ORDER — PREDNISONE 50 MG PO TABS: 50.0000 mg | ORAL_TABLET | Freq: Every day | ORAL | Status: DC

## 2014-01-29 NOTE — Assessment & Plan Note (Addendum)
Normal orthostatics. Normal dix hallpike. Normal O2 on ra. Normal neuro exam without focal findings, making stroke unlikely.  This could be related to his sinus congestion. It could also be related to his copd exacerbation. Not likely cardiac, since no chest pain or palpitations.  - treat congestion with flonase. Start back allegra but if dizzy, stop.  - if headache, worsening dizziness, weakness, return to care.  - follow up in 2 weeks.

## 2014-01-29 NOTE — Patient Instructions (Signed)
Please make an appointment with the pharmacy clinic to get pulmonary function tests done.   We are going to treat you for a COPD exacerbation with prednisone and antibiotics.  Follow up with Dr. Sherril Cong in 2 weeks after you get the pulmonary function tests done.   For the dizziness, it could be related to your allergies. Let's try flonase and see how you do. If the dizziness gets worst, if you have a headache, if you get nauseous or throw up, come back to the clinic.

## 2014-01-29 NOTE — Progress Notes (Signed)
Patient ID: CHEICK SUHR    DOB: 1956-02-13, 58 y.o.   MRN: [id number] --- Subjective:  Garvey is a 58 y.o.male with h/o COPD and HTN who presents for shortness of breath and dizziness.  - shortness of breath: has been getting more short winded in the last 4-5 days. He has had an intermittent cough. He has been using albuterol nebulizer daily with only mild relief. He has also been compliant with advair, taking it twice daily.   - dizziness: started Sunday. Better today. Associated with getting up quickly. Feels like room is spinning. No headache. No change in vision. No tinnitus. No nausea, no vomiting. He took Ec[employer] and thought it could be related. Stopped taking it and is feeling better today. He has taken allegra for several years before. He also took 2 ASA this am. He denies any chest pain, heart palpitations or chest tightness.   ROS: see HPI Past Medical History: reviewed and updated medications and allergies. Social History: Tobacco: former smoker. Not currently smoking.   Objective: Filed Vitals:   01/29/14 1620  BP: 118/80  Pulse: 82  Temp:    O2: 97% on room air.   Physical Examination:   General appearance - alert, well appearing, and in no distress Ears - bilateral TM's and external ear canals normal Nose - very congested and erythematous nasal turbinates bilaterally Mouth - mucous membranes moist, pharynx normal without lesions, erythematous posterior oropharynx.  Chest - decreased breath sounds bilaterally, no crackles or wheezing, patient speaks in full sentences but through pursed lips and does appear dyspneic on exam.  Heart - normal rate, regular rhythm, normal S1, S2, no murmurs, rubs, clicks or gallops Extremities - no edema Neuro - CN2-12 grossly intact, normal finger to nose, negative [employer].

## 2014-01-29 NOTE — Assessment & Plan Note (Addendum)
With worsening dyspnea, this could likely be exacerbation. Reviewing chart it looks like he was treated for exacerbation 1 month ago.  - prednisone 50mg  x5 days - doxycycline x10 days - continue albuterol and advair. Patient cannot take spiriva due to hallucinations he had on it. Therefore did not administer duonebs in clinic today.  - after treatment, appointment for PFT's since he has not had them done in many years - follow up after PFT's with Dr. Sherril Cong, his PCP.  - return to care if worst.

## 2014-02-06 ENCOUNTER — Encounter: Payer: Self-pay | Admitting: Pharmacist

## 2014-02-06 ENCOUNTER — Ambulatory Visit (INDEPENDENT_AMBULATORY_CARE_PROVIDER_SITE_OTHER): Payer: Medicare HMO | Admitting: Pharmacist

## 2014-02-06 VITALS — BP 151/87 | HR 76 | Ht 69.0 in | Wt 160.1 lb

## 2014-02-06 DIAGNOSIS — Z87891 Personal history of nicotine dependence: Secondary | ICD-10-CM | POA: Insufficient documentation

## 2014-02-06 DIAGNOSIS — J449 Chronic obstructive pulmonary disease, unspecified: Secondary | ICD-10-CM

## 2014-02-06 NOTE — Progress Notes (Signed)
S:    Patient arrives in good spirits but nervous about PFTs. Presents for lung function evaluation. He states that the last time he had a PFT in the hospital, he felt "humiliated" since he could not catch his breath and had a runny nose afterwards. He states that he was told his COPD was the worst case they had seen.  Reports using albuterol nebulization treatment this morning at around 5:30 am and taking his Advair this morning as well.  Patient reports breathing has been much better and has not had any dizziness since treated for exacerbation on 3/11 with prednisone x 5 days and doxycycline x 10 days (last dose on 3/20). He states he takes albuterol nebulizer treatments every morning and sometimes in the evening but has not needed to use his albuterol MDI for the last few days.  Patient quit smoking last year.  He stated he smoked for at least 30 years.    O: See "scanned report" or Documentation Flowsheet (discrete results - PFTs) for  Spirometry results. Patient provided good effort while attempting spirometry.   Albuterol Neb  Lot# [id number]     Exp. 08/2015  A/P: Patient with known COPD found to have spirometry evaluation revealing FEV1 of 76% after taking albuterol and advair this morning.  His post-nebulized albuterol treatment revealed no significant change (FEV1 of 79%), likely secondary to having albuterol and advair in his system.  These readings are also likely significantly improved from baseline.  Patient has been experiencing shortness of breath for several years and taking advair, albuterol nebs, and allerga. no changes were made to his treatment plan at this time as he states he is significantly improved.  Could consider trial with different inhaled steriod if progresses or has repeated exacerbations.  Would consider getting repeat PFTs in the future while off medications.  Congratulated him on quitting smoking. Technique was reviewed with the patient as he was tipping the advair container  over before inhaling.  He was instructed to hold the device like a hamburger and demonstrated proper technique in the office.  Reviewed results of pulmonary function tests.  Pt verbalized understanding of results and education.  Written pt instructions provided.  F/U Clinic visit with PCP.   Total time in face to face counseling 25 minutes.  Patient seen with Terrilyn Saver, PharmD Resident, and Vivia Ewing, PhamD Resident.

## 2014-02-06 NOTE — Assessment & Plan Note (Signed)
Quit smoking ~ 6 months prior - encouraged continued abstinence.

## 2014-02-06 NOTE — Assessment & Plan Note (Addendum)
Patient with known COPD found to have spirometry evaluation revealing FEV1 of 76% after taking albuterol and advair this morning.  His post-nebulized albuterol treatment revealed no significant change (FEV1 of 79%), likely secondary to having albuterol and advair in his system.  These readings are also likely significantly improved from baseline.  Patient has been experiencing shortness of breath for several years and taking advair, albuterol nebs, and allerga. no changes were made to his treatment plan at this time as he states he is significantly improved.  Could consider trial with different inhaled steriod if progresses or has repeated exacerbations.  Would consider getting repeat PFTs in the future while off medications.  Congratulated him on quitting smoking. Technique was reviewed with the patient as he was tipping the advair container over before inhaling.  He was instructed to hold the device like a hamburger and demonstrated proper technique in the office.  Reviewed results of pulmonary function tests.  Pt verbalized understanding of results and education.  Written pt instructions provided.  F/U Clinic visit with PCP.   Total time in face to face counseling 25 minutes.  Patient seen with Terrilyn Saver, PharmD Resident, and Vivia Ewing, PhamD Resident.

## 2014-02-06 NOTE — Patient Instructions (Signed)
Thank you for coming to see [name] today!  We will not make any adjustments to your medication regimen today.  Please keep your appointment with Dr. Sherril Cong.

## 2014-02-06 NOTE — Progress Notes (Signed)
Patient ID: [name], male   DOB: 1955-12-15, 58 y.o.   MRN: [id number] Reviewed: Agree with Dr. Graylin Shiver documentation and management.

## 2014-02-24 ENCOUNTER — Other Ambulatory Visit: Payer: Self-pay | Admitting: Family Medicine

## 2014-02-26 ENCOUNTER — Ambulatory Visit (INDEPENDENT_AMBULATORY_CARE_PROVIDER_SITE_OTHER): Payer: Commercial Managed Care - HMO | Admitting: Family Medicine

## 2014-02-26 ENCOUNTER — Encounter: Payer: Self-pay | Admitting: Family Medicine

## 2014-02-26 VITALS — BP 150/86 | HR 76 | Temp 98.6°F | Wt 154.0 lb

## 2014-02-26 DIAGNOSIS — I10 Essential (primary) hypertension: Secondary | ICD-10-CM

## 2014-02-26 DIAGNOSIS — J4489 Other specified chronic obstructive pulmonary disease: Secondary | ICD-10-CM

## 2014-02-26 DIAGNOSIS — J449 Chronic obstructive pulmonary disease, unspecified: Secondary | ICD-10-CM

## 2014-02-26 DIAGNOSIS — F528 Other sexual dysfunction not due to a substance or known physiological condition: Secondary | ICD-10-CM

## 2014-02-26 MED ORDER — ALBUTEROL SULFATE HFA 108 (90 BASE) MCG/ACT IN AERS: 2.0000 | INHALATION_SPRAY | RESPIRATORY_TRACT | Status: DC | PRN

## 2014-02-26 MED ORDER — HYDROCHLOROTHIAZIDE 25 MG PO TABS: 25.0000 mg | ORAL_TABLET | Freq: Every day | ORAL | Status: DC

## 2014-02-26 MED ORDER — FLUTICASONE-SALMETEROL 500-50 MCG/DOSE IN AEPB: INHALATION_SPRAY | RESPIRATORY_TRACT | Status: DC

## 2014-02-26 MED ORDER — ALBUTEROL SULFATE (2.5 MG/3ML) 0.083% IN NEBU: INHALATION_SOLUTION | RESPIRATORY_TRACT | Status: DC

## 2014-02-26 MED ORDER — AMLODIPINE BESYLATE 10 MG PO TABS: 10.0000 mg | ORAL_TABLET | Freq: Every day | ORAL | Status: DC

## 2014-02-26 MED ORDER — FLUTICASONE PROPIONATE 50 MCG/ACT NA SUSP: 2.0000 | Freq: Every day | NASAL | Status: DC

## 2014-02-26 MED ORDER — TADALAFIL 10 MG PO TABS: 10.0000 mg | ORAL_TABLET | Freq: Every day | ORAL | Status: DC | PRN

## 2014-02-26 MED ORDER — FEXOFENADINE HCL 180 MG PO TABS: 180.0000 mg | ORAL_TABLET | Freq: Every day | ORAL | Status: DC

## 2014-02-26 NOTE — Patient Instructions (Signed)
Tadalafil tablets (Cialis) What is this medicine? TADALAFIL (tah DA la fil) is used to treat erection problems in men. It is also used for enlargement of the prostate gland in men, a condition called benign prostatic hyperplasia or BPH. This medicine improves urine flow and reduces BPH symptoms. This medicine can also treat both erection problems and BPH when they occur together. This medicine may be used for other purposes; ask your health care provider or pharmacist if you have questions. COMMON BRAND NAME(S): Cialis What should I tell my health care provider before I take this medicine? They need to know if you have any of these conditions: -bleeding disorders -eye or vision problems, including a rare inherited eye disease called retinitis pigmentosa -anatomical deformation of the penis, Peyronie's disease, or history of priapism (painful and prolonged erection) -heart disease, angina, a history of heart attack, irregular heart beats, or other heart problems -high or low blood pressure -history of blood diseases, like sickle cell anemia or leukemia -history of stomach bleeding -kidney disease -liver disease -stroke -an unusual or allergic reaction to tadalafil, other medicines, foods, dyes, or preservatives -pregnant or trying to get pregnant -breast-feeding How should I use this medicine? Take this medicine by mouth with a glass of water. Follow the directions on the prescription label. You may take this medicine with or without meals. When this medicine is used for erection problems, your doctor may prescribe it to be taken once daily or as needed. If you are taking the medicine as needed, you may be able to have sexual activity 30 minutes after taking it and for up to 36 hours after taking it. Whether you are taking the medicine as needed or once daily, you should not take more than one dose per day. If you are taking this medicine for symptoms of benign prostatic hyperplasia (BPH) or to  treat both BPH and an erection problem, take the dose once daily at about the same time each day. Do not take your medicine more often than directed. Talk to your pediatrician regarding the use of this medicine in children. Special care may be needed. Overdosage: If you think you have taken too much of this medicine contact a poison control center or emergency room at once. NOTE: This medicine is only for you. Do not share this medicine with others. What if I miss a dose? If you are taking this medicine as needed for erection problems, this does not apply. If you miss a dose while taking this medicine once daily for an erection problem, benign prostatic hyperplasia, or both, take it as soon as you remember, but do not take more than one dose per day. What may interact with this medicine? Do not take this medicine with any of the following medications: -nitrates like amyl nitrite, isosorbide dinitrate, isosorbide mononitrate, nitroglycerin -other medicines for erectile dysfunction like avanafil, sildenafil, vardenafil -other tadalafil products (Adcirca)  This medicine may also interact with the following medications: -certain drugs for high blood pressure -certain drugs for the treatment of HIV infection or AIDS -certain drugs used for fungal or yeast infections, like fluconazole, itraconazole, ketoconazole, and voriconazole -certain drugs used for seizures like carbamazepine, phenytoin, and phenobarbital -grapefruit juice -macrolide antibiotics like clarithromycin, erythromycin, troleandomycin -medicines for prostate problems -rifabutin, rifampin or rifapentine This list may not describe all possible interactions. Give your health care provider a list of all the medicines, herbs, non-prescription drugs, or dietary supplements you use. Also tell them if you smoke, drink alcohol, or use  illegal drugs. Some items may interact with your medicine. What should I watch for while using this  medicine? If you notice any changes in your vision while taking this drug, call your doctor or health care professional as soon as possible. Stop using this medicine and call your health care provider right away if you have a loss of sight in one or both eyes. Contact your doctor or health care professional right away if the erection lasts longer than 4 hours or if it becomes painful. This may be a sign of serious problem and must be treated right away to prevent permanent damage. If you experience symptoms of nausea, dizziness, chest pain or arm pain upon initiation of sexual activity after taking this medicine, you should refrain from further activity and call your doctor or health care professional as soon as possible. Do not drink alcohol to excess (examples, 5 glasses of wine or 5 shots of whiskey) when taking this medicine. When taken in excess, alcohol can increase your chances of getting a headache or getting dizzy, increasing your heart rate or lowering your blood pressure. Using this medicine does not protect you or your partner against HIV infection (the virus that causes AIDS) or other sexually transmitted diseases. What side effects may I notice from receiving this medicine? Side effects that you should report to your doctor or health care professional as soon as possible: -allergic reactions like skin rash, itching or hives, swelling of the face, lips, or tongue -breathing problems -changes in hearing -changes in vision -chest pain -fast, irregular heartbeat -prolonged or painful erection -seizures  Side effects that usually do not require medical attention (report to your doctor or health care professional if they continue or are bothersome): -back pain -dizziness -flushing -headache -indigestion -muscle aches -nausea -stuffy or runny nose This list may not describe all possible side effects. Call your doctor for medical advice about side effects. You may report side effects  to FDA at 1-800-FDA-1088. Where should I keep my medicine? Keep out of the reach of children. Store at room temperature between 15 and 30 degrees C (59 and 86 degrees F). Throw away any unused medicine after the expiration date. NOTE: This sheet is a summary. It may not cover all possible information. If you have questions about this medicine, talk to your doctor, pharmacist, or health care provider.  2014, Elsevier/Gold Standard. (2012-11-07 13:42:23)

## 2014-02-26 NOTE — Assessment & Plan Note (Addendum)
Well controlled, taking meds - refilled albuterol, advair, allegra, flonase

## 2014-02-26 NOTE — Progress Notes (Signed)
   Subjective:    Patient ID: [name], male    DOB: 1956/09/14, 58 y.o.   MRN: [id number]  HPI CHRONIC HYPERTENSION  Disease Monitoring  Blood pressure range: 120-150/80s  Chest pain: no   Dyspnea: yes, also has COPD   Claudication: no   Medication compliance: yes  Medication Side Effects  Lightheadedness: no   Urinary frequency: no   Edema: no   Impotence: yes, complaining of ED, long term issue   Preventitive Healthcare:  Exercise: some walking   Diet Pattern: varied, high fat  Salt Restriction: no  Erectile Dysfunction: Patient complains of difficulty getting and keeping erections for several years and getting worse recently. He has only been erect twice this year and it did not last long enough for intercourse either time. He is married and is worried he can't keep her satisfied. He has seen adds for cialis and wants to try that.   Review of Systems  Constitutional: Negative for fever.  Respiratory: Positive for cough and shortness of breath.   Cardiovascular: Negative for chest pain and palpitations.  Gastrointestinal: Negative for nausea, vomiting and diarrhea.  Genitourinary: Negative for difficulty urinating.  Neurological: Negative for dizziness, syncope and light-headedness.  All other systems reviewed and are negative.      Objective:   Physical Exam  Nursing note and vitals reviewed. Constitutional: He is oriented to person, place, and time. He appears well-developed and well-nourished. No distress.  HENT:  Head: Normocephalic and atraumatic.  Eyes: Conjunctivae are normal. Right eye exhibits no discharge. Left eye exhibits no discharge. No scleral icterus.  Cardiovascular: Normal rate, regular rhythm and normal heart sounds.   Pulmonary/Chest: Effort normal and breath sounds normal.  Abdominal: Soft. He exhibits no distension. There is no tenderness.  Musculoskeletal: Normal range of motion. He exhibits no edema and no tenderness.  Neurological:  He is alert and oriented to person, place, and time.  Skin: Skin is warm and dry. No rash noted. He is not diaphoretic.  Psychiatric: He has a normal mood and affect. His behavior is normal.       Assessment & Plan:

## 2014-02-26 NOTE — Assessment & Plan Note (Signed)
Poor control over the past few visit, 150/86 today - refill amlodipine - add HCTZ 25mg 

## 2014-02-26 NOTE — Assessment & Plan Note (Signed)
Several years ED symptoms, never treated - trial of cialis, gave precautions and reasons to seek care

## 2014-09-26 ENCOUNTER — Ambulatory Visit (INDEPENDENT_AMBULATORY_CARE_PROVIDER_SITE_OTHER): Payer: Commercial Managed Care - HMO | Admitting: Family Medicine

## 2014-09-26 ENCOUNTER — Encounter: Payer: Self-pay | Admitting: Family Medicine

## 2014-09-26 VITALS — BP 135/78 | HR 96 | Temp 100.5°F | Ht 69.0 in | Wt 153.3 lb

## 2014-09-26 DIAGNOSIS — J441 Chronic obstructive pulmonary disease with (acute) exacerbation: Secondary | ICD-10-CM

## 2014-09-26 MED ORDER — PREDNISONE 50 MG PO TABS: 50.0000 mg | ORAL_TABLET | Freq: Every day | ORAL | Status: DC

## 2014-09-26 MED ORDER — DOXYCYCLINE HYCLATE 100 MG PO TABS: 100.0000 mg | ORAL_TABLET | Freq: Two times a day (BID) | ORAL | Status: DC

## 2014-09-26 NOTE — Patient Instructions (Addendum)
Thank you for coming to see me today. It was a pleasure. Today we talked about:   COPD exacerbation: I have prescribed an antibiotic and steroid for you to take for 5 days. If you breathing worsens, please return for medical care.  Please make an appointment to see Dr. Sherril Cong in one month  If you have any questions or concerns, please do not hesitate to call the office at [phone].  Sincerely,  Cordelia Poche, MD

## 2014-09-26 NOTE — Assessment & Plan Note (Addendum)
Patient's presentation consistent with a COPD exacerbation most likely caused by URI. Will prescribe antibiotics (doxycycline 100mg  BID x5 days) and prednisone burst of 50mg  x5days. Patient to continue albuterol use. Recommend follow-up with PCP. Patient is due for repeat PFTs in March.

## 2014-09-26 NOTE — Progress Notes (Signed)
    Subjective    [name] is a 58 y.o. male that presents for an office visit.   1. URI symptoms: symptoms started a week a week ago with rhinorrhea, dyspnea, productive cough with yellow sputum. Took aspirin which has not helped.  Has had some wheezing. Has been using albuterol nebulizer 3-4 per day in addition to the inhaler 3-4 times per day. Has been using Advair twice per day. Last used albuterol around 2:30PM. No fevers at home. Last had steroids over one year ago.   History  Substance Use Topics  . Smoking status: Former Smoker -- 0.25 packs/day for 30 years    Types: Cigarettes    Quit date: 08/21/2013  . Smokeless tobacco: Never Used  . Alcohol Use: Yes     Comment: rare/occasional    Allergies  Allergen Reactions  . Tiotropium Bromide Monohydrate Other (See Comments)    hallucinations    No orders of the defined types were placed in this encounter.    ROS  Per HPI   Objective   BP 135/78 mmHg  Pulse 96  Temp(Src) 100.5 F (38.1 C) (Oral)  Ht 5\' 9"  (1.753 m)  Wt 153 lb 4.8 oz (69.536 kg)  BMI 22.63 kg/m2  General: Well appearing in no distress Respiratory/Chest: Diminished air movement, diffuse wheezing bilaterally with no accessory muscle usage  Assessment and Plan   Please refer to problem based charting of assessment and plan

## 2014-12-04 ENCOUNTER — Encounter (HOSPITAL_COMMUNITY): Payer: Self-pay | Admitting: Otolaryngology

## 2014-12-12 ENCOUNTER — Other Ambulatory Visit: Payer: Self-pay | Admitting: Family Medicine

## 2014-12-16 ENCOUNTER — Other Ambulatory Visit: Payer: Self-pay | Admitting: *Deleted

## 2014-12-16 DIAGNOSIS — J441 Chronic obstructive pulmonary disease with (acute) exacerbation: Secondary | ICD-10-CM

## 2014-12-16 NOTE — Telephone Encounter (Signed)
Pt need refill on neb solution ASAP.  Third request from the pharmacy.  Derl Barrow, RN

## 2014-12-17 MED ORDER — ALBUTEROL SULFATE (2.5 MG/3ML) 0.083% IN NEBU
INHALATION_SOLUTION | RESPIRATORY_TRACT | Status: DC
Start: 2014-12-17 — End: 2015-11-13

## 2015-01-03 ENCOUNTER — Other Ambulatory Visit: Payer: Self-pay | Admitting: Family Medicine

## 2015-01-03 DIAGNOSIS — J4521 Mild intermittent asthma with (acute) exacerbation: Secondary | ICD-10-CM

## 2015-03-06 ENCOUNTER — Telehealth: Payer: Self-pay | Admitting: Family Medicine

## 2015-03-06 NOTE — Telephone Encounter (Signed)
Prior Authorization form printed from [place].  PA form and formulary placed in provider box for review.  Derl Barrow, RN

## 2015-03-06 NOTE — Telephone Encounter (Signed)
Pt received a letter stating his insurance no longer covers his proventil, he has had much success with using this one and wants to know if we can get a prior authorization

## 2015-03-09 NOTE — Telephone Encounter (Signed)
Per [employer] a PA is not required.  Patient should be able to pick up medication from the pharmacy for contracted copayment.  Derl Barrow, RN

## 2015-03-09 NOTE — Telephone Encounter (Signed)
PA form faxed to [employer] for review.  Martin, Tamika L, RN  

## 2015-04-01 ENCOUNTER — Other Ambulatory Visit: Payer: Self-pay | Admitting: Family Medicine

## 2015-04-01 DIAGNOSIS — J42 Unspecified chronic bronchitis: Secondary | ICD-10-CM

## 2015-04-22 ENCOUNTER — Other Ambulatory Visit: Payer: Self-pay | Admitting: Family Medicine

## 2015-04-22 DIAGNOSIS — I10 Essential (primary) hypertension: Secondary | ICD-10-CM

## 2015-06-09 ENCOUNTER — Ambulatory Visit: Payer: Commercial Managed Care - HMO | Admitting: Family Medicine

## 2015-07-07 ENCOUNTER — Encounter: Payer: Self-pay | Admitting: Family Medicine

## 2015-07-07 ENCOUNTER — Ambulatory Visit (INDEPENDENT_AMBULATORY_CARE_PROVIDER_SITE_OTHER): Payer: Commercial Managed Care - HMO | Admitting: Family Medicine

## 2015-07-07 VITALS — BP 155/90 | HR 74 | Temp 98.1°F | Ht 69.0 in | Wt 148.2 lb

## 2015-07-07 DIAGNOSIS — I10 Essential (primary) hypertension: Secondary | ICD-10-CM

## 2015-07-07 DIAGNOSIS — J42 Unspecified chronic bronchitis: Secondary | ICD-10-CM

## 2015-07-07 DIAGNOSIS — R42 Dizziness and giddiness: Secondary | ICD-10-CM | POA: Diagnosis not present

## 2015-07-07 NOTE — Progress Notes (Signed)
PA form placed in provider box for completion. Return to [place], Therapist, sports once completed.  Derl Barrow, RN

## 2015-07-07 NOTE — Progress Notes (Signed)
[name] - Mr. Roper was seen in office today, states that Proventil inhaler is not covered by [facility], can we please complete a prior authorization to attempt to get this covered.

## 2015-07-07 NOTE — Progress Notes (Signed)
Subjective:     Patient ID: [name], male   DOB: [birth year], 59 y.o.   MRN: [id number]  HPI   59 y/o male presents for follow up of COPD and HTN.   HTN: The patient has not been taking his HCTZ and has only been taking half of his amlodipine pills because he says that it is causing him to have ED and dizziness. He has no longer experienced ED or dizzy spells since doing so. Dizzy spells occur at rest, no associated chest pain or sob, no loc, symptoms last seconds to minutes, denies orthopnea or pnd  COPD: Patient received a letter from his insurance saying they no longer cover his Proventil. He states that the require a fax from his PCP for them to continue this medication. He continues to take Advair twice daily and Proventil (MDA and nebs prn). No acute breathing issues.    Review of Systems  Constitutional: Positive for chills. Negative for fever and fatigue.  Respiratory: Positive for wheezing. Negative for cough and shortness of breath.   Cardiovascular: Negative for chest pain.       Objective:   Physical Exam Vitals: reviewed, hypertensive Gen: pleasant male, NAD Cardiac: RRR, S1 and S2 present, no murmur, no heaves/thrills Resp: CTAB, normal effort Ext: no edema  Reviewed labwork from 2014    Assessment:     Please see problem specific assessment and plan.      Plan:     Please see problem specific assessment and plan.

## 2015-07-07 NOTE — Assessment & Plan Note (Addendum)
Lightheadedness/dizzy spells resolved after stopping HCTZ and taking only 1/2 Amlodipine. No current cardiac signs/symtpoms.  -patient to restart HCTZ as blood pressure elevated today -if lightheadedness returns consider switching to ACE-I

## 2015-07-07 NOTE — Assessment & Plan Note (Signed)
Patient reports good control of symptoms with Advair and Proventil. [employer] no longer covers Proventil and asks if we can contact the insurance company to attempt to get this covered. -will discuss with Latina Craver RN and attempt to complete prior authorization

## 2015-07-07 NOTE — Assessment & Plan Note (Signed)
Blood pressure uncontrolled today (likely due to not taking HCTZ and only 1/2 of Amlodipine tablet) -patient encouraged to restart HCTZ -will need labs at next visit to monitor renal function -return in 2-3 weeks for visit with PCP Dr. Sherril Cong

## 2015-07-07 NOTE — Patient Instructions (Signed)
It was nice to meet you today.  Our office will contact you once we have completed the paperwork for the Proventil.  Blood pressure - elevated today, please restart HCTZ 25 mg daily and continue Amlodipine 5 mg daily  Return in 2-3 for recheck of blood pressure with Dr. Sherril Cong.

## 2015-07-08 NOTE — Progress Notes (Signed)
PA form completed and returned to [employer].

## 2015-07-08 NOTE — Progress Notes (Signed)
PA completed online at [email or web address]  PA pending for review.  Derl Barrow, RN

## 2015-07-13 NOTE — Progress Notes (Signed)
PA was denied from [facility].  Appeal forms completed and faxed to [facility].  Per representative process could take up to 7 days.  Derl Barrow, RN

## 2015-07-13 NOTE — Progress Notes (Signed)
Patient called stating that he received letter from [facility] that they denied the PA for Proventil.  Advised patient that an appeal was submitted to and the process could take up to 7 days to complete.  Derl Barrow, RN

## 2015-07-14 NOTE — Progress Notes (Signed)
Patient informed that appeal was approved from [facility] effective 07/14/15-11/21/15.  Reference number [id number].  Derl Barrow, RN

## 2015-07-15 MED ORDER — ALBUTEROL SULFATE HFA 108 (90 BASE) MCG/ACT IN AERS: 2.0000 | INHALATION_SPRAY | Freq: Four times a day (QID) | RESPIRATORY_TRACT | Status: DC | PRN

## 2015-07-15 NOTE — Progress Notes (Signed)
Refill of Proventil sent to pharmacy.

## 2015-07-15 NOTE — Addendum Note (Signed)
Addended by: Lupita Dawn on: 07/15/2015 10:10 AM   Modules accepted: Orders

## 2015-08-13 ENCOUNTER — Other Ambulatory Visit: Payer: Self-pay | Admitting: Family Medicine

## 2015-08-13 DIAGNOSIS — J301 Allergic rhinitis due to pollen: Secondary | ICD-10-CM

## 2015-09-04 ENCOUNTER — Ambulatory Visit (INDEPENDENT_AMBULATORY_CARE_PROVIDER_SITE_OTHER): Payer: Commercial Managed Care - HMO

## 2015-09-04 DIAGNOSIS — Z23 Encounter for immunization: Secondary | ICD-10-CM

## 2015-09-21 ENCOUNTER — Ambulatory Visit: Payer: Commercial Managed Care - HMO | Admitting: Family Medicine

## 2015-10-23 ENCOUNTER — Ambulatory Visit (INDEPENDENT_AMBULATORY_CARE_PROVIDER_SITE_OTHER): Payer: Commercial Managed Care - HMO | Admitting: Family Medicine

## 2015-10-23 ENCOUNTER — Encounter: Payer: Self-pay | Admitting: Family Medicine

## 2015-10-23 VITALS — BP 150/84 | HR 84 | Temp 98.1°F | Ht 69.0 in | Wt 143.9 lb

## 2015-10-23 DIAGNOSIS — J441 Chronic obstructive pulmonary disease with (acute) exacerbation: Secondary | ICD-10-CM | POA: Diagnosis not present

## 2015-10-23 DIAGNOSIS — J42 Unspecified chronic bronchitis: Secondary | ICD-10-CM

## 2015-10-23 DIAGNOSIS — Z23 Encounter for immunization: Secondary | ICD-10-CM | POA: Diagnosis not present

## 2015-10-23 MED ORDER — DOXYCYCLINE HYCLATE 100 MG PO TABS: 100.0000 mg | ORAL_TABLET | Freq: Two times a day (BID) | ORAL | Status: DC

## 2015-10-23 MED ORDER — HYDROCODONE-HOMATROPINE 5-1.5 MG/5ML PO SYRP: 5.0000 mL | ORAL_SOLUTION | Freq: Three times a day (TID) | ORAL | Status: DC | PRN

## 2015-10-23 MED ORDER — ZOSTER VACCINE LIVE 19400 UNT/0.65ML ~~LOC~~ SOLR: 0.6500 mL | Freq: Once | SUBCUTANEOUS | Status: DC

## 2015-10-23 MED ORDER — PREDNISONE 50 MG PO TABS
50.0000 mg | ORAL_TABLET | Freq: Every day | ORAL | Status: DC
Start: 2015-10-23 — End: 2016-10-02

## 2015-10-23 NOTE — Assessment & Plan Note (Signed)
1 week of cough, increased green sputum production, dyspnea, subjective fever and rhinorrhea - doxy and prednisone - scheduled albuterol q4h - rtc or ED for worsening dyspnea, CP, fevers

## 2015-10-23 NOTE — Progress Notes (Signed)
   Subjective:   [name] is a 59 y.o. male with a history of COPD, HTN here for cough  Pt reports 1 week of worsening cough, runny nose/congestion, fatigue. Cough is productive of lumpy green/yellow sputum which is not normal for him and he is starting to feel short of breath. He has not taken his temperature but has felt sweaty and feverish.  Review of Systems:  Per HPI. All other systems reviewed and are negative.   PMH, PSH, Medications, Allergies, and FmHx reviewed and updated in EMR.  Social History: former smoker  Objective:  BP 150/84 mmHg  Pulse 84  Temp(Src) 98.1 F (36.7 C) (Oral)  Ht 5\' 9"  (1.753 m)  Wt 143 lb 14.4 oz (65.273 kg)  BMI 21.24 kg/m2  Gen:  59 y.o. male in NAD HEENT: NCAT, MMM, EOMI, PERRL, anicteric sclerae CV: RRR, no MRG, no JVD Resp: Non-labored, scattered wheezes noted and poor air movement throughout, no focal findings Abd: Soft, NTND, BS present, no guarding or organomegaly Ext: WWP, no edema MSK: Full ROM, strength intact Neuro: Alert and oriented, speech normal      Chemistry      Component Value Date/Time   NA 139 05/04/2013 1015   K 4.0 05/04/2013 1015   CL 105 05/04/2013 1015   CO2 21 05/04/2013 1015   BUN 21 05/04/2013 1015   CREATININE 1.07 05/04/2013 1015      Component Value Date/Time   CALCIUM 9.5 05/04/2013 1015   ALKPHOS 66 11/25/2011 0405   AST 22 11/25/2011 0405   ALT 24 11/25/2011 0405   BILITOT 0.4 11/25/2011 0405      Lab Results  Component Value Date   WBC 11.1* 05/04/2013   HGB 14.3 05/04/2013   HCT 40.7 05/04/2013   MCV 92.3 05/04/2013   PLT 259 05/04/2013   No results found for: TSH No results found for: HGBA1C Assessment & Plan:     [name] is a 59 y.o. male here for cough  COPD exacerbation 1 week of cough, increased green sputum production, dyspnea, subjective fever and rhinorrhea - doxy and prednisone - scheduled albuterol q4h - rtc or ED for worsening dyspnea, CP,  fevers    Beverlyn Roux, MD, MPH [facility] PGY-3 10/23/2015 3:20 PM

## 2015-10-23 NOTE — Patient Instructions (Signed)

## 2015-11-13 ENCOUNTER — Other Ambulatory Visit: Payer: Self-pay | Admitting: *Deleted

## 2015-11-13 DIAGNOSIS — J441 Chronic obstructive pulmonary disease with (acute) exacerbation: Secondary | ICD-10-CM

## 2015-11-13 MED ORDER — ALBUTEROL SULFATE (2.5 MG/3ML) 0.083% IN NEBU: INHALATION_SOLUTION | RESPIRATORY_TRACT | Status: DC

## 2015-11-24 ENCOUNTER — Ambulatory Visit (INDEPENDENT_AMBULATORY_CARE_PROVIDER_SITE_OTHER): Payer: Commercial Managed Care - HMO | Admitting: Family Medicine

## 2015-11-24 ENCOUNTER — Encounter: Payer: Self-pay | Admitting: Family Medicine

## 2015-11-24 VITALS — BP 154/90 | HR 90 | Temp 98.7°F | Resp 18 | Wt 143.2 lb

## 2015-11-24 DIAGNOSIS — J209 Acute bronchitis, unspecified: Secondary | ICD-10-CM

## 2015-11-24 DIAGNOSIS — J069 Acute upper respiratory infection, unspecified: Secondary | ICD-10-CM | POA: Diagnosis not present

## 2015-11-24 MED ORDER — HYDROCODONE-HOMATROPINE 5-1.5 MG/5ML PO SYRP: 5.0000 mL | ORAL_SOLUTION | Freq: Three times a day (TID) | ORAL | Status: DC | PRN

## 2015-11-24 MED ORDER — AZITHROMYCIN 250 MG PO TABS: ORAL_TABLET | ORAL | Status: DC

## 2015-11-24 NOTE — Progress Notes (Signed)
   Subjective:    Patient ID: [name], male    DOB: 02/03/1956, 60 y.o.   MRN: [id number]  HPI  Patient presents for Same Day Appointment  CC: cough  # Cough:  Started about 2-3 days ago. Feels like same as 1 month ago  Also has some wheezing  Clear runny nose, sneezing  Doesn't use over the counters because he says that has put him in the hospital before for taking it (says robitussin and alka seltzer)  Was around some sick people at the holidays ROS: no fever  Social Hx: former smoker  Review of Systems   See HPI for ROS.   Past medical history, surgical, family, and social history reviewed and updated in the EMR as appropriate.  Objective:  BP 154/90 mmHg  Pulse 90  Temp(Src) 98.7 F (37.1 C) (Oral)  Resp 18  Wt 143 lb 3.2 oz (64.955 kg)  SpO2 97% Vitals and nursing note reviewed  General: NAD HEENT: normal TMs bilaterally. Copious clear rhinorrhea. Posterior pharynx mildly erythematous.  CV: RRR, normal s1s2, no murmurs, rubs or gallop Resp: mildly decreased breath sounds bilaterally, scant wheeze and rhonchi. Normal effort  Assessment & Plan:   1. Acute bronchitis, unspecified organism - z pack x 5 days - HYDROcodone-homatropine (HYCODAN) 5-1.5 MG/5ML syrup; Take 5 mLs by mouth every 8 (eight) hours as needed for cough.  Dispense: 120 mL; Refill: 0  2. URI (upper respiratory infection) As above

## 2015-11-24 NOTE — Patient Instructions (Signed)
Suspect you are most likely having a viral infection, but could also still be bronchitis caused by a bacteria since you are at risk for this with your lungs and COPD.  Try the z-pack, come back to the clinic if you are experiencing worsening wheezing.    Stay hydrated - drink a lot of water   Nasal Saline Spray  Sneezing & Runny nose: Cetirizine (Zyrtec), Fexofenadine (Allegra), Loratadine (Claritin)  Pain/Sore throat: Tylenol, Ibuprofen  Wash your hands often to prevent spreading the virus

## 2016-02-26 ENCOUNTER — Other Ambulatory Visit: Payer: Self-pay | Admitting: Family Medicine

## 2016-02-26 DIAGNOSIS — J410 Simple chronic bronchitis: Secondary | ICD-10-CM

## 2016-05-29 ENCOUNTER — Encounter (HOSPITAL_COMMUNITY): Payer: Self-pay | Admitting: *Deleted

## 2016-05-29 ENCOUNTER — Emergency Department (HOSPITAL_COMMUNITY)
Admission: EM | Admit: 2016-05-29 | Discharge: 2016-05-29 | Disposition: A | Discharge status: Home / Self Care | Payer: Commercial Managed Care - HMO | Attending: Emergency Medicine | Admitting: Emergency Medicine

## 2016-05-29 DIAGNOSIS — Z79899 Other long term (current) drug therapy: Secondary | ICD-10-CM | POA: Diagnosis not present

## 2016-05-29 DIAGNOSIS — Z7982 Long term (current) use of aspirin: Secondary | ICD-10-CM | POA: Insufficient documentation

## 2016-05-29 DIAGNOSIS — K0889 Other specified disorders of teeth and supporting structures: Secondary | ICD-10-CM | POA: Diagnosis not present

## 2016-05-29 DIAGNOSIS — Z87891 Personal history of nicotine dependence: Secondary | ICD-10-CM | POA: Insufficient documentation

## 2016-05-29 DIAGNOSIS — K029 Dental caries, unspecified: Secondary | ICD-10-CM

## 2016-05-29 DIAGNOSIS — K047 Periapical abscess without sinus: Secondary | ICD-10-CM | POA: Diagnosis not present

## 2016-05-29 DIAGNOSIS — Z720 Tobacco use: Secondary | ICD-10-CM

## 2016-05-29 DIAGNOSIS — I10 Essential (primary) hypertension: Secondary | ICD-10-CM | POA: Diagnosis not present

## 2016-05-29 DIAGNOSIS — J449 Chronic obstructive pulmonary disease, unspecified: Secondary | ICD-10-CM | POA: Diagnosis not present

## 2016-05-29 MED ORDER — NAPROXEN 500 MG PO TABS: 500.0000 mg | ORAL_TABLET | Freq: Two times a day (BID) | ORAL | Status: DC | PRN

## 2016-05-29 MED ORDER — PENICILLIN V POTASSIUM 500 MG PO TABS: 1000.0000 mg | ORAL_TABLET | Freq: Two times a day (BID) | ORAL | Status: DC

## 2016-05-29 MED ORDER — HYDROCODONE-ACETAMINOPHEN 5-325 MG PO TABS
1.0000 | ORAL_TABLET | Freq: Four times a day (QID) | ORAL | Status: DC | PRN
Start: 2016-05-29 — End: 2016-10-04

## 2016-05-29 NOTE — ED Provider Notes (Signed)
CSN: [id number]     Arrival date & time 05/29/16  1302 History   By signing my name below, I, Soijett Blue, attest that this documentation has been prepared under the direction and in the presence of [employer], [employer] Electronically Signed: Soijett Blue, ED Scribe. 05/29/2016. 1:19 PM.   Chief Complaint  Patient presents with  . Dental Problem      Patient is a 60 y.o. male presenting with tooth pain. The history is provided by the patient and medical records. No language interpreter was used.  Dental Pain Location:  Upper Upper teeth location: right upper front gum. Quality:  Aching and throbbing Severity:  Severe Onset quality:  Sudden Duration:  1 week Timing:  Constant Progression:  Unchanged Chronicity:  Recurrent Context: poor dentition   Relieved by:  Acetaminophen Worsened by:  Cold food/drink (cold air) Ineffective treatments:  None tried Associated symptoms: no difficulty swallowing, no drooling, no facial swelling, no fever, no gum swelling, no neck pain, no neck swelling, no oral bleeding and no trismus   Risk factors: lack of dental care and smoking   Risk factors: no immunosuppression     [name] is a 60 y.o. male with a PMHx of COPD and HTN, who presents to the Emergency Department complaining of right upper front dental/gum pain onset 1 week. Pt notes that he has had dental pain to the same area in the past that resulted in a dental abscess that he had to have lanced. He states that he doesn't have a dentist at this time. Reports that he doesn't have an abscess yet but is worried he might develop one. Pt's pain is 10/10, aching/throbbing, constant, in the R upper gumline near the front/incisor region, and it doesn't radiate. He states that cold air worsens his pain. He reports that he has tried 500 mg tylenol with minimal relief for his symptoms. He denies gum drainage/bleeding, facial or gum swelling, fever, chills, trismus, trouble swallowing,  drooling, neck swelling, neck pain, ear pain/discharge, rash, body aches, CP, SOB, abdominal pain, n/v/d/c, dysuria, hematuria, numbness, tingling, weakness, and any other symptoms. Pt notes that he is a current cigarette smoker and he had stopped smoking for 1 year in the past but picked it back up.     Past Medical History  Diagnosis Date  . COPD (chronic obstructive pulmonary disease) ([place])   . Hypertension   . Asthma   . Shortness of breath    Past Surgical History  Procedure Laterality Date  . Colonoscopy     Family History  Problem Relation Age of Onset  . Cancer Father    Social History  Substance Use Topics  . Smoking status: Former Smoker -- 0.25 packs/day for 30 years    Types: Cigarettes    Quit date: 08/21/2013  . Smokeless tobacco: Never Used  . Alcohol Use: Yes     Comment: rare/occasional    Review of Systems  Constitutional: Negative for fever and chills.  HENT: Positive for dental problem (right upper). Negative for drooling, ear discharge, ear pain, facial swelling, sore throat and trouble swallowing.   Respiratory: Negative for shortness of breath.   Cardiovascular: Negative for chest pain.  Gastrointestinal: Negative for nausea, vomiting, abdominal pain, diarrhea and constipation.  Genitourinary: Negative for dysuria and hematuria.  Musculoskeletal: Negative for myalgias and neck pain.  Skin: Negative for rash.  Allergic/Immunologic: Negative for immunocompromised state.  Neurological: Negative for weakness and numbness.  Psychiatric/Behavioral: Negative for confusion.  A complete 10 system review of systems was obtained and all systems are negative except as noted in the HPI and PMH.   Allergies  Tiotropium bromide monohydrate  Home Medications   Prior to Admission medications   Medication Sig Start Date End Date Taking? Authorizing Provider  ADVAIR DISKUS 500-50 MCG/DOSE AEPB INHALE ONE OUFF INTO THE LUNGS TWICE A DAY 02/29/16   Frazier Richards, MD  albuterol (PROVENTIL) (2.5 MG/3ML) 0.083% nebulizer solution USE 1 VIAL IN NEBULIZER 4 TIMES A DAY AS NEEDED FOR SHORTNESS OF BREATH 11/13/15   Frazier Richards, MD  amLODipine (NORVASC) 10 MG tablet TAKE 1 TABLET BY MOUTH EVERY DAY 04/22/15   Frazier Richards, MD  aspirin 81 MG EC tablet Take 81 mg by mouth daily.      Historical Provider, MD  azithromycin (ZITHROMAX) 250 MG tablet Take 2 tabs day 1, then 1 tab daily 11/24/15   Leone Brand, MD  doxycycline (VIBRA-TABS) 100 MG tablet Take 1 tablet (100 mg total) by mouth 2 (two) times daily. 10/23/15   Frazier Richards, MD  fexofenadine (ALLEGRA) 180 MG tablet Take 1 tablet (180 mg total) by mouth daily. 02/26/14   Frazier Richards, MD  fluticasone (FLONASE) 50 MCG/ACT nasal spray PLACE 2 SPRAYS INTO BOTH NOSTRILS DAILY. 08/14/15   Frazier Richards, MD  hydrochlorothiazide (HYDRODIURIL) 25 MG tablet Take 1 tablet (25 mg total) by mouth daily. 02/26/14   Frazier Richards, MD  HYDROcodone-homatropine [facility]) 5-1.5 MG/5ML syrup Take 5 mLs by mouth every 8 (eight) hours as needed for cough. 11/24/15   Leone Brand, MD  predniSONE (DELTASONE) 50 MG tablet Take 1 tablet (50 mg total) by mouth daily with breakfast. 10/23/15   Frazier Richards, MD  PROVENTIL HFA 108 [phone] Base) MCG/ACT inhaler INHALE 2 PUFFS INTO THE LUNGS EVERY 4 HOURS AS NEEDED FOR WHEEZING. 02/29/16   Frazier Richards, MD  zoster vaccine live, PF, (ZOSTAVAX) 16109 UNT/0.65ML injection Inject 19,400 Units into the skin once. 10/23/15   Frazier Richards, MD   BP 161/94 mmHg  Pulse 87  Temp(Src) 98.7 F (37.1 C) (Oral)  Resp 16  SpO2 100% Physical Exam  Constitutional: He is oriented to person, place, and time. Vital signs are normal. He appears well-developed and well-nourished.  Non-toxic appearance. No distress.  Afebrile, nontoxic, NAD  HENT:  Head: Normocephalic and atraumatic.  Nose: Nose normal.  Mouth/Throat: Uvula is midline, oropharynx is clear and moist and mucous membranes are normal. No trismus  in the jaw. Dental caries present. No dental abscesses or uvula swelling.  Diffuse dental decay with multiple missing teeth, right upper gums near the incisor region minimally swollen and erythematous with no focal abscess,  with multiple decayed teeth around this area, no evidence of Ludwig's. Nose clear. Oropharynx clear and moist, without uvular swelling or deviation, no trismus or drooling, no tonsillar swelling or erythema, no exudates.  Eyes: Conjunctivae and EOM are normal. Right eye exhibits no discharge. Left eye exhibits no discharge.  Neck: Normal range of motion. Neck supple.  Cardiovascular: Normal rate.   Pulmonary/Chest: Effort normal. No respiratory distress.  Abdominal: Normal appearance. He exhibits no distension.  Musculoskeletal: Normal range of motion.  Neurological: He is alert and oriented to person, place, and time. He has normal strength. No sensory deficit.  Skin: Skin is warm, dry and intact. No rash noted.  Psychiatric: He has a normal mood and affect.  Nursing note and vitals reviewed.  ED Course  Procedures (including critical care time) DIAGNOSTIC STUDIES: Oxygen Saturation is 100% on RA, nl by my interpretation.    COORDINATION OF CARE: 1:18 PM Discussed treatment plan with pt at bedside which includes abx Rx, referral and follow up with dentist, smoking cessation, and pt agreed to plan.    MDM   Final diagnoses:  Pain due to dental caries  Dental infection  Tobacco use    60 y.o. male here with Dental pain associated with dental decay and possible dental infection, no focal abscess seen, with patient afebrile, non toxic appearing and swallowing secretions well. I gave patient referral to dentist and stressed the importance of dental follow up for ultimate management of dental pain.  I have also discussed reasons to return immediately to the ER.  Patient expresses understanding and agrees with plan.  I will also give PCN VK and pain control. Smoking  cessation advised.   I personally performed the services described in this documentation, which was scribed in my presence. The recorded information has been reviewed and is accurate.  BP 161/94 mmHg  Pulse 87  Temp(Src) 98.7 F (37.1 C) (Oral)  Resp 16  SpO2 100%  Meds ordered this encounter  Medications  . penicillin v potassium (VEETID) 500 MG tablet    Sig: Take 2 tablets (1,000 mg total) by mouth 2 (two) times daily. X 7 days    Dispense:  28 tablet    Refill:  0    Order Specific Question:  Supervising Provider    Answer:  MILLER, BRIAN [[id number]]  . HYDROcodone-acetaminophen (NORCO) 5-325 MG tablet    Sig: Take 1 tablet by mouth every 6 (six) hours as needed for severe pain.    Dispense:  6 tablet    Refill:  0    Order Specific Question:  Supervising Provider    Answer:  MILLER, BRIAN [[id number]]  . naproxen (NAPROSYN) 500 MG tablet    Sig: Take 1 tablet (500 mg total) by mouth 2 (two) times daily as needed for mild pain, moderate pain or headache (TAKE WITH MEALS.).    Dispense:  20 tablet    Refill:  0    Order Specific Question:  Supervising Provider    Answer:  Noemi Chapel [[id number]]      Hazelene Doten Camprubi-Soms, PA-C 05/29/16 1323  Pattricia Boss, MD 05/29/16 [phone]

## 2016-05-29 NOTE — ED Notes (Signed)
RT upper dental pain. Pain 10/10

## 2016-05-29 NOTE — ED Notes (Signed)
Declined W/C at D/C and was escorted to lobby by RN. 

## 2016-05-29 NOTE — Discharge Instructions (Signed)
Apply warm compresses to face throughout the day. Take antibiotic until finished. Use naprosyn and norco as directed as needed for pain but don't drive while taking norco. Perform salt water swishes to help with pain/swelling. Followup with a dentist is very important for ongoing evaluation and management of recurrent dental pain, call the dentist listed above in the next 24-48 hours to schedule ongoing dental care, or use the list below to find a dentist. STOP SMOKING! Return to emergency department for emergent changing or worsening symptoms.   Dental Caries Dental caries is tooth decay. This decay can cause a hole in teeth (cavity) that can get bigger and deeper over time. HOME CARE  Brush and floss your teeth. Do this at least two times a day.  Use a fluoride toothpaste.  Use a mouth rinse if told by your dentist or doctor.  Eat less sugary and starchy foods. Drink less sugary drinks.  Avoid snacking often on sugary and starchy foods. Avoid sipping often on sugary drinks.  Keep regular checkups and cleanings with your dentist.  Use fluoride supplements if told by your dentist or doctor.  Allow fluoride to be applied to teeth if told by your dentist or doctor.   This information is not intended to replace advice given to you by your health care provider. Make sure you discuss any questions you have with your health care provider.   Document Released: 08/16/2008 Document Revised: 11/28/2014 Document Reviewed: 11/09/2012 Elsevier Interactive Patient Education 2016 [place] Pain Dental pain may be caused by many things, including:  Tooth decay (cavities or caries). Cavities cause the nerve of your tooth to be open to air and hot or cold temperatures. This can cause pain or discomfort.  Abscess or infection. A dental abscess is an area that is full of infected pus from a bacterial infection in the inner part of the tooth (pulp). It usually happens at the end of the  tooth's root.  Injury.  An unknown reason (idiopathic). Your pain may be mild or severe. It may only happen when:  You are chewing.  You are exposed to hot or cold temperature.  You are eating or drinking sugary foods or beverages, such as:  Soda.  Candy. Your pain may also be there all of the time. HOME CARE Watch your dental pain for any changes. Do these things to lessen your discomfort:  Take medicines only as told by your dentist.  If your dentist tells you to take an antibiotic medicine, finish all of it even if you start to feel better.  Keep all follow-up visits as told by your dentist. This is important.  Do not apply heat to the outside of your face.  Rinse your mouth or gargle with salt water if told by your dentist. This helps with pain and swelling.  You can make salt water by adding  tsp of salt to 1 cup of warm water.  Apply ice to the painful area of your face:  Put ice in a plastic bag.  Place a towel between your skin and the bag.  Leave the ice on for 20 minutes, 2-3 times per day.  Avoid foods or drinks that cause you pain, such as:  Very hot or very cold foods or drinks.  Sweet or sugary foods or drinks. GET HELP IF:  Your pain is not helped with medicines.  Your symptoms are worse.  You have new symptoms. GET HELP RIGHT AWAY IF:  You cannot open  your mouth.  You are having trouble breathing or swallowing.  You have a fever.  Your face, neck, or jaw is puffy (swollen).   This information is not intended to replace advice given to you by your health care provider. Make sure you discuss any questions you have with your health care provider.   Document Released: 04/25/2008 Document Revised: 03/24/2015 Document Reviewed: 11/03/2014 Elsevier Interactive Patient Education 2016 [employer].  Smoking Cessation, Tips for Success If you are ready to quit smoking, congratulations! You have chosen to help yourself be healthier.  Cigarettes bring nicotine, tar, carbon monoxide, and other irritants into your body. Your lungs, heart, and blood vessels will be able to work better without these poisons. There are many different ways to quit smoking. Nicotine gum, nicotine patches, a nicotine inhaler, or nicotine nasal spray can help with physical craving. Hypnosis, support groups, and medicines help break the habit of smoking. WHAT THINGS CAN I DO TO MAKE QUITTING EASIER?  Here are some tips to help you quit for good:  Pick a date when you will quit smoking completely. Tell all of your friends and family about your plan to quit on that date.  Do not try to slowly cut down on the number of cigarettes you are smoking. Pick a quit date and quit smoking completely starting on that day.  Throw away all cigarettes.   Clean and remove all ashtrays from your home, work, and car.  On a card, write down your reasons for quitting. Carry the card with you and read it when you get the urge to smoke.  Cleanse your body of nicotine. Drink enough water and fluids to keep your urine clear or pale yellow. Do this after quitting to flush the nicotine from your body.  Learn to predict your moods. Do not let a bad situation be your excuse to have a cigarette. Some situations in your life might tempt you into wanting a cigarette.  Never have "just one" cigarette. It leads to wanting another and another. Remind yourself of your decision to quit.  Change habits associated with smoking. If you smoked while driving or when feeling stressed, try other activities to replace smoking. Stand up when drinking your coffee. Brush your teeth after eating. Sit in a different chair when you read the paper. Avoid alcohol while trying to quit, and try to drink fewer caffeinated beverages. Alcohol and caffeine may urge you to smoke.  Avoid foods and drinks that can trigger a desire to smoke, such as sugary or spicy foods and alcohol.  Ask people who smoke not  to smoke around you.  Have something planned to do right after eating or having a cup of coffee. For example, plan to take a walk or exercise.  Try a relaxation exercise to calm you down and decrease your stress. Remember, you may be tense and nervous for the first 2 weeks after you quit, but this will pass.  Find new activities to keep your hands busy. Play with a pen, coin, or rubber band. Doodle or draw things on paper.  Brush your teeth right after eating. This will help cut down on the craving for the taste of tobacco after meals. You can also try mouthwash.   Use oral substitutes in place of cigarettes. Try using lemon drops, carrots, cinnamon sticks, or chewing gum. Keep them handy so they are available when you have the urge to smoke.  When you have the urge to smoke, try deep breathing.  Designate your home as a nonsmoking area.  If you are a heavy smoker, ask your health care provider about a prescription for nicotine chewing gum. It can ease your withdrawal from nicotine.  Reward yourself. Set aside the cigarette money you save and buy yourself something nice.  Look for support from others. Join a support group or smoking cessation program. Ask someone at home or at work to help you with your plan to quit smoking.  Always ask yourself, "Do I need this cigarette or is this just a reflex?" Tell yourself, "Today, I choose not to smoke," or "I do not want to smoke." You are reminding yourself of your decision to quit.  Do not replace cigarette smoking with electronic cigarettes (commonly called e-cigarettes). The safety of e-cigarettes is unknown, and some may contain harmful chemicals.  If you relapse, do not give up! Plan ahead and think about what you will do the next time you get the urge to smoke. HOW WILL I FEEL WHEN I QUIT SMOKING? You may have symptoms of withdrawal because your body is used to nicotine (the addictive substance in cigarettes). You may crave cigarettes, be  irritable, feel very hungry, cough often, get headaches, or have difficulty concentrating. The withdrawal symptoms are only temporary. They are strongest when you first quit but will go away within 10-14 days. When withdrawal symptoms occur, stay in control. Think about your reasons for quitting. Remind yourself that these are signs that your body is healing and getting used to being without cigarettes. Remember that withdrawal symptoms are easier to treat than the major diseases that smoking can cause.  Even after the withdrawal is over, expect periodic urges to smoke. However, these cravings are generally short lived and will go away whether you smoke or not. Do not smoke! WHAT RESOURCES ARE AVAILABLE TO HELP ME QUIT SMOKING? Your health care provider can direct you to community resources or hospitals for support, which may include:  Group support.  Education.  Hypnosis.  Therapy.   This information is not intended to replace advice given to you by your health care provider. Make sure you discuss any questions you have with your health care provider.   Document Released: 08/05/2004 Document Revised: 11/28/2014 Document Reviewed: 04/25/2013 Elsevier Interactive Patient Education 2016 [employer].   Emergency Department Resource Guide 1) Find a Doctor and Pay Out of Pocket Although you won't have to find out who is covered by your insurance plan, it is a good idea to ask around and get recommendations. You will then need to call the office and see if the doctor you have chosen will accept you as a new patient and what types of options they offer for patients who are self-pay. Some doctors offer discounts or will set up payment plans for their patients who do not have insurance, but you will need to ask so you aren't surprised when you get to your appointment.  2) Contact Your Local Health Department Not all health departments have doctors that can see patients for sick visits, but many do,  so it is worth a call to see if yours does. If you don't know where your local health department is, you can check in your phone book. The CDC also has a tool to help you locate your state's health department, and many state websites also have listings of all of their local health departments.  3) Find a [facility] If your illness is not likely to be very severe or complicated,  you may want to try a walk in clinic. These are popping up all over the country in pharmacies, drugstores, and shopping centers. They're usually staffed by nurse practitioners or physician assistants that have been trained to treat common illnesses and complaints. They're usually fairly quick and inexpensive. However, if you have serious medical issues or chronic medical problems, these are probably not your best option.  No Primary Care Doctor: - Call Health Connect at  [phone] - they can help you locate a primary care doctor that  accepts your insurance, provides certain services, etc. - Physician Referral Service- [phone]  Chronic Pain Problems: Organization         Address  Phone   Notes  [facility]  [phone] Patients need to be referred by their primary care doctor.   Medication Assistance: Organization         Address  Phone   Notes  [facility] Medication [facility] [address] [place], [place] [address] [phone] --Must be a resident of [facility] -- Must have NO insurance coverage whatsoever (no Medicaid/ Medicare, etc.) -- The pt. MUST have a primary care doctor that directs their care regularly and follows them in the community   [facility]  [phone]   [place]  [phone]     Dental Care: Organization         Address  Phone  Notes  [facility] Department of [facility] [phone] Accepts children up to age 67 who are enrolled in [place] or [place]; pregnant women with a Medicaid card; and children who have applied for Medicaid or [place] Health Choice, but were declined, whose parents can pay a reduced fee at time of service.  [facility] Department of [facility]  [address], [place] [phone] Accepts children up to age 70 who are enrolled in [place] or [place]; pregnant women with a Medicaid card; and children who have applied for Medicaid or [place] Health Choice, but were declined, whose parents can pay a reduced fee at time of service.  [place] Adult Dental Access PROGRAM  [place] [phone] Patients are seen by appointment only. Walk-ins are not accepted. [place] will see patients 73 years of age and older. Monday - Tuesday (8am-5pm) Most Wednesdays (8:30-5pm) $30 per visit, cash only  [facility] Adult Dental Access PROGRAM  [address], [facility] [phone] Patients are seen by appointment only. Walk-ins are not accepted. [place] will see patients 52 years of age and older. One Wednesday Evening (Monthly: Volunteer Based).  $30 per visit, cash only  [place]  [phone] for adults; Children under age 11, call Graduate Pediatric Dentistry at [phone]. Children aged 77-14, please call [phone] to request a pediatric application.  Dental services are provided in all areas of dental care including fillings, crowns and bridges, complete and partial dentures, implants, gum treatment, root canals, and extractions. Preventive care is also provided. Treatment is provided to both adults and children. Patients are selected via a lottery and there is often a waiting list.   [facility] [address] [place]  [phone] [email or web address], [place], [place] [phone], Ext. [phone] Second and Fourth Thursday of each month, opens at 6:30 AM; Clinic ends at  9  AM.  Patients are seen on a first-come first-served basis, and a limited number are seen during each clinic.   [facility]  [address] [place] [address] [place], [place] [phone]   Eligibility Requirements You must have lived in [place], [place], or [place] counties for at least the last three months.   You cannot be eligible for state or federal sponsored [employer], including [employer], [place], or [employer].   You generally cannot be eligible for healthcare insurance through your employer.    How to apply: Eligibility screenings are held every Tuesday and Wednesday afternoon from 1:00 pm until 4:00 pm. You do not need an appointment for the interview!  [facility] [address] [place], [place]   [place]  [place]  [place]  [phone]

## 2016-09-29 ENCOUNTER — Ambulatory Visit: Payer: Commercial Managed Care - HMO | Admitting: Family Medicine

## 2016-10-02 ENCOUNTER — Encounter (HOSPITAL_COMMUNITY): Payer: Self-pay

## 2016-10-02 ENCOUNTER — Inpatient Hospital Stay (HOSPITAL_COMMUNITY)
Admission: EM | Admit: 2016-10-02 | Discharge: 2016-10-04 | DRG: 191 | Disposition: A | Discharge status: Home / Self Care | Payer: Commercial Managed Care - HMO | Attending: Family Medicine | Admitting: Family Medicine

## 2016-10-02 ENCOUNTER — Emergency Department (HOSPITAL_COMMUNITY): Payer: Commercial Managed Care - HMO

## 2016-10-02 DIAGNOSIS — Z7952 Long term (current) use of systemic steroids: Secondary | ICD-10-CM

## 2016-10-02 DIAGNOSIS — F17210 Nicotine dependence, cigarettes, uncomplicated: Secondary | ICD-10-CM | POA: Diagnosis not present

## 2016-10-02 DIAGNOSIS — R030 Elevated blood-pressure reading, without diagnosis of hypertension: Secondary | ICD-10-CM | POA: Diagnosis not present

## 2016-10-02 DIAGNOSIS — Z87891 Personal history of nicotine dependence: Secondary | ICD-10-CM | POA: Diagnosis not present

## 2016-10-02 DIAGNOSIS — J9600 Acute respiratory failure, unspecified whether with hypoxia or hypercapnia: Secondary | ICD-10-CM

## 2016-10-02 DIAGNOSIS — Z79899 Other long term (current) drug therapy: Secondary | ICD-10-CM

## 2016-10-02 DIAGNOSIS — J441 Chronic obstructive pulmonary disease with (acute) exacerbation: Secondary | ICD-10-CM | POA: Diagnosis not present

## 2016-10-02 DIAGNOSIS — J410 Simple chronic bronchitis: Secondary | ICD-10-CM

## 2016-10-02 DIAGNOSIS — J9601 Acute respiratory failure with hypoxia: Secondary | ICD-10-CM

## 2016-10-02 DIAGNOSIS — Z23 Encounter for immunization: Secondary | ICD-10-CM | POA: Diagnosis not present

## 2016-10-02 DIAGNOSIS — N179 Acute kidney failure, unspecified: Secondary | ICD-10-CM | POA: Diagnosis not present

## 2016-10-02 DIAGNOSIS — Z7982 Long term (current) use of aspirin: Secondary | ICD-10-CM

## 2016-10-02 DIAGNOSIS — Z720 Tobacco use: Secondary | ICD-10-CM | POA: Diagnosis not present

## 2016-10-02 DIAGNOSIS — I10 Essential (primary) hypertension: Secondary | ICD-10-CM | POA: Diagnosis not present

## 2016-10-02 DIAGNOSIS — R0602 Shortness of breath: Secondary | ICD-10-CM | POA: Diagnosis not present

## 2016-10-02 DIAGNOSIS — J301 Allergic rhinitis due to pollen: Secondary | ICD-10-CM

## 2016-10-02 LAB — CBC WITH DIFFERENTIAL/PLATELET
BASOS ABS: 0 10*3/uL (ref 0.0–0.1)
BASOS PCT: 0 %
EOS PCT: 7 %
Eosinophils Absolute: 0.6 10*3/uL (ref 0.0–0.7)
HEMATOCRIT: 45.2 % (ref 39.0–52.0)
Hemoglobin: 15.1 g/dL (ref 13.0–17.0)
Lymphocytes Relative: 37 %
Lymphs Abs: 3.2 10*3/uL (ref 0.7–4.0)
MCH: 31.9 pg (ref 26.0–34.0)
MCHC: 33.4 g/dL (ref 30.0–36.0)
MCV: 95.6 fL (ref 78.0–100.0)
MONO ABS: 0.9 10*3/uL (ref 0.1–1.0)
MONOS PCT: 10 %
NEUTROS ABS: 4.1 10*3/uL (ref 1.7–7.7)
Neutrophils Relative %: 46 %
PLATELETS: 255 10*3/uL (ref 150–400)
RBC: 4.73 MIL/uL (ref 4.22–5.81)
RDW: 12.3 % (ref 11.5–15.5)
WBC: 8.8 10*3/uL (ref 4.0–10.5)

## 2016-10-02 LAB — BASIC METABOLIC PANEL
ANION GAP: 11 (ref 5–15)
BUN: 24 mg/dL — ABNORMAL HIGH (ref 6–20)
CALCIUM: 9.1 mg/dL (ref 8.9–10.3)
CO2: 18 mmol/L — AB (ref 22–32)
CREATININE: 1.5 mg/dL — AB (ref 0.61–1.24)
Chloride: 108 mmol/L (ref 101–111)
GFR, EST AFRICAN AMERICAN: 56 mL/min — AB (ref >60)
GFR, EST NON AFRICAN AMERICAN: 49 mL/min — AB (ref >60)
GLUCOSE: 110 mg/dL — AB (ref 65–99)
Potassium: 4.8 mmol/L (ref 3.5–5.1)
Sodium: 137 mmol/L (ref 135–145)

## 2016-10-02 LAB — I-STAT TROPONIN, ED: Troponin i, poc: 0 ng/mL (ref 0.00–0.08)

## 2016-10-02 MED ORDER — ENOXAPARIN SODIUM 40 MG/0.4ML ~~LOC~~ SOLN
40.0000 mg | Freq: Every day | SUBCUTANEOUS | Status: DC
  Administered 2016-10-03 – 2016-10-04 (×2): 40 mg via SUBCUTANEOUS
  Filled 2016-10-02 (×2): qty 0.4

## 2016-10-02 MED ORDER — SODIUM CHLORIDE 0.9% FLUSH: 3.0000 mL | INTRAVENOUS | Status: DC | PRN

## 2016-10-02 MED ORDER — METHYLPREDNISOLONE SODIUM SUCC 125 MG IJ SOLR
125.0000 mg | Freq: Once | INTRAMUSCULAR | Status: AC
Start: 2016-10-02 — End: 2016-10-02
  Administered 2016-10-02: 125 mg via INTRAVENOUS
  Filled 2016-10-02: qty 2

## 2016-10-02 MED ORDER — IPRATROPIUM-ALBUTEROL 0.5-2.5 (3) MG/3ML IN SOLN
3.0000 mL | Freq: Four times a day (QID) | RESPIRATORY_TRACT | Status: DC
  Administered 2016-10-03 (×3): 3 mL via RESPIRATORY_TRACT
  Filled 2016-10-02 (×3): qty 3

## 2016-10-02 MED ORDER — ASPIRIN EC 81 MG PO TBEC
81.0000 mg | DELAYED_RELEASE_TABLET | Freq: Every day | ORAL | Status: DC
  Administered 2016-10-03 – 2016-10-04 (×2): 81 mg via ORAL
  Filled 2016-10-02 (×2): qty 1

## 2016-10-02 MED ORDER — SODIUM CHLORIDE 0.9% FLUSH
3.0000 mL | Freq: Two times a day (BID) | INTRAVENOUS | Status: DC
  Administered 2016-10-03 – 2016-10-04 (×3): 3 mL via INTRAVENOUS

## 2016-10-02 MED ORDER — AMLODIPINE BESYLATE 10 MG PO TABS
10.0000 mg | ORAL_TABLET | Freq: Every day | ORAL | Status: DC
  Administered 2016-10-03 – 2016-10-04 (×2): 10 mg via ORAL
  Filled 2016-10-02 (×2): qty 1

## 2016-10-02 MED ORDER — HYDRALAZINE HCL 20 MG/ML IJ SOLN: 5.0000 mg | INTRAMUSCULAR | Status: DC | PRN

## 2016-10-02 MED ORDER — ALBUTEROL SULFATE (2.5 MG/3ML) 0.083% IN NEBU: 2.5000 mg | INHALATION_SOLUTION | RESPIRATORY_TRACT | Status: DC | PRN

## 2016-10-02 MED ORDER — NITROGLYCERIN IN D5W 200-5 MCG/ML-% IV SOLN
5.0000 ug/min | Freq: Once | INTRAVENOUS | Status: AC
  Administered 2016-10-02: 5 ug/min via INTRAVENOUS
  Filled 2016-10-02: qty 250

## 2016-10-02 MED ORDER — ALBUTEROL SULFATE (2.5 MG/3ML) 0.083% IN NEBU
2.5000 mg | INHALATION_SOLUTION | RESPIRATORY_TRACT | Status: DC | PRN
  Administered 2016-10-02: 2.5 mg via RESPIRATORY_TRACT

## 2016-10-02 MED ORDER — ALBUTEROL (5 MG/ML) CONTINUOUS INHALATION SOLN
INHALATION_SOLUTION | RESPIRATORY_TRACT | Status: AC
  Filled 2016-10-02: qty 20

## 2016-10-02 MED ORDER — ALBUTEROL SULFATE (2.5 MG/3ML) 0.083% IN NEBU: 2.5000 mg | INHALATION_SOLUTION | Freq: Four times a day (QID) | RESPIRATORY_TRACT | Status: DC

## 2016-10-02 MED ORDER — ALBUTEROL (5 MG/ML) CONTINUOUS INHALATION SOLN
10.0000 mg/h | INHALATION_SOLUTION | RESPIRATORY_TRACT | Status: DC
  Administered 2016-10-02: 19:00:00 via RESPIRATORY_TRACT
  Filled 2016-10-02: qty 20

## 2016-10-02 MED ORDER — LEVOFLOXACIN IN D5W 750 MG/150ML IV SOLN
750.0000 mg | Freq: Every day | INTRAVENOUS | Status: DC
Start: 2016-10-02 — End: 2016-10-04
  Administered 2016-10-02 – 2016-10-03 (×2): 750 mg via INTRAVENOUS
  Filled 2016-10-02 (×2): qty 150

## 2016-10-02 MED ORDER — SODIUM CHLORIDE 0.9 % IV SOLN: 250.0000 mL | INTRAVENOUS | Status: DC | PRN

## 2016-10-02 MED ORDER — MOMETASONE FURO-FORMOTEROL FUM 200-5 MCG/ACT IN AERO
2.0000 | INHALATION_SPRAY | Freq: Two times a day (BID) | RESPIRATORY_TRACT | Status: DC
  Administered 2016-10-03 – 2016-10-04 (×2): 2 via RESPIRATORY_TRACT
  Filled 2016-10-02 (×2): qty 8.8

## 2016-10-02 NOTE — ED Provider Notes (Addendum)
[facility] DEPT Provider Note   CSN: [id number] Arrival date & time: 10/02/16  1824     History   Chief Complaint Chief Complaint  Patient presents with  . Shortness of Breath    HPI [name] is a 61 y.o. male.  Level V caveat for urgent need for intervention. Patient arrived by EMS very dyspneic. He was given albuterol nebulizer treatment and intravenous steroids en route which seemed to help. Additionally, he is hypertensive. Past medical history includes COPD and hypertension.      Past Medical History:  Diagnosis Date  . Asthma   . COPD (chronic obstructive pulmonary disease) ([place])   . Hypertension   . Shortness of breath     Patient Active Problem List   Diagnosis Date Noted  . History of tobacco use 02/06/2014  . Lightheaded 01/29/2014  . COPD exacerbation ([place]) 10/24/2012  . Lymph node enlargement 12/10/2011  . ERECTILE DYSFUNCTION 12/31/2008  . COPD (chronic obstructive pulmonary disease) ([place]) 12/31/2008  . COLON POLYP 01/18/2007  . HYPERTENSION, BENIGN SYSTEMIC 01/18/2007    Past Surgical History:  Procedure Laterality Date  . COLONOSCOPY         Home Medications    Prior to Admission medications   Medication Sig Start Date End Date Taking? Authorizing Provider  ADVAIR DISKUS 500-50 MCG/DOSE AEPB INHALE ONE OUFF INTO THE LUNGS TWICE A DAY 02/29/16   Frazier [place], MD  albuterol (PROVENTIL) (2.5 MG/3ML) 0.083% nebulizer solution USE 1 VIAL IN NEBULIZER 4 TIMES A DAY AS NEEDED FOR SHORTNESS OF BREATH 11/13/15   Frazier Richards, MD  amLODipine (NORVASC) 10 MG tablet TAKE 1 TABLET BY MOUTH EVERY DAY 04/22/15   Frazier Richards, MD  aspirin 81 MG EC tablet Take 81 mg by mouth daily.      Historical Provider, MD  azithromycin (ZITHROMAX) 250 MG tablet Take 2 tabs day 1, then 1 tab daily 11/24/15   Leone Brand, MD  doxycycline (VIBRA-TABS) 100 MG tablet Take 1 tablet (100 mg total) by mouth 2 (two) times daily. 10/23/15   Frazier Richards, MD    fexofenadine (ALLEGRA) 180 MG tablet Take 1 tablet (180 mg total) by mouth daily. 02/26/14   Frazier Richards, MD  fluticasone (FLONASE) 50 MCG/ACT nasal spray PLACE 2 SPRAYS INTO BOTH NOSTRILS DAILY. 08/14/15   Frazier Richards, MD  hydrochlorothiazide (HYDRODIURIL) 25 MG tablet Take 1 tablet (25 mg total) by mouth daily. 02/26/14   Frazier Richards, MD  HYDROcodone-acetaminophen (NORCO) 5-325 MG tablet Take 1 tablet by mouth every 6 (six) hours as needed for severe pain. 05/29/16   Mercedes Camprubi-Soms, PA-C  HYDROcodone-homatropine (HYCODAN) 5-1.5 MG/5ML syrup Take 5 mLs by mouth every 8 (eight) hours as needed for cough. 11/24/15   Leone Brand, MD  naproxen (NAPROSYN) 500 MG tablet Take 1 tablet (500 mg total) by mouth 2 (two) times daily as needed for mild pain, moderate pain or headache (TAKE WITH MEALS.). 05/29/16   Mercedes Camprubi-Soms, PA-C  penicillin v potassium (VEETID) 500 MG tablet Take 2 tablets (1,000 mg total) by mouth 2 (two) times daily. X 7 days 05/29/16   Mercedes Camprubi-Soms, PA-C  predniSONE (DELTASONE) 50 MG tablet Take 1 tablet (50 mg total) by mouth daily with breakfast. 10/23/15   Frazier [place], MD  PROVENTIL HFA [phone] Base) MCG/ACT inhaler INHALE 2 PUFFS INTO THE LUNGS EVERY 4 HOURS AS NEEDED FOR WHEEZING. 02/29/16   Frazier Richards, MD  zoster  vaccine live, PF, (ZOSTAVAX) 57846 UNT/0.65ML injection Inject 19,400 Units into the skin once. 10/23/15   Frazier Richards, MD    Family History Family History  Problem Relation Age of Onset  . Cancer Father     Social History Social History  Substance Use Topics  . Smoking status: Former Smoker    Packs/day: 0.25    Years: 30.00    Types: Cigarettes    Quit date: 08/21/2013  . Smokeless tobacco: Never Used  . Alcohol use Yes     Comment: rare/occasional     Allergies   Tiotropium bromide monohydrate   Review of Systems Review of Systems  Reason unable to perform ROS: Urgent need for intervention.     Physical Exam Updated  Vital Signs BP (!) 169/106   Pulse 108   Resp 17   SpO2 96%   Physical Exam  Constitutional: He is oriented to person, place, and time.  Tachypneic, dyspneic, hypertensive  HENT:  Head: Normocephalic and atraumatic.  Eyes: Conjunctivae are normal.  Neck: Neck supple.  Cardiovascular: Normal rate and regular rhythm.   Pulmonary/Chest:  Using accessory muscles to respire.  Abdominal: Soft. Bowel sounds are normal.  Musculoskeletal: Normal range of motion.  Neurological: He is alert and oriented to person, place, and time.  Skin: Skin is warm and dry.  Psychiatric: He has a normal mood and affect. His behavior is normal.  Nursing note and vitals reviewed.    ED Treatments / Results  Labs (all labs ordered are listed, but only abnormal results are displayed) Labs Reviewed  BASIC METABOLIC PANEL - Abnormal; Notable for the following:       Result Value   CO2 18 (*)    Glucose, Bld 110 (*)    BUN 24 (*)    Creatinine, Ser 1.50 (*)    GFR calc non Af Amer 49 (*)    GFR calc Af Amer 56 (*)    All other components within normal limits  CBC WITH DIFFERENTIAL/PLATELET  Randolm Idol, ED    EKG  EKG Interpretation None       Radiology Dg Chest Portable 1 View  Result Date: 10/02/2016 CLINICAL DATA:  Acute onset of severe shortness of breath. Initial encounter. EXAM: PORTABLE CHEST 1 VIEW COMPARISON:  Chest radiograph performed 05/04/2013 FINDINGS: The lungs are well-aerated and clear. There is no evidence of focal opacification, pleural effusion or pneumothorax. The cardiomediastinal silhouette is within normal limits. No acute osseous abnormalities are seen. IMPRESSION: No acute cardiopulmonary process seen. Electronically Signed   By: Garald Balding M.D.   On: 10/02/2016 18:57    Procedures Procedures (including critical care time)  Medications Ordered in ED Medications  albuterol (PROVENTIL,VENTOLIN) solution continuous neb ( Nebulization Given 10/02/16 1840)    nitroGLYCERIN 50 mg in dextrose 5 % 250 mL (0.2 mg/mL) infusion (5 mcg/min Intravenous New Bag/Given 10/02/16 1847)     Initial Impression / Assessment and Plan / ED Course  I have reviewed the triage vital signs and the nursing notes.  Pertinent labs & imaging results that were available during my care of the patient were reviewed by me and considered in my medical decision making (see chart for details).  Clinical Course     Patient presented in respiratory extremis. BiPAP was ordered immediately. Nitroglycerin drip initiated for blood pressure control. He seems to be improving over time. Will admit to family medicine teaching service.   CRITICAL CARE Performed by: Nat Christen Total critical care time: 30 minutes  Critical care time was exclusive of separately billable procedures and treating other patients. Critical care was necessary to treat or prevent imminent or life-threatening deterioration. Critical care was time spent personally by me on the following activities: development of treatment plan with patient and/or surrogate as well as nursing, discussions with consultants, evaluation of patient's response to treatment, examination of patient, obtaining history from patient or surrogate, ordering and performing treatments and interventions, ordering and review of laboratory studies, ordering and review of radiographic studies, pulse oximetry and re-evaluation of patient's condition.  Final Clinical Impressions(s) / ED Diagnoses   Final diagnoses:  COPD exacerbation ([place])  Hypertension, unspecified type    New Prescriptions New Prescriptions   No medications on file     Nat Christen, MD 10/02/16 2005    Nat Christen, MD 10/02/16 2042

## 2016-10-02 NOTE — Progress Notes (Signed)
RT has taken BIPAP off patient. Patient is doing better. Patient maintaining 95% on 4L [place] and RR is normal 18. RT will continue to monitor.

## 2016-10-02 NOTE — ED Triage Notes (Signed)
Per EMS - pt new onset shortness of breath today. Did not take any of daily meds. Pt given 10 albuterol, 125mg  solumedrol b/c pt was uncertain if he had allergy to atrovent. Dyspnea significantly worse with exertion, accessory muscle use

## 2016-10-02 NOTE — H&P (Signed)
[facility] Admission History and Physical Service Pager: [phone]  Patient name: [name] Medical record number: [id number] Date of birth: 10/08/1956 Age: 60 y.o. Gender: male  Primary Care Provider: Lovenia Kim, MD Consultants: None Code Status: Full  Chief Complaint: Dyspnea  Assessment and Plan: [name] is a 60 y.o. male presenting with COPD exacerbation. PMH is significant for HTN, COPD, and tobacco use.   #Exacerbation of COPD, triggered by viral URI and smoking. Has had recent URI symptoms over the last few weeks. Endorses worsening dyspnea with increased sputum production, tightness, and wheezing. No fevers. Ran out of medications at home. H/o respiratory distress requiring intubation. Was on Bipap in ED but was able to be weaned down to [place] 4L.  -Admit to stepdown unit given history of requiring intubation in the past. -start Levofloxacin (Levaquin) IV can transition to PO when respiratory status improves -Supplemental oxygen to keep SaO2 > 90%. -will give dose of solumedrol; follow up PO prednisone tomorrow -Albuterol nebs q4 prn with duonebs scheduled q6hr.  -Continue home advair -Smoking cessation encouraged  #Hypertension. Elevated BPs on admission 208/100. Was started on nitro drip in ED; now discontinued. On norvasc 10mg  at home. Self discontinued HCTZ due to frequent urination. Noncompliant with medications. -monitor per floor protocol  -continue home norvasc -hydralazine prn for SBP >200 or DBP >110  #Tobacco use. Every day smoker of 3-4 cigs.  -declines nicotine patch -encourage tobacco cessation  FEN/GI: Regular diet.  Prophylaxis: Lovenox  Disposition: Anticipate transition from SDU to floor bed in 1 day and home in 2-3 days.   History of Present Illness:  [name] is a 60 y.o. male presenting with worsening dyspnea. States that he came down with a cold over the last week or so. Believes this has aggravated  his COPD along with the cooler weather. Patient endorse increased dyspnea, increased sputum, tightness, and wheezing. He denies any fevers. Sputum Is clear colored but thicker than usual. He states he was unable to take any breathing treatments at home because he lost one of his inhalers and the other is out of refills. He still smokes daily.   IN ED patient presented in respiratory distress. Was given breathing treatment and placed on Bipap. Much improved over course in ED and was transitioned to [place]. Patient feeling somewhat better since being in ED.  Review Of Systems: Per HPI with the following additions:   Review of Systems  Constitutional: Negative for fever.  Respiratory: Positive for sputum production, shortness of breath and wheezing.   Cardiovascular: Negative for chest pain and leg swelling.  Gastrointestinal: Negative for nausea and vomiting.  Neurological: Negative for weakness.    Patient Active Problem List   Diagnosis Date Noted  . History of tobacco use 02/06/2014  . Lightheaded 01/29/2014  . COPD exacerbation ([place]) 10/24/2012  . Lymph node enlargement 12/10/2011  . ERECTILE DYSFUNCTION 12/31/2008  . COPD (chronic obstructive pulmonary disease) ([place]) 12/31/2008  . COLON POLYP 01/18/2007  . HYPERTENSION, BENIGN SYSTEMIC 01/18/2007    Past Medical History: Past Medical History:  Diagnosis Date  . Asthma   . COPD (chronic obstructive pulmonary disease) ([place])   . Hypertension   . Shortness of breath     Past Surgical History: Past Surgical History:  Procedure Laterality Date  . COLONOSCOPY      Social History: Social History  Substance Use Topics  . Smoking status: Former Smoker    Packs/day: 0.25    Years: 30.00  Types: Cigarettes    Quit date: 08/21/2013  . Smokeless tobacco: Never Used  . Alcohol use Yes     Comment: rare/occasional   Additional social history: Lives with wife Please also refer to relevant sections of EMR.  Family  History: Family History  Problem Relation Age of Onset  . Cancer Father     Allergies and Medications: Allergies  Allergen Reactions  . Tiotropium Bromide Monohydrate Other (See Comments)    Hallucinations (reaction to Spiriva)   No current facility-administered medications on file prior to encounter.    Current Outpatient Prescriptions on File Prior to Encounter  Medication Sig Dispense Refill  . ADVAIR DISKUS 500-50 MCG/DOSE AEPB INHALE ONE OUFF INTO THE LUNGS TWICE A DAY (Patient taking differently: INHALE ONE PUFF INTO THE LUNGS TWICE A DAY) 60 each 11  . albuterol (PROVENTIL) (2.5 MG/3ML) 0.083% nebulizer solution USE 1 VIAL IN NEBULIZER 4 TIMES A DAY AS NEEDED FOR SHORTNESS OF BREATH (Patient taking differently: Take 2.5 mg by nebulization See admin instructions. Inhale 1 vial via nebulizer every morning and at night, may also use 2 additional times during the day as needed for shortness of breath) 375 mL 5  . amLODipine (NORVASC) 10 MG tablet TAKE 1 TABLET BY MOUTH EVERY DAY 90 tablet 3  . aspirin 81 MG EC tablet Take 81 mg by mouth daily.      Marland Kitchen PROVENTIL HFA 108 (90 Base) MCG/ACT inhaler INHALE 2 PUFFS INTO THE LUNGS EVERY 4 HOURS AS NEEDED FOR WHEEZING. 6.7 Inhaler 2  . fexofenadine (ALLEGRA) 180 MG tablet Take 1 tablet (180 mg total) by mouth daily. (Patient not taking: Reported on 10/02/2016) 90 tablet 3  . fluticasone (FLONASE) 50 MCG/ACT nasal spray PLACE 2 SPRAYS INTO BOTH NOSTRILS DAILY. (Patient not taking: Reported on 10/02/2016) 16 g 5  . hydrochlorothiazide (HYDRODIURIL) 25 MG tablet Take 1 tablet (25 mg total) by mouth daily. (Patient not taking: Reported on 10/02/2016) 90 tablet 3  . HYDROcodone-acetaminophen (NORCO) 5-325 MG tablet Take 1 tablet by mouth every 6 (six) hours as needed for severe pain. (Patient not taking: Reported on 10/02/2016) 6 tablet 0  . HYDROcodone-homatropine (HYCODAN) 5-1.5 MG/5ML syrup Take 5 mLs by mouth every 8 (eight) hours as needed for  cough. (Patient not taking: Reported on 10/02/2016) 120 mL 0  . naproxen (NAPROSYN) 500 MG tablet Take 1 tablet (500 mg total) by mouth 2 (two) times daily as needed for mild pain, moderate pain or headache (TAKE WITH MEALS.). (Patient not taking: Reported on 10/02/2016) 20 tablet 0    Objective: BP (!) 178/105   Pulse 103   Resp 17   SpO2 96%  Exam: General: alert, well-developed, NAD, cooperative, lying in bed comfortbaly HEENT: NCAT, vision grossly intact, PERRLA, no injection and anicteric. EOMI. MMM, oral mucosa and oropharynx reveal no lesions or exudates. Nasal canula in place Neck: supple, full ROM, no thyromegaly. No deformities, masses, or tenderness noted.  Lungs: Diffuse inspiratory and expiratory wheezing, poor air movement, no crackles, normal respiratory effort,  Heart: RRR, no M/R/G. Pulses intact Abdomen: Bowel sounds normal; abdomen soft and nontender. Non-distended. no guarding, no rebound tenderness Extremities: No cyanosis, clubbing, edema Neurologic: No focal deficits, CN grossly intact,+5 strength globally, sensation grossly intact, A&Ox3. Skin: Intact without suspicious lesions or rashes. Warm and dry. Psych: Mood and affect are normal  Labs and Imaging: Results for orders placed or performed during the hospital encounter of 10/02/16 (from the past 24 hour(s))  CBC with Differential  Status: None   Collection Time: 10/02/16  6:40 PM  Result Value Ref Range   WBC 8.8 4.0 - 10.5 K/uL   RBC 4.73 4.22 - 5.81 MIL/uL   Hemoglobin 15.1 13.0 - 17.0 g/dL   HCT 45.2 39.0 - 52.0 %   MCV 95.6 78.0 - 100.0 fL   MCH 31.9 26.0 - 34.0 pg   MCHC 33.4 30.0 - 36.0 g/dL   RDW 12.3 11.5 - 15.5 %   Platelets 255 150 - 400 K/uL   Neutrophils Relative % 46 %   Neutro Abs 4.1 1.7 - 7.7 K/uL   Lymphocytes Relative 37 %   Lymphs Abs 3.2 0.7 - 4.0 K/uL   Monocytes Relative 10 %   Monocytes Absolute 0.9 0.1 - 1.0 K/uL   Eosinophils Relative 7 %   Eosinophils Absolute 0.6 0.0 -  0.7 K/uL   Basophils Relative 0 %   Basophils Absolute 0.0 0.0 - 0.1 K/uL  Basic metabolic panel     Status: Abnormal   Collection Time: 10/02/16  6:40 PM  Result Value Ref Range   Sodium 137 135 - 145 mmol/L   Potassium 4.8 3.5 - 5.1 mmol/L   Chloride 108 101 - 111 mmol/L   CO2 18 (L) 22 - 32 mmol/L   Glucose, Bld 110 (H) 65 - 99 mg/dL   BUN 24 (H) 6 - 20 mg/dL   Creatinine, Ser 1.50 (H) 0.61 - 1.24 mg/dL   Calcium 9.1 8.9 - 10.3 mg/dL   GFR calc non Af Amer 49 (L) >60 mL/min   GFR calc Af Amer 56 (L) >60 mL/min   Anion gap 11 5 - 15  I-stat troponin, ED     Status: None   Collection Time: 10/02/16  6:47 PM  Result Value Ref Range   Troponin i, poc 0.00 0.00 - 0.08 ng/mL   Comment 3           Dg Chest Portable 1 View  Result Date: 10/02/2016 CLINICAL DATA:  Acute onset of severe shortness of breath. Initial encounter. EXAM: PORTABLE CHEST 1 VIEW COMPARISON:  Chest radiograph performed 05/04/2013 FINDINGS: The lungs are well-aerated and clear. There is no evidence of focal opacification, pleural effusion or pneumothorax. The cardiomediastinal silhouette is within normal limits. No acute osseous abnormalities are seen. IMPRESSION: No acute cardiopulmonary process seen. Electronically Signed   By: Garald Balding M.D.   On: 10/02/2016 18:57    Katheren Shams, DO 10/02/2016, 8:40 PM PGY-3, [place] Intern pager: [phone], text pages welcome

## 2016-10-02 NOTE — Progress Notes (Deleted)
ABG results: 7.330, CO2 48.2, PAO2 65, HCO3 25.4, SO2 91

## 2016-10-02 NOTE — Progress Notes (Signed)
RT came to assess patient. Patient requested to go back on BIPAP. He felt a little tight. RT also gave PRN ned treatment. Patient tolerated well.

## 2016-10-02 NOTE — Progress Notes (Signed)
Patient arrived via EMS as severe shortness of breath.  Placed patient on bipap per MD order and started hour long nebulizer treatment.  Patient is currently tolerating well.  Will continue to monitor.

## 2016-10-02 NOTE — ED Notes (Signed)
Attempted report x2. Spoke w/ receiving RN who stated she is in a pt room @ the moment and would call this RN back.

## 2016-10-02 NOTE — ED Notes (Signed)
Attempted report x1. Nurse unable to take report at this time.

## 2016-10-02 NOTE — Progress Notes (Signed)
RT transported patient to 4E from ED without any complications.

## 2016-10-03 DIAGNOSIS — I10 Essential (primary) hypertension: Secondary | ICD-10-CM

## 2016-10-03 DIAGNOSIS — J9601 Acute respiratory failure with hypoxia: Secondary | ICD-10-CM

## 2016-10-03 DIAGNOSIS — J9600 Acute respiratory failure, unspecified whether with hypoxia or hypercapnia: Secondary | ICD-10-CM

## 2016-10-03 LAB — CBC
HCT: 42.6 % (ref 39.0–52.0)
Hemoglobin: 14.2 g/dL (ref 13.0–17.0)
MCH: 32.1 pg (ref 26.0–34.0)
MCHC: 33.3 g/dL (ref 30.0–36.0)
MCV: 96.2 fL (ref 78.0–100.0)
Platelets: 239 10*3/uL (ref 150–400)
RBC: 4.43 MIL/uL (ref 4.22–5.81)
RDW: 12.6 % (ref 11.5–15.5)
WBC: 5.3 10*3/uL (ref 4.0–10.5)

## 2016-10-03 LAB — COMPREHENSIVE METABOLIC PANEL
ALBUMIN: 4 g/dL (ref 3.5–5.0)
ALK PHOS: 66 U/L (ref 38–126)
ALT: 24 U/L (ref 17–63)
AST: 24 U/L (ref 15–41)
Anion gap: 9 (ref 5–15)
BILIRUBIN TOTAL: 0.5 mg/dL (ref 0.3–1.2)
BUN: 18 mg/dL (ref 6–20)
CALCIUM: 9.2 mg/dL (ref 8.9–10.3)
CO2: 22 mmol/L (ref 22–32)
Chloride: 106 mmol/L (ref 101–111)
Creatinine, Ser: 1.34 mg/dL — ABNORMAL HIGH (ref 0.61–1.24)
GFR calc Af Amer: >60 mL/min (ref >60)
GFR calc non Af Amer: 56 mL/min — ABNORMAL LOW (ref >60)
GLUCOSE: 273 mg/dL — AB (ref 65–99)
Potassium: 4.7 mmol/L (ref 3.5–5.1)
Sodium: 137 mmol/L (ref 135–145)
TOTAL PROTEIN: 7.3 g/dL (ref 6.5–8.1)

## 2016-10-03 LAB — GLUCOSE, CAPILLARY: GLUCOSE-CAPILLARY: 166 mg/dL — AB (ref 65–99)

## 2016-10-03 LAB — MRSA PCR SCREENING: MRSA by PCR: NEGATIVE

## 2016-10-03 MED ORDER — PREDNISONE 50 MG PO TABS: 50.0000 mg | ORAL_TABLET | Freq: Every day | ORAL | Status: DC

## 2016-10-03 MED ORDER — IPRATROPIUM-ALBUTEROL 0.5-2.5 (3) MG/3ML IN SOLN
3.0000 mL | RESPIRATORY_TRACT | Status: DC
  Administered 2016-10-03 – 2016-10-04 (×5): 3 mL via RESPIRATORY_TRACT
  Filled 2016-10-03 (×5): qty 3

## 2016-10-03 MED ORDER — TIOTROPIUM BROMIDE MONOHYDRATE 18 MCG IN CAPS
18.0000 ug | ORAL_CAPSULE | Freq: Once | RESPIRATORY_TRACT | Status: DC
  Filled 2016-10-03: qty 5

## 2016-10-03 MED ORDER — SODIUM CHLORIDE 0.9 % IV SOLN
INTRAVENOUS | Status: DC
  Administered 2016-10-03 – 2016-10-04 (×4): via INTRAVENOUS

## 2016-10-03 MED ORDER — ORAL CARE MOUTH RINSE
15.0000 mL | Freq: Two times a day (BID) | OROMUCOSAL | Status: DC
  Administered 2016-10-03 – 2016-10-04 (×3): 15 mL via OROMUCOSAL

## 2016-10-03 MED ORDER — TIOTROPIUM BROMIDE MONOHYDRATE 18 MCG IN CAPS
18.0000 ug | ORAL_CAPSULE | Freq: Every day | RESPIRATORY_TRACT | Status: DC
  Filled 2016-10-03: qty 5

## 2016-10-03 MED ORDER — TIOTROPIUM BROMIDE MONOHYDRATE 18 MCG IN CAPS
18.0000 ug | ORAL_CAPSULE | Freq: Once | RESPIRATORY_TRACT | Status: AC
  Administered 2016-10-04: 18 ug via RESPIRATORY_TRACT
  Filled 2016-10-03: qty 5

## 2016-10-03 MED ORDER — ALBUTEROL SULFATE (2.5 MG/3ML) 0.083% IN NEBU: 2.5000 mg | INHALATION_SOLUTION | RESPIRATORY_TRACT | Status: DC | PRN

## 2016-10-03 MED ORDER — ACETAMINOPHEN 325 MG PO TABS: 650.0000 mg | ORAL_TABLET | Freq: Four times a day (QID) | ORAL | Status: DC | PRN

## 2016-10-03 MED ORDER — METHYLPREDNISOLONE SODIUM SUCC 125 MG IJ SOLR
125.0000 mg | Freq: Two times a day (BID) | INTRAMUSCULAR | Status: DC
  Administered 2016-10-03 – 2016-10-04 (×2): 125 mg via INTRAVENOUS
  Filled 2016-10-03 (×2): qty 2

## 2016-10-03 NOTE — Progress Notes (Signed)
Called by Respiratory Therapy and RN that Pt is having increased work of breathing off BiPAP. On my exam, he is sitting up on the side of the bed, hunched over, and appearing to be more dyspneic than earlier this morning. He was becoming short of breath at the end of a long sentence, but was conversing and mentating appropriately. He was receiving a duoneb treatment. He sounded more tight than my previous exam, with poor air movement and diffuse inspiratory and expiratory wheezing. Pt was on 2L O2 by [place] before starting his duoneb treatment. -Will increase Duoneb frequency from every 6 hours to every 4 hours -Will increase Albuterol frequency from every 4 hours prn to every 2 hours prn -Will order Solumedrol 125mg  bid for today. Re-assess in the AM. -Keep in stepdown unit for today -If he worsens, will likely need to escalate to BiPAP. -I will evaluate him later today  Hyman Bible, MD PGY-2

## 2016-10-03 NOTE — Progress Notes (Signed)
Pt placed back on bipap due to WOB and SOB.  RT monitoring.

## 2016-10-03 NOTE — Progress Notes (Signed)
Family Medicine Teaching Service Daily Progress Note Intern Pager: [phone]  Patient name: [name] Medical record number: [id number] Date of birth: Dec 13, 1955 Age: 60 y.o. Gender: male  Primary Care Provider: Lovenia Kim, MD Consultants: None Code Status: Full  Pt Overview and Major Events to Date:  11/12: Admit to stepdown for COPD exacerbation  Assessment and Plan: [name] is a 60 y.o. male presenting with COPD exacerbation. PMH is significant for HTN, COPD, and tobacco use.   #Exacerbation of COPD, triggered by viral URI, smoking, running out of inhalers. Feeling much better this morning. On BiPAP overnight, ready to transition to [place]. On exam, still with inspiratory and expiratory wheezing throughout all lung fields and decreased air movement. Able to speak in full sentences. -Transition from BiPAP to [place] this morning. -On IV Levaquin (Day 2). Transition to PO Azithromycin if stable off BiPAP. -On IV Solumedrol. Transition to PO Prednisone if stable off BiPAP. -Supplemental oxygen to keep SaO2 > 90%. -Continue scheduled duonebs q6hrs today because he is still wheezy with decreased air movement. Continue Albuterol nebs q4hrs prn.  -On Advair at home, [facility] while hospitalized -Incentive spirometry -Smoking cessation encouraged -Will make sure patient gets refills of inhalers and BP meds on discharge -Transition out of stepdown later today if stable off BIPAP.  #Hypertension. Had run out of norvasc 10mg  and had self-discontinued HCTZ, so off all BP meds on admission. Elevated BPs on admission 208/100. BPs have been labile overnight, ranging from 126/62-179/107. -Pt has not yet gotten Norvasc 10mg  this admission, monitor BPs after he receives it this morning. -hydralazine prn for SBP >200 or DBP >110. Has not received any doses. -Anticipate Pt will likely need a second BP medication. Would ideally like to start ACE.  #Elevated Cr: ?AKI vs worsening chronic kidney  disease 2/2 uncontrolled HTN. Cr 1.50 on admission > 1.34 this morning. Previous values 1.0-1.2, but he does not have any values since 2014. -IVFs at 16ml/hr -Avoid nephrotoxic agents -Daily BMETs  #Tobacco use. Every day smoker of 3-4 cigs.  -declines nicotine patch -encourage tobacco cessation  FEN/GI: Regular diet.  Prophylaxis: Lovenox  Disposition: Will likely transfer out to the regular floor today. Anticipate discharge home in 1-2 days.  Subjective:  Pt states he is feeling much better this morning. He feels like his breathing has greatly improved. He was on BiPAP overnight and states he thinks he is ready to transition to [place].  Objective: Temp:  [96.6 F (35.9 C)-96.8 F (36 C)] 96.6 F (35.9 C) (11/13 0747) Pulse Rate:  [73-116] 74 (11/13 0600) Resp:  [13-28] 14 (11/13 0600) BP: (126-208)/(62-107) 169/99 (11/13 0747) SpO2:  [94 %-100 %] 100 % (11/13 0600) FiO2 (%):  [40 %] 40 % (11/13 0400) Weight:  [133 lb 13.1 oz (60.7 kg)] 133 lb 13.1 oz (60.7 kg) (11/13 0000) Physical Exam: General: alert, well-developed, NAD, talkative, pleasant HEENT: NCAT, EOMI, BiPAP in place Neck: supple, full ROM  Lungs: Diffuse inspiratory and expiratory wheezing, decreased air movement, able to speak in full sentences. Heart: RRR, no M/R/G. Pulses intact Abdomen: Bowel sounds normal; abdomen soft and nontender. Non-distended. no guarding, no rebound tenderness Extremities: No cyanosis, no edema, warm and well-perfused Neurologic: Awake, alert, oriented.  Laboratory:  Recent Labs Lab 10/02/16 1840 10/03/16 0227  WBC 8.8 5.3  HGB 15.1 14.2  HCT 45.2 42.6  PLT 255 239    Recent Labs Lab 10/02/16 1840 10/03/16 0227  NA 137 137  K 4.8 4.7  CL 108  106  CO2 18* 22  BUN 24* 18  CREATININE 1.50* 1.34*  CALCIUM 9.1 9.2  PROT  --  7.3  BILITOT  --  0.5  ALKPHOS  --  66  ALT  --  24  AST  --  24  GLUCOSE 110* 273*   I-stat trop neg  Imaging/Diagnostic Tests: CXR  (11/12): No acute cardiopulmonary processes  Sela Hua, MD 10/03/2016, 8:52 AM PGY-2, [place] Intern pager: [phone], text pages welcome

## 2016-10-03 NOTE — Progress Notes (Signed)
Decreased fio2 to 3L [place] due to stable sats

## 2016-10-04 ENCOUNTER — Other Ambulatory Visit: Payer: Self-pay | Admitting: *Deleted

## 2016-10-04 DIAGNOSIS — J410 Simple chronic bronchitis: Secondary | ICD-10-CM

## 2016-10-04 DIAGNOSIS — I10 Essential (primary) hypertension: Secondary | ICD-10-CM

## 2016-10-04 DIAGNOSIS — J441 Chronic obstructive pulmonary disease with (acute) exacerbation: Principal | ICD-10-CM

## 2016-10-04 LAB — BASIC METABOLIC PANEL
ANION GAP: 7 (ref 5–15)
BUN: 13 mg/dL (ref 6–20)
CALCIUM: 9.3 mg/dL (ref 8.9–10.3)
CHLORIDE: 110 mmol/L (ref 101–111)
CO2: 22 mmol/L (ref 22–32)
CREATININE: 1.17 mg/dL (ref 0.61–1.24)
GFR calc non Af Amer: >60 mL/min (ref >60)
Glucose, Bld: 131 mg/dL — ABNORMAL HIGH (ref 65–99)
Potassium: 3.9 mmol/L (ref 3.5–5.1)
SODIUM: 139 mmol/L (ref 135–145)

## 2016-10-04 LAB — CBC
HEMATOCRIT: 40.4 % (ref 39.0–52.0)
HEMOGLOBIN: 13.5 g/dL (ref 13.0–17.0)
MCH: 31.8 pg (ref 26.0–34.0)
MCHC: 33.4 g/dL (ref 30.0–36.0)
MCV: 95.1 fL (ref 78.0–100.0)
Platelets: 259 10*3/uL (ref 150–400)
RBC: 4.25 MIL/uL (ref 4.22–5.81)
RDW: 12.3 % (ref 11.5–15.5)
WBC: 11.8 10*3/uL — AB (ref 4.0–10.5)

## 2016-10-04 MED ORDER — ASPIRIN 81 MG PO TBEC: 81.0000 mg | DELAYED_RELEASE_TABLET | Freq: Every day | ORAL | 2 refills | Status: DC

## 2016-10-04 MED ORDER — AMLODIPINE BESYLATE 5 MG PO TABS: 5.0000 mg | ORAL_TABLET | Freq: Every day | ORAL | 3 refills | Status: DC

## 2016-10-04 MED ORDER — FLUTICASONE-SALMETEROL 500-50 MCG/DOSE IN AEPB: INHALATION_SPRAY | RESPIRATORY_TRACT | 3 refills | Status: DC

## 2016-10-04 MED ORDER — ALBUTEROL SULFATE HFA 108 (90 BASE) MCG/ACT IN AERS: INHALATION_SPRAY | RESPIRATORY_TRACT | 2 refills | Status: DC

## 2016-10-04 MED ORDER — AMLODIPINE BESYLATE 5 MG PO TABS: 5.0000 mg | ORAL_TABLET | Freq: Every day | ORAL | 5 refills | Status: DC

## 2016-10-04 MED ORDER — PREDNISONE 50 MG PO TABS: 50.0000 mg | ORAL_TABLET | Freq: Every day | ORAL | 0 refills | Status: DC

## 2016-10-04 MED ORDER — ASPIRIN 81 MG PO TBEC: 81.0000 mg | DELAYED_RELEASE_TABLET | Freq: Every day | ORAL | 5 refills | Status: DC

## 2016-10-04 MED ORDER — AMLODIPINE BESYLATE 5 MG PO TABS: 10.0000 mg | ORAL_TABLET | Freq: Every day | ORAL | 5 refills | Status: DC

## 2016-10-04 MED ORDER — AZITHROMYCIN 250 MG PO TABS: 250.0000 mg | ORAL_TABLET | Freq: Every day | ORAL | 0 refills | Status: DC

## 2016-10-04 MED ORDER — AZITHROMYCIN 500 MG PO TABS
250.0000 mg | ORAL_TABLET | Freq: Every day | ORAL | Status: DC
  Administered 2016-10-04: 250 mg via ORAL
  Filled 2016-10-04: qty 1

## 2016-10-04 MED ORDER — IPRATROPIUM-ALBUTEROL 0.5-2.5 (3) MG/3ML IN SOLN: 3.0000 mL | Freq: Four times a day (QID) | RESPIRATORY_TRACT | Status: DC | PRN

## 2016-10-04 MED ORDER — FLUTICASONE-SALMETEROL 500-50 MCG/DOSE IN AEPB: INHALATION_SPRAY | RESPIRATORY_TRACT | 5 refills | Status: DC

## 2016-10-04 MED ORDER — DOXYCYCLINE HYCLATE 100 MG PO TABS: 100.0000 mg | ORAL_TABLET | Freq: Two times a day (BID) | ORAL | Status: DC

## 2016-10-04 MED ORDER — LISINOPRIL 5 MG PO TABS: 5.0000 mg | ORAL_TABLET | Freq: Every day | ORAL | 3 refills | Status: DC

## 2016-10-04 MED ORDER — ALBUTEROL SULFATE (2.5 MG/3ML) 0.083% IN NEBU: INHALATION_SOLUTION | RESPIRATORY_TRACT | 3 refills | Status: DC

## 2016-10-04 MED ORDER — PREDNISONE 50 MG PO TABS
50.0000 mg | ORAL_TABLET | Freq: Every day | ORAL | Status: DC
  Administered 2016-10-04: 50 mg via ORAL
  Filled 2016-10-04: qty 1

## 2016-10-04 MED ORDER — FLUTICASONE PROPIONATE 50 MCG/ACT NA SUSP: NASAL | 3 refills | Status: DC

## 2016-10-04 MED ORDER — TIOTROPIUM BROMIDE MONOHYDRATE 18 MCG IN CAPS: 18.0000 ug | ORAL_CAPSULE | Freq: Every day | RESPIRATORY_TRACT | 4 refills | Status: DC

## 2016-10-04 MED ORDER — TIOTROPIUM BROMIDE MONOHYDRATE 18 MCG IN CAPS
18.0000 ug | ORAL_CAPSULE | Freq: Every day | RESPIRATORY_TRACT | Status: DC
  Filled 2016-10-04: qty 5

## 2016-10-04 MED ORDER — IPRATROPIUM-ALBUTEROL 0.5-2.5 (3) MG/3ML IN SOLN: 3.0000 mL | RESPIRATORY_TRACT | Status: DC | PRN

## 2016-10-04 MED ORDER — TIOTROPIUM BROMIDE MONOHYDRATE 18 MCG IN CAPS: 18.0000 ug | ORAL_CAPSULE | Freq: Every day | RESPIRATORY_TRACT | 5 refills | Status: DC

## 2016-10-04 MED ORDER — LISINOPRIL 5 MG PO TABS
5.0000 mg | ORAL_TABLET | Freq: Every day | ORAL | Status: DC
  Administered 2016-10-04: 5 mg via ORAL
  Filled 2016-10-04: qty 2

## 2016-10-04 MED ORDER — ALBUTEROL SULFATE HFA 108 (90 BASE) MCG/ACT IN AERS: INHALATION_SPRAY | RESPIRATORY_TRACT | 3 refills | Status: DC

## 2016-10-04 MED ORDER — LISINOPRIL 5 MG PO TABS: 5.0000 mg | ORAL_TABLET | Freq: Every day | ORAL | 5 refills | Status: DC

## 2016-10-04 MED ORDER — ALBUTEROL SULFATE (2.5 MG/3ML) 0.083% IN NEBU: INHALATION_SOLUTION | RESPIRATORY_TRACT | 2 refills | Status: DC

## 2016-10-04 NOTE — Progress Notes (Signed)
10/04/2016 Ambulate 250 twice on room air saturation was 100% Rico Sheehan RN

## 2016-10-04 NOTE — Discharge Summary (Signed)
[facility] Discharge Summary  Patient name: [name] Medical record number: [id number] Date of birth: Dec 20, 1955 Age: 60 y.o. Gender: male Date of Admission: 10/02/2016  Date of Discharge: 10/04/2016 Admitting Physician: Leeanne Rio, MD  Primary Care Provider: Lovenia Kim, MD Consultants: None  Indication for Hospitalization: COPD Exacerbation  Discharge Diagnoses/Problem List:  COPD Exacerbation Hypertension Tobacco Abuse  Disposition: Home  Discharge Condition: Stable, improved  Discharge Exam:  General: alert, well-developed, NAD, talkative, pleasant HEENT: NCAT, EOMI, on RA Neck: supple, full ROM  Lungs: Mild diffuse inspiratory and expiratory wheezing, great air movement throughout, able to speak in full sentences Heart: RRR, no M/R/G. Pulses intact Abdomen: Bowel sounds normal; abdomen soft and nontender.Non-distended. no guarding, no rebound tenderness Extremities: No cyanosis, no edema, warm and well-perfused Neurologic: Awake, alert, oriented.  Brief Hospital Course:  [name] a 60 y.o.malewho presented with COPD exacerbation. PMH is significant for HTN, COPD, and tobacco use.   Patient presented to ED with dyspnea, wheeze, and productive cough 2/2 to viral cold which triggered a COPD exacerbation. Patient was initially placed on BiPAP. CXR was neg. Patient received Levaquin and was transitioned to azithromycin for a total of 5 days. Solumedrol was given and patient switched to prednisone for a total of 5 days prior to d/c. Patient was given Spiriva during admission which he responded well to and was continued upon d/c. In addition, he was placed on advair for maximal control of COPD.  Issues for Follow Up:  1. We discharged patient on three medications for COPD- Albuterol, Spiriva, and Advair. 2. We also discharged him on Norvasc 5mg  daily and Lisinopril 5mg  daily. Pt stated that 10mg  of Norvasc makes him  dizzy at home. 3. Pt was discharged on 2 additional days of Azithromycin and Prednisone to finish 5 day courses of each. Please make sure he took these medications. 4. Pt indicated in the hospital that he wanted to stop smoking. Please continue smoking cessation counseling on discharge.  Significant Procedures: None  Significant Labs and Imaging:   Recent Labs Lab 10/02/16 1840 10/03/16 0227 10/04/16 0236  WBC 8.8 5.3 11.8*  HGB 15.1 14.2 13.5  HCT 45.2 42.6 40.4  PLT 255 239 259    Recent Labs Lab 10/02/16 1840 10/03/16 0227 10/04/16 0236  NA 137 137 139  K 4.8 4.7 3.9  CL 108 106 110  CO2 18* 22 22  GLUCOSE 110* 273* 131*  BUN 24* 18 13  CREATININE 1.50* 1.34* 1.17  CALCIUM 9.1 9.2 9.3  ALKPHOS  --  66  --   AST  --  24  --   ALT  --  24  --   ALBUMIN  --  4.0  --    DG Chest Portable 1 View (11/12):  FINDINGS: The lungs are well-aerated and clear. There is no evidence of focal opacification, pleural effusion or pneumothorax. The cardiomediastinal silhouette is within normal limits. No acute osseous abnormalities are seen.  IMPRESSION: No acute cardiopulmonary process seen.  Results/Tests Pending at Time of Discharge: None  Discharge Medications:    Medication List    STOP taking these medications   hydrochlorothiazide 25 MG tablet Commonly known as:  HYDRODIURIL   HYDROcodone-acetaminophen 5-325 MG tablet Commonly known as:  NORCO   HYDROcodone-homatropine 5-1.5 MG/5ML syrup Commonly known as:  HYCODAN   naproxen 500 MG tablet Commonly known as:  NAPROSYN     TAKE these medications   albuterol 108 (90 Base) MCG/ACT  inhaler Commonly known as:  PROVENTIL HFA INHALE 2 PUFFS INTO THE LUNGS EVERY 4 HOURS AS NEEDED FOR WHEEZING. What changed:  See the new instructions.   albuterol (2.5 MG/3ML) 0.083% nebulizer solution Commonly known as:  PROVENTIL USE 1 VIAL IN NEBULIZER 4 TIMES A DAY AS NEEDED FOR SHORTNESS OF BREATH What changed:  how much  to take  how to take this  when to take this  additional instructions   amLODipine 5 MG tablet Commonly known as:  NORVASC Take 1 tablet (5 mg total) by mouth daily. What changed:  See the new instructions.   aspirin 81 MG EC tablet Take 1 tablet (81 mg total) by mouth daily.   azithromycin 250 MG tablet Commonly known as:  ZITHROMAX Take 1 tablet (250 mg total) by mouth daily.   diphenhydrAMINE 25 MG tablet Commonly known as:  BENADRYL Take 25 mg by mouth daily.   fexofenadine 180 MG tablet Commonly known as:  ALLEGRA Take 1 tablet (180 mg total) by mouth daily.   fluticasone 50 MCG/ACT nasal spray Commonly known as:  FLONASE PLACE 2 SPRAYS INTO BOTH NOSTRILS DAILY. What changed:  See the new instructions.   Fluticasone-Salmeterol 500-50 MCG/DOSE Aepb Commonly known as:  ADVAIR DISKUS INHALE ONE OUFF INTO THE LUNGS TWICE A DAY What changed:  See the new instructions.   lisinopril 5 MG tablet Commonly known as:  PRINIVIL,ZESTRIL Take 1 tablet (5 mg total) by mouth daily.   MUCINEX MAXIMUM STRENGTH 1200 MG Tb12 Generic drug:  Guaifenesin Take 2,400 mg by mouth once.   predniSONE 50 MG tablet Commonly known as:  DELTASONE Take 1 tablet (50 mg total) by mouth daily with breakfast.   tiotropium 18 MCG inhalation capsule Commonly known as:  SPIRIVA Place 1 capsule (18 mcg total) into inhaler and inhale daily.       Discharge Instructions: Please refer to Patient Instructions section of EMR for full details.  Patient was counseled important signs and symptoms that should prompt return to medical care, changes in medications, dietary instructions, activity restrictions, and follow up appointments.   Follow-Up Appointments: Follow-up Information    Zebulon Bing, DO Follow up on 10/11/2016.   Why:  hospital follow-up appointment at 2:00PM Contact information: [place] [id number] [phone]           Wayland Bing,  DO 10/05/2016, 9:59 PM PGY-1, [place]

## 2016-10-04 NOTE — Discharge Instructions (Signed)
It was so nice to meet you!  You were hospitalized because you had an exacerbation (or worsening) of your COPD. We think this likely happened because you ran out of your medications.  We have sent you home on three inhalers for your COPD: 1. Albuterol- this is an AS NEEDED medication and should be used up to 4 times daily when you are feeling short of breath 2. Spiriva- this is a controller medication that should be used EVERY DAY, even when you feel like you are breathing normally 3. Advair- this is a controller medication that should be used TWICE A DAY EVERY DAY, even when you are breathing normally  We have also sent you home on Prednisone (a steroid) and Azithromycin (an antibiotic) to help with this COPD exacerbation. You should take 1 tablet of Prednisone and 1 tablet of Azithromycin tomorrow (11/15) and then next day (11/16).  Your blood pressure was really high when you came in. We made some changes to your blood pressure medications. 1. STOP taking hydrochlorothiazide. You said you didn't like this medication because it made you pee a lot. 2. Take Norvasc 5mg  daily. 3. Take Lisinopril 5mg  daily. This medication will also help protect your kidneys.

## 2016-10-04 NOTE — Telephone Encounter (Signed)
Tawanna Sat,  You forwarded me a message re: Ruan Atondo but I'm unsure what needed to be done on my end. Please let me know thanks!

## 2016-10-04 NOTE — Care Management Note (Signed)
Case Management Note  Patient Details  Name: NORFLEET HITCHINS MRN: [id number] Date of Birth: 03/18/1956  Subjective/Objective:                 Independent patient from home admitted with COPD, Patient has nebulizer at home. No CM needs identified at this time. Anticipate DC to home, self care today.    Action/Plan:   Expected Discharge Date:                  Expected Discharge Plan:  Home/Self Care  In-House Referral:  NA  Discharge planning Services  CM Consult  Post Acute Care Choice:    Choice offered to:     DME Arranged:    DME Agency:     HH Arranged:    HH Agency:     Status of Service:  Completed, signed off  If discussed at [employer] of Stay Meetings, dates discussed:    Additional Comments:  Carles Collet, RN 10/04/2016, 2:22 PM

## 2016-10-04 NOTE — Progress Notes (Signed)
Transitions of Care Pharmacy Note  Plan:  Educated patient on all new medications, to include: lisinopril, azithromycin, spiriva, and prednisone; as well as unchanged continued home medications.  Addressed concerns regarding amlodipine dosing and patient reported history of dizziness.   Recommend reducing amlodipine dose to 5mg  daily.   --------------------------------------------- [name] is an 60 y.o. male who presents with a chief complaint of shortness of breath and exacerbation of COPD. In anticipation of discharge, pharmacy has reviewed this patient's prior to admission medication history, as well as current inpatient medications listed per the [facility].  Current medication indications, dosing, frequency, and notable side effects reviewed with patient and family. Patient and family verbalized understanding of current inpatient medication regimen and is aware that the After Visit Summary when presented, will represent the most accurate medication list at discharge.   [name] expressed concerns regarding amlodipine dosing of 10mg  daily when he was taking 5mg  daily PTA. Family medicine provider contacted and changed discharge dose to amlodipine 5mg  daily.   Assessment: Understanding of regimen: excellent Understanding of indications: excellent Potential of compliance: good Barriers to Obtaining Medications: No  Patient instructed to contact inpatient pharmacy team with further questions or concerns if needed.    Time spent preparing for discharge counseling: 15 min  Time spent counseling patient: 20 min    Thank you for allowing pharmacy to be a part of this patient's care.  Argie Ramming, PharmD Pharmacy Resident  Pager [phone] 10/04/16 4:42 PM

## 2016-10-04 NOTE — Progress Notes (Signed)
Family Medicine Teaching Service Daily Progress Note Intern Pager: [phone]  Patient name: [name] Medical record number: [id number] Date of birth: Jun 16, 1956 Age: 60 y.o. Gender: male  Primary Care Provider: Lovenia Kim, MD Consultants: None Code Status: Full  Pt Overview and Major Events to Date:  11/12: Admit to stepdown for COPD exacerbation 11/13: Transitioned to RA 11/14: IV meds transitioned to PO, transitioned out of stepdown to med-surg  Assessment and Plan: [name] is a 60 y.o. male presenting with COPD exacerbation. PMH is significant for HTN, COPD, and tobacco use.   #COPD Exacerbation, Multifactorial: Viral URI, Smoking, Running out of Inhalers: Transitioned off [place] on 11/13. On exam, still with inspiratory and expiratory wheezing throughout all lung fields and great air movement. No increased work of breathing. Appears comfortable. Able to speak in full sentences without stopping or taking deep breaths. --On IV Levaquin, will transition to PO Azithromycin 250 mg QD for 3 more days (Day 3 of 5) --On IV Solumedrol, will transition to PO Prednisone 50 mg QD (Day 3 of 5) --Supplemental oxygen to keep SaO2 > 90%. --Continue scheduled duonebs q6hrs --Spacing out Albuterol nebs q6hrs --On Advair at home, [facility] while hospitalized --Continue Spiriva QD, will need to continue this on d/c --Incentive spirometry --Smoking cessation encouraged --Will make sure patient gets refills of inhalers and BP meds on discharge --Will transition out of stepdown today  #Hypertension: Had run out of norvasc 10 mg and had self-discontinued HCTZ, therefore w/o BP meds on admission. Elevated BPs on admission 208/100. BPs have been 150-160/80 overnight. --Decreasing to home dose Norvasc 5 mg QD --Hydralazine prn for SBP >200 or DBP >110 (not yet received) --Will add Lisinopril 5 mg today, will need to continue this on d/c  #Acute Kidney Injury, Resolved: Cr 1.50 on  admission>1.17 this morning. Baseline 1.0-1.2. --IVFs at 94ml/hr --Avoid nephrotoxic agents --Daily BMETs --Will start ACE-I today  #Tobacco Abuse, Chronic, Uncontrolled: Every day smoker of 3-4 cigs.  --Declines nicotine patch --Encourage tobacco cessation  FEN/GI: Regular diet.  Prophylaxis: Lovenox SQ  Disposition: Transition IV meds to PO and monitor for imporvement, anticipate d/c today this afternoon  Subjective:  Pt states he is feeling much better this morning. Slept through not on RA. He feels like his breathing has greatly improved. He thinks he is ready to transition out of stepdown and is hopeful about leaving soon.  Objective: Temp:  [96.6 F (35.9 C)-98.5 F (36.9 C)] 97.7 F (36.5 C) (11/14 0450) Pulse Rate:  [92-111] 97 (11/14 0450) Resp:  [15-23] 15 (11/14 0450) BP: (136-187)/(75-110) 168/84 (11/14 0450) SpO2:  [94 %-100 %] 94 % (11/14 0450) Physical Exam: General: alert, well-developed, NAD, talkative, pleasant HEENT: NCAT, EOMI, on RA Neck: supple, full ROM  Lungs: Mild diffuse inspiratory and expiratory wheezing, great air movement throughout, able to speak in full sentences Heart: RRR, no M/R/G. Pulses intact Abdomen: Bowel sounds normal; abdomen soft and nontender. Non-distended. no guarding, no rebound tenderness Extremities: No cyanosis, no edema, warm and well-perfused Neurologic: Awake, alert, oriented.  Laboratory:  Recent Labs Lab 10/02/16 1840 10/03/16 0227 10/04/16 0236  WBC 8.8 5.3 11.8*  HGB 15.1 14.2 13.5  HCT 45.2 42.6 40.4  PLT 255 239 259    Recent Labs Lab 10/02/16 1840 10/03/16 0227 10/04/16 0236  NA 137 137 139  K 4.8 4.7 3.9  CL 108 106 110  CO2 18* 22 22  BUN 24* 18 13  CREATININE 1.50* 1.34* 1.17  CALCIUM 9.1 9.2  9.3  PROT  --  7.3  --   BILITOT  --  0.5  --   ALKPHOS  --  66  --   ALT  --  24  --   AST  --  24  --   GLUCOSE 110* 273* 131*   I-stat trop neg  Imaging/Diagnostic Tests: DG Chest  Portable 1 View (11/12):  FINDINGS: The lungs are well-aerated and clear. There is no evidence of focal opacification, pleural effusion or pneumothorax. The cardiomediastinal silhouette is within normal limits. No acute osseous abnormalities are seen.  IMPRESSION: No acute cardiopulmonary process seen.  Montverde Bing, DO 10/04/2016, 7:14 AM PGY-1, [place] Intern pager: [phone], text pages welcome

## 2016-10-04 NOTE — Progress Notes (Signed)
Discharge instructions reviewed with pt. Pt verbalized understanding. IVs removed. Pt in stable condition. Pt refused wheelchair and walked out with family.

## 2016-10-04 NOTE — Progress Notes (Signed)
Pt transferred from [facility] in stable condition. Will continue to monitor

## 2016-10-11 ENCOUNTER — Inpatient Hospital Stay: Payer: Commercial Managed Care - HMO | Admitting: Family Medicine

## 2016-10-12 ENCOUNTER — Inpatient Hospital Stay: Payer: Commercial Managed Care - HMO | Admitting: Internal Medicine

## 2016-12-19 ENCOUNTER — Encounter (HOSPITAL_COMMUNITY): Payer: Self-pay | Admitting: *Deleted

## 2016-12-19 ENCOUNTER — Emergency Department (HOSPITAL_COMMUNITY)
Admission: EM | Admit: 2016-12-19 | Discharge: 2016-12-19 | Disposition: A | Discharge status: Home / Self Care | Payer: Medicare HMO | Attending: Emergency Medicine | Admitting: Emergency Medicine

## 2016-12-19 ENCOUNTER — Emergency Department (HOSPITAL_COMMUNITY): Payer: Medicare HMO

## 2016-12-19 DIAGNOSIS — Z87891 Personal history of nicotine dependence: Secondary | ICD-10-CM | POA: Diagnosis not present

## 2016-12-19 DIAGNOSIS — J441 Chronic obstructive pulmonary disease with (acute) exacerbation: Secondary | ICD-10-CM | POA: Diagnosis not present

## 2016-12-19 DIAGNOSIS — R0602 Shortness of breath: Secondary | ICD-10-CM | POA: Diagnosis not present

## 2016-12-19 DIAGNOSIS — Z79899 Other long term (current) drug therapy: Secondary | ICD-10-CM | POA: Diagnosis not present

## 2016-12-19 DIAGNOSIS — Z7982 Long term (current) use of aspirin: Secondary | ICD-10-CM | POA: Insufficient documentation

## 2016-12-19 DIAGNOSIS — I10 Essential (primary) hypertension: Secondary | ICD-10-CM | POA: Diagnosis not present

## 2016-12-19 LAB — BASIC METABOLIC PANEL
ANION GAP: 10 (ref 5–15)
BUN: 19 mg/dL (ref 6–20)
CO2: 18 mmol/L — ABNORMAL LOW (ref 22–32)
Calcium: 9.6 mg/dL (ref 8.9–10.3)
Chloride: 113 mmol/L — ABNORMAL HIGH (ref 101–111)
Creatinine, Ser: 1.4 mg/dL — ABNORMAL HIGH (ref 0.61–1.24)
GFR calc Af Amer: >60 mL/min (ref >60)
GFR, EST NON AFRICAN AMERICAN: 52 mL/min — AB (ref >60)
GLUCOSE: 86 mg/dL (ref 65–99)
POTASSIUM: 3.7 mmol/L (ref 3.5–5.1)
Sodium: 141 mmol/L (ref 135–145)

## 2016-12-19 LAB — CBC
HEMATOCRIT: 41.8 % (ref 39.0–52.0)
HEMOGLOBIN: 14.2 g/dL (ref 13.0–17.0)
MCH: 32.6 pg (ref 26.0–34.0)
MCHC: 34 g/dL (ref 30.0–36.0)
MCV: 96.1 fL (ref 78.0–100.0)
Platelets: 274 10*3/uL (ref 150–400)
RBC: 4.35 MIL/uL (ref 4.22–5.81)
RDW: 12.1 % (ref 11.5–15.5)
WBC: 7.8 10*3/uL (ref 4.0–10.5)

## 2016-12-19 MED ORDER — IPRATROPIUM-ALBUTEROL 0.5-2.5 (3) MG/3ML IN SOLN
3.0000 mL | Freq: Once | RESPIRATORY_TRACT | Status: DC
  Filled 2016-12-19: qty 3

## 2016-12-19 MED ORDER — PREDNISONE 20 MG PO TABS: 60.0000 mg | ORAL_TABLET | Freq: Every day | ORAL | 0 refills | Status: DC

## 2016-12-19 MED ORDER — ALBUTEROL SULFATE HFA 108 (90 BASE) MCG/ACT IN AERS
2.0000 | INHALATION_SPRAY | RESPIRATORY_TRACT | Status: DC | PRN
  Administered 2016-12-19: 2 via RESPIRATORY_TRACT
  Filled 2016-12-19: qty 6.7

## 2016-12-19 MED ORDER — DOXYCYCLINE HYCLATE 100 MG PO CAPS: 100.0000 mg | ORAL_CAPSULE | Freq: Two times a day (BID) | ORAL | 0 refills | Status: DC

## 2016-12-19 MED ORDER — PREDNISONE 20 MG PO TABS
60.0000 mg | ORAL_TABLET | Freq: Once | ORAL | Status: AC
  Administered 2016-12-19: 60 mg via ORAL
  Filled 2016-12-19: qty 3

## 2016-12-19 MED ORDER — ALBUTEROL SULFATE (2.5 MG/3ML) 0.083% IN NEBU
5.0000 mg | INHALATION_SOLUTION | Freq: Once | RESPIRATORY_TRACT | Status: AC
  Administered 2016-12-19: 5 mg via RESPIRATORY_TRACT
  Filled 2016-12-19: qty 6

## 2016-12-19 NOTE — ED Notes (Signed)
Pt refusing additional neb tx. Pt given extra education on MDI and spacer usage.

## 2016-12-19 NOTE — ED Notes (Signed)
Neb tx started.  

## 2016-12-19 NOTE — ED Provider Notes (Signed)
[place] DEPT Provider Note   CSN: [id number] Arrival date & time: 12/19/16  0302 By signing my name below, I, Georgette Shell, attest that this documentation has been prepared under the direction and in the presence of Orpah Greek, MD. Electronically Signed: Georgette Shell, ED Scribe. 12/19/16. 3:49 AM.  History   Chief Complaint Chief Complaint  Patient presents with  . Shortness of Breath    HPI The history is provided by the patient. No language interpreter was used.   HPI Comments: [name] is a 61 y.o. male with h/o asthma, tobacco abuse, COPD, and HTN, who presents to the Emergency Department complaining of shortness of breath onset one week ago, worsening just PTA. He has not tried any OTC medications PTA. States he has been out of his medications for approximately one week. Pt notes he has been getting over a recent cold. Denies fever, chills, or any other associated symptoms.   Past Medical History:  Diagnosis Date  . Asthma   . COPD (chronic obstructive pulmonary disease) ([place])   . Hypertension   . Shortness of breath     Patient Active Problem List   Diagnosis Date Noted  . Hypertension   . Acute respiratory failure with hypoxia ([place])   . History of tobacco use 02/06/2014  . Lightheaded 01/29/2014  . COPD exacerbation ([place]) 10/24/2012  . Lymph node enlargement 12/10/2011  . Tobacco abuse 08/04/2009  . ERECTILE DYSFUNCTION 12/31/2008  . COPD (chronic obstructive pulmonary disease) ([place]) 12/31/2008  . COLON POLYP 01/18/2007  . HYPERTENSION, BENIGN SYSTEMIC 01/18/2007    Past Surgical History:  Procedure Laterality Date  . COLONOSCOPY         Home Medications    Prior to Admission medications   Medication Sig Start Date End Date Taking? Authorizing Provider  albuterol (PROVENTIL HFA) 108 (90 Base) MCG/ACT inhaler INHALE 2 PUFFS INTO THE LUNGS EVERY 4 HOURS AS NEEDED FOR WHEEZING. 10/04/16  Yes Sela Hua, MD  albuterol (PROVENTIL) (2.5  MG/3ML) 0.083% nebulizer solution USE 1 VIAL IN NEBULIZER 4 TIMES A DAY AS NEEDED FOR SHORTNESS OF BREATH 10/04/16  Yes Sela Hua, MD  amLODipine (NORVASC) 5 MG tablet Take 1 tablet (5 mg total) by mouth daily. 10/04/16  Yes Sela Hua, MD  aspirin 81 MG EC tablet Take 1 tablet (81 mg total) by mouth daily. 10/04/16  Yes Sela Hua, MD  fexofenadine (ALLEGRA) 180 MG tablet Take 1 tablet (180 mg total) by mouth daily. 02/26/14  Yes Frazier Richards, MD  fluticasone (FLONASE) 50 MCG/ACT nasal spray PLACE 2 SPRAYS INTO BOTH NOSTRILS DAILY. 10/04/16  Yes Sela Hua, MD  Fluticasone-Salmeterol (ADVAIR DISKUS) 500-50 MCG/DOSE AEPB INHALE ONE OUFF INTO THE LUNGS TWICE A DAY 10/04/16  Yes Sela Hua, MD  lisinopril (PRINIVIL,ZESTRIL) 5 MG tablet Take 1 tablet (5 mg total) by mouth daily. 10/05/16  Yes Sela Hua, MD  tiotropium (SPIRIVA) 18 MCG inhalation capsule Place 1 capsule (18 mcg total) into inhaler and inhale daily. 10/05/16  Yes Sela Hua, MD  doxycycline (VIBRAMYCIN) 100 MG capsule Take 1 capsule (100 mg total) by mouth 2 (two) times daily. 12/19/16   Orpah Greek, MD  predniSONE (DELTASONE) 20 MG tablet Take 3 tablets (60 mg total) by mouth daily with breakfast. 12/19/16   Orpah Greek, MD    Family History Family History  Problem Relation Age of Onset  . Cancer Father     Social History Social  History  Substance Use Topics  . Smoking status: Former Smoker    Packs/day: 0.25    Years: 30.00    Types: Cigarettes    Quit date: 08/21/2013  . Smokeless tobacco: Never Used  . Alcohol use Yes     Comment: rare/occasional     Allergies   Patient has no known allergies.   Review of Systems Review of Systems  Constitutional: Negative for chills and fever.  Respiratory: Positive for shortness of breath.   All other systems reviewed and are negative.    Physical Exam Updated Vital Signs BP 160/98   Pulse 100   SpO2 99%   Physical Exam    Constitutional: He is oriented to person, place, and time. He appears well-developed and well-nourished. No distress.  HENT:  Head: Normocephalic and atraumatic.  Right Ear: Hearing normal.  Left Ear: Hearing normal.  Nose: Nose normal.  Mouth/Throat: Oropharynx is clear and moist and mucous membranes are normal.  Eyes: Conjunctivae and EOM are normal. Pupils are equal, round, and reactive to light.  Neck: Normal range of motion. Neck supple.  Cardiovascular: Regular rhythm, S1 normal and S2 normal.  Exam reveals no gallop and no friction rub.   No murmur heard. Pulmonary/Chest: Effort normal. No respiratory distress. He has decreased breath sounds. He has wheezes. He exhibits no tenderness.  Diminished breath sounds with wheezing.  Abdominal: Soft. Normal appearance and bowel sounds are normal. There is no hepatosplenomegaly. There is no tenderness. There is no rebound, no guarding, no tenderness at McBurney's point and negative Murphy's sign. No hernia.  Musculoskeletal: Normal range of motion.  Neurological: He is alert and oriented to person, place, and time. He has normal strength. No cranial nerve deficit or sensory deficit. Coordination normal. GCS eye subscore is 4. GCS verbal subscore is 5. GCS motor subscore is 6.  Skin: Skin is warm, dry and intact. No rash noted. No cyanosis.  Psychiatric: He has a normal mood and affect. His speech is normal and behavior is normal. Thought content normal.  Nursing note and vitals reviewed.    ED Treatments / Results  DIAGNOSTIC STUDIES: Oxygen Saturation is 100% with face mask, normal by my interpretation.    COORDINATION OF CARE: 3:48 AM Discussed treatment plan with pt at bedside which includes CXR and breathing treatment and pt agreed to plan.  Labs (all labs ordered are listed, but only abnormal results are displayed) Labs Reviewed  BASIC METABOLIC PANEL - Abnormal; Notable for the following:       Result Value   Chloride 113 (*)     CO2 18 (*)    Creatinine, Ser 1.40 (*)    GFR calc non Af Amer 52 (*)    All other components within normal limits  CBC    EKG  EKG Interpretation  Date/Time:  Monday December 19 2016 03:11:06 EST Ventricular Rate:  111 PR Interval:  150 QRS Duration: 102 QT Interval:  360 QTC Calculation: 489 R Axis:   63 Text Interpretation:  Sinus tachycardia Otherwise normal ECG No significant change since last tracing Confirmed by POLLINA  MD, Harrell Gave [id number]) on 12/19/2016 3:20:26 AM Also confirmed by Betsey Holiday  MD, Ruch [id number]), editor [place]  CCT, BEVER[place] ([id number])  on 12/19/2016 7:06:23 AM       Radiology Dg Chest Portable 1 View  Result Date: 12/19/2016 CLINICAL DATA:  Initial evaluation for acute shortness of breath. History of COPD. EXAM: PORTABLE CHEST 1 VIEW COMPARISON:  Prior radiograph from 10/02/2016. FINDINGS:  The cardiac and mediastinal silhouettes are stable in size and contour, and remain within normal limits. The lungs are normally inflated. No airspace consolidation, pleural effusion, or pulmonary edema is identified. There is no pneumothorax. No acute osseous abnormality identified. IMPRESSION: No radiographic evidence for active cardiopulmonary disease. Electronically Signed   By: Jeannine Boga M.D.   On: 12/19/2016 03:59    Procedures Procedures (including critical care time)  Medications Ordered in ED Medications  ipratropium-albuterol (DUONEB) 0.5-2.5 (3) MG/3ML nebulizer solution 3 mL (not administered)  albuterol (PROVENTIL HFA;VENTOLIN HFA) 108 (90 Base) MCG/ACT inhaler 2 puff (2 puffs Inhalation Given 12/19/16 0500)  albuterol (PROVENTIL) (2.5 MG/3ML) 0.083% nebulizer solution 5 mg (5 mg Nebulization Given 12/19/16 0342)  predniSONE (DELTASONE) tablet 60 mg (60 mg Oral Given 12/19/16 0437)     Initial Impression / Assessment and Plan / ED Course  I have reviewed the triage vital signs and the nursing notes.  Pertinent labs & imaging results that  were available during my care of the patient were reviewed by me and considered in my medical decision making (see chart for details).     Patient presents with difficulty breathing. He reports that he has had some cold symptoms this week and tonight became short of breath with wheezing. He does have a history of COPD, has been out of all of his medications. Patient treated with bronchodilator therapy here in the ER with significant improvement. He'll be maintained on prednisone, albuterol, empiric antibiotic coverage. Follow-up with primary doctor.  Final Clinical Impressions(s) / ED Diagnoses   Final diagnoses:  COPD exacerbation ([place])    New Prescriptions Discharge Medication List as of 12/19/2016  4:49 AM    START taking these medications   Details  doxycycline (VIBRAMYCIN) 100 MG capsule Take 1 capsule (100 mg total) by mouth 2 (two) times daily., Starting Mon 12/19/2016, Print       I personally performed the services described in this documentation, which was scribed in my presence. The recorded information has been reviewed and is accurate.     Orpah Greek, MD 12/19/16 [phone]

## 2016-12-19 NOTE — ED Triage Notes (Signed)
The pt is c/i sob for one week  He has had a recent cold also he has runout of his meds  Hx copd

## 2017-02-06 ENCOUNTER — Telehealth: Payer: Self-pay | Admitting: *Deleted

## 2017-02-06 ENCOUNTER — Other Ambulatory Visit: Payer: Self-pay | Admitting: Family Medicine

## 2017-02-06 MED ORDER — ALBUTEROL SULFATE HFA 108 (90 BASE) MCG/ACT IN AERS: 2.0000 | INHALATION_SPRAY | Freq: Four times a day (QID) | RESPIRATORY_TRACT | 2 refills | Status: DC | PRN

## 2017-02-06 NOTE — Telephone Encounter (Signed)
Prior Authorization received from [facility] pharmacy for Proventil [facility] inhaler. Formulary preferred in [place]  [facility] inhaler. Please change Rx to [place] HFA.  Derl Barrow, RN

## 2017-02-06 NOTE — Telephone Encounter (Signed)
Ventolin HFA sent into pharmacy. Please notify pt ready for pickup. Thanks. -- Harriet [place]te, [place], PGY-1

## 2017-02-20 ENCOUNTER — Other Ambulatory Visit: Payer: Self-pay | Admitting: *Deleted

## 2017-02-20 DIAGNOSIS — I10 Essential (primary) hypertension: Secondary | ICD-10-CM

## 2017-02-20 MED ORDER — AMLODIPINE BESYLATE 5 MG PO TABS: 5.0000 mg | ORAL_TABLET | Freq: Every day | ORAL | 3 refills | Status: DC

## 2017-02-20 MED ORDER — LISINOPRIL 5 MG PO TABS: 5.0000 mg | ORAL_TABLET | Freq: Every day | ORAL | 3 refills | Status: DC

## 2017-04-29 ENCOUNTER — Encounter (HOSPITAL_COMMUNITY): Payer: Self-pay | Admitting: Emergency Medicine

## 2017-04-29 ENCOUNTER — Emergency Department (HOSPITAL_COMMUNITY): Payer: Medicare HMO

## 2017-04-29 DIAGNOSIS — F17210 Nicotine dependence, cigarettes, uncomplicated: Secondary | ICD-10-CM | POA: Diagnosis not present

## 2017-04-29 DIAGNOSIS — J441 Chronic obstructive pulmonary disease with (acute) exacerbation: Secondary | ICD-10-CM | POA: Diagnosis not present

## 2017-04-29 DIAGNOSIS — Z79899 Other long term (current) drug therapy: Secondary | ICD-10-CM | POA: Insufficient documentation

## 2017-04-29 DIAGNOSIS — I10 Essential (primary) hypertension: Secondary | ICD-10-CM | POA: Insufficient documentation

## 2017-04-29 DIAGNOSIS — R05 Cough: Secondary | ICD-10-CM | POA: Diagnosis not present

## 2017-04-29 DIAGNOSIS — Z7982 Long term (current) use of aspirin: Secondary | ICD-10-CM | POA: Insufficient documentation

## 2017-04-29 DIAGNOSIS — R0602 Shortness of breath: Secondary | ICD-10-CM | POA: Diagnosis not present

## 2017-04-29 DIAGNOSIS — J45909 Unspecified asthma, uncomplicated: Secondary | ICD-10-CM | POA: Diagnosis not present

## 2017-04-29 LAB — CBC WITH DIFFERENTIAL/PLATELET
BASOS ABS: 0 10*3/uL (ref 0.0–0.1)
Basophils Relative: 1 %
Eosinophils Absolute: 0.8 10*3/uL — ABNORMAL HIGH (ref 0.0–0.7)
Eosinophils Relative: 13 %
HEMATOCRIT: 41.2 % (ref 39.0–52.0)
HEMOGLOBIN: 13.7 g/dL (ref 13.0–17.0)
LYMPHS PCT: 47 %
Lymphs Abs: 2.8 10*3/uL (ref 0.7–4.0)
MCH: 32.2 pg (ref 26.0–34.0)
MCHC: 33.3 g/dL (ref 30.0–36.0)
MCV: 96.9 fL (ref 78.0–100.0)
Monocytes Absolute: 0.4 10*3/uL (ref 0.1–1.0)
Monocytes Relative: 7 %
NEUTROS ABS: 1.9 10*3/uL (ref 1.7–7.7)
Neutrophils Relative %: 32 %
PLATELETS: 259 10*3/uL (ref 150–400)
RBC: 4.25 MIL/uL (ref 4.22–5.81)
RDW: 12.5 % (ref 11.5–15.5)
WBC: 5.9 10*3/uL (ref 4.0–10.5)

## 2017-04-29 LAB — BASIC METABOLIC PANEL
ANION GAP: 7 (ref 5–15)
BUN: 21 mg/dL — ABNORMAL HIGH (ref 6–20)
CO2: 18 mmol/L — AB (ref 22–32)
Calcium: 9.2 mg/dL (ref 8.9–10.3)
Chloride: 111 mmol/L (ref 101–111)
Creatinine, Ser: 1.26 mg/dL — ABNORMAL HIGH (ref 0.61–1.24)
GFR calc Af Amer: >60 mL/min (ref >60)
GFR, EST NON AFRICAN AMERICAN: 59 mL/min — AB (ref >60)
GLUCOSE: 91 mg/dL (ref 65–99)
Potassium: 4 mmol/L (ref 3.5–5.1)
Sodium: 136 mmol/L (ref 135–145)

## 2017-04-29 MED ORDER — ALBUTEROL SULFATE (2.5 MG/3ML) 0.083% IN NEBU
5.0000 mg | INHALATION_SOLUTION | Freq: Once | RESPIRATORY_TRACT | Status: AC
  Administered 2017-04-29: 5 mg via RESPIRATORY_TRACT

## 2017-04-29 MED ORDER — ALBUTEROL SULFATE (2.5 MG/3ML) 0.083% IN NEBU
INHALATION_SOLUTION | RESPIRATORY_TRACT | Status: AC
  Filled 2017-04-29: qty 6

## 2017-04-29 NOTE — ED Triage Notes (Signed)
Pt states he is been sick with a cold for the past few days and he is treating himself with Musinex, but he still getting SOB all the time, denies any pain. Hx of COPD.

## 2017-04-29 NOTE — ED Notes (Signed)
Pt becoming verbally aggressive for the long wait and states he is very SOB, pt reevaluated on triage and breathing treatment given.

## 2017-04-30 ENCOUNTER — Emergency Department (HOSPITAL_COMMUNITY)
Admission: EM | Admit: 2017-04-30 | Discharge: 2017-04-30 | Disposition: A | Discharge status: Home / Self Care | Payer: Medicare HMO | Attending: Emergency Medicine | Admitting: Emergency Medicine

## 2017-04-30 DIAGNOSIS — J441 Chronic obstructive pulmonary disease with (acute) exacerbation: Secondary | ICD-10-CM

## 2017-04-30 MED ORDER — PREDNISONE 20 MG PO TABS
60.0000 mg | ORAL_TABLET | Freq: Once | ORAL | Status: AC
  Administered 2017-04-30: 60 mg via ORAL
  Filled 2017-04-30: qty 3

## 2017-04-30 MED ORDER — ALBUTEROL SULFATE HFA 108 (90 BASE) MCG/ACT IN AERS
2.0000 | INHALATION_SPRAY | Freq: Once | RESPIRATORY_TRACT | Status: AC
  Administered 2017-04-30: 2 via RESPIRATORY_TRACT
  Filled 2017-04-30: qty 6.7

## 2017-04-30 MED ORDER — PREDNISONE 20 MG PO TABS: 40.0000 mg | ORAL_TABLET | Freq: Every day | ORAL | 0 refills | Status: DC

## 2017-04-30 MED ORDER — DOXYCYCLINE HYCLATE 100 MG PO CAPS: 100.0000 mg | ORAL_CAPSULE | Freq: Two times a day (BID) | ORAL | 0 refills | Status: DC

## 2017-04-30 MED ORDER — IPRATROPIUM-ALBUTEROL 0.5-2.5 (3) MG/3ML IN SOLN
3.0000 mL | Freq: Once | RESPIRATORY_TRACT | Status: AC
  Administered 2017-04-30: 3 mL via RESPIRATORY_TRACT
  Filled 2017-04-30: qty 3

## 2017-04-30 NOTE — ED Provider Notes (Signed)
[facility] DEPT Provider Note   CSN: [id number] Arrival date & time: 04/29/17  2033     History   Chief Complaint Chief Complaint  Patient presents with  . Shortness of Breath    HPI [name] is a 61 y.o. male.  HPI   Patient is a 61 year old male with history of COPD, hypertension and asthma who presents the ED with complaint of shortness of breath. Patient reports over the past 4 days he began to develop a cold with associated nasal congestion, intermittent productive cough, chest congestion, chest tightness and wheezing. He states over the past 2 days he began having worsening shortness of breath. Patient reports using his neb treatments at home without relief. Denies fever, chills, headache, hemoptysis, chest pain, abdominal pain, nausea, vomiting, leg swelling. Patient denies any known sick contacts. Denies any recent hospitalizations. Denies any recent use of antibiotics or steroids. He endorses continuing to smoke one to 2 cigarettes every other day.  Past Medical History:  Diagnosis Date  . Asthma   . COPD (chronic obstructive pulmonary disease) ([place])   . Hypertension   . Shortness of breath     Patient Active Problem List   Diagnosis Date Noted  . Hypertension   . Acute respiratory failure with hypoxia ([place])   . History of tobacco use 02/06/2014  . Lightheaded 01/29/2014  . COPD exacerbation ([place]) 10/24/2012  . Lymph node enlargement 12/10/2011  . Tobacco abuse 08/04/2009  . ERECTILE DYSFUNCTION 12/31/2008  . COPD (chronic obstructive pulmonary disease) ([place]) 12/31/2008  . COLON POLYP 01/18/2007  . HYPERTENSION, BENIGN SYSTEMIC 01/18/2007    Past Surgical History:  Procedure Laterality Date  . COLONOSCOPY         Home Medications    Prior to Admission medications   Medication Sig Start Date End Date Taking? Authorizing Provider  albuterol (PROVENTIL HFA;VENTOLIN HFA) 108 (90 Base) MCG/ACT inhaler Inhale 2 puffs into the lungs every 6 (six)  hours as needed for wheezing or shortness of breath. 02/06/17   [place] Bing, DO  albuterol (PROVENTIL) (2.5 MG/3ML) 0.083% nebulizer solution USE 1 VIAL IN NEBULIZER 4 TIMES A DAY AS NEEDED FOR SHORTNESS OF BREATH 10/04/16   Mayo, Pete Pelt, MD  amLODipine (NORVASC) 5 MG tablet Take 1 tablet (5 mg total) by mouth daily. 02/20/17   [place] Bing, DO  aspirin 81 MG EC tablet Take 1 tablet (81 mg total) by mouth daily. 10/04/16   Mayo, Pete Pelt, MD  doxycycline (VIBRAMYCIN) 100 MG capsule Take 1 capsule (100 mg total) by mouth 2 (two) times daily. 04/30/17   Nona Dell, PA-C  fexofenadine (ALLEGRA) 180 MG tablet Take 1 tablet (180 mg total) by mouth daily. 02/26/14   Frazier Richards, MD  fluticasone (FLONASE) 50 MCG/ACT nasal spray PLACE 2 SPRAYS INTO BOTH NOSTRILS DAILY. 10/04/16   Mayo, Pete Pelt, MD  Fluticasone-Salmeterol (ADVAIR DISKUS) 500-50 MCG/DOSE AEPB INHALE ONE OUFF INTO THE LUNGS TWICE A DAY 10/04/16   Mayo, Pete Pelt, MD  lisinopril (PRINIVIL,ZESTRIL) 5 MG tablet Take 1 tablet (5 mg total) by mouth daily. 02/20/17   [place] Bing, DO  predniSONE (DELTASONE) 20 MG tablet Take 2 tablets (40 mg total) by mouth daily. 04/30/17   Nona Dell, PA-C  tiotropium (SPIRIVA) 18 MCG inhalation capsule Place 1 capsule (18 mcg total) into inhaler and inhale daily. 10/05/16   Mayo, Pete Pelt, MD    Family History Family History  Problem Relation Age of Onset  .  Cancer Father     Social History Social History  Substance Use Topics  . Smoking status: Current Every Day Smoker    Packs/day: 0.50    Years: 30.00    Types: Cigarettes    Last attempt to quit: 08/21/2013  . Smokeless tobacco: Never Used  . Alcohol use Yes     Comment: rare/occasional     Allergies   Patient has no known allergies.   Review of Systems Review of Systems  HENT: Positive for congestion.   Respiratory: Positive for cough, chest tightness, shortness of breath and wheezing.   All  other systems reviewed and are negative.    Physical Exam Updated Vital Signs BP (!) 197/98   Pulse 88   Temp 98.1 F (36.7 C) (Oral)   Resp 19   Ht 5\' 9"  (1.753 m)   Wt 60.3 kg (133 lb)   SpO2 100%   BMI 19.64 kg/m   Physical Exam  Constitutional: He is oriented to person, place, and time. He appears well-developed and well-nourished. No distress.  HENT:  Head: Normocephalic and atraumatic.  Mouth/Throat: Oropharynx is clear and moist. No oropharyngeal exudate.  Eyes: Conjunctivae and EOM are normal. Right eye exhibits no discharge. Left eye exhibits no discharge. No scleral icterus.  Neck: Normal range of motion. Neck supple.  Cardiovascular: Normal rate, regular rhythm, normal heart sounds and intact distal pulses.   Pulmonary/Chest: Effort normal. No respiratory distress. He has wheezes. He has no rales. He exhibits no tenderness.  Mild diffuse expiratory wheezing throughout. Pt without signs of increased work of breathing.  Abdominal: Soft. He exhibits no distension. There is no tenderness.  Musculoskeletal: Normal range of motion. He exhibits no edema.  Neurological: He is alert and oriented to person, place, and time.  Skin: Skin is warm and dry. He is not diaphoretic.  Nursing note and vitals reviewed.    ED Treatments / Results  Labs (all labs ordered are listed, but only abnormal results are displayed) Labs Reviewed  CBC WITH DIFFERENTIAL/PLATELET - Abnormal; Notable for the following:       Result Value   Eosinophils Absolute 0.8 (*)    All other components within normal limits  BASIC METABOLIC PANEL - Abnormal; Notable for the following:    CO2 18 (*)    BUN 21 (*)    Creatinine, Ser 1.26 (*)    GFR calc non Af Amer 59 (*)    All other components within normal limits    EKG  EKG Interpretation None       Radiology Dg Chest 2 View  Result Date: 04/29/2017 CLINICAL DATA:  Shortness of breath.  Cough. EXAM: CHEST  2 VIEW COMPARISON:  Radiograph  12/19/2016 FINDINGS: Mild hyperinflation and central bronchial thickening, chronic. The cardiomediastinal contours are normal. The lungs are clear. Pulmonary vasculature is normal. No consolidation, pleural effusion, or pneumothorax. No acute osseous abnormalities are seen. IMPRESSION: Chronic mild hyperinflation and central bronchial thickening suggesting COPD. No acute abnormality. Electronically Signed   By: Jeb Levering M.D.   On: 04/29/2017 21:34    Procedures Procedures (including critical care time)  Medications Ordered in ED Medications  albuterol (PROVENTIL) (2.5 MG/3ML) 0.083% nebulizer solution (not administered)  albuterol (PROVENTIL HFA;VENTOLIN HFA) 108 (90 Base) MCG/ACT inhaler 2 puff (not administered)  albuterol (PROVENTIL) (2.5 MG/3ML) 0.083% nebulizer solution 5 mg (5 mg Nebulization Given 04/29/17 2342)  ipratropium-albuterol (DUONEB) 0.5-2.5 (3) MG/3ML nebulizer solution 3 mL (3 mLs Nebulization Given 04/30/17 0239)  predniSONE (DELTASONE)  tablet 60 mg (60 mg Oral Given 04/30/17 0253)     Initial Impression / Assessment and Plan / ED Course  I have reviewed the triage vital signs and the nursing notes.  Pertinent labs & imaging results that were available during my care of the patient were reviewed by me and considered in my medical decision making (see chart for details).     Patient presented with cough, shortness of breath and wheezing consistent with his typical COPD exacerbations. Reports using his home neb treatments without relief. Denies fever, chest pain or hemoptysis. VSS. Exam revealed mild expiratory wheezing throughout, patient without signs of increased work of breathing or respiratory distress. Remaining exam unremarkable. Patient given DuoNeb treatment and steroids in the ED. EKG showed sinus rhythm with no acute ischemic changes. Chest x-ray showed chronic mild hyperinflation and central bronchial thickening suggesting COPD, no acute abnormality. Labs  unremarkable. On reevaluation patient reports significant improvement of symptoms. Reexamination revealed improvement of wheezing throughout. Plan to discharge patient home on prednisone and empiric antibiotic coverage. Patient given albuterol inhaler to go home with. Advised to follow up with PCP. Discussed from precautions.  Final Clinical Impressions(s) / ED Diagnoses   Final diagnoses:  COPD exacerbation (HCC)    New Prescriptions New Prescriptions   DOXYCYCLINE (VIBRAMYCIN) 100 MG CAPSULE    Take 1 capsule (100 mg total) by mouth 2 (two) times daily.   PREDNISONE (DELTASONE) 20 MG TABLET    Take 2 tablets (40 mg total) by mouth daily.     Nona Dell, PA-C 04/30/17 0340    Sherwood Gambler, MD 05/03/17 [phone]

## 2017-04-30 NOTE — Discharge Instructions (Signed)
Take your medications as prescribed. Continue using your albuterol inhaler and neb treatments at home as needed for additional relief of shortness of breath/wheezing. I recommend refraining from smoking. Follow-up with your primary care provider within the next week. Return to emergency department if symptoms worsen or new onset of fever, chest pain, difficult to breathing, coughing up blood, vomiting.

## 2017-05-17 ENCOUNTER — Other Ambulatory Visit: Payer: Self-pay | Admitting: Internal Medicine

## 2017-05-17 DIAGNOSIS — J441 Chronic obstructive pulmonary disease with (acute) exacerbation: Secondary | ICD-10-CM

## 2017-05-20 ENCOUNTER — Emergency Department (HOSPITAL_COMMUNITY): Payer: Medicare HMO

## 2017-05-20 ENCOUNTER — Encounter (HOSPITAL_COMMUNITY): Payer: Self-pay | Admitting: Emergency Medicine

## 2017-05-20 ENCOUNTER — Emergency Department (HOSPITAL_COMMUNITY)
Admission: EM | Admit: 2017-05-20 | Discharge: 2017-05-20 | Disposition: A | Discharge status: Home / Self Care | Payer: Medicare HMO | Attending: Emergency Medicine | Admitting: Emergency Medicine

## 2017-05-20 DIAGNOSIS — F17210 Nicotine dependence, cigarettes, uncomplicated: Secondary | ICD-10-CM | POA: Diagnosis not present

## 2017-05-20 DIAGNOSIS — Z79899 Other long term (current) drug therapy: Secondary | ICD-10-CM | POA: Diagnosis not present

## 2017-05-20 DIAGNOSIS — J441 Chronic obstructive pulmonary disease with (acute) exacerbation: Secondary | ICD-10-CM | POA: Diagnosis not present

## 2017-05-20 DIAGNOSIS — R05 Cough: Secondary | ICD-10-CM | POA: Diagnosis not present

## 2017-05-20 DIAGNOSIS — R0602 Shortness of breath: Secondary | ICD-10-CM | POA: Diagnosis not present

## 2017-05-20 DIAGNOSIS — Z7982 Long term (current) use of aspirin: Secondary | ICD-10-CM | POA: Diagnosis not present

## 2017-05-20 DIAGNOSIS — I10 Essential (primary) hypertension: Secondary | ICD-10-CM | POA: Insufficient documentation

## 2017-05-20 DIAGNOSIS — J45901 Unspecified asthma with (acute) exacerbation: Secondary | ICD-10-CM | POA: Diagnosis not present

## 2017-05-20 LAB — BASIC METABOLIC PANEL
Anion gap: 9 (ref 5–15)
BUN: 10 mg/dL (ref 6–20)
CO2: 23 mmol/L (ref 22–32)
CREATININE: 1.36 mg/dL — AB (ref 0.61–1.24)
Calcium: 9.5 mg/dL (ref 8.9–10.3)
Chloride: 104 mmol/L (ref 101–111)
GFR calc Af Amer: >60 mL/min (ref >60)
GFR, EST NON AFRICAN AMERICAN: 54 mL/min — AB (ref >60)
GLUCOSE: 77 mg/dL (ref 65–99)
Potassium: 4.1 mmol/L (ref 3.5–5.1)
SODIUM: 136 mmol/L (ref 135–145)

## 2017-05-20 LAB — CBC
HCT: 43.1 % (ref 39.0–52.0)
Hemoglobin: 14 g/dL (ref 13.0–17.0)
MCH: 31.9 pg (ref 26.0–34.0)
MCHC: 32.5 g/dL (ref 30.0–36.0)
MCV: 98.2 fL (ref 78.0–100.0)
PLATELETS: 276 10*3/uL (ref 150–400)
RBC: 4.39 MIL/uL (ref 4.22–5.81)
RDW: 12.7 % (ref 11.5–15.5)
WBC: 6.5 10*3/uL (ref 4.0–10.5)

## 2017-05-20 MED ORDER — PREDNISONE 20 MG PO TABS: ORAL_TABLET | ORAL | 0 refills | Status: DC

## 2017-05-20 MED ORDER — PREDNISONE 20 MG PO TABS
60.0000 mg | ORAL_TABLET | Freq: Once | ORAL | Status: AC
  Administered 2017-05-20: 60 mg via ORAL
  Filled 2017-05-20: qty 3

## 2017-05-20 MED ORDER — ALBUTEROL SULFATE (2.5 MG/3ML) 0.083% IN NEBU
INHALATION_SOLUTION | RESPIRATORY_TRACT | Status: AC
  Filled 2017-05-20: qty 6

## 2017-05-20 MED ORDER — ALBUTEROL SULFATE (2.5 MG/3ML) 0.083% IN NEBU
5.0000 mg | INHALATION_SOLUTION | Freq: Once | RESPIRATORY_TRACT | Status: AC
  Administered 2017-05-20: 5 mg via RESPIRATORY_TRACT

## 2017-05-20 MED ORDER — DOXYCYCLINE HYCLATE 100 MG PO CAPS: 100.0000 mg | ORAL_CAPSULE | Freq: Two times a day (BID) | ORAL | 0 refills | Status: DC

## 2017-05-20 MED ORDER — ALBUTEROL SULFATE HFA 108 (90 BASE) MCG/ACT IN AERS
2.0000 | INHALATION_SPRAY | Freq: Once | RESPIRATORY_TRACT | Status: AC
  Administered 2017-05-20: 2 via RESPIRATORY_TRACT
  Filled 2017-05-20: qty 6.7

## 2017-05-20 MED ORDER — IPRATROPIUM-ALBUTEROL 0.5-2.5 (3) MG/3ML IN SOLN
3.0000 mL | RESPIRATORY_TRACT | Status: AC
  Administered 2017-05-20: 3 mL via RESPIRATORY_TRACT
  Filled 2017-05-20 (×3): qty 3

## 2017-05-20 NOTE — ED Provider Notes (Signed)
[facility] DEPT Provider Note   CSN: [id number] Arrival date & time: 05/20/17  1758     History   Chief Complaint Chief Complaint  Patient presents with  . COPD    HPI [name] is a 61 y.o. male.  61 yo M with a chief complaint of shortness of breath. Patient feels like he's had a COPD exacerbation over the past week. Increased cough increased sputum and change in sputum. Started with a upper respiratory infection. Denies fevers. Has been using multiple albuterol treatments at home with minimal relief. Denies lower extremity edema, denies chest pain.   The history is provided by the patient.  Illness  This is a new problem. The current episode started more than 1 week ago. The problem occurs constantly. The problem has not changed since onset.Associated symptoms include shortness of breath. Pertinent negatives include no chest pain, no abdominal pain and no headaches. Nothing aggravates the symptoms. Nothing relieves the symptoms. He has tried nothing for the symptoms. The treatment provided no relief.    Past Medical History:  Diagnosis Date  . Asthma   . COPD (chronic obstructive pulmonary disease) ([place])   . Hypertension   . Shortness of breath     Patient Active Problem List   Diagnosis Date Noted  . Hypertension   . Acute respiratory failure with hypoxia ([place])   . History of tobacco use 02/06/2014  . Lightheaded 01/29/2014  . COPD exacerbation ([place]) 10/24/2012  . Lymph node enlargement 12/10/2011  . Tobacco abuse 08/04/2009  . ERECTILE DYSFUNCTION 12/31/2008  . COPD (chronic obstructive pulmonary disease) ([place]) 12/31/2008  . COLON POLYP 01/18/2007  . HYPERTENSION, BENIGN SYSTEMIC 01/18/2007    Past Surgical History:  Procedure Laterality Date  . COLONOSCOPY         Home Medications    Prior to Admission medications   Medication Sig Start Date End Date Taking? Authorizing Provider  albuterol (PROVENTIL) (2.5 MG/3ML) 0.083% nebulizer solution  USE 1 VIAL IN NEBULIZER 4 TIMES A DAY AS NEEDED FOR SHORTNESS OF BREATH 05/18/17  Yes [place] Bing, DO  amLODipine (NORVASC) 5 MG tablet Take 1 tablet (5 mg total) by mouth daily. 02/20/17  Yes [place] Bing, DO  aspirin 81 MG EC tablet Take 1 tablet (81 mg total) by mouth daily. 10/04/16  Yes Mayo, Pete Pelt, MD  Chlorpheniramine Maleate (ALLERGY PO) Take 1 tablet by mouth daily. Allergy pill from the dollar store   Yes [provider]  dextromethorphan-guaiFENesin (MUCINEX DM) 30-600 MG 12hr tablet Take 1 tablet by mouth as needed for cough (a needed for congestion).   Yes [provider]  lisinopril (PRINIVIL,ZESTRIL) 5 MG tablet Take 1 tablet (5 mg total) by mouth daily. 02/20/17  Yes [place] Bing, DO  albuterol (PROVENTIL HFA;VENTOLIN HFA) 108 (90 Base) MCG/ACT inhaler Inhale 2 puffs into the lungs every 6 (six) hours as needed for wheezing or shortness of breath. 02/06/17   [place] Bing, DO  doxycycline (VIBRAMYCIN) 100 MG capsule Take 1 capsule (100 mg total) by mouth 2 (two) times daily. 05/20/17   Deno Etienne, DO  fexofenadine (ALLEGRA) 180 MG tablet Take 1 tablet (180 mg total) by mouth daily. Patient not taking: Reported on 05/20/2017 02/26/14   Frazier Richards, MD  fluticasone [facility]) 50 MCG/ACT nasal spray PLACE 2 SPRAYS INTO BOTH NOSTRILS DAILY. Patient not taking: Reported on 05/20/2017 10/04/16   Mayo, Pete Pelt, MD  Fluticasone-Salmeterol (ADVAIR DISKUS) 500-50 MCG/DOSE AEPB INHALE ONE OUFF INTO  THE LUNGS TWICE A DAY Patient not taking: Reported on 05/20/2017 10/04/16   Mayo, Pete Pelt, MD  predniSONE (DELTASONE) 20 MG tablet 2 tabs po daily x 4 days 05/20/17   Deno Etienne, DO  tiotropium (SPIRIVA) 18 MCG inhalation capsule Place 1 capsule (18 mcg total) into inhaler and inhale daily. Patient not taking: Reported on 05/20/2017 10/05/16   Mayo, Pete Pelt, MD    Family History Family History  Problem Relation Age of Onset  . Cancer Father     Social  History Social History  Substance Use Topics  . Smoking status: Current Every Day Smoker    Packs/day: 0.50    Years: 30.00    Types: Cigarettes    Last attempt to quit: 08/21/2013  . Smokeless tobacco: Never Used  . Alcohol use Yes     Comment: rare/occasional     Allergies   Patient has no known allergies.   Review of Systems Review of Systems  Constitutional: Negative for chills and fever.  HENT: Positive for congestion. Negative for facial swelling.   Eyes: Negative for discharge and visual disturbance.  Respiratory: Positive for cough, shortness of breath and wheezing.   Cardiovascular: Negative for chest pain and palpitations.  Gastrointestinal: Negative for abdominal pain, diarrhea and vomiting.  Musculoskeletal: Negative for arthralgias and myalgias.  Skin: Negative for color change and rash.  Neurological: Negative for tremors, syncope and headaches.  Psychiatric/Behavioral: Negative for confusion and dysphoric mood.     Physical Exam Updated Vital Signs BP (!) 158/92   Pulse 80   Temp 98.2 F (36.8 C) (Oral)   Resp (!) 21   Ht 5\' 9"  (1.753 m)   Wt 60.3 kg (133 lb)   SpO2 97%   BMI 19.64 kg/m   Physical Exam  Constitutional: He is oriented to person, place, and time. He appears well-developed and well-nourished.  HENT:  Head: Normocephalic and atraumatic.  Eyes: EOM are normal. Pupils are equal, round, and reactive to light.  Neck: Normal range of motion. Neck supple. No JVD present.  Cardiovascular: Normal rate and regular rhythm.  Exam reveals no gallop and no friction rub.   No murmur heard. Pulmonary/Chest: No respiratory distress. He has wheezes (prolonged expiration, diffuse).  Abdominal: He exhibits no distension and no mass. There is no tenderness. There is no rebound and no guarding.  Musculoskeletal: Normal range of motion. He exhibits no edema.  Neurological: He is alert and oriented to person, place, and time.  Skin: No rash noted. No  pallor.  Psychiatric: He has a normal mood and affect. His behavior is normal.  Nursing note and vitals reviewed.    ED Treatments / Results  Labs (all labs ordered are listed, but only abnormal results are displayed) Labs Reviewed  BASIC METABOLIC PANEL - Abnormal; Notable for the following:       Result Value   Creatinine, Ser 1.36 (*)    GFR calc non Af Amer 54 (*)    All other components within normal limits  CBC    EKG  EKG Interpretation  Date/Time:  Saturday May 20 2017 18:01:42 EDT Ventricular Rate:  85 PR Interval:  142 QRS Duration: 102 QT Interval:  388 QTC Calculation: 461 R Axis:   78 Text Interpretation:  Normal sinus rhythm Septal infarct , age undetermined Abnormal ECG No significant change since last tracing Confirmed by Deno Etienne [phone]) on 05/20/2017 6:51:54 PM       Radiology Dg Chest 2 View  Result Date:  05/20/2017 CLINICAL DATA:  Cough and shortness of breath EXAM: CHEST  2 VIEW COMPARISON:  04/29/2017 FINDINGS: The heart size and mediastinal contours are within normal limits. Both lungs are clear. The visualized skeletal structures are unremarkable. IMPRESSION: No active cardiopulmonary disease. Electronically Signed   By: Donavan Foil M.D.   On: 05/20/2017 19:56    Procedures Procedures (including critical care time)  Medications Ordered in ED Medications  ipratropium-albuterol (DUONEB) 0.5-2.5 (3) MG/3ML nebulizer solution 3 mL (3 mLs Nebulization Given 05/20/17 1913)  albuterol (PROVENTIL) (2.5 MG/3ML) 0.083% nebulizer solution 5 mg (5 mg Nebulization Given 05/20/17 1808)  predniSONE (DELTASONE) tablet 60 mg (60 mg Oral Given 05/20/17 1913)  albuterol (PROVENTIL HFA;VENTOLIN HFA) 108 (90 Base) MCG/ACT inhaler 2 puff (2 puffs Inhalation Given 05/20/17 2012)     Initial Impression / Assessment and Plan / ED Course  I have reviewed the triage vital signs and the nursing notes.  Pertinent labs & imaging results that were available during my  care of the patient were reviewed by me and considered in my medical decision making (see chart for details).     61 yo M With a chief complaints of shortness of breath. Most likely a COPD exacerbation. We'll give 3 DuoNeb's back-to-back steroids and reassess.  Feeling much better on reassessment. D/c home.   8:55 PM:  I have discussed the diagnosis/risks/treatment options with the patient and family and believe the pt to be eligible for discharge home to follow-up with PCP. We also discussed returning to the ED immediately if new or worsening sx occur. We discussed the sx which are most concerning (e.g., sudden worsening pain, fever, inability to tolerate by mouth) that necessitate immediate return. Medications administered to the patient during their visit and any new prescriptions provided to the patient are listed below.  Medications given during this visit Medications  ipratropium-albuterol (DUONEB) 0.5-2.5 (3) MG/3ML nebulizer solution 3 mL (3 mLs Nebulization Given 05/20/17 1913)  albuterol (PROVENTIL) (2.5 MG/3ML) 0.083% nebulizer solution 5 mg (5 mg Nebulization Given 05/20/17 1808)  predniSONE (DELTASONE) tablet 60 mg (60 mg Oral Given 05/20/17 1913)  albuterol (PROVENTIL HFA;VENTOLIN HFA) 108 (90 Base) MCG/ACT inhaler 2 puff (2 puffs Inhalation Given 05/20/17 2012)     The patient appears reasonably screen and/or stabilized for discharge and I doubt any other medical condition or other [facility] requiring further screening, evaluation, or treatment in the ED at this time prior to discharge.    Final Clinical Impressions(s) / ED Diagnoses   Final diagnoses:  COPD exacerbation [facility])    New Prescriptions Discharge Medication List as of 05/20/2017  8:06 PM       Deno Etienne, DO 05/20/17 2055

## 2017-05-20 NOTE — ED Notes (Signed)
Pt understood dc material. NAD Noted. Scripts and inhalers given at [facility]

## 2017-05-20 NOTE — ED Notes (Signed)
Patient transported to X-ray 

## 2017-05-20 NOTE — Discharge Instructions (Signed)
Use your inhaler every 4 hours(6 puffs) while awake, return for sudden worsening shortness of breath, or if you need to use your inhaler more often.  ° °

## 2017-05-20 NOTE — ED Triage Notes (Signed)
Pt c/o COPD symptoms for past couple days. Pt reports recently had medications for COPD but feels it was for long enough period.

## 2017-06-07 ENCOUNTER — Other Ambulatory Visit: Payer: Self-pay | Admitting: *Deleted

## 2017-06-07 DIAGNOSIS — J410 Simple chronic bronchitis: Secondary | ICD-10-CM

## 2017-06-07 MED ORDER — FLUTICASONE-SALMETEROL 500-50 MCG/DOSE IN AEPB: INHALATION_SPRAY | RESPIRATORY_TRACT | 3 refills | Status: DC

## 2017-06-27 ENCOUNTER — Other Ambulatory Visit: Payer: Self-pay | Admitting: Internal Medicine

## 2017-06-27 DIAGNOSIS — I10 Essential (primary) hypertension: Secondary | ICD-10-CM

## 2017-07-04 ENCOUNTER — Other Ambulatory Visit: Payer: Self-pay | Admitting: Family Medicine

## 2017-07-04 DIAGNOSIS — J441 Chronic obstructive pulmonary disease with (acute) exacerbation: Secondary | ICD-10-CM

## 2017-10-04 ENCOUNTER — Other Ambulatory Visit: Payer: Self-pay | Admitting: Family Medicine

## 2017-10-04 DIAGNOSIS — I10 Essential (primary) hypertension: Secondary | ICD-10-CM

## 2017-10-17 ENCOUNTER — Other Ambulatory Visit: Payer: Self-pay

## 2017-10-17 ENCOUNTER — Emergency Department (HOSPITAL_COMMUNITY)
Admission: EM | Admit: 2017-10-17 | Discharge: 2017-10-17 | Disposition: A | Discharge status: Home / Self Care | Payer: Medicare HMO | Attending: Emergency Medicine | Admitting: Emergency Medicine

## 2017-10-17 ENCOUNTER — Encounter (HOSPITAL_COMMUNITY): Payer: Self-pay

## 2017-10-17 DIAGNOSIS — J449 Chronic obstructive pulmonary disease, unspecified: Secondary | ICD-10-CM | POA: Insufficient documentation

## 2017-10-17 DIAGNOSIS — K0889 Other specified disorders of teeth and supporting structures: Secondary | ICD-10-CM | POA: Diagnosis not present

## 2017-10-17 DIAGNOSIS — F17210 Nicotine dependence, cigarettes, uncomplicated: Secondary | ICD-10-CM | POA: Diagnosis not present

## 2017-10-17 DIAGNOSIS — Z79899 Other long term (current) drug therapy: Secondary | ICD-10-CM | POA: Insufficient documentation

## 2017-10-17 DIAGNOSIS — J45909 Unspecified asthma, uncomplicated: Secondary | ICD-10-CM | POA: Insufficient documentation

## 2017-10-17 DIAGNOSIS — Z7982 Long term (current) use of aspirin: Secondary | ICD-10-CM | POA: Insufficient documentation

## 2017-10-17 DIAGNOSIS — I10 Essential (primary) hypertension: Secondary | ICD-10-CM | POA: Insufficient documentation

## 2017-10-17 MED ORDER — TRAMADOL HCL 50 MG PO TABS: 50.0000 mg | ORAL_TABLET | Freq: Two times a day (BID) | ORAL | 0 refills | Status: DC | PRN

## 2017-10-17 MED ORDER — AMOXICILLIN 500 MG PO CAPS: 500.0000 mg | ORAL_CAPSULE | Freq: Three times a day (TID) | ORAL | 0 refills | Status: DC

## 2017-10-17 MED ORDER — TRAMADOL HCL 50 MG PO TABS
50.0000 mg | ORAL_TABLET | Freq: Once | ORAL | Status: AC
  Administered 2017-10-17: 50 mg via ORAL
  Filled 2017-10-17: qty 1

## 2017-10-17 NOTE — ED Provider Notes (Signed)
[facility] EMERGENCY DEPARTMENT Provider Note   CSN: [id number] Arrival date & time: 10/17/17  1456     History   Chief Complaint Chief Complaint  Patient presents with  . Dental Pain    HPI [name] is a 61 y.o. male who presents with right upper dental pain that started 3 days ago.  Patient states the pain has been progressively worsening.  Has been taking Tylenol without relief. No alleviating/aggrevating factors. Reports extensive dental history with intermittent need for antibiotics.  Intends to see his dentist.  No associated sxs. Denies fever, chills, discharge in mouth, neck pain, neck stiffness, dyspnea or difficulty swallowing.  HPI  Past Medical History:  Diagnosis Date  . Asthma   . COPD (chronic obstructive pulmonary disease) ([place])   . Hypertension   . Shortness of breath     Patient Active Problem List   Diagnosis Date Noted  . Hypertension   . Acute respiratory failure with hypoxia ([place])   . History of tobacco use 02/06/2014  . Lightheaded 01/29/2014  . COPD exacerbation ([place]) 10/24/2012  . Lymph node enlargement 12/10/2011  . Tobacco abuse 08/04/2009  . ERECTILE DYSFUNCTION 12/31/2008  . COPD (chronic obstructive pulmonary disease) ([place]) 12/31/2008  . COLON POLYP 01/18/2007  . HYPERTENSION, BENIGN SYSTEMIC 01/18/2007    Past Surgical History:  Procedure Laterality Date  . COLONOSCOPY         Home Medications    Prior to Admission medications   Medication Sig Start Date End Date Taking? Authorizing Provider  albuterol (PROVENTIL) (2.5 MG/3ML) 0.083% nebulizer solution USE 1 VIAL IN NEBULIZER 4 TIMES A DAY AS NEEDED FOR SHORTNESS OF BREATH 07/05/17   [place] Bing, DO  amLODipine (NORVASC) 5 MG tablet TAKE 1 TABLET EVERY DAY 10/05/17   [place] Bing, DO  aspirin 81 MG EC tablet Take 1 tablet (81 mg total) by mouth daily. 10/04/16   Mayo, Pete Pelt, MD  Chlorpheniramine Maleate (ALLERGY PO) Take 1 tablet by  mouth daily. Allergy pill from the dollar store    [provider]  dextromethorphan-guaiFENesin (MUCINEX DM) 30-600 MG 12hr tablet Take 1 tablet by mouth as needed for cough (a needed for congestion).    [provider]  doxycycline (VIBRAMYCIN) 100 MG capsule Take 1 capsule (100 mg total) by mouth 2 (two) times daily. 05/20/17   Deno Etienne, DO  fexofenadine (ALLEGRA) 180 MG tablet Take 1 tablet (180 mg total) by mouth daily. Patient not taking: Reported on 05/20/2017 02/26/14   Frazier Richards, MD  fluticasone [facility]) 50 MCG/ACT nasal spray PLACE 2 SPRAYS INTO BOTH NOSTRILS DAILY. Patient not taking: Reported on 05/20/2017 10/04/16   Sela Hua, MD  Fluticasone-Salmeterol (ADVAIR DISKUS) 500-50 MCG/DOSE AEPB INHALE ONE OUFF INTO THE LUNGS TWICE A DAY 06/07/17   [place] Bing, DO  lisinopril (PRINIVIL,ZESTRIL) 5 MG tablet TAKE 1 TABLET EVERY DAY 10/05/17   [place] Bing, DO  predniSONE (DELTASONE) 20 MG tablet 2 tabs po daily x 4 days 05/20/17   Deno Etienne, DO  tiotropium (SPIRIVA) 18 MCG inhalation capsule Place 1 capsule (18 mcg total) into inhaler and inhale daily. Patient not taking: Reported on 05/20/2017 10/05/16   Sela Hua, MD  VENTOLIN HFA 108 [phone] Base) MCG/ACT inhaler INHALE 2 PUFFS INTO THE LUNGS EVERY 6 HOURS AS NEEDED FOR WHEEZING OR SHORTNESS OF BREATH. 07/05/17   [place] Bing, DO    Family History Family History  Problem Relation Age of  Onset  . Cancer Father     Social History Social History   Tobacco Use  . Smoking status: Current Every Day Smoker    Packs/day: 0.50    Years: 30.00    Pack years: 15.00    Types: Cigarettes    Last attempt to quit: 08/21/2013    Years since quitting: 4.1  . Smokeless tobacco: Never Used  Substance Use Topics  . Alcohol use: Yes    Comment: rare/occasional  . Drug use: No     Allergies   Patient has no known allergies.   Review of Systems Review of Systems  Constitutional: Negative for  chills and fever.  HENT: Positive for dental problem. Negative for congestion, ear pain and tinnitus.   Respiratory: Negative for shortness of breath.   Gastrointestinal: Negative for nausea and vomiting.  Musculoskeletal: Negative for neck pain and neck stiffness.     Physical Exam Updated Vital Signs BP (!) 160/96 (BP Location: Right Arm)   Pulse 99   Temp 98.3 F (36.8 C) (Oral)   Resp 16   Ht 5\' 9"  (1.753 m)   Wt 63.5 kg (140 lb)   SpO2 99%   BMI 20.67 kg/m   Physical Exam  Constitutional: He appears well-developed and well-nourished. No distress.  HENT:  Head: Normocephalic and atraumatic.  Right Ear: No mastoid tenderness. Tympanic membrane is not perforated, not erythematous, not retracted and not bulging.  Left Ear: No mastoid tenderness. Tympanic membrane is not perforated, not erythematous, not retracted and not bulging.  Mouth/Throat: Uvula is midline. No trismus in the jaw. No oropharyngeal exudate or posterior oropharyngeal erythema.  Patient with diffuse dental decay with multiple missing teeth. R upper incisor gum area is mildly erythematous and swollen. The area is non fluctuant and without appreciable abscess. Patient with moist mucous membranes.  Eyes: Conjunctivae are normal.  Neck: Normal range of motion. Neck supple.  Pulmonary/Chest: No respiratory distress.  Lymphadenopathy:    He has no cervical adenopathy.  Neurological: He is alert.  Clear speech.   Psychiatric: He has a normal mood and affect. His behavior is normal. Thought content normal.  Nursing note and vitals reviewed.    ED Treatments / Results  Labs (all labs ordered are listed, but only abnormal results are displayed) Labs Reviewed - No data to display  EKG  EKG Interpretation None       Radiology No results found.  Procedures Procedures (including critical care time)  Medications Ordered in ED Medications  traMADol (ULTRAM) tablet 50 mg (not administered)      Initial Impression / Assessment and Plan / ED Course  I have reviewed the triage vital signs and the nursing notes.  Pertinent labs & imaging results that were available during my care of the patient were reviewed by me and considered in my medical decision making (see chart for details).  Patient presents with dental pain. He is nontoxic appearing with stable vitals. Patient is afebrile without focal abscess seen. He has full ROM of his neck, is able to swallow secretions without difficulty. Exam is unconcerning for Ludwig's angina or spread of infection. Will treat with Amoxicillin and Tramadol. NSAIDs considered; however upon review of previous labs patient's creatinine has been frequently elevated, Tylenol has not been effective, therefore short course of Tramadol was used for pain control. [place] Controlled Substance reporting System queried prior to prescription. I urged the patient to follow up with dentist within 48 hours. I have also discussed reasons to  return immediately to the ER. Provided opportunity for questions, patient expresses understanding and agrees with plan.    Of note patient's blood pressure somewhat elevated in the emergency department discussed need for re-check and compliance with BP medications. Denies HA, CP, dyspnea, change in vision, numbness, or weakness.   Final Clinical Impressions(s) / ED Diagnoses   Final diagnoses:  Pain, dental    ED Discharge Orders        Ordered    amoxicillin (AMOXIL) 500 MG capsule  3 times daily     10/17/17 1648    traMADol (ULTRAM) 50 MG tablet  2 times daily PRN     10/17/17 1648       Omir Cooprider, [place] R, PA-C 10/17/17 1706    Fredia Sorrow, MD 10/18/17 1611

## 2017-10-17 NOTE — ED Triage Notes (Signed)
Pt complains of dental pain x3 days. Pt reports taking 2 500 mg tylenol every 3 hours with little relief. Pt is alert and oriented.

## 2017-10-17 NOTE — Discharge Instructions (Signed)
He was seen in the emergency department for dental pain. You were  provided a prescription for amoxicillin please be sure to take this for the entire course of the antibiotic treat the infection. You were also given a prescription for Tramadol, take this once every 12 hours for pain.   Call your dentists office to schedule an appointment for re-evaluation and further management within the next 48 hours.   Please take all of your antibiotics until finished. You may develop abdominal discomfort or diarrhea from the antibiotic.  You may help offset this with probiotics which you can buy at the store (ask your pharmacist if unable to find) or get probiotics in the form of eating yogurt. Do not eat or take the probiotics until 2 hours after your antibiotic. If you are unable to tolerate these side effects follow-up with your primary care provider or return to the emergency department.   If you begin to experience any blistering, rashes, swelling, or difficulty breathing seek medical care for evaluation of potentially more serious side effects.   Please be aware that these medications may interact with other medications you are taking, please be sure to discuss your medication list with your pharmacist.  If you start to experience and new or worsening symptoms return to the emergency department. If you start to experience fever, chills, neck stiffness/pain, or inability to move your neck come back to the emergency department immediately.

## 2017-10-19 NOTE — Progress Notes (Deleted)
   Subjective:    Patient ID: [name], male    DOB: Jun 21, 1956, 61 y.o.   MRN: [id number]   CC: ER follow up  HPI: Dental pain Discharged with amoxicillin x7days and tramadol  Hypertension: - Medications: amlodipine 5mg , lisinopril 5mg ,  - Compliance: *** - Checking BP at home: *** - Denies any SOB, CP, vision changes, LE edema, medication SEs, or symptoms of hypotension - Diet: *** - Exercise: ***   Smoking status reviewed  Review of Systems   Objective:  There were no vitals taken for this visit. Vitals and nursing note reviewed  General: well nourished, in no acute distress HEENT: normocephalic, TM's visualized bilaterally, no scleral icterus or conjunctival pallor, no nasal discharge, moist mucous membranes, good dentition without erythema or discharge noted in posterior oropharynx Neck: supple, non-tender, without lymphadenopathy Cardiac: RRR, clear S1 and S2, no murmurs, rubs, or gallops Respiratory: clear to auscultation bilaterally, no increased work of breathing Abdomen: soft, nontender, nondistended, no masses or organomegaly. Bowel sounds present Extremities: no edema or cyanosis. Warm, well perfused. 2+ radial and PT pulses bilaterally Skin: warm and dry, no rashes noted Neuro: alert and oriented, no focal deficits   Assessment & Plan:    No problem-specific Assessment & Plan notes found for this encounter.    No Follow-up on file.   Caroline More, DO, PGY-1

## 2017-10-20 ENCOUNTER — Ambulatory Visit: Payer: Commercial Managed Care - HMO | Admitting: Family Medicine

## 2017-11-06 ENCOUNTER — Other Ambulatory Visit: Payer: Self-pay | Admitting: Family Medicine

## 2017-11-06 DIAGNOSIS — J441 Chronic obstructive pulmonary disease with (acute) exacerbation: Secondary | ICD-10-CM

## 2017-11-08 ENCOUNTER — Other Ambulatory Visit: Payer: Self-pay | Admitting: Family Medicine

## 2017-11-08 DIAGNOSIS — J441 Chronic obstructive pulmonary disease with (acute) exacerbation: Secondary | ICD-10-CM

## 2017-11-08 DIAGNOSIS — J410 Simple chronic bronchitis: Secondary | ICD-10-CM

## 2017-11-08 NOTE — Telephone Encounter (Signed)
Pt picked his inhaler on Monday.  He has lost it and needs another Rx called in for that. He also needs refills on Spriva, advair disc, albuetrol solution.  [facility] [address]

## 2017-11-08 NOTE — Telephone Encounter (Signed)
Patient has not been seen at [facility] in over one year.  I see he has had multiple ED visits.  I have not formally evaluated him yet.  Please call and schedule an appointment for him.  Once this is done, I would be happy to prescribe his medications to hold him over until his next visit.  Thank you.

## 2017-11-22 NOTE — Telephone Encounter (Signed)
LM for patient to call back and schedule an appointment with PCP. Jazmin Hartsell,CMA  

## 2017-11-23 ENCOUNTER — Telehealth: Payer: Self-pay | Admitting: Family Medicine

## 2017-11-23 NOTE — Telephone Encounter (Signed)
Will forward to MD to advise. Shaianne Nucci,CMA  

## 2017-11-23 NOTE — Telephone Encounter (Signed)
Pt called asking why he hasn't gotten refills on his meds yet. I read off dr note in the phone note. I made him first available appointment w/ his PCP on 1/25. Pt would like to know if he can have a refill to last him to that appointment.

## 2017-11-24 ENCOUNTER — Other Ambulatory Visit: Payer: Self-pay | Admitting: Family Medicine

## 2017-11-24 MED ORDER — ALBUTEROL SULFATE HFA 108 (90 BASE) MCG/ACT IN AERS: 2.0000 | INHALATION_SPRAY | RESPIRATORY_TRACT | 2 refills | Status: DC | PRN

## 2017-11-24 NOTE — Telephone Encounter (Signed)
Changed Ventolin to ProAir. I am assuming this is his new formulary and is why it was denied?  Harriet Butte, [place], PGY-2

## 2017-12-15 ENCOUNTER — Ambulatory Visit: Payer: Medicare HMO | Admitting: Family Medicine

## 2017-12-15 NOTE — Progress Notes (Deleted)
   Subjective   Patient ID: [name]    DOB: [birth year], 62 y.o. male   MRN: [id number]  CC: "***"  HPI: [name] is a 62 y.o. male who presents to clinic today for the following:  ***: ***  ***Here today for med refills.  Last seen in the clinic 11/2015.  Has been seen in the ED 6 times since last visit with one admission for COPD exacerbation.  Patient currently on Advair and Spiriva.  Needs new PFTs.  Patient states colonoscopy was performed on 05/2012.  Obtain records.  Health maintenance includes HCV and HIV screening.  Overdue for flu vaccine and tetanus booster.  Check cervical and axillary lymph nodes.  ROS: see HPI for pertinent.  [place]: COPD, HTN, ED, tobacco use disorder, h/o colonic adenomatous polyps, lymph node enlargement. Smoking status reviewed. Medications reviewed.  Objective   There were no vitals taken for this visit. Vitals and nursing note reviewed.  General: well nourished, well developed, NAD with non-toxic appearance HEENT: normocephalic, atraumatic, moist mucous membranes Neck: supple, non-tender without lymphadenopathy Cardiovascular: regular rate and rhythm without murmurs, rubs, or gallops Lungs: clear to auscultation bilaterally with normal work of breathing Abdomen: soft, non-tender, non-distended, normoactive bowel sounds Skin: warm, dry, no rashes or lesions, cap refill < 2 seconds Extremities: warm and well perfused, normal tone, no edema  Assessment & Plan   No problem-specific Assessment & Plan notes found for this encounter.  No orders of the defined types were placed in this encounter.  No orders of the defined types were placed in this encounter.   Harriet Butte, [place], PGY-2 12/15/2017, 9:37 AM

## 2018-01-10 ENCOUNTER — Other Ambulatory Visit: Payer: Self-pay | Admitting: Internal Medicine

## 2018-02-27 ENCOUNTER — Other Ambulatory Visit: Payer: Self-pay | Admitting: Family Medicine

## 2018-02-27 DIAGNOSIS — J441 Chronic obstructive pulmonary disease with (acute) exacerbation: Secondary | ICD-10-CM

## 2018-02-28 ENCOUNTER — Other Ambulatory Visit: Payer: Self-pay | Admitting: Family Medicine

## 2018-02-28 DIAGNOSIS — J410 Simple chronic bronchitis: Secondary | ICD-10-CM

## 2018-03-01 ENCOUNTER — Other Ambulatory Visit: Payer: Self-pay | Admitting: Family Medicine

## 2018-03-01 DIAGNOSIS — J410 Simple chronic bronchitis: Secondary | ICD-10-CM

## 2018-03-12 ENCOUNTER — Other Ambulatory Visit: Payer: Self-pay | Admitting: Family Medicine

## 2018-03-12 DIAGNOSIS — I10 Essential (primary) hypertension: Secondary | ICD-10-CM

## 2018-03-23 ENCOUNTER — Emergency Department (HOSPITAL_COMMUNITY)
Admission: EM | Admit: 2018-03-23 | Discharge: 2018-03-23 | Disposition: A | Discharge status: Home / Self Care | Payer: Medicare HMO | Attending: Emergency Medicine | Admitting: Emergency Medicine

## 2018-03-23 ENCOUNTER — Other Ambulatory Visit: Payer: Self-pay

## 2018-03-23 ENCOUNTER — Encounter (HOSPITAL_COMMUNITY): Payer: Self-pay | Admitting: Emergency Medicine

## 2018-03-23 ENCOUNTER — Emergency Department (HOSPITAL_COMMUNITY): Payer: Medicare HMO

## 2018-03-23 DIAGNOSIS — Z79899 Other long term (current) drug therapy: Secondary | ICD-10-CM | POA: Diagnosis not present

## 2018-03-23 DIAGNOSIS — J449 Chronic obstructive pulmonary disease, unspecified: Secondary | ICD-10-CM | POA: Diagnosis not present

## 2018-03-23 DIAGNOSIS — J441 Chronic obstructive pulmonary disease with (acute) exacerbation: Secondary | ICD-10-CM | POA: Diagnosis not present

## 2018-03-23 DIAGNOSIS — F17210 Nicotine dependence, cigarettes, uncomplicated: Secondary | ICD-10-CM | POA: Diagnosis not present

## 2018-03-23 DIAGNOSIS — I10 Essential (primary) hypertension: Secondary | ICD-10-CM | POA: Insufficient documentation

## 2018-03-23 DIAGNOSIS — R0602 Shortness of breath: Secondary | ICD-10-CM | POA: Diagnosis not present

## 2018-03-23 DIAGNOSIS — R069 Unspecified abnormalities of breathing: Secondary | ICD-10-CM | POA: Diagnosis not present

## 2018-03-23 LAB — CBC
HEMATOCRIT: 40.8 % (ref 39.0–52.0)
Hemoglobin: 13.9 g/dL (ref 13.0–17.0)
MCH: 32.9 pg (ref 26.0–34.0)
MCHC: 34.1 g/dL (ref 30.0–36.0)
MCV: 96.7 fL (ref 78.0–100.0)
Platelets: 250 10*3/uL (ref 150–400)
RBC: 4.22 MIL/uL (ref 4.22–5.81)
RDW: 12.2 % (ref 11.5–15.5)
WBC: 8.4 10*3/uL (ref 4.0–10.5)

## 2018-03-23 LAB — BASIC METABOLIC PANEL
Anion gap: 9 (ref 5–15)
BUN: 15 mg/dL (ref 6–20)
CALCIUM: 9.3 mg/dL (ref 8.9–10.3)
CO2: 22 mmol/L (ref 22–32)
Chloride: 108 mmol/L (ref 101–111)
Creatinine, Ser: 1.28 mg/dL — ABNORMAL HIGH (ref 0.61–1.24)
GFR calc Af Amer: >60 mL/min (ref >60)
GFR, EST NON AFRICAN AMERICAN: 58 mL/min — AB (ref >60)
GLUCOSE: 71 mg/dL (ref 65–99)
Potassium: 4.2 mmol/L (ref 3.5–5.1)
Sodium: 139 mmol/L (ref 135–145)

## 2018-03-23 LAB — I-STAT VENOUS BLOOD GAS, ED
Acid-base deficit: 8 mmol/L — ABNORMAL HIGH (ref 0.0–2.0)
Bicarbonate: 19.9 mmol/L — ABNORMAL LOW (ref 20.0–28.0)
O2 SAT: 81 %
PCO2 VEN: 50 mmHg (ref 44.0–60.0)
PH VEN: 7.208 — AB (ref 7.250–7.430)
TCO2: 21 mmol/L — AB (ref 22–32)
pO2, Ven: 55 mmHg — ABNORMAL HIGH (ref 32.0–45.0)

## 2018-03-23 MED ORDER — MAGNESIUM SULFATE 2 GM/50ML IV SOLN
2.0000 g | Freq: Once | INTRAVENOUS | Status: AC
  Administered 2018-03-23: 2 g via INTRAVENOUS
  Filled 2018-03-23: qty 50

## 2018-03-23 MED ORDER — ALBUTEROL (5 MG/ML) CONTINUOUS INHALATION SOLN
15.0000 mg/h | INHALATION_SOLUTION | Freq: Once | RESPIRATORY_TRACT | Status: AC
  Administered 2018-03-23: 15 mg/h via RESPIRATORY_TRACT
  Filled 2018-03-23: qty 20

## 2018-03-23 MED ORDER — SODIUM CHLORIDE 0.9 % IV BOLUS
1000.0000 mL | Freq: Once | INTRAVENOUS | Status: AC
  Administered 2018-03-23: 1000 mL via INTRAVENOUS

## 2018-03-23 MED ORDER — IPRATROPIUM BROMIDE 0.02 % IN SOLN
0.5000 mg | Freq: Once | RESPIRATORY_TRACT | Status: AC
  Administered 2018-03-23: 0.5 mg via RESPIRATORY_TRACT
  Filled 2018-03-23: qty 2.5

## 2018-03-23 MED ORDER — PREDNISONE 20 MG PO TABS: 40.0000 mg | ORAL_TABLET | Freq: Every day | ORAL | 0 refills | Status: DC

## 2018-03-23 MED ORDER — ALBUTEROL SULFATE HFA 108 (90 BASE) MCG/ACT IN AERS
2.0000 | INHALATION_SPRAY | RESPIRATORY_TRACT | Status: DC | PRN
  Administered 2018-03-23: 2 via RESPIRATORY_TRACT
  Filled 2018-03-23: qty 6.7

## 2018-03-23 NOTE — ED Triage Notes (Signed)
Pt in from home via [facility] with c/o cold x 2 days, arrives with labored breathing. Given 10mg  Albuterol, 1mg  Atrovent and 125 of Solumedrol en route. Wheezes bilateral. A&ox4, able to speak in short sentences, sats 98% on 10L duoneb. Denies cp, fevers or n/v

## 2018-03-23 NOTE — ED Provider Notes (Signed)
[facility] EMERGENCY DEPARTMENT Provider Note   CSN: [id number] Arrival date & time: 03/23/18  0725     History   Chief Complaint Chief Complaint  Patient presents with  . Shortness of Breath    HPI [name] is a 62 y.o. male.  The history is provided by the patient.  Shortness of Breath  This is a recurrent problem. The average episode lasts 4 days. The problem occurs continuously.Episode onset: 4 days. The problem has been gradually worsening. Associated symptoms include rhinorrhea, cough, sputum production and wheezing. Pertinent negatives include no fever, no hemoptysis, no chest pain, no syncope, no vomiting, no abdominal pain, no leg pain, no leg swelling and no claudication. The problem's precipitants include pollens. Risk factors include smoking. He has tried inhaled steroids and beta-agonist inhalers for the symptoms. The treatment provided no relief. He has had prior hospitalizations. He has had prior ED visits. He has had prior ICU admissions. Associated medical issues include asthma and COPD. Associated medical issues comments: prior intubation.    Past Medical History:  Diagnosis Date  . Asthma   . COPD (chronic obstructive pulmonary disease) ([place])   . Hypertension   . Shortness of breath     Patient Active Problem List   Diagnosis Date Noted  . Primary hypertension   . Lymph node enlargement 12/10/2011  . Tobacco use disorder 08/04/2009  . Erectile dysfunction 12/31/2008  . COPD (chronic obstructive pulmonary disease) ([place]) 12/31/2008  . History of colonic polyps 01/18/2007    Past Surgical History:  Procedure Laterality Date  . COLONOSCOPY          Home Medications    Prior to Admission medications   Medication Sig Start Date End Date Taking? Authorizing Provider  albuterol (PROVENTIL HFA;VENTOLIN HFA) 108 (90 Base) MCG/ACT inhaler Inhale 2-4 puffs into the lungs every 4 (four) hours as needed for wheezing (or cough).  11/24/17   [place] Bing, DO  albuterol (PROVENTIL) (2.5 MG/3ML) 0.083% nebulizer solution USE 1 VIAL IN NEBULIZER 4 TIMES A DAY AS NEEDED FOR SHORTNESS OF BREATH 02/27/18   [place] Bing, DO  amLODipine (NORVASC) 5 MG tablet Take 1 tablet (5 mg total) by mouth daily. 03/13/18   [place] Bing, DO  amoxicillin (AMOXIL) 500 MG capsule Take 1 capsule (500 mg total) by mouth 3 (three) times daily. 10/17/17   Petrucelli, Glynda Jaeger, PA-C  aspirin 81 MG EC tablet Take 1 tablet (81 mg total) by mouth daily. 10/04/16   Mayo, Pete Pelt, MD  Chlorpheniramine Maleate (ALLERGY PO) Take 1 tablet by mouth daily. Allergy pill from the dollar store    [provider]  dextromethorphan-guaiFENesin (MUCINEX DM) 30-600 MG 12hr tablet Take 1 tablet by mouth as needed for cough (a needed for congestion).    [provider]  doxycycline (VIBRAMYCIN) 100 MG capsule Take 1 capsule (100 mg total) by mouth 2 (two) times daily. 05/20/17   Deno Etienne, DO  fexofenadine (ALLEGRA) 180 MG tablet Take 1 tablet (180 mg total) by mouth daily. Patient not taking: Reported on 05/20/2017 02/26/14   Frazier Richards, MD  fluticasone [facility]) 50 MCG/ACT nasal spray PLACE 2 SPRAYS INTO BOTH NOSTRILS DAILY. Patient not taking: Reported on 05/20/2017 10/04/16   Sela Hua, MD  Fluticasone-Salmeterol (ADVAIR DISKUS) 500-50 MCG/DOSE AEPB INHALE ONE PUFF INTO THE LUNGS TWICE A DAY 02/28/18   [place] Bing, DO  Fluticasone-Salmeterol (ADVAIR DISKUS) 500-50 MCG/DOSE AEPB INHALE ONE PUFF INTO THE LUNGS  TWICE A DAY 03/01/18   [place] Bing, DO  lisinopril (PRINIVIL,ZESTRIL) 5 MG tablet Take 1 tablet (5 mg total) by mouth daily. 03/13/18   [place] Bing, DO  predniSONE (DELTASONE) 20 MG tablet 2 tabs po daily x 4 days 05/20/17   Deno Etienne, DO  tiotropium (SPIRIVA HANDIHALER) 18 MCG inhalation capsule Place 1 capsule (18 mcg total) into inhaler and inhale daily. 01/10/18   [place] Bing, DO  traMADol (ULTRAM) 50 MG  tablet Take 1 tablet (50 mg total) by mouth 2 (two) times daily as needed. 10/17/17   Petrucelli, Glynda Jaeger, PA-C    Family History Family History  Problem Relation Age of Onset  . Cancer Father     Social History Social History   Tobacco Use  . Smoking status: Current Every Day Smoker    Packs/day: 0.50    Years: 30.00    Pack years: 15.00    Types: Cigarettes    Last attempt to quit: 08/21/2013    Years since quitting: 4.5  . Smokeless tobacco: Never Used  Substance Use Topics  . Alcohol use: Yes    Comment: rare/occasional  . Drug use: No     Allergies   Patient has no known allergies.   Review of Systems Review of Systems  Constitutional: Negative for fever.  HENT: Positive for rhinorrhea.   Respiratory: Positive for cough, sputum production, shortness of breath and wheezing. Negative for hemoptysis.   Cardiovascular: Negative for chest pain, claudication, leg swelling and syncope.  Gastrointestinal: Negative for abdominal pain and vomiting.  All other systems reviewed and are negative.    Physical Exam Updated Vital Signs BP (!) 187/93   Pulse 99   Temp (!) 97.2 F (36.2 C) (Axillary)   Resp 20   Wt 63.5 kg (140 lb)   SpO2 98%   BMI 20.67 kg/m   Physical Exam  Constitutional: He is oriented to person, place, and time. He appears well-developed and well-nourished. No distress.  HENT:  Head: Normocephalic and atraumatic.  Right Ear: External ear normal.  Left Ear: External ear normal.  Mouth/Throat: Oropharynx is clear and moist.  Eyes: Pupils are equal, round, and reactive to light. Conjunctivae and EOM are normal. Right eye exhibits no discharge.  Neck: Normal range of motion. Neck supple.  Cardiovascular: Regular rhythm, normal heart sounds and intact distal pulses. Tachycardia present.  No murmur heard. Pulmonary/Chest: Accessory muscle usage present. Tachypnea noted. No respiratory distress. He has no decreased breath sounds. He has wheezes.  He has no rhonchi. He has no rales.  Still able to speak in shortened sentences  Abdominal: Soft. There is no tenderness.  Musculoskeletal: Normal range of motion. He exhibits no edema or tenderness.  Neurological: He is alert and oriented to person, place, and time.  Skin: Skin is warm and dry. No rash noted.  Psychiatric: He has a normal mood and affect.     ED Treatments / Results  Labs (all labs ordered are listed, but only abnormal results are displayed) Labs Reviewed  BASIC METABOLIC PANEL - Abnormal; Notable for the following components:      Result Value   Creatinine, Ser 1.28 (*)    GFR calc non Af Amer 58 (*)    All other components within normal limits  I-STAT VENOUS BLOOD GAS, ED - Abnormal; Notable for the following components:   pH, Ven 7.208 (*)    pO2, Ven 55.0 (*)    Bicarbonate 19.9 (*)  TCO2 21 (*)    Acid-base deficit 8.0 (*)    All other components within normal limits  CBC    EKG EKG Interpretation  Date/Time:  Friday Mar 23 2018 07:27:20 EDT Ventricular Rate:  100 PR Interval:    QRS Duration: 103 QT Interval:  368 QTC Calculation: 475 R Axis:   96 Text Interpretation:  Sinus tachycardia Multiform ventricular premature complexes Right axis deviation Anteroseptal infarct, old ST elevation, consider inferior injury No significant change since last tracing Confirmed by Blanchie Dessert [phone]) on 03/23/2018 7:50:34 AM   Radiology Dg Chest Port 1 View  Result Date: 03/23/2018 CLINICAL DATA:  Shortness of breath.  COPD. EXAM: PORTABLE CHEST 1 VIEW COMPARISON:  05/20/2017 FINDINGS: The heart size and mediastinal contours are within normal limits. Both lungs are clear but hyperinflated. The visualized skeletal structures are unremarkable. IMPRESSION: Hyperinflated lungs consistent with the history of COPD. No acute abnormalities. Electronically Signed   By: Lorriane Shire M.D.   On: 03/23/2018 08:35    Procedures Procedures (including critical care  time)  Medications Ordered in ED Medications  sodium chloride 0.9 % bolus 1,000 mL (has no administration in time range)  albuterol (PROVENTIL,VENTOLIN) solution continuous neb (has no administration in time range)  ipratropium (ATROVENT) nebulizer solution 0.5 mg (has no administration in time range)  magnesium sulfate IVPB 2 g 50 mL (has no administration in time range)     Initial Impression / Assessment and Plan / ED Course  I have reviewed the triage vital signs and the nursing notes.  Pertinent labs & imaging results that were available during my care of the patient were reviewed by me and considered in my medical decision making (see chart for details).     Patient is a 62 year old male with a significant past medical history of COPD with prior intubation who continues to smoke presenting with 4 days of worsening shortness of breath and wheezing.  He is having some scant clear sputum but no fevers.  This all started after he mowed the yard without a mask on Tuesday.  Patient has been using his inhaler and nebulizer at home without improvement.  Patient called 911 today due to worsening shortness of breath.  Patient was given Solu-Medrol 125, 10 mg of albuterol and 1 mg of Atrovent.  Patient is able to speak in full sentences here but is still using accessory muscles.  He is wheezing diffusely but does not have evidence of fluid overload and denies chest pain.  EKG is unchanged today and low suspicion that this is cardiac in nature such as ACS or CHF.  Low suspicion for PE.  Feel this is a COPD exacerbation.  Patient placed on a continuous 1 hour long albuterol neb and given more Atrovent.  Patient also given fluids and magnesium. CBC, BMP, VBG pending.  If patient deteriorates we will start BiPAP  9:58 AM Labs and x-ray without significant changes.  EKG without acute findings.  After first round of nebs and hour-long continuous albuterol patient is feeling better.  Accessory muscle use  is now gone but still wheezing.  We will continue to monitor.  12:37 PM Patient feeling much better after above treatments.  Able to ambulate with oxygen saturation remaining in the 90s.  Patient is requesting discharge.  Sent home with a new inhaler and prednisone.  Final Clinical Impressions(s) / ED Diagnoses   Final diagnoses:  COPD exacerbation [facility])    ED Discharge Orders  Ordered    predniSONE (DELTASONE) 20 MG tablet  Daily     03/23/18 1235       Blanchie Dessert, MD 03/23/18 1238

## 2018-03-23 NOTE — ED Notes (Signed)
Pt ambulated 149ft  down hallway with no distress.  HR was 100 SpO2 was 92 RA.  Pt stated feeling normal when we returned to the room.

## 2018-03-23 NOTE — ED Notes (Signed)
Patient given discharge instructions and verbalized understanding.  Patient stable to discharge at this time.  Patient is alert and oriented to baseline.  No distressed noted at this time.  All belongings taken with the patient at discharge.   

## 2018-03-23 NOTE — ED Notes (Signed)
Pt given Sprite per Gerald Stabs (RN)

## 2018-04-05 ENCOUNTER — Emergency Department (HOSPITAL_COMMUNITY)
Admission: EM | Admit: 2018-04-05 | Discharge: 2018-04-06 | Disposition: A | Discharge status: Home / Self Care | Payer: Medicare HMO | Attending: Emergency Medicine | Admitting: Emergency Medicine

## 2018-04-05 ENCOUNTER — Encounter (HOSPITAL_COMMUNITY): Payer: Self-pay | Admitting: Emergency Medicine

## 2018-04-05 ENCOUNTER — Other Ambulatory Visit: Payer: Self-pay

## 2018-04-05 DIAGNOSIS — F17210 Nicotine dependence, cigarettes, uncomplicated: Secondary | ICD-10-CM | POA: Insufficient documentation

## 2018-04-05 DIAGNOSIS — T783XXA Angioneurotic edema, initial encounter: Secondary | ICD-10-CM | POA: Insufficient documentation

## 2018-04-05 DIAGNOSIS — I10 Essential (primary) hypertension: Secondary | ICD-10-CM | POA: Insufficient documentation

## 2018-04-05 DIAGNOSIS — J449 Chronic obstructive pulmonary disease, unspecified: Secondary | ICD-10-CM | POA: Diagnosis not present

## 2018-04-05 DIAGNOSIS — R220 Localized swelling, mass and lump, head: Secondary | ICD-10-CM | POA: Diagnosis not present

## 2018-04-05 DIAGNOSIS — Z7982 Long term (current) use of aspirin: Secondary | ICD-10-CM | POA: Diagnosis not present

## 2018-04-05 MED ORDER — DEXAMETHASONE 4 MG PO TABS
10.0000 mg | ORAL_TABLET | Freq: Once | ORAL | Status: AC
Start: 2018-04-06 — End: 2018-04-05
  Administered 2018-04-05: 10 mg via ORAL
  Filled 2018-04-05: qty 3

## 2018-04-05 MED ORDER — ALBUTEROL SULFATE (2.5 MG/3ML) 0.083% IN NEBU
5.0000 mg | INHALATION_SOLUTION | Freq: Once | RESPIRATORY_TRACT | Status: AC
  Administered 2018-04-05: 5 mg via RESPIRATORY_TRACT
  Filled 2018-04-05: qty 6

## 2018-04-05 NOTE — ED Triage Notes (Signed)
Patient with oral swelling and tongue swelling.  Patient does take lisinopril .  Swelling noted circumorally.  He states that he had a funny taste in his mouth, metal in nature.  Patient does have COPD and states he is having some shortness of breath but not worse than usual.

## 2018-04-05 NOTE — ED Notes (Signed)
ED Provider at bedside. 

## 2018-04-05 NOTE — Discharge Instructions (Signed)
Do not take lisinopril or another ACE inhibitor - you are allergic to this medicine.  Get rechecked immediately if you develop difficulty breathing, swelling of your throat or tongue.

## 2018-04-05 NOTE — ED Provider Notes (Signed)
[facility] EMERGENCY DEPARTMENT Provider Note   CSN: [id number] Arrival date & time: 04/05/18  1946     History   Chief Complaint Chief Complaint  Patient presents with  . Facial Swelling    HPI [name] is a 62 y.o. male.  The history is provided by the patient. No language interpreter was used.    [name] is a 62 y.o. male who presents to the Emergency Department complaining of facial swelling. He reports swelling to his left upper lip that began last night and extended across his entire upper lip throughout the night. This morning he also had swelling to the lower lip and mild swelling to his tongue will change in his voice. He has been applying ice to his face and he feels like his swelling has mildly improved. He does take lisinopril daily. He denies any significant shortness of breath but does have sensation of his routine COPD and feels like he needs a breathing treatment. He has no fever, no pain. He states that prior to ED arrival his lip was as swollen as a hot dog bun but now it looks better.  Sxs are moderate to severe, improving.  His last dose of lisinopril was yesterday morning.    Past Medical History:  Diagnosis Date  . Asthma   . COPD (chronic obstructive pulmonary disease) ([place])   . Hypertension   . Shortness of breath     Patient Active Problem List   Diagnosis Date Noted  . Primary hypertension   . Lymph node enlargement 12/10/2011  . Tobacco use disorder 08/04/2009  . Erectile dysfunction 12/31/2008  . COPD (chronic obstructive pulmonary disease) ([place]) 12/31/2008  . History of colonic polyps 01/18/2007    Past Surgical History:  Procedure Laterality Date  . COLONOSCOPY          Home Medications    Prior to Admission medications   Medication Sig Start Date End Date Taking? Authorizing Provider  acetaminophen (TYLENOL) 500 MG tablet Take 1,000 mg by mouth as needed for mild pain.    [provider]    albuterol (PROVENTIL HFA;VENTOLIN HFA) 108 (90 Base) MCG/ACT inhaler Inhale 2-4 puffs into the lungs every 4 (four) hours as needed for wheezing (or cough). 11/24/17   [place] Bing, DO  albuterol (PROVENTIL) (2.5 MG/3ML) 0.083% nebulizer solution USE 1 VIAL IN NEBULIZER 4 TIMES A DAY AS NEEDED FOR SHORTNESS OF BREATH 02/27/18   [place] Bing, DO  amLODipine (NORVASC) 5 MG tablet Take 1 tablet (5 mg total) by mouth daily. 03/13/18   [place] Bing, DO  aspirin 81 MG EC tablet Take 1 tablet (81 mg total) by mouth daily. 10/04/16   Mayo, Pete Pelt, MD  Chlorpheniramine Maleate (ALLERGY PO) Take 1 tablet by mouth daily. Allergy pill from the dollar store    [provider]  dextromethorphan-guaiFENesin (MUCINEX DM) 30-600 MG 12hr tablet Take 1 tablet by mouth as needed for cough (a needed for congestion).    [provider]  fexofenadine (ALLEGRA) 180 MG tablet Take 1 tablet (180 mg total) by mouth daily. Patient not taking: Reported on 05/20/2017 02/26/14   Frazier Richards, MD  fluticasone [facility]) 50 MCG/ACT nasal spray PLACE 2 SPRAYS INTO BOTH NOSTRILS DAILY. Patient not taking: Reported on 05/20/2017 10/04/16   Sela Hua, MD  Fluticasone-Salmeterol (ADVAIR DISKUS) 500-50 MCG/DOSE AEPB INHALE ONE PUFF INTO THE LUNGS TWICE A DAY Patient not taking: Reported on 03/23/2018 02/28/18  [place] Bing, DO  Fluticasone-Salmeterol (ADVAIR DISKUS) 500-50 MCG/DOSE AEPB INHALE ONE PUFF INTO THE LUNGS TWICE A DAY Patient not taking: Reported on 03/23/2018 03/01/18   [place] Bing, DO  Multiple Vitamin (MULTIVITAMIN WITH MINERALS) TABS tablet Take 1 tablet by mouth daily.    [provider]  predniSONE (DELTASONE) 20 MG tablet Take 2 tablets (40 mg total) by mouth daily. 03/23/18   Blanchie Dessert, MD  tiotropium (SPIRIVA HANDIHALER) 18 MCG inhalation capsule Place 1 capsule (18 mcg total) into inhaler and inhale daily. Patient not taking: Reported on 03/23/2018 01/10/18    [place] Bing, DO  traMADol (ULTRAM) 50 MG tablet Take 1 tablet (50 mg total) by mouth 2 (two) times daily as needed. Patient not taking: Reported on 03/23/2018 10/17/17   Petrucelli, Glynda Jaeger, PA-C    Family History Family History  Problem Relation Age of Onset  . Cancer Father     Social History Social History   Tobacco Use  . Smoking status: Current Every Day Smoker    Packs/day: 0.50    Years: 30.00    Pack years: 15.00    Types: Cigarettes    Last attempt to quit: 08/21/2013    Years since quitting: 4.6  . Smokeless tobacco: Never Used  Substance Use Topics  . Alcohol use: Yes    Comment: rare/occasional  . Drug use: No     Allergies   Lisinopril   Review of Systems Review of Systems  All other systems reviewed and are negative.    Physical Exam Updated Vital Signs BP 131/74   Pulse 73   Temp 98.4 F (36.9 C) (Oral)   Resp 15   Ht 5\' 9"  (1.753 m)   Wt 59 kg (130 lb)   SpO2 98%   BMI 19.20 kg/m   Physical Exam  Constitutional: He is oriented to person, place, and time. He appears well-developed and well-nourished.  HENT:  Head: Normocephalic and atraumatic.  Moderate edema to the upper lip and maxillary region, mild edema to the lower lip. Mild edema to the tongue. No edema in the posterior oropharynx.  Cardiovascular: Normal rate and regular rhythm.  No murmur heard. Pulmonary/Chest: Effort normal and breath sounds normal. No respiratory distress.  There is a slight change to his voice but is not muffled. No Strider.  Abdominal: Soft. There is no tenderness. There is no rebound and no guarding.  Musculoskeletal: He exhibits no edema or tenderness.  Neurological: He is alert and oriented to person, place, and time.  Skin: Skin is warm and dry.  Psychiatric: He has a normal mood and affect. His behavior is normal.  Nursing note and vitals reviewed.    ED Treatments / Results  Labs (all labs ordered are listed, but only abnormal results  are displayed) Labs Reviewed - No data to display  EKG None  Radiology No results found.  Procedures Procedures (including critical care time)  Medications Ordered in ED Medications  albuterol (PROVENTIL) (2.5 MG/3ML) 0.083% nebulizer solution 5 mg (5 mg Nebulization Given 04/05/18 2124)  dexamethasone (DECADRON) tablet 10 mg (10 mg Oral Given 04/05/18 2353)     Initial Impression / Assessment and Plan / ED Course  I have reviewed the triage vital signs and the nursing notes.  Pertinent labs & imaging results that were available during my care of the patient were reviewed by me and considered in my medical decision making (see chart for details).     Patient of facial swelling  that started yesterday. He is well appearing on examination and in no acute respiratory distress. He was observed in the emergency department for four hours. He had no worsening of the swelling and his tongue and voice appeared improved on repeat assessment. Discussed with patient ACE inhibitor angioedema. Patient concern that he has not received steroids for this, discussed that there is no documented benefits of steroid administration in this condition. Given he does have underlying COPD, question if there will be some benefit to his COPD with one-time dose of steroids. Discussed with patient discontinuing his lisinopril importance of close return precautions if he has any worsening of his swelling. Final Clinical Impressions(s) / ED Diagnoses   Final diagnoses:  Angioedema, initial encounter    ED Discharge Orders    None       Quintella Reichert, MD 04/06/18 [phone]

## 2018-04-06 NOTE — ED Notes (Signed)
Provided discharge instructions to patient, reinforced that he needs to stop taking lisinopril. Pt states that he just got a three month supply and he doesn't want to stop. Reinforced that lisinopril as that cause of his swelling and instructed patient to follow up with his PCP for new BP medication manangement. Patient left at this time with all belongings.

## 2018-06-18 ENCOUNTER — Encounter (HOSPITAL_COMMUNITY): Payer: Self-pay

## 2018-06-18 ENCOUNTER — Emergency Department (HOSPITAL_COMMUNITY)
Admission: EM | Admit: 2018-06-18 | Discharge: 2018-06-19 | Disposition: A | Discharge status: Home / Self Care | Payer: Medicare HMO | Attending: Emergency Medicine | Admitting: Emergency Medicine

## 2018-06-18 DIAGNOSIS — F17210 Nicotine dependence, cigarettes, uncomplicated: Secondary | ICD-10-CM | POA: Diagnosis not present

## 2018-06-18 DIAGNOSIS — Z79899 Other long term (current) drug therapy: Secondary | ICD-10-CM | POA: Insufficient documentation

## 2018-06-18 DIAGNOSIS — R0602 Shortness of breath: Secondary | ICD-10-CM | POA: Insufficient documentation

## 2018-06-18 DIAGNOSIS — I10 Essential (primary) hypertension: Secondary | ICD-10-CM | POA: Insufficient documentation

## 2018-06-18 DIAGNOSIS — J449 Chronic obstructive pulmonary disease, unspecified: Secondary | ICD-10-CM | POA: Diagnosis not present

## 2018-06-18 DIAGNOSIS — Z7982 Long term (current) use of aspirin: Secondary | ICD-10-CM | POA: Insufficient documentation

## 2018-06-18 DIAGNOSIS — Z760 Encounter for issue of repeat prescription: Secondary | ICD-10-CM

## 2018-06-18 LAB — CBC
HCT: 42.6 % (ref 39.0–52.0)
HEMOGLOBIN: 13.9 g/dL (ref 13.0–17.0)
MCH: 33 pg (ref 26.0–34.0)
MCHC: 32.6 g/dL (ref 30.0–36.0)
MCV: 101.2 fL — ABNORMAL HIGH (ref 78.0–100.0)
Platelets: 317 10*3/uL (ref 150–400)
RBC: 4.21 MIL/uL — AB (ref 4.22–5.81)
RDW: 11.3 % — ABNORMAL LOW (ref 11.5–15.5)
WBC: 5.2 10*3/uL (ref 4.0–10.5)

## 2018-06-18 LAB — BASIC METABOLIC PANEL
ANION GAP: 5 (ref 5–15)
BUN: 23 mg/dL (ref 8–23)
CALCIUM: 9.3 mg/dL (ref 8.9–10.3)
CO2: 27 mmol/L (ref 22–32)
Chloride: 110 mmol/L (ref 98–111)
Creatinine, Ser: 1.49 mg/dL — ABNORMAL HIGH (ref 0.61–1.24)
GFR, EST AFRICAN AMERICAN: 56 mL/min — AB (ref >60)
GFR, EST NON AFRICAN AMERICAN: 48 mL/min — AB (ref >60)
Glucose, Bld: 76 mg/dL (ref 70–99)
Potassium: 4.2 mmol/L (ref 3.5–5.1)
SODIUM: 142 mmol/L (ref 135–145)

## 2018-06-18 MED ORDER — ALBUTEROL SULFATE (2.5 MG/3ML) 0.083% IN NEBU
5.0000 mg | INHALATION_SOLUTION | Freq: Once | RESPIRATORY_TRACT | Status: AC
  Administered 2018-06-18: 5 mg via RESPIRATORY_TRACT
  Filled 2018-06-18: qty 6

## 2018-06-18 MED ORDER — ALBUTEROL SULFATE HFA 108 (90 BASE) MCG/ACT IN AERS
2.0000 | INHALATION_SPRAY | Freq: Once | RESPIRATORY_TRACT | Status: AC
  Administered 2018-06-19: 2 via RESPIRATORY_TRACT
  Filled 2018-06-18: qty 6.7

## 2018-06-18 NOTE — ED Triage Notes (Signed)
Pt states that he has COPD and he has been out of medication for a few days due to switching to new pharmacy.

## 2018-06-18 NOTE — ED Notes (Signed)
Pt ambulated with pulse ox, o2 maintained 96-100%.

## 2018-06-19 ENCOUNTER — Other Ambulatory Visit: Payer: Self-pay | Admitting: *Deleted

## 2018-06-19 DIAGNOSIS — J441 Chronic obstructive pulmonary disease with (acute) exacerbation: Secondary | ICD-10-CM

## 2018-06-19 MED ORDER — DEXAMETHASONE SODIUM PHOSPHATE 10 MG/ML IJ SOLN
10.0000 mg | Freq: Once | INTRAMUSCULAR | Status: AC
  Administered 2018-06-19: 10 mg via INTRAMUSCULAR
  Filled 2018-06-19: qty 1

## 2018-06-19 MED ORDER — DEXAMETHASONE SODIUM PHOSPHATE 4 MG/ML IJ SOLN: 10.0000 mg | Freq: Once | INTRAMUSCULAR | Status: DC

## 2018-06-19 NOTE — Discharge Instructions (Addendum)
Were seen today for shortness of breath after being out of your meds for 4 days.  Call your pharmacy to ensure that you can get your meds.  Will be discharged with an albuterol inhaler.

## 2018-06-19 NOTE — ED Provider Notes (Signed)
[facility] EMERGENCY DEPARTMENT Provider Note   CSN: [id number] Arrival date & time: 06/18/18  2122     History   Chief Complaint Chief Complaint  Patient presents with  . COPD    HPI [name] is a 62 y.o. male.  HPI  This is a 62 year old male with history of hypertension, COPD who presents with shortness of breath.  Patient reports that he transferred his medications from [facility] to [place] in order not to have to pay a co-pay.  Unfortunately, he has been out of his meds for the last 4 days as he has not received his meds through the pharmacy.  He has had progressive shortness of breath.  Denies cough or fever.  He states he feels much better after receiving an albuterol treatment from triage.  He states he feels that he is at his baseline.  He has not had any chest pain.  He normally takes albuterol as needed, Advair and Spiriva.  Past Medical History:  Diagnosis Date  . Asthma   . COPD (chronic obstructive pulmonary disease) ([place])   . Hypertension   . Shortness of breath     Patient Active Problem List   Diagnosis Date Noted  . Primary hypertension   . Lymph node enlargement 12/10/2011  . Tobacco use disorder 08/04/2009  . Erectile dysfunction 12/31/2008  . COPD (chronic obstructive pulmonary disease) ([place]) 12/31/2008  . History of colonic polyps 01/18/2007    Past Surgical History:  Procedure Laterality Date  . COLONOSCOPY          Home Medications    Prior to Admission medications   Medication Sig Start Date End Date Taking? Authorizing Provider  acetaminophen (TYLENOL) 500 MG tablet Take 1,000 mg by mouth as needed for mild pain.    [provider]  albuterol (PROVENTIL HFA;VENTOLIN HFA) 108 (90 Base) MCG/ACT inhaler Inhale 2-4 puffs into the lungs every 4 (four) hours as needed for wheezing (or cough). 11/24/17   [place] Bing, DO  albuterol (PROVENTIL) (2.5 MG/3ML) 0.083% nebulizer solution USE 1 VIAL IN  NEBULIZER 4 TIMES A DAY AS NEEDED FOR SHORTNESS OF BREATH 02/27/18   [place] Bing, DO  amLODipine (NORVASC) 5 MG tablet Take 1 tablet (5 mg total) by mouth daily. 03/13/18   [place] Bing, DO  aspirin 81 MG EC tablet Take 1 tablet (81 mg total) by mouth daily. 10/04/16   Mayo, Pete Pelt, MD  Chlorpheniramine Maleate (ALLERGY PO) Take 1 tablet by mouth daily. Allergy pill from the dollar store    [provider]  dextromethorphan-guaiFENesin (MUCINEX DM) 30-600 MG 12hr tablet Take 1 tablet by mouth as needed for cough (a needed for congestion).    [provider]  fexofenadine (ALLEGRA) 180 MG tablet Take 1 tablet (180 mg total) by mouth daily. Patient not taking: Reported on 05/20/2017 02/26/14   Frazier Richards, MD  fluticasone [facility]) 50 MCG/ACT nasal spray PLACE 2 SPRAYS INTO BOTH NOSTRILS DAILY. Patient not taking: Reported on 05/20/2017 10/04/16   Mayo[place]ete Pelt, MD  Fluticasone-Salmeterol (ADVAIR DISKUS) 500-50 MCG/DOSE AEPB INHALE ONE PUFF INTO THE LUNGS TWICE A DAY Patient not taking: Reported on 03/23/2018 02/28/18   [place] Bing, DO  Fluticasone-Salmeterol (ADVAIR DISKUS) 500-50 MCG/DOSE AEPB INHALE ONE PUFF INTO THE LUNGS TWICE A DAY Patient not taking: Reported on 03/23/2018 03/01/18    Bing, DO  Multiple Vitamin (MULTIVITAMIN WITH MINERALS) TABS tablet Take 1 tablet by mouth daily.  [provider]  predniSONE (DELTASONE) 20 MG tablet Take 2 tablets (40 mg total) by mouth daily. 03/23/18   Blanchie Dessert, MD  tiotropium (SPIRIVA HANDIHALER) 18 MCG inhalation capsule Place 1 capsule (18 mcg total) into inhaler and inhale daily. Patient not taking: Reported on 03/23/2018 01/10/18   [place] Bing, DO  traMADol (ULTRAM) 50 MG tablet Take 1 tablet (50 mg total) by mouth 2 (two) times daily as needed. Patient not taking: Reported on 03/23/2018 10/17/17   Petrucelli, Glynda Jaeger, PA-C    Family History Family History  Problem Relation Age of  Onset  . Cancer Father     Social History Social History   Tobacco Use  . Smoking status: Current Every Day Smoker    Packs/day: 0.50    Years: 30.00    Pack years: 15.00    Types: Cigarettes    Last attempt to quit: 08/21/2013    Years since quitting: 4.8  . Smokeless tobacco: Never Used  Substance Use Topics  . Alcohol use: Yes    Comment: rare/occasional  . Drug use: No     Allergies   Lisinopril   Review of Systems Review of Systems  Constitutional: Negative for fever.  Respiratory: Positive for shortness of breath. Negative for cough.   Cardiovascular: Negative for chest pain and leg swelling.  Gastrointestinal: Negative for abdominal pain, nausea and vomiting.  Neurological: Negative for headaches.  All other systems reviewed and are negative.    Physical Exam Updated Vital Signs BP (!) 175/91   Pulse 67   Temp 98.1 F (36.7 C) (Oral)   Resp 16   SpO2 98%   Physical Exam  Constitutional: He is oriented to person, place, and time. He appears well-developed and well-nourished. No distress.  HENT:  Head: Normocephalic and atraumatic.  Eyes: Pupils are equal, round, and reactive to light.  Neck: Neck supple.  Cardiovascular: Normal rate, regular rhythm and normal heart sounds.  No murmur heard. Pulmonary/Chest: Effort normal. No respiratory distress. He has wheezes.  Fair air movement, only an occasional expiratory wheeze  Abdominal: Soft. Bowel sounds are normal. There is no tenderness. There is no rebound.  Musculoskeletal: He exhibits no edema.  Lymphadenopathy:    He has no cervical adenopathy.  Neurological: He is alert and oriented to person, place, and time.  Skin: Skin is warm and dry.  Psychiatric: He has a normal mood and affect.  Nursing note and vitals reviewed.    ED Treatments / Results  Labs (all labs ordered are listed, but only abnormal results are displayed) Labs Reviewed  BASIC METABOLIC PANEL - Abnormal; Notable for the  following components:      Result Value   Creatinine, Ser 1.49 (*)    GFR calc non Af Amer 48 (*)    GFR calc Af Amer 56 (*)    All other components within normal limits  CBC - Abnormal; Notable for the following components:   RBC 4.21 (*)    MCV 101.2 (*)    RDW 11.3 (*)    All other components within normal limits    EKG EKG Interpretation  Date/Time:  Monday June 18 2018 21:33:03 EDT Ventricular Rate:  75 PR Interval:  148 QRS Duration: 98 QT Interval:  384 QTC Calculation: 428 R Axis:   69 Text Interpretation:  Normal sinus rhythm Minimal voltage criteria for LVH, may be normal variant Nonspecific T wave abnormality Abnormal ECG Confirmed by Thayer Jew [phone]) on 06/18/2018 11:12:19 PM  Radiology No results found.  Procedures Procedures (including critical care time)  Medications Ordered in ED Medications  dexamethasone (DECADRON) injection 10 mg (has no administration in time range)  albuterol (PROVENTIL) (2.5 MG/3ML) 0.083% nebulizer solution 5 mg (5 mg Nebulization Given 06/18/18 2134)  albuterol (PROVENTIL HFA;VENTOLIN HFA) 108 (90 Base) MCG/ACT inhaler 2 puff (2 puffs Inhalation Given 06/19/18 0001)     Initial Impression / Assessment and Plan / ED Course  I have reviewed the triage vital signs and the nursing notes.  Pertinent labs & imaging results that were available during my care of the patient were reviewed by me and considered in my medical decision making (see chart for details).     Patient presents with shortness of breath.  Has been out of his COPD medications for the last 4 days.  He is overall nontoxic-appearing and vital signs are reassuring.  He satting 98% on room air and is in no acute distress.  Patient reports significant improvement after 1 DuoNeb.  At this time he has good air movement and with only an occasional wheeze.  I have ordered him an albuterol inhaler.  He ambulated and maintain his pulse ox greater than 96% without  difficulty.  We will allow him to go home with albuterol inhaler.  I have encouraged him to follow-up with his pharmacy to ensure to get his medications otherwise.  Additionally, he was given 1 dose of IM Decadron.  At this time I do not feel strongly that this was an actual COPD exacerbation but likely more a result of not having his maintenance medication.  After history, exam, and medical workup I feel the patient has been appropriately medically screened and is safe for discharge home. Pertinent diagnoses were discussed with the patient. Patient was given return precautions.   Final Clinical Impressions(s) / ED Diagnoses   Final diagnoses:  Shortness of breath  Encounter for medication refill    ED Discharge Orders    None       Horton, Barbette Hair, MD 06/19/18 0006

## 2018-06-20 MED ORDER — ALBUTEROL SULFATE HFA 108 (90 BASE) MCG/ACT IN AERS: 2.0000 | INHALATION_SPRAY | RESPIRATORY_TRACT | 3 refills | Status: DC | PRN

## 2018-06-20 MED ORDER — ALBUTEROL SULFATE (2.5 MG/3ML) 0.083% IN NEBU
INHALATION_SOLUTION | RESPIRATORY_TRACT | 3 refills | Status: DC
Start: 2018-06-20 — End: 2019-03-11

## 2018-07-17 ENCOUNTER — Ambulatory Visit: Payer: Medicare HMO | Admitting: Family Medicine

## 2018-07-17 NOTE — Progress Notes (Deleted)
   Subjective   Patient ID: [name]    DOB: 08-04-1956, 62 y.o. male   MRN: [id number]  CC: "***"  HPI: [name] is a 62 y.o. male who presents to clinic today for the following:  ***: ***  ***Needs HCV screen, HIV screen, Tdap.  Multiple ED visits for COPD exacerbations.  ROS: see HPI for pertinent.  [place]: COPD, HTN, tobacco use disorder, ED. Surgical history unremarkable.  Family history cancer (father, unspecified).  Smoking status reviewed. Medications reviewed.  Objective   There were no vitals taken for this visit. Vitals and nursing note reviewed.  General: well nourished, well developed, NAD with non-toxic appearance HEENT: normocephalic, atraumatic, moist mucous membranes Neck: supple, non-tender without lymphadenopathy Cardiovascular: regular rate and rhythm without murmurs, rubs, or gallops Lungs: clear to auscultation bilaterally with normal work of breathing Abdomen: soft, non-tender, non-distended, normoactive bowel sounds Skin: warm, dry, no rashes or lesions, cap refill < 2 seconds Extremities: warm and well perfused, normal tone, no edema  Assessment & Plan   No problem-specific Assessment & Plan notes found for this encounter.  No orders of the defined types were placed in this encounter.  No orders of the defined types were placed in this encounter.   Harriet [place]te, Ruth, PGY-3 07/17/2018, 1:29 PM

## 2018-07-18 ENCOUNTER — Emergency Department (HOSPITAL_COMMUNITY): Payer: Medicare HMO

## 2018-07-18 ENCOUNTER — Emergency Department (HOSPITAL_COMMUNITY)
Admission: EM | Admit: 2018-07-18 | Discharge: 2018-07-18 | Disposition: A | Discharge status: Home / Self Care | Payer: Medicare HMO | Attending: Emergency Medicine | Admitting: Emergency Medicine

## 2018-07-18 DIAGNOSIS — R0689 Other abnormalities of breathing: Secondary | ICD-10-CM | POA: Diagnosis not present

## 2018-07-18 DIAGNOSIS — Z79899 Other long term (current) drug therapy: Secondary | ICD-10-CM | POA: Insufficient documentation

## 2018-07-18 DIAGNOSIS — F17210 Nicotine dependence, cigarettes, uncomplicated: Secondary | ICD-10-CM | POA: Diagnosis not present

## 2018-07-18 DIAGNOSIS — R0600 Dyspnea, unspecified: Secondary | ICD-10-CM | POA: Diagnosis present

## 2018-07-18 DIAGNOSIS — N289 Disorder of kidney and ureter, unspecified: Secondary | ICD-10-CM | POA: Diagnosis not present

## 2018-07-18 DIAGNOSIS — R0602 Shortness of breath: Secondary | ICD-10-CM | POA: Diagnosis not present

## 2018-07-18 DIAGNOSIS — J441 Chronic obstructive pulmonary disease with (acute) exacerbation: Secondary | ICD-10-CM

## 2018-07-18 DIAGNOSIS — I10 Essential (primary) hypertension: Secondary | ICD-10-CM | POA: Insufficient documentation

## 2018-07-18 DIAGNOSIS — Z7982 Long term (current) use of aspirin: Secondary | ICD-10-CM | POA: Insufficient documentation

## 2018-07-18 DIAGNOSIS — I499 Cardiac arrhythmia, unspecified: Secondary | ICD-10-CM | POA: Diagnosis not present

## 2018-07-18 LAB — CBC
HCT: 43.8 % (ref 39.0–52.0)
Hemoglobin: 14 g/dL (ref 13.0–17.0)
MCH: 32.2 pg (ref 26.0–34.0)
MCHC: 32 g/dL (ref 30.0–36.0)
MCV: 100.7 fL — ABNORMAL HIGH (ref 78.0–100.0)
Platelets: 261 K/uL (ref 150–400)
RBC: 4.35 MIL/uL (ref 4.22–5.81)
RDW: 11.6 % (ref 11.5–15.5)
WBC: 8.2 K/uL (ref 4.0–10.5)

## 2018-07-18 LAB — DIFFERENTIAL
Abs Immature Granulocytes: 0 K/uL (ref 0.0–0.1)
Basophils Absolute: 0.1 K/uL (ref 0.0–0.1)
Basophils Relative: 1 %
Eosinophils Absolute: 0.8 K/uL — ABNORMAL HIGH (ref 0.0–0.7)
Eosinophils Relative: 10 %
Immature Granulocytes: 0 %
Lymphocytes Relative: 51 %
Lymphs Abs: 4.2 K/uL — ABNORMAL HIGH (ref 0.7–4.0)
Monocytes Absolute: 0.8 K/uL (ref 0.1–1.0)
Monocytes Relative: 9 %
Neutro Abs: 2.4 K/uL (ref 1.7–7.7)
Neutrophils Relative %: 29 %

## 2018-07-18 LAB — BASIC METABOLIC PANEL
ANION GAP: 8 (ref 5–15)
BUN: 27 mg/dL — ABNORMAL HIGH (ref 8–23)
CALCIUM: 9.3 mg/dL (ref 8.9–10.3)
CO2: 24 mmol/L (ref 22–32)
Chloride: 111 mmol/L (ref 98–111)
Creatinine, Ser: 1.88 mg/dL — ABNORMAL HIGH (ref 0.61–1.24)
GFR, EST AFRICAN AMERICAN: 42 mL/min — AB (ref >60)
GFR, EST NON AFRICAN AMERICAN: 36 mL/min — AB (ref >60)
Glucose, Bld: 83 mg/dL (ref 70–99)
Potassium: 4.2 mmol/L (ref 3.5–5.1)
SODIUM: 143 mmol/L (ref 135–145)

## 2018-07-18 MED ORDER — ALBUTEROL SULFATE (2.5 MG/3ML) 0.083% IN NEBU: 2.5000 mg | INHALATION_SOLUTION | Freq: Four times a day (QID) | RESPIRATORY_TRACT | Status: DC | PRN

## 2018-07-18 MED ORDER — IPRATROPIUM-ALBUTEROL 0.5-2.5 (3) MG/3ML IN SOLN: 3.0000 mL | Freq: Three times a day (TID) | RESPIRATORY_TRACT | Status: DC

## 2018-07-18 MED ORDER — AZITHROMYCIN 250 MG PO TABS: ORAL_TABLET | ORAL | 0 refills | Status: DC

## 2018-07-18 MED ORDER — ALBUTEROL SULFATE (2.5 MG/3ML) 0.083% IN NEBU
5.0000 mg | INHALATION_SOLUTION | Freq: Once | RESPIRATORY_TRACT | Status: DC
  Filled 2018-07-18 (×2): qty 6

## 2018-07-18 MED ORDER — ALBUTEROL SULFATE (2.5 MG/3ML) 0.083% IN NEBU
2.5000 mg | INHALATION_SOLUTION | Freq: Once | RESPIRATORY_TRACT | Status: AC
  Administered 2018-07-18: 2.5 mg via RESPIRATORY_TRACT

## 2018-07-18 MED ORDER — SODIUM CHLORIDE 0.9 % IV BOLUS
1000.0000 mL | Freq: Once | INTRAVENOUS | Status: AC
  Administered 2018-07-18: 1000 mL via INTRAVENOUS

## 2018-07-18 MED ORDER — PREDNISONE 50 MG PO TABS: 50.0000 mg | ORAL_TABLET | Freq: Every day | ORAL | 0 refills | Status: DC

## 2018-07-18 MED ORDER — IPRATROPIUM-ALBUTEROL 0.5-2.5 (3) MG/3ML IN SOLN
3.0000 mL | Freq: Once | RESPIRATORY_TRACT | Status: DC
  Filled 2018-07-18: qty 3

## 2018-07-18 NOTE — ED Triage Notes (Addendum)
Patient [name] for respiratory distress x 2 hours. Per EMS patient called out for SOB and reports HX of asthma/COPD. Patient states he felt like he couldn't breath. Given 10 albuterol, 1 mg Atrovent, 125 mg solumedrol. Patient denies chest pain and abdominal pain. Patient A & O x 4.   BP 240/120 HR 92-100

## 2018-07-18 NOTE — ED Notes (Signed)
Patient taken off BiPAP by RT. Sats 99% on 2L. Tolerating well at this time.

## 2018-07-18 NOTE — ED Notes (Signed)
Patient placed on BiPAP upon arrival.

## 2018-07-18 NOTE — ED Notes (Signed)
Pt ambulating in hallway with emt

## 2018-07-18 NOTE — Progress Notes (Signed)
Took patient off of Bipap per MD order.  Placed patient on 2L [place].  Patient sating 99% with stable vital signs.  Patient stated that he felt better.

## 2018-07-18 NOTE — ED Notes (Signed)
Patient states he did not take his BP medication today.

## 2018-07-18 NOTE — Discharge Instructions (Addendum)
Continue using your inhalers as needed.  Your kidney test (creatinine) is a little worse today. Please have your doctor recheck it in the next 1-2 weeks.

## 2018-07-18 NOTE — ED Provider Notes (Signed)
[facility] EMERGENCY DEPARTMENT Provider Note   CSN: [id number] Arrival date & time: 07/18/18  0341     History   Chief Complaint Chief Complaint  Patient presents with  . Respiratory Distress    HPI [name] is a 62 y.o. male.  The history is provided by the patient.  He has a history of hypertension and COPD and comes in with difficulty breathing.  2 days ago, he started to get a cold with nonproductive cough, chills, sweats.  Difficulty breathing got worse today.  Initially he was getting relief with his home nebulizer treatments, but they stopped helping him this evening which is when he called for ambulance.  He is not aware of any fever.  He denies any chest pain.  Denies vomiting or diarrhea.  Denies arthralgias or myalgias.  There have been no sick contacts.  He does continue to smoke 1/4 pack cigarettes a day.  EMS treated him with placing him on CPAP and giving albuterol via nebulizer and intravenous methylprednisolone.  Past Medical History:  Diagnosis Date  . Asthma   . COPD (chronic obstructive pulmonary disease) ([place])   . Hypertension   . Shortness of breath     Patient Active Problem List   Diagnosis Date Noted  . Primary hypertension   . Lymph node enlargement 12/10/2011  . Tobacco use disorder 08/04/2009  . Erectile dysfunction 12/31/2008  . COPD (chronic obstructive pulmonary disease) ([place]) 12/31/2008  . History of colonic polyps 01/18/2007    Past Surgical History:  Procedure Laterality Date  . COLONOSCOPY          Home Medications    Prior to Admission medications   Medication Sig Start Date End Date Taking? Authorizing Provider  acetaminophen (TYLENOL) 500 MG tablet Take 1,000 mg by mouth as needed for mild pain.    [provider]  albuterol (PROVENTIL HFA;VENTOLIN HFA) 108 (90 Base) MCG/ACT inhaler Inhale 2-4 puffs into the lungs every 4 (four) hours as needed for wheezing (or cough). 06/20/18   [place] Bing, DO  albuterol (PROVENTIL) (2.5 MG/3ML) 0.083% nebulizer solution USE 1 VIAL IN NEBULIZER 4 TIMES A DAY AS NEEDED FOR SHORTNESS OF BREATH 06/20/18   [place] Bing, DO  amLODipine (NORVASC) 5 MG tablet Take 1 tablet (5 mg total) by mouth daily. 03/13/18   [place] Bing, DO  aspirin 81 MG EC tablet Take 1 tablet (81 mg total) by mouth daily. 10/04/16   Mayo, Pete Pelt, MD  Chlorpheniramine Maleate (ALLERGY PO) Take 1 tablet by mouth daily. Allergy pill from the dollar store    [provider]  dextromethorphan-guaiFENesin (MUCINEX DM) 30-600 MG 12hr tablet Take 1 tablet by mouth as needed for cough (a needed for congestion).    [provider]  fexofenadine (ALLEGRA) 180 MG tablet Take 1 tablet (180 mg total) by mouth daily. Patient not taking: Reported on 05/20/2017 02/26/14   Frazier Richards, MD  fluticasone [facility]) 50 MCG/ACT nasal spray PLACE 2 SPRAYS INTO BOTH NOSTRILS DAILY. Patient not taking: Reported on 05/20/2017 10/04/16   Sela Hua, MD  Fluticasone-Salmeterol (ADVAIR DISKUS) 500-50 MCG/DOSE AEPB INHALE ONE PUFF INTO THE LUNGS TWICE A DAY Patient not taking: Reported on 03/23/2018 02/28/18   [place] Bing, DO  Fluticasone-Salmeterol (ADVAIR DISKUS) 500-50 MCG/DOSE AEPB INHALE ONE PUFF INTO THE LUNGS TWICE A DAY Patient not taking: Reported on 03/23/2018 03/01/18   [place] Bing, DO  Multiple Vitamin (MULTIVITAMIN WITH MINERALS) TABS  tablet Take 1 tablet by mouth daily.    [provider]  predniSONE (DELTASONE) 20 MG tablet Take 2 tablets (40 mg total) by mouth daily. 03/23/18   Blanchie Dessert, MD  tiotropium (SPIRIVA HANDIHALER) 18 MCG inhalation capsule Place 1 capsule (18 mcg total) into inhaler and inhale daily. Patient not taking: Reported on 03/23/2018 01/10/18   [place] Bing, DO  traMADol (ULTRAM) 50 MG tablet Take 1 tablet (50 mg total) by mouth 2 (two) times daily as needed. Patient not taking: Reported on 03/23/2018 10/17/17    Petrucelli, Glynda Jaeger, PA-C    Family History Family History  Problem Relation Age of Onset  . Cancer Father     Social History Social History   Tobacco Use  . Smoking status: Current Every Day Smoker    Packs/day: 0.50    Years: 30.00    Pack years: 15.00    Types: Cigarettes    Last attempt to quit: 08/21/2013    Years since quitting: 4.9  . Smokeless tobacco: Never Used  Substance Use Topics  . Alcohol use: Yes    Comment: rare/occasional  . Drug use: No     Allergies   Lisinopril   Review of Systems Review of Systems  All other systems reviewed and are negative.    Physical Exam Updated Vital Signs BP (!) 209/113 (BP Location: Right Arm)   Pulse 97   Temp (!) 97.3 F (36.3 C) (Axillary)   Resp 20   Ht 5\' 9"  (1.753 m)   Wt 63.5 kg   SpO2 100%   BMI 20.67 kg/m   Physical Exam  Nursing note and vitals reviewed.  62 year old male on BiPAP resting comfortably and in no acute distress. Vital signs are significant for elevated blood pressure. Oxygen saturation is 100%, which is normal. Head is normocephalic and atraumatic. PERRLA, EOMI. Oropharynx is clear. Neck is nontender and supple without adenopathy or JVD. Back is nontender and there is no CVA tenderness. Lungs have decreased airflow with coarse expiratory wheezes at the bases.  No rales or rhonchi are appreciated. Chest is nontender. Heart has regular rate and rhythm without murmur. Abdomen is soft, flat, nontender without masses or hepatosplenomegaly and peristalsis is normoactive. Extremities have no cyanosis or edema, full range of motion is present. Skin is warm and dry without rash. Neurologic: Mental status is normal, cranial nerves are intact, there are no motor or sensory deficits.  ED Treatments / Results  Labs (all labs ordered are listed, but only abnormal results are displayed) Labs Reviewed  BASIC METABOLIC PANEL  CBC  CBC WITH DIFFERENTIAL/PLATELET    EKG EKG  Interpretation  Date/Time:  Wednesday July 18 2018 03:55:10 EDT Ventricular Rate:  97 PR Interval:    QRS Duration: 103 QT Interval:  375 QTC Calculation: 477 R Axis:   77 Text Interpretation:  Sinus rhythm Probable left ventricular hypertrophy Borderline prolonged QT interval When compared with ECG of 06/18/2018, QT has lengthened Confirmed by Delora Fuel ([id number]) on 07/18/2018 4:10:27 AM   Radiology No results found.  Procedures Procedures  CRITICAL CARE Performed by: Delora Fuel Total critical care time: 40 minutes Critical care time was exclusive of separately billable procedures and treating other patients. Critical care was necessary to treat or prevent imminent or life-threatening deterioration. Critical care was time spent personally by me on the following activities: development of treatment plan with patient and/or surrogate as well as nursing, discussions with consultants, evaluation of patient's response to treatment,  examination of patient, obtaining history from patient or surrogate, ordering and performing treatments and interventions, ordering and review of laboratory studies, ordering and review of radiographic studies, pulse oximetry and re-evaluation of patient's condition.  Medications Ordered in ED Medications  albuterol (PROVENTIL) (2.5 MG/3ML) 0.083% nebulizer solution 2.5 mg (has no administration in time range)  ipratropium-albuterol (DUONEB) 0.5-2.5 (3) MG/3ML nebulizer solution 3 mL (has no administration in time range)  albuterol (PROVENTIL) (2.5 MG/3ML) 0.083% nebulizer solution 2.5 mg (2.5 mg Nebulization Given 07/18/18 0424)  sodium chloride 0.9 % bolus 1,000 mL (0 mLs Intravenous Stopped 07/18/18 0650)     Initial Impression / Assessment and Plan / ED Course  I have reviewed the triage vital signs and the nursing notes.  Pertinent labs & imaging results that were available during my care of the patient were reviewed by me and considered in my medical  decision making (see chart for details).  Dyspnea and cough and patient with known COPD.  Old records are reviewed showing several ED visits and occasional hospitalization for COPD exacerbation.  Will check chest x-ray to rule out pneumonia.  He will be given additional albuterol with ipratropium.  5:36 AM He is resting comfortably now with out any use of accessory muscles.  Lungs sound clear.  We will try taking him off of BiPAP.  Of note, chest x-ray shows no evidence of pneumonia.  Labs do show slight worsening of renal function.  We will give some IV fluids.  7:08 AM Continues to tolerate being off BiPAP without any respiratory distress.  Lungs continue to be clear.  Will ambulate in the ED to see if he can tolerate that.  7:35 AM He tolerated ambulating in the ED without oxygen.  He is felt to be safe for discharge.  He is discharged with prescription for 5-day course of prednisone and also a prescription for azithromycin tablets.  Follow-up with his primary care provider in 5 days at which time decision can be made about whether to taper his steroid dose.  Return precautions discussed.  Final Clinical Impressions(s) / ED Diagnoses   Final diagnoses:  COPD exacerbation ([place])  Renal insufficiency    ED Discharge Orders         Ordered    predniSONE (DELTASONE) 50 MG tablet  Daily     07/18/18 0732    azithromycin (ZITHROMAX Z-PAK) 250 MG tablet     07/18/18 6433           Delora Fuel, MD 29/51/88 [phone]

## 2018-07-18 NOTE — ED Notes (Signed)
Pt o2 sat 96% @ bedside. During ambulation pt o2 sat 95%. Pt ambulated self efficiently with no difficulty. Pt o2 sat 96% returning to bedside. Pt returned to bedside safely.

## 2018-08-22 ENCOUNTER — Emergency Department (HOSPITAL_COMMUNITY)
Admission: EM | Admit: 2018-08-22 | Discharge: 2018-08-22 | Disposition: A | Discharge status: Home / Self Care | Payer: Medicare HMO | Attending: Emergency Medicine | Admitting: Emergency Medicine

## 2018-08-22 ENCOUNTER — Encounter (HOSPITAL_COMMUNITY): Payer: Self-pay | Admitting: Emergency Medicine

## 2018-08-22 ENCOUNTER — Emergency Department (HOSPITAL_COMMUNITY): Payer: Medicare HMO

## 2018-08-22 ENCOUNTER — Other Ambulatory Visit: Payer: Self-pay

## 2018-08-22 DIAGNOSIS — I10 Essential (primary) hypertension: Secondary | ICD-10-CM | POA: Diagnosis not present

## 2018-08-22 DIAGNOSIS — F17210 Nicotine dependence, cigarettes, uncomplicated: Secondary | ICD-10-CM | POA: Insufficient documentation

## 2018-08-22 DIAGNOSIS — Z720 Tobacco use: Secondary | ICD-10-CM

## 2018-08-22 DIAGNOSIS — R0602 Shortness of breath: Secondary | ICD-10-CM | POA: Diagnosis not present

## 2018-08-22 DIAGNOSIS — Z79899 Other long term (current) drug therapy: Secondary | ICD-10-CM | POA: Diagnosis not present

## 2018-08-22 DIAGNOSIS — Z7982 Long term (current) use of aspirin: Secondary | ICD-10-CM | POA: Diagnosis not present

## 2018-08-22 DIAGNOSIS — J441 Chronic obstructive pulmonary disease with (acute) exacerbation: Secondary | ICD-10-CM | POA: Diagnosis not present

## 2018-08-22 DIAGNOSIS — R05 Cough: Secondary | ICD-10-CM | POA: Diagnosis not present

## 2018-08-22 LAB — CBC
HCT: 45.9 % (ref 39.0–52.0)
Hemoglobin: 15.2 g/dL (ref 13.0–17.0)
MCH: 32.8 pg (ref 26.0–34.0)
MCHC: 33.1 g/dL (ref 30.0–36.0)
MCV: 98.9 fL (ref 78.0–100.0)
PLATELETS: 283 10*3/uL (ref 150–400)
RBC: 4.64 MIL/uL (ref 4.22–5.81)
RDW: 11.5 % (ref 11.5–15.5)
WBC: 6.2 10*3/uL (ref 4.0–10.5)

## 2018-08-22 LAB — BASIC METABOLIC PANEL
Anion gap: 9 (ref 5–15)
BUN: 21 mg/dL (ref 8–23)
CHLORIDE: 107 mmol/L (ref 98–111)
CO2: 21 mmol/L — ABNORMAL LOW (ref 22–32)
Calcium: 9.4 mg/dL (ref 8.9–10.3)
Creatinine, Ser: 1.52 mg/dL — ABNORMAL HIGH (ref 0.61–1.24)
GFR, EST AFRICAN AMERICAN: 55 mL/min — AB (ref >60)
GFR, EST NON AFRICAN AMERICAN: 47 mL/min — AB (ref >60)
Glucose, Bld: 72 mg/dL (ref 70–99)
POTASSIUM: 4.4 mmol/L (ref 3.5–5.1)
SODIUM: 137 mmol/L (ref 135–145)

## 2018-08-22 MED ORDER — AEROCHAMBER PLUS FLO-VU LARGE MISC
Status: AC
  Filled 2018-08-22: qty 1

## 2018-08-22 MED ORDER — AMOXICILLIN-POT CLAVULANATE 875-125 MG PO TABS: 1.0000 | ORAL_TABLET | Freq: Two times a day (BID) | ORAL | 0 refills | Status: DC

## 2018-08-22 MED ORDER — ALBUTEROL SULFATE HFA 108 (90 BASE) MCG/ACT IN AERS
2.0000 | INHALATION_SPRAY | Freq: Once | RESPIRATORY_TRACT | Status: AC
  Administered 2018-08-22: 2 via RESPIRATORY_TRACT
  Filled 2018-08-22: qty 6.7

## 2018-08-22 MED ORDER — ALBUTEROL (5 MG/ML) CONTINUOUS INHALATION SOLN
15.0000 mg/h | INHALATION_SOLUTION | Freq: Once | RESPIRATORY_TRACT | Status: AC
  Administered 2018-08-22: 15 mg/h via RESPIRATORY_TRACT
  Filled 2018-08-22: qty 20

## 2018-08-22 MED ORDER — AMOXICILLIN-POT CLAVULANATE 875-125 MG PO TABS
1.0000 | ORAL_TABLET | Freq: Once | ORAL | Status: AC
  Administered 2018-08-22: 1 via ORAL
  Filled 2018-08-22: qty 1

## 2018-08-22 MED ORDER — AEROCHAMBER PLUS FLO-VU MEDIUM MISC
1.0000 | Freq: Once | Status: AC
  Administered 2018-08-22: 1
  Filled 2018-08-22: qty 1

## 2018-08-22 MED ORDER — PREDNISONE 20 MG PO TABS: 20.0000 mg | ORAL_TABLET | Freq: Two times a day (BID) | ORAL | 0 refills | Status: DC

## 2018-08-22 MED ORDER — PREDNISONE 20 MG PO TABS
60.0000 mg | ORAL_TABLET | Freq: Once | ORAL | Status: AC
  Administered 2018-08-22: 60 mg via ORAL
  Filled 2018-08-22: qty 3

## 2018-08-22 MED ORDER — ALBUTEROL SULFATE (2.5 MG/3ML) 0.083% IN NEBU
5.0000 mg | INHALATION_SOLUTION | Freq: Once | RESPIRATORY_TRACT | Status: AC
  Administered 2018-08-22: 5 mg via RESPIRATORY_TRACT
  Filled 2018-08-22: qty 6

## 2018-08-22 NOTE — ED Notes (Signed)
ED Provider at bedside. 

## 2018-08-22 NOTE — ED Triage Notes (Signed)
Pt with c/o SOB and cough. He reports having to use his nebulizer at home multiple times. Wheezing noted in the upper fields. Pt is alert.

## 2018-08-22 NOTE — ED Notes (Signed)
Patient transported to X-ray 

## 2018-08-22 NOTE — Discharge Instructions (Signed)
Try to stop smoking.  Use your nebulizer or inhaler as needed for trouble breathing.  Call your primary care doctor for a follow-up appointment to be seen for a checkup next week, management of high blood pressure, and to get help with smoking cessation.

## 2018-08-22 NOTE — ED Provider Notes (Signed)
[place] EMERGENCY DEPARTMENT Provider Note   CSN: [id number] Arrival date & time: 08/22/18  1045     History   Chief Complaint Chief Complaint  Patient presents with  . Cough  . Shortness of Breath    HPI [name] is a 62 y.o. male.  HPI   Presents for evaluation of shortness of breath for several days associated with cough.  He is using his usual medications without relief.  He smokes cigarettes.  He has a history of "COPD."  He denies fever, chills, nausea, vomiting, weakness or dizziness.  There are no other no modifying factors.  Past Medical History:  Diagnosis Date  . Asthma   . COPD (chronic obstructive pulmonary disease) ([place])   . Hypertension   . Shortness of breath     Patient Active Problem List   Diagnosis Date Noted  . Primary hypertension   . Lymph node enlargement 12/10/2011  . Tobacco use disorder 08/04/2009  . Erectile dysfunction 12/31/2008  . COPD (chronic obstructive pulmonary disease) ([place]) 12/31/2008  . History of colonic polyps 01/18/2007    Past Surgical History:  Procedure Laterality Date  . COLONOSCOPY          Home Medications    Prior to Admission medications   Medication Sig Start Date End Date Taking? Authorizing Provider  acetaminophen (TYLENOL) 500 MG tablet Take 1,000 mg by mouth as needed for mild pain.   Yes [provider]  albuterol (PROVENTIL HFA;VENTOLIN HFA) 108 (90 Base) MCG/ACT inhaler Inhale 2-4 puffs into the lungs every 4 (four) hours as needed for wheezing (or cough). 06/20/18  Yes [place] Bing, DO  albuterol (PROVENTIL) (2.5 MG/3ML) 0.083% nebulizer solution USE 1 VIAL IN NEBULIZER 4 TIMES A DAY AS NEEDED FOR SHORTNESS OF BREATH Patient taking differently: Take 2.5 mg by nebulization every 2 (two) hours as needed for wheezing.  06/20/18  Yes [place] Bing, DO  amLODipine (NORVASC) 5 MG tablet Take 1 tablet (5 mg total) by mouth daily. 03/13/18  Yes [place] Bing, DO   aspirin 81 MG EC tablet Take 1 tablet (81 mg total) by mouth daily. 10/04/16  Yes Mayo, Pete Pelt, MD  dextromethorphan-guaiFENesin [facility] DM) 30-600 MG 12hr tablet Take 1 tablet by mouth as needed for cough (a needed for congestion).   Yes [provider]  tiotropium (SPIRIVA HANDIHALER) 18 MCG inhalation capsule Place 1 capsule (18 mcg total) into inhaler and inhale daily. 01/10/18  Yes [place] Bing, DO  amoxicillin-clavulanate (AUGMENTIN) 875-125 MG tablet Take 1 tablet by mouth 2 (two) times daily. One po bid x 7 days 08/22/18   [place] Bo, MD  azithromycin (ZITHROMAX Z-PAK) 250 MG tablet Take two tablets today, then one tablet every day until gone Patient not taking: Reported on 08/21/7509 2/58/52   Delora Fuel, MD  fexofenadine (ALLEGRA) 180 MG tablet Take 1 tablet (180 mg total) by mouth daily. Patient not taking: Reported on 05/20/2017 02/26/14   Frazier Richards, MD  fluticasone [facility]) 50 MCG/ACT nasal spray PLACE 2 SPRAYS INTO BOTH NOSTRILS DAILY. Patient not taking: Reported on 05/20/2017 10/04/16   Sela Hua, MD  Fluticasone-Salmeterol (ADVAIR DISKUS) 500-50 MCG/DOSE AEPB INHALE ONE PUFF INTO THE LUNGS TWICE A DAY Patient not taking: Reported on 03/23/2018 02/28/18   [place] Bing, DO  Fluticasone-Salmeterol (ADVAIR DISKUS) 500-50 MCG/DOSE AEPB INHALE ONE PUFF INTO THE LUNGS TWICE A DAY Patient not taking: Reported on 03/23/2018 03/01/18    Bing,  DO  predniSONE (DELTASONE) 20 MG tablet Take 1 tablet (20 mg total) by mouth 2 (two) times daily. 08/22/18   [place] Bo, MD  predniSONE (DELTASONE) 50 MG tablet Take 1 tablet (50 mg total) by mouth daily. Patient not taking: Reported on 71/0/6269 4/85/46   Delora Fuel, MD  traMADol (ULTRAM) 50 MG tablet Take 1 tablet (50 mg total) by mouth 2 (two) times daily as needed. Patient not taking: Reported on 03/23/2018 10/17/17   Petrucelli, Glynda Jaeger, PA-C    Family History Family History  Problem Relation Age  of Onset  . Cancer Father     Social History Social History   Tobacco Use  . Smoking status: Current Every Day Smoker    Packs/day: 0.50    Years: 30.00    Pack years: 15.00    Types: Cigarettes    Last attempt to quit: 08/21/2013    Years since quitting: 5.0  . Smokeless tobacco: Never Used  Substance Use Topics  . Alcohol use: Yes    Comment: rare/occasional  . Drug use: No     Allergies   Lisinopril   Review of Systems Review of Systems  All other systems reviewed and are negative.    Physical Exam Updated Vital Signs BP (!) 183/98   Pulse 77   Temp 97.8 F (36.6 C) (Oral)   Resp 14   SpO2 96%   Physical Exam  Constitutional: He is oriented to person, place, and time. He appears well-developed and well-nourished.  HENT:  Head: Normocephalic and atraumatic.  Right Ear: External ear normal.  Left Ear: External ear normal.  Eyes: Pupils are equal, round, and reactive to light. Conjunctivae and EOM are normal.  Neck: Normal range of motion and phonation normal. Neck supple.  Cardiovascular: Normal rate, regular rhythm and normal heart sounds.  Pulmonary/Chest: Effort normal. No respiratory distress. He has wheezes. He has no rales. He exhibits no tenderness and no bony tenderness.  Very poor air movement bilaterally.  No significant increased work of breathing.  Abdominal: Soft. There is no tenderness.  Musculoskeletal: Normal range of motion. He exhibits no edema, tenderness or deformity.  Neurological: He is alert and oriented to person, place, and time. No cranial nerve deficit or sensory deficit. He exhibits normal muscle tone. Coordination normal.  Skin: Skin is warm, dry and intact.  Psychiatric: He has a normal mood and affect. His behavior is normal. Judgment and thought content normal.  Nursing note and vitals reviewed.    ED Treatments / Results  Labs (all labs ordered are listed, but only abnormal results are displayed) Labs Reviewed  BASIC  METABOLIC PANEL - Abnormal; Notable for the following components:      Result Value   CO2 21 (*)    Creatinine, Ser 1.52 (*)    GFR calc non Af Amer 47 (*)    GFR calc Af Amer 55 (*)    All other components within normal limits  CBC    EKG None    Date: 08/22/2018  Rate: 86  Rhythm: normal sinus rhythm  QRS Axis: right  PR and QT Intervals: normal  ST/T Wave abnormalities: normal  PR and QRS Conduction Disutrbances:none  Narrative Interpretation:   Old EKG Reviewed: unchanged    Radiology Dg Chest 2 View  Result Date: 08/22/2018 CLINICAL DATA:  Cough and shortness of breath EXAM: CHEST - 2 VIEW COMPARISON:  July 18, 2018 FINDINGS: Lungs are hyperexpanded with mild scarring in the right mid lung. There  is no edema or consolidation. Heart size and pulmonary vascularity are normal. No adenopathy. No bone lesions. IMPRESSION: Lungs hyperexpanded with mild right mid lung scarring. No edema or consolidation. Heart size normal and stable. Electronically Signed   By: Lowella Grip III M.D.   On: 08/22/2018 12:40    Procedures .Critical Care Performed by: [place] Bo, MD Authorized by: Daleen Bo, MD   Critical care provider statement:    Critical care time (minutes):  35   Critical care start time:  08/22/2018 12:30 PM   Critical care end time:  08/22/2018 3:49 PM   Critical care time was exclusive of:  Separately billable procedures and treating other patients   Critical care was necessary to treat or prevent imminent or life-threatening deterioration of the following conditions:  Respiratory failure   Critical care was time spent personally by me on the following activities:  Blood draw for specimens, development of treatment plan with patient or surrogate, discussions with consultants, evaluation of patient's response to treatment, examination of patient, obtaining history from patient or surrogate, ordering and performing treatments and interventions, ordering and  review of laboratory studies, pulse oximetry, re-evaluation of patient's condition, review of old charts and ordering and review of radiographic studies   (including critical care time)  Medications Ordered in ED Medications  albuterol (PROVENTIL HFA;VENTOLIN HFA) 108 (90 Base) MCG/ACT inhaler 2 puff (has no administration in time range)  AEROCHAMBER PLUS FLO-VU MEDIUM MISC 1 each (has no administration in time range)  albuterol (PROVENTIL) (2.5 MG/3ML) 0.083% nebulizer solution 5 mg (5 mg Nebulization Given 08/22/18 1111)  albuterol (PROVENTIL,VENTOLIN) solution continuous neb (15 mg/hr Nebulization Given 08/22/18 1258)  predniSONE (DELTASONE) tablet 60 mg (60 mg Oral Given 08/22/18 1321)  amoxicillin-clavulanate (AUGMENTIN) 875-125 MG per tablet 1 tablet (1 tablet Oral Given 08/22/18 1321)     Initial Impression / Assessment and Plan / ED Course  I have reviewed the triage vital signs and the nursing notes.  Pertinent labs & imaging results that were available during my care of the patient were reviewed by me and considered in my medical decision making (see chart for details).     Patient Vitals for the past 24 hrs:  BP Temp Temp src Pulse Resp SpO2  08/22/18 1415 (!) 183/98 - - 77 14 96 %  08/22/18 1330 (!) 173/99 - - 71 14 100 %  08/22/18 1315 (!) 170/98 - - 71 (!) 21 100 %  08/22/18 1258 - - - - - 96 %  08/22/18 1245 (!) 150/99 - - 77 18 94 %  08/22/18 1215 (!) 160/104 - - 79 15 97 %  08/22/18 1204 (!) 191/108 - - 81 (!) 22 95 %  08/22/18 1054 (!) 149/108 97.8 F (36.6 C) Oral 92 20 100 %    3:47 PM Reevaluation with update and discussion. After initial assessment and treatment, an updated evaluation reveals he reports chest tightness after nebulizer has improved.  At this time lungs have improved air movement with decreased wheezing.  Findings discussed and questions answered. Daleen Bo   Medical Decision Making: C OPD exacerbation with tobacco abuse.  Doubt ACS,  pneumonia, CHF or sepsis.  Mild high blood pressure, without signs of endorgan damage.  Doubt hypertensive urgency.  CRITICAL CARE-yes Performed by: Daleen Bo  Nursing Notes Reviewed/ Care Coordinated Applicable Imaging Reviewed Interpretation of Laboratory Data incorporated into ED treatment  The patient appears reasonably screened and/or stabilized for discharge and I doubt any other medical condition or  other [place] requiring further screening, evaluation, or treatment in the ED at this time prior to discharge.  Plan: Home Medications-OTC analgesia, PRN; Home Treatments-stop smoking; return here if the recommended treatment, does not improve the symptoms; Recommended follow up-PCP follow-up 1 week for blood pressure check    Final Clinical Impressions(s) / ED Diagnoses   Final diagnoses:  COPD exacerbation ([place])  Hypertension, unspecified type  Tobacco abuse    ED Discharge Orders         Ordered    predniSONE (DELTASONE) 20 MG tablet  2 times daily     08/22/18 1553    amoxicillin-clavulanate (AUGMENTIN) 875-125 MG tablet  2 times daily     08/22/18 1553           [place]en Bo, MD 08/22/18 1555

## 2018-08-22 NOTE — ED Notes (Signed)
Pt back from x-ray.

## 2018-09-19 ENCOUNTER — Encounter (HOSPITAL_COMMUNITY): Payer: Self-pay | Admitting: Emergency Medicine

## 2018-09-19 ENCOUNTER — Ambulatory Visit (INDEPENDENT_AMBULATORY_CARE_PROVIDER_SITE_OTHER): Payer: Medicare HMO | Admitting: Family Medicine

## 2018-09-19 ENCOUNTER — Emergency Department (HOSPITAL_COMMUNITY)
Admission: EM | Admit: 2018-09-19 | Discharge: 2018-09-19 | Disposition: A | Discharge status: Home / Self Care | Payer: Medicare HMO | Attending: Emergency Medicine | Admitting: Emergency Medicine

## 2018-09-19 ENCOUNTER — Other Ambulatory Visit: Payer: Self-pay

## 2018-09-19 VITALS — BP 140/64 | HR 72 | Temp 98.2°F | Ht 69.0 in | Wt 124.0 lb

## 2018-09-19 DIAGNOSIS — K0889 Other specified disorders of teeth and supporting structures: Secondary | ICD-10-CM | POA: Insufficient documentation

## 2018-09-19 DIAGNOSIS — J449 Chronic obstructive pulmonary disease, unspecified: Secondary | ICD-10-CM | POA: Insufficient documentation

## 2018-09-19 DIAGNOSIS — I10 Essential (primary) hypertension: Secondary | ICD-10-CM | POA: Insufficient documentation

## 2018-09-19 DIAGNOSIS — Z7982 Long term (current) use of aspirin: Secondary | ICD-10-CM | POA: Diagnosis not present

## 2018-09-19 DIAGNOSIS — F17210 Nicotine dependence, cigarettes, uncomplicated: Secondary | ICD-10-CM | POA: Insufficient documentation

## 2018-09-19 DIAGNOSIS — Z79899 Other long term (current) drug therapy: Secondary | ICD-10-CM | POA: Diagnosis not present

## 2018-09-19 MED ORDER — NAPROXEN 250 MG PO TABS
500.0000 mg | ORAL_TABLET | Freq: Once | ORAL | Status: AC
  Administered 2018-09-19: 500 mg via ORAL
  Filled 2018-09-19: qty 2

## 2018-09-19 MED ORDER — NAPROXEN 500 MG PO TABS: 500.0000 mg | ORAL_TABLET | Freq: Two times a day (BID) | ORAL | 0 refills | Status: DC

## 2018-09-19 MED ORDER — PENICILLIN V POTASSIUM 250 MG PO TABS
500.0000 mg | ORAL_TABLET | Freq: Once | ORAL | Status: AC
  Administered 2018-09-19: 500 mg via ORAL
  Filled 2018-09-19: qty 2

## 2018-09-19 MED ORDER — PENICILLIN V POTASSIUM 500 MG PO TABS: 500.0000 mg | ORAL_TABLET | Freq: Four times a day (QID) | ORAL | 0 refills | Status: AC

## 2018-09-19 MED ORDER — ACETAMINOPHEN 500 MG PO TABS
1000.0000 mg | ORAL_TABLET | Freq: Once | ORAL | Status: AC
  Administered 2018-09-19: 1000 mg via ORAL
  Filled 2018-09-19: qty 2

## 2018-09-19 NOTE — ED Provider Notes (Signed)
Patient placed in Quick Look pathway, seen and evaluated   Chief Complaint: broken tooth  HPI:   Pt complains of pain in his mouth from a broken tooth Pt request dental referral   ROS:no fever, no chills  Physical Exam:   Gen: No distress  Neuro: Awake and Alert  Skin: Warm    Focused Exam: dental decay   Initiation of care has begun. The patient has been counseled on the process, plan, and necessity for staying for the completion/evaluation, and the remainder of the medical screening examination   Fransico Meadow, PA-C 09/19/18 [place], [place], DO 09/20/18 0025

## 2018-09-19 NOTE — ED Provider Notes (Signed)
[facility] EMERGENCY DEPARTMENT Provider Note   CSN: [id number] Arrival date & time: 09/19/18  1636     History   Chief Complaint Chief Complaint  Patient presents with  . Dental Pain    HPI [name] is a 62 y.o. male here for evaluation of dental pain sudden onset, persistent, severe on Sunday. He cracked a tooth when he was eating a candy bar.  He has taken 500 mg tylenol and 200 mg ibuprofen without relief. He is requesting something stronger for pain.  States he has been to the ER before and he has been prescribed pain medications. Pain is constant, throbbing radiating to entire left face and headache. He does not have a dentist but does want to see one and wants referral. No associated fevers, chills, facial swelling, gum bleeding or pus, trismus.   HPI  Past Medical History:  Diagnosis Date  . Asthma   . COPD (chronic obstructive pulmonary disease) ([place])   . Hypertension   . Shortness of breath     Patient Active Problem List   Diagnosis Date Noted  . Tooth pain 09/19/2018  . Primary hypertension   . Lymph node enlargement 12/10/2011  . Tobacco use disorder 08/04/2009  . Erectile dysfunction 12/31/2008  . COPD (chronic obstructive pulmonary disease) ([place]) 12/31/2008  . History of colonic polyps 01/18/2007    Past Surgical History:  Procedure Laterality Date  . COLONOSCOPY          Home Medications    Prior to Admission medications   Medication Sig Start Date End Date Taking? Authorizing Provider  acetaminophen (TYLENOL) 500 MG tablet Take 1,000 mg by mouth as needed for mild pain.    [provider]  albuterol (PROVENTIL) (2.5 MG/3ML) 0.083% nebulizer solution USE 1 VIAL IN NEBULIZER 4 TIMES A DAY AS NEEDED FOR SHORTNESS OF BREATH Patient taking differently: Take 2.5 mg by nebulization every 2 (two) hours as needed for wheezing.  06/20/18   Fenton Bing, DO  amLODipine (NORVASC) 5 MG tablet Take 1 tablet (5 mg total)  by mouth daily. 03/13/18   Speculator Bing, DO  aspirin 81 MG EC tablet Take 1 tablet (81 mg total) by mouth daily. 10/04/16   Mayo, Pete Pelt, MD  fexofenadine (ALLEGRA) 180 MG tablet Take 1 tablet (180 mg total) by mouth daily. 02/26/14   Frazier Richards, MD  fluticasone (FLONASE) 50 MCG/ACT nasal spray PLACE 2 SPRAYS INTO BOTH NOSTRILS DAILY. Patient not taking: Reported on 05/20/2017 10/04/16   Sela Hua, MD  Fluticasone-Salmeterol (ADVAIR DISKUS) 500-50 MCG/DOSE AEPB INHALE ONE PUFF INTO THE LUNGS TWICE A DAY 02/28/18   Moro Bing, DO  naproxen (NAPROSYN) 500 MG tablet Take 1 tablet (500 mg total) by mouth 2 (two) times daily. 09/19/18   Kinnie Feil, PA-C  penicillin v potassium (VEETID) 500 MG tablet Take 1 tablet (500 mg total) by mouth 4 (four) times daily for 7 days. 09/19/18 09/26/18  Kinnie Feil, PA-C  tiotropium (SPIRIVA HANDIHALER) 18 MCG inhalation capsule Place 1 capsule (18 mcg total) into inhaler and inhale daily. 01/10/18   [place] Bing, DO    Family History Family History  Problem Relation Age of Onset  . Cancer Father     Social History Social History   Tobacco Use  . Smoking status: Current Every Day Smoker    Packs/day: 0.50    Years: 30.00    Pack years: 15.00    Types:  Cigarettes    Last attempt to quit: 08/21/2013    Years since quitting: 5.0  . Smokeless tobacco: Never Used  Substance Use Topics  . Alcohol use: Yes    Comment: rare/occasional  . Drug use: No     Allergies   Lisinopril   Review of Systems Review of Systems  HENT: Positive for dental problem.   All other systems reviewed and are negative.    Physical Exam Updated Vital Signs BP 131/90 (BP Location: Right Arm)   Pulse 87   Temp 98.5 F (36.9 C) (Oral)   SpO2 100%   Physical Exam  Constitutional: He is oriented to person, place, and time. He appears well-developed and well-nourished.  Non-toxic appearance.  HENT:  Head: Normocephalic.  Right  Ear: External ear normal.  Left Ear: External ear normal.  Nose: Nose normal.  Multiple missing teeth, chronic dental decay throughout. Hard to estimate teeth numbering based on how many teeth are missing.  Approx left canine/premolar? Is cracked vertically on the front, pain around gumline locally and with tooth percussion but no surrounding gum line erythema, edema, fluctuance, bleeding or pus. No trismus. No facial edema.   Eyes: Conjunctivae and EOM are normal.  Neck: Full passive range of motion without pain.  Cardiovascular: Normal rate.  Pulmonary/Chest: Effort normal. No tachypnea. No respiratory distress.  Musculoskeletal: Normal range of motion.  Neurological: He is alert and oriented to person, place, and time.  Skin: Skin is warm and dry. Capillary refill takes less than 2 seconds.  Psychiatric: His behavior is normal. Thought content normal.     ED Treatments / Results  Labs (all labs ordered are listed, but only abnormal results are displayed) Labs Reviewed - No data to display  EKG None  Radiology No results found.  Procedures Procedures (including critical care time)  Medications Ordered in ED Medications  penicillin v potassium (VEETID) tablet 500 mg (500 mg Oral Given 09/19/18 1850)  naproxen (NAPROSYN) tablet 500 mg (500 mg Oral Given 09/19/18 1850)  acetaminophen (TYLENOL) tablet 1,000 mg (1,000 mg Oral Given 09/19/18 1849)     Initial Impression / Assessment and Plan / ED Course  I have reviewed the triage vital signs and the nursing notes.  Pertinent labs & imaging results that were available during my care of the patient were reviewed by me and considered in my medical decision making (see chart for details).     Dental pain secondary to chronic poor dental health, acute tooth fx likely leading to nerve sensitivity.  No dental abscess on exam with patient afebrile, non toxic appearing, swallowing secretions well without hot potato voice. Exam  unconcerning for Ludwig's angina or other deep tissue infection in neck/face/maxilla.  Although no acute signs of infection he is considered high risk more predispose to infect.  Will tx with penicillin, naproxen, 1g tylenol, oragel.  No indication for opioid strength pain medication today.  He has been seen for dental pain in the past.  Discussed opioid risks > benefit, pt was frustrated but ultimately understood.Eliott Nine patient to follow-up with dentist.  Patient given dentist contact info, encouraged to follow up in 1-2 days for ultimate management of dental pain and overall dental health. Strict ED return precautions given. Pt is aware of red flag symptoms that would warrant return to ED for re-evaluation and further treatment. Patient voices understanding and is agreeable to plan.  Final Clinical Impressions(s) / ED Diagnoses   Final diagnoses:  Pain, dental  ED Discharge Orders         Ordered    penicillin v potassium (VEETID) 500 MG tablet  4 times daily     09/19/18 1910    naproxen (NAPROSYN) 500 MG tablet  2 times daily     09/19/18 1910           Arlean Hopping 09/19/18 1931    Tegeler, Gwenyth Allegra, MD 09/20/18 [phone]

## 2018-09-19 NOTE — Discharge Instructions (Addendum)
You were seen in the ER for dental pain.  Pain is likely from broken tooth/exposed nerve endings from cavities.  There are no signs of superimposed infection or collection of pus however given risk we will treat with antibiotic to prevent possible early infection.   Take 1000 mg of acetaminophen (Tylenol) every 6 hours.  For more pain control take naproxen 500 mg every 12 hours.  Use Orajel 15 to 20 minutes prior to eating or drinking to desensitize nerve endings.  You can use desensitizing toothpastes (Sensodyne, Pronamel, Colgate sensitive) throughout the day to help with nerve pain.  Unfortunately, dental pain will continue until you have a full dental evaluation and treatment.  See dental resources attached.  Additionally, I have given you Dr. Radford Pax and Dr. Mariel Sleet contact information, they frequently try to accommodate patients from the Er.  Return to the ER for fevers, facial or gum line swelling, redness, pus.

## 2018-09-19 NOTE — ED Notes (Signed)
Toothache for 3-4 days from a broken tooth

## 2018-09-19 NOTE — Assessment & Plan Note (Addendum)
Multiple missing teeth with extensive cavities. Referral placed for dentistry. No evidence for abscess, increased erythema or exquisitely tender to palpation with tongue depressor. Afebrile without concern for disseminated infection. Advised tylenol and ibuprofen for pain. Patient became extremely agitated, requesting stronger pain medication and stormed out of the exam room stating he would go to the ED.

## 2018-09-19 NOTE — Progress Notes (Signed)
  Subjective:   Patient ID: [name]    DOB: 25-Jan-1956, 62 y.o. male   MRN: [id number]  [name] is a 62 y.o. male with a history of HTN, COPD, tobacco use here for   Tooth pain States he broke his tooth eating a Mr. Donell Sievert on Sunday. Has tried tylenol but isn't working anymore. Hasn't tried anything else. Hasn't seen dentist, last saw 3 years ago. Denies fevers, CP, SOB.  Review of Systems:  Per HPI.  [place], medications and smoking status reviewed.  Objective:   BP 140/64   Pulse 72   Temp 98.2 F (36.8 C) (Oral)   Ht 5\' 9"  (1.753 m)   Wt 124 lb (56.2 kg)   SpO2 96%   BMI 18.31 kg/m  Vitals and nursing note reviewed.  General: well nourished, well developed, in no acute distress with non-toxic appearance HEENT: normocephalic, atraumatic, moist mucous membranes. Poor dentition with multiple cavities and rotted teeth.  Skin: warm, dry, no rashes or lesions MSK: ROM grossly intact, strength intact, gait normal Neuro: Alert and oriented, speech normal  Assessment & Plan:   Tooth pain Multiple missing teeth with extensive cavities. Referral placed for dentistry. No evidence for abscess, increased erythema or exquisitely tender to palpation with tongue depressor. Afebrile without concern for disseminated infection. Advised tylenol and ibuprofen for pain. Patient became extremely agitated, requesting stronger pain medication and stormed out of the exam room stating he would go to the ED.  Orders Placed This Encounter  Procedures  . Ambulatory referral to Dentistry    Referral Priority:   Routine    Referral Type:   Consultation    Referral Reason:   Specialty Services Required    Requested Specialty:   Dental General Practice    Number of Visits Requested:   1   No orders of the defined types were placed in this encounter.  Rory Percy, DO PGY-2, [facility] 09/19/2018 4:38 PM

## 2018-09-19 NOTE — ED Notes (Signed)
Called x1, no response.

## 2018-09-19 NOTE — ED Triage Notes (Signed)
Pt. Stated, I broke my left upper tooth eating a candy bar. Tylenol is not working

## 2018-09-20 ENCOUNTER — Other Ambulatory Visit: Payer: Self-pay | Admitting: *Deleted

## 2018-09-20 NOTE — Patient Outreach (Signed)
[facility]) Care Management  09/20/2018  [name] 1956/11/10 [id number]   RN Health Coach attempted#1 screening outreach call to patient.  Patient was unavailable. HIPPA compliance voicemail message left with return callback number.  Plan: RN will call patient again within 10 business  days.  [place] Care Management [phone]

## 2018-09-21 NOTE — Patient Outreach (Signed)
[place] [facility]) Care Management  09/21/2018  [name] 19-Jun-1956 [id number]   RN Health Coach received return  telephone call from patient.  Hipaa compliance verified. Per patient he is having problems with transportation, affording Spiriva and Advair. He has uncontrolled hypertension. HX of Copd exacerbation. Patient needs a new nebulizer machine. Per patient it is 64 years old and it works sometimes. Per patient he has not had a physical, eye exam or dental in over 20 years. Patient has had multiple ED visits for dental pain, COPD exacerbation. Per patient his BP has been 200+/100+. He is unable to afford a BP cuff to check at home. Patient needs a list of food bank. Per patient he would like an advance directive/living will and agreed to social worker assistance. Patient has agreed to outreach from Tourist information centre manager, Magazine features editor. Use the home first number because sometimes his mobile is cut off.   Plan:  Referral to case manager Referral to pharmacy Referral to social worker  [place] Management [phone]

## 2018-09-24 ENCOUNTER — Other Ambulatory Visit: Payer: Self-pay

## 2018-09-24 ENCOUNTER — Telehealth: Payer: Self-pay | Admitting: Pharmacist

## 2018-09-24 NOTE — Patient Outreach (Signed)
[facility]) Care Management  [place]   09/24/2018  SHANT HENCE [birth year] [id number]  Reason for referral: Medication Assistance  Referral source: Telephonic Nurse Joaquim Lai [place]  Referral medication(s):Advair, Spiriva Current insurance:[employer]  Patient was called on the number listed as his home phone number and a recording stated "the person was not available".  The number listed as his home phone number was called. The patient answer that number and provided HIPAA identifiers.  PMHx: COPD, hypertension, tobacco use disorder and erectile disorder.  Patient said he is unable to afford his medications. He reported his copays usually range from $0-$8.50.     Objective: Allergies  Allergen Reactions  . Lisinopril Swelling    Medications Reviewed Today    Reviewed by Elayne Guerin, [facility] (Pharmacist) on 09/24/18 at 1429  Med List Status: <None>  Medication Order Taking? Sig Documenting Provider Last Dose Status Informant  acetaminophen (TYLENOL) 500 MG tablet [id number] Yes Take 1,000 mg by mouth as needed for mild pain. [provider] Taking Active Self  albuterol (PROVENTIL) (2.5 MG/3ML) 0.083% nebulizer solution [id number] Yes USE 1 VIAL IN NEBULIZER 4 TIMES A DAY AS NEEDED FOR SHORTNESS OF BREATH  Patient taking differently:  Take 2.5 mg by nebulization every 2 (two) hours as needed for wheezing.    Ashley Bing, DO Taking Active Self  amLODipine (NORVASC) 5 MG tablet [id number] Yes Take 1 tablet (5 mg total) by mouth daily. [place] Bing, DO Taking Active Self  aspirin 81 MG EC tablet [id number] Yes Take 1 tablet (81 mg total) by mouth daily. Mayo, Pete Pelt, MD Taking Active Self  fexofenadine [facility]) 180 MG tablet [id number] Yes Take 1 tablet (180 mg total) by mouth daily. Frazier Richards, MD Taking Active Self  fluticasone (FLONASE) 50 MCG/ACT nasal spray [id number] No PLACE 2 SPRAYS INTO BOTH NOSTRILS DAILY.  Patient not  taking:  Reported on 05/20/2017   MayoPete Pelt, MD Not Taking Active Self  Fluticasone-Salmeterol (ADVAIR DISKUS) 500-50 MCG/DOSE AEPB [id number] No INHALE ONE PUFF INTO THE LUNGS TWICE A DAY  Patient not taking:  Reported on 09/24/2018   [place] Bing, DO Not Taking Active Self  naproxen (NAPROSYN) 500 MG tablet [id number] No Take 1 tablet (500 mg total) by mouth 2 (two) times daily. Kinnie Feil, PA-C Unknown Active   penicillin v potassium (VEETID) 500 MG tablet [id number] Yes Take 1 tablet (500 mg total) by mouth 4 (four) times daily for 7 days. Kinnie Feil, PA-C Taking Active   tiotropium (SPIRIVA HANDIHALER) 18 MCG inhalation capsule [id number] No Place 1 capsule (18 mcg total) into inhaler and inhale daily.  Patient not taking:  Reported on 09/24/2018   [place] Bing, DO Not Taking Active Self           Med Note Nadara Eaton Aug 22, 2018 12:42 PM) Ran out          Assessment:  Drugs sorted by system:  Neurologic/Psychologic:  Cardiovascular: Aspirin, Amlodipine  Pulmonary/Allergy: Advair, Fluticasone Nasal Spray, Spiriva, Albuterol nebulizer solution, Fexofenadine  Pain: Naproxen, Acetaminophen Infectious Diseases:  Miscellaneous: Penicillin V  Medication Review Findings:  . Adherence-patient reported not using Advair and Spiriva due to cost.  He reported each inhaler cost him $8.50 for a 3 month supply.  This price means the patient may have extra help. An attempt was made to call [facility] but the patient said he did not have time to call [employer]. Marland Kitchen  Trelegy may be an option but it would still cost $8.50 for a 78-month supply (patient said he cannot afford this).    Medication Assistance Findings:  Extra Help:   [x]  Already receiving Full Extra Help  []  Already receiving Partial Extra Help  []  Eligible based on reported income and assets  []  Not Eligible based on reported income and assets  Additional medication assistance options  reviewed with patient as warranted:  No other options identified  Plan: Call patient back in 3-5 business days and attempt to call [employer].   Elayne Guerin, PharmD, [place] Clinical Pharmacist [phone]

## 2018-09-24 NOTE — Patient Outreach (Signed)
[facility]) Care Management  09/24/2018  [name] [birth year] [id number]  BSW attempted to outreach the patient on today's date. BSW dialed the patients "home number" [phone]). The patients wife answered the phone and stated this number was her cell and she was at work. The patients wife also indicated the patients cell is "out of minutes". BSW informed to try calling back prior to 2:00 pm.  Plan: BSW to attempt outreach later in the week prior to 2:00 pm.  Daneen Schick, [place], [place] Management Social Worker [phone]

## 2018-09-25 ENCOUNTER — Other Ambulatory Visit: Payer: Self-pay

## 2018-09-25 NOTE — Patient Outreach (Signed)
[facility]) Care Management  09/25/2018  [name] October 17, 1956 [id number]  Successful outreach to the patient on today's date, HIPAA identifiers confirmed. BSW introduced self to the patient and the reason for today's call, indicating the patient had been referred to this BSW for assistance with transportation and food resources. The patient stated "yeah they told me I might need that but I don't really know". The patient confirms his payor to be [facility].   BSW educated the patient on his transportation benefit provided by his payor through [employer]. The patient denies prior knowledge of this benefit. BSW discussed the benefit with the patient and explained how to access. BSW to send the patient information for future use. BSW discussed assisting the patient in applying for [place] during today's call. The patient declined this offer and stating "mail me something and I'll look at it".   BSW discussed food resources and offered to mail the patient "The [place]". BSW explained that this provided information pertaining to food banks in [place] with days of the week they are open. The patient is agreeable to receiving this mailing.  BSW attempted to discuss Medicaid with the patient during today's call. The patient reports he had Medicaid in the past during the two year wait to receive disability. BSW inquired if the patient lost his Medicaid due to income. The patient reports being unsure. The patient reports a monthly income of $866. The patient denies his spouse working stating "we are both retired". BSW inquired his wife's retirement income, the patient stated "I don't know".   BSW discussed the possibility of qualifying for Medicaid pending combined monthly household income. The patient states "I don't know what she makes and she would probably never tell me". BSW encouraged the patient to discuss income with his spouse and if they would like to apply for Medicaid.  As BSW was discussing application process the patient stated "okay well just send me the stuff."  Plan: BSW to outreach the patient in the next three weeks to confirm receipt of resources.  Daneen Schick, BSW, CDP [facility] [phone]

## 2018-09-26 ENCOUNTER — Other Ambulatory Visit: Payer: Self-pay | Admitting: *Deleted

## 2018-09-26 NOTE — Patient Outreach (Signed)
[facility] [facility]) Care Management  09/26/2018  [name] 03-Mar-1956 [id number]   Unsuccessful on the initial outreach call to pt today and unable to leave a voice message. Will send outreach letter and attempt to follow up with another outreach call next week.  Raina Mina, RN Care Management Coordinator [place] Office [phone]

## 2018-09-28 ENCOUNTER — Other Ambulatory Visit: Payer: Self-pay | Admitting: Pharmacist

## 2018-09-28 NOTE — Patient Outreach (Addendum)
[place] [facility]) Care Management  09/28/2018  [name] 1955/12/21 [id number]   Patient was called back to investigate getting refills from [facility]. HIPAA identifiers were obtained.  Patient has visited the ED >3 times in the past 3 months due to COPD exacerbation.  During a medication review earlier this month, the patient said he is not able to afford his inhalers (Spiriva and Advair). As such, he is only using albuterol via nebulizer or HFA inhaler.    [employer] was called with the patient on speaker phone. He has FULL LIS/Extra Help through [employer]. This means brand name drugs have a maximum copay of $8.50 for a 90 day supply.  The representative at [facility] said [place] have a $8.50 copay a piece.  Patient said this is still cost prohibitive for him as he has to purchase Albuterol.  If deemed therapeutically appropriate, the patient could be switched to  Trelegy with has LABA, LAMA, and ICS combination product.  Trelegy would cost the patient $8.50 for a 3 month supply from [facility] or the local pharmacy.  Also, if patient had albuterol filled for a 61-month supply, he can get it for $8.50 as well.  Because the patient has LIS, he is not eligible for patient assistance programs.  Plan: Route note to Dr. Yisroel Ramming to request a change to Trelegy.   Follow up in 3-5 days.   Elayne Guerin, PharmD, [place] Clinical Pharmacist [phone]

## 2018-10-01 ENCOUNTER — Other Ambulatory Visit: Payer: Self-pay | Admitting: *Deleted

## 2018-10-01 NOTE — Patient Outreach (Signed)
[place] [facility]) Care Management  10/01/2018  [name] January 30, 1956 [id number]   Telephone Assessment (2nd attempt)  RN attempted to reach pt on all available contacts. RN able to reach pt on his cell and introduced the [facility] program and available services. Pt verified identifiers and RN discussed pt's possible needs. Pt  Very adamant about visiting his providers when he want and feels he does not need to have inquires on assisting with this process. RN offered to assist in a effort to provide available resources at no charge for [facility] services only assistance as needed with his ongoing medical needs and issues related. Pt agreed to allow RN to inquire about available appointments at his local primary provider however with specific timeframe. RN contact Dr. Ericka Pontiff office and inquired on available and requested the first available appointment date and time for pt's options. Office indicated a Dec appointment however only in the afternoons and for morning appointments it would be in Feb. RN requested the office to call pt via cell and establish an available time to scheduled this appointment due to a dropped call RN had with pt on the phone and pt did not response when RN attempted to reach out once again. RN shared the pt's sell contact and office indicated they would contact pt and verify an office appointment. Call was ended.  RN received a call back from pt and RN provided available dates and times appointment could be established. RN informed pt that his provider's office would be contacting him to arrange an appointment. RN offered to follow up once this appointment has been established. Pt receptive and requested a call back next week. Will follow up next week on [facility] via plan of care based upon pt's specific needs.   Raina Mina, RN Care Management Coordinator [place] Office [phone]

## 2018-10-03 ENCOUNTER — Telehealth: Payer: Self-pay | Admitting: Pharmacist

## 2018-10-03 ENCOUNTER — Ambulatory Visit: Payer: Self-pay | Admitting: Pharmacist

## 2018-10-03 ENCOUNTER — Other Ambulatory Visit: Payer: Self-pay | Admitting: Family Medicine

## 2018-10-03 ENCOUNTER — Other Ambulatory Visit: Payer: Self-pay | Admitting: Pharmacist

## 2018-10-03 ENCOUNTER — Telehealth: Payer: Self-pay

## 2018-10-03 DIAGNOSIS — J449 Chronic obstructive pulmonary disease, unspecified: Secondary | ICD-10-CM

## 2018-10-03 MED ORDER — FLUTICASONE-UMECLIDIN-VILANT 100-62.5-25 MCG/INH IN AEPB: 1.0000 "application " | INHALATION_SPRAY | Freq: Every day | RESPIRATORY_TRACT | 2 refills | Status: DC

## 2018-10-03 NOTE — Patient Outreach (Signed)
[place] [facility]) Care Management  10/03/2018  [name] September 27, 1956 [id number]   [place] and left a message on the triage line on the patient's behalf in reference to getting a new prescription for Trelegy sent to [place] in the place of Advair and Spiriva due to medication affordability issues. [employer] confirmed the patient can get a 90-day supply of Trelegy for $8.50 delivered to him from their pharmacy.  Patient said he could afford the $8.50 copay quarterly. An inbasket message was sent to Dr. Yisroel Ramming on 09/28/18.  Plan: Await a call back. Follow up on 3-5 business days.   Elayne Guerin, PharmD, [place] Clinical Pharmacist [phone]

## 2018-10-03 NOTE — Telephone Encounter (Signed)
Noted. I just spoke with patient and ordered Trelegy. Replied to [employer] from [facility].  [place], [place], PGY-3

## 2018-10-03 NOTE — Patient Outreach (Signed)
[place] [facility]) Care Management  10/03/2018  [name] [birth year] [id number]   Called patient to follow up on him going to the pharmacy to pick up Trelegy as Dr. Yisroel Ramming [facility].  Unfortunately, patient did not answer his cell phone. Patient's wife answered the number listed as his home phone and said she would deliver the message to the patient to call me back.   Plan: Keep original call back with patient.   Elayne Guerin, PharmD, [place] Clinical Pharmacist [phone]

## 2018-10-03 NOTE — Telephone Encounter (Signed)
-----   Message from Laurel Bing, DO sent at 10/03/2018  9:58 AM EST ----- I called him to discuss transitioning from Advair and Spiriva to Trelegy.  Patient interested in trial of triple therapy inhaler.  He states that he has not been taking Advair or Spiriva for a few months due to cost.  Medication was sent in to his pharmacy on 11/13.  Thank you for your assistance.  Let me know if there are any updates regarding the inhaler transition.  Harriet Butte, [place], PGY-3  ----- Message ----- From: Elayne Guerin, [facility] Sent: 09/28/2018  10:28 AM EST To: Poc[place]ontas Bing, DO  Dr. Yisroel Ramming,  Please see the attached [facility] Pharmacist's note. Patient was referred to [place] for help with medication affordability with Advair and Spiriva. The patient has Full Extra Help through [employer] and because of this does not qualify for patient assistance programs. Even though he has Extra Help, he says the $8.50 copay per prescription is cost-prohibitive. If deemed therapeutically appropriate, prescribing Trelegy in the place of Advair and Spiriva may be prudent as it would allow once $8.50 copay per 33-months. Please let me know your thoughts.  Thank you for your time and consideration.  Blessings,  Elayne Guerin, PharmD, [place] Clinical Pharmacist [phone]

## 2018-10-03 NOTE — Telephone Encounter (Signed)
Denyse Amass, clinical pharmacist with [facility], called on behalf of patient. Please see her Epic notes from 09/28/18 and today regarding changing Spiriva and Advair to Trellegy and sending to [facility].  Alwyn Ren call back is [phone]  Danley Danker, RN [facility] RN)

## 2018-10-05 ENCOUNTER — Other Ambulatory Visit: Payer: Self-pay | Admitting: Pharmacist

## 2018-10-05 ENCOUNTER — Ambulatory Visit: Payer: Self-pay | Admitting: Pharmacist

## 2018-10-05 NOTE — Patient Outreach (Addendum)
[employer] [facility]) Care Management  10/05/2018  [name] 05-27-1956 [id number]   Patient was called to follow up on picking up Trelegy. Unfortunately, he did not answer either number listed in his chart. HIPAA compliant messages were left.  Plan: Call patient back in 10-14  business days.  [name], PharmD, [place] Clinical Pharmacist [phone]  ADDENDUM  Patient called back. HIPAA identifiers were obtained.  Patient said his check will come in on 10/10/18 and he will go and pick up the Trelegy at that time.  Plan: Follow up with the patient after 10/10/18 to be sure he picked up his medication.   [name], PharmD, [place] Clinical Pharmacist [phone]

## 2018-10-08 ENCOUNTER — Other Ambulatory Visit: Payer: Self-pay | Admitting: *Deleted

## 2018-10-08 NOTE — Patient Outreach (Signed)
[place] [facility]) Care Management  10/08/2018  [name] 06/22/1956 [id number]    Unsuccessful outreach call to pt today on both the home and cell. RN able to leave a HIPAA approved voice message requesting a call back. Will communicate with the team if they are having any success on outreach calls and request team to reiterated community case coordinator services and the unsuccessful outreach attempts to reach pt. If no response will close this case accordingly per policy.   Raina Mina, RN Care Management Coordinator [place] Office [phone]

## 2018-10-09 ENCOUNTER — Other Ambulatory Visit: Payer: Self-pay | Admitting: *Deleted

## 2018-10-10 NOTE — Patient Outreach (Addendum)
[place] [facility]) Care Management  10/09/2018  RAYLAN HANTON 03-Nov-1956 [id number]    Several unsuccessful outreach calls and no response from outreach letter mailed to pt. Note the one successful call was to assist pt in arranging a provider's appointment for the next month.  RN has been unsuccessful in reconnecting with pt following that call. Provider's office indicated they would attempt to contact the pt for an appointment. Case will be closed via this discipline and communication to other team involved along with update to the provider.  Raina Mina, RN Care Management Coordinator [place] Office [phone]

## 2018-10-13 ENCOUNTER — Emergency Department (HOSPITAL_COMMUNITY)
Admission: EM | Admit: 2018-10-13 | Discharge: 2018-10-13 | Disposition: A | Discharge status: Home / Self Care | Payer: Medicare HMO | Attending: Emergency Medicine | Admitting: Emergency Medicine

## 2018-10-13 ENCOUNTER — Emergency Department (HOSPITAL_COMMUNITY): Payer: Medicare HMO

## 2018-10-13 ENCOUNTER — Encounter (HOSPITAL_COMMUNITY): Payer: Self-pay

## 2018-10-13 DIAGNOSIS — R05 Cough: Secondary | ICD-10-CM | POA: Diagnosis not present

## 2018-10-13 DIAGNOSIS — F17210 Nicotine dependence, cigarettes, uncomplicated: Secondary | ICD-10-CM | POA: Diagnosis not present

## 2018-10-13 DIAGNOSIS — Z7982 Long term (current) use of aspirin: Secondary | ICD-10-CM | POA: Diagnosis not present

## 2018-10-13 DIAGNOSIS — I10 Essential (primary) hypertension: Secondary | ICD-10-CM | POA: Insufficient documentation

## 2018-10-13 DIAGNOSIS — J441 Chronic obstructive pulmonary disease with (acute) exacerbation: Secondary | ICD-10-CM | POA: Insufficient documentation

## 2018-10-13 DIAGNOSIS — R509 Fever, unspecified: Secondary | ICD-10-CM | POA: Diagnosis not present

## 2018-10-13 DIAGNOSIS — R0602 Shortness of breath: Secondary | ICD-10-CM | POA: Diagnosis not present

## 2018-10-13 DIAGNOSIS — Z79899 Other long term (current) drug therapy: Secondary | ICD-10-CM | POA: Diagnosis not present

## 2018-10-13 DIAGNOSIS — R062 Wheezing: Secondary | ICD-10-CM | POA: Diagnosis not present

## 2018-10-13 MED ORDER — PREDNISONE 10 MG PO TABS: 40.0000 mg | ORAL_TABLET | Freq: Every day | ORAL | 0 refills | Status: DC

## 2018-10-13 MED ORDER — ALBUTEROL SULFATE (2.5 MG/3ML) 0.083% IN NEBU
5.0000 mg | INHALATION_SOLUTION | Freq: Once | RESPIRATORY_TRACT | Status: AC
  Administered 2018-10-13: 5 mg via RESPIRATORY_TRACT
  Filled 2018-10-13: qty 6

## 2018-10-13 NOTE — ED Notes (Signed)
Patient verbalizes understanding of discharge instructions. Opportunity for questioning and answers were provided. Pt discharged from ED. 

## 2018-10-13 NOTE — ED Provider Notes (Signed)
[place] EMERGENCY DEPARTMENT Provider Note   CSN: [id number] Arrival date & time: 10/13/18  1033     History   Chief Complaint Chief Complaint  Patient presents with  . Shortness of Breath    HPI [name] is a 62 y.o. male.  Patient with known history of COPD.  Brought in by EMS.  Complaint of shortness of breath for about 5 days.  Has been using his home nebulizer treatments.  Patient is not on any steroids.  EMS noted inspiratory expiratory wheezing also nonproductive cough they stated fever patient denied a fever to me.  Patient given albuterol Atrovent nebulizer in route.  Patient with some improvement in the breathing upon arrival received second nebulizer here.  EMS also gave him IV Solu-Medrol.  No fever here.     Past Medical History:  Diagnosis Date  . Asthma   . COPD (chronic obstructive pulmonary disease) ([place])   . Hypertension   . Shortness of breath     Patient Active Problem List   Diagnosis Date Noted  . Tooth pain 09/19/2018  . Primary hypertension   . Lymph node enlargement 12/10/2011  . Tobacco use disorder 08/04/2009  . Erectile dysfunction 12/31/2008  . COPD (chronic obstructive pulmonary disease) ([place]) 12/31/2008  . History of colonic polyps 01/18/2007    Past Surgical History:  Procedure Laterality Date  . COLONOSCOPY          Home Medications    Prior to Admission medications   Medication Sig Start Date End Date Taking? Authorizing Provider  acetaminophen (TYLENOL) 500 MG tablet Take 1,000 mg by mouth as needed for mild pain.    [provider]  albuterol (PROVENTIL) (2.5 MG/3ML) 0.083% nebulizer solution USE 1 VIAL IN NEBULIZER 4 TIMES A DAY AS NEEDED FOR SHORTNESS OF BREATH Patient taking differently: Take 2.5 mg by nebulization every 2 (two) hours as needed for wheezing.  06/20/18   [place] Bing, DO  amLODipine (NORVASC) 5 MG tablet Take 1 tablet (5 mg total) by mouth daily. 03/13/18    [place] Bing, DO  aspirin 81 MG EC tablet Take 1 tablet (81 mg total) by mouth daily. 10/04/16   Mayo, Pete Pelt, MD  fexofenadine (ALLEGRA) 180 MG tablet Take 1 tablet (180 mg total) by mouth daily. 02/26/14   Frazier Richards, MD  fluticasone (FLONASE) 50 MCG/ACT nasal spray PLACE 2 SPRAYS INTO BOTH NOSTRILS DAILY. Patient not taking: Reported on 05/20/2017 10/04/16   MayoPete Pelt, MD  Fluticasone-Umeclidin-Vilant (TRELEGY ELLIPTA) 100-62.5-25 MCG/INH AEPB Inhale 1 application into the lungs daily. 10/03/18   [place] Bing, DO  naproxen (NAPROSYN) 500 MG tablet Take 1 tablet (500 mg total) by mouth 2 (two) times daily. 09/19/18   Kinnie Feil, PA-C  predniSONE (DELTASONE) 10 MG tablet Take 4 tablets (40 mg total) by mouth daily. 10/13/18   Fredia Sorrow, MD    Family History Family History  Problem Relation Age of Onset  . Cancer Father     Social History Social History   Tobacco Use  . Smoking status: Current Some Day Smoker    Packs/day: 0.50    Years: 30.00    Pack years: 15.00    Types: Cigarettes    Last attempt to quit: 08/21/2013    Years since quitting: 5.1  . Smokeless tobacco: Never Used  Substance Use Topics  . Alcohol use: Yes    Comment: rare/occasional  . Drug use: No  Allergies   Lisinopril   Review of Systems Review of Systems  Constitutional: Positive for fever.  HENT: Negative for congestion.   Eyes: Negative for redness.  Respiratory: Positive for shortness of breath and wheezing.   Cardiovascular: Negative for chest pain.  Gastrointestinal: Negative for abdominal pain.  Genitourinary: Negative for dysuria.  Musculoskeletal: Negative for back pain.  Skin: Negative for rash.  Neurological: Negative for syncope.  Hematological: Does not bruise/bleed easily.  Psychiatric/Behavioral: Negative for confusion.     Physical Exam Updated Vital Signs BP 138/66   Pulse 76   Temp 97.9 F (36.6 C) (Oral)   Resp 18   Ht 1.753 m  (5\' 9" )   Wt 59 kg   SpO2 96%   BMI 19.20 kg/m   Physical Exam  Constitutional: He is oriented to person, place, and time. He appears well-developed and well-nourished. No distress.  HENT:  Head: Normocephalic and atraumatic.  Mouth/Throat: Oropharynx is clear and moist.  Eyes: Pupils are equal, round, and reactive to light. Conjunctivae and EOM are normal.  Neck: Neck supple.  Cardiovascular: Normal rate, regular rhythm and normal heart sounds.  Pulmonary/Chest: Effort normal. He has wheezes.  Abdominal: Soft. Bowel sounds are normal.  Musculoskeletal: Normal range of motion. He exhibits no edema.  Neurological: He is alert and oriented to person, place, and time. No cranial nerve deficit or sensory deficit. He exhibits normal muscle tone. Coordination normal.  Skin: Skin is warm.  Nursing note and vitals reviewed.    ED Treatments / Results  Labs (all labs ordered are listed, but only abnormal results are displayed) Labs Reviewed - No data to display  EKG EKG Interpretation  Date/Time:  Saturday October 13 2018 10:40:49 EST Ventricular Rate:  89 PR Interval:    QRS Duration: 101 QT Interval:  399 QTC Calculation: 486 R Axis:   86 Text Interpretation:  Sinus rhythm Borderline right axis deviation Probable left ventricular hypertrophy Borderline prolonged QT interval Confirmed by Fredia Sorrow [phone]) on 10/13/2018 10:56:16 AM   Radiology Dg Chest 2 View  Result Date: 10/13/2018 CLINICAL DATA:  Shortness of breath, wheezing, cough and fever. COPD exacerbation. EXAM: CHEST - 2 VIEW COMPARISON:  08/22/2018 FINDINGS: The heart size and mediastinal contours are within normal limits. Stable emphysematous lung disease with mild to moderate bilateral pulmonary hyperinflation. There is no evidence of pulmonary edema, consolidation, pneumothorax, nodule or pleural fluid. The visualized skeletal structures are unremarkable. IMPRESSION: Stable emphysema and bilateral  hyperinflation. Electronically Signed   By: Aletta Edouard M.D.   On: 10/13/2018 11:29    Procedures Procedures (including critical care time)  Medications Ordered in ED Medications  albuterol (PROVENTIL) (2.5 MG/3ML) 0.083% nebulizer solution 5 mg (5 mg Nebulization Given 10/13/18 1128)     Initial Impression / Assessment and Plan / ED Course  I have reviewed the triage vital signs and the nursing notes.  Pertinent labs & imaging results that were available during my care of the patient were reviewed by me and considered in my medical decision making (see chart for details).    Wheezing completely resolved after 2 or 3 nebulizers.  Patient also already received Solu-Medrol.  Patient with room air sats in the 90s no respiratory distress.  Patient stable discharge home.  We will put him on a course of prednisone for the next 5 days.  He has nebulizer treatments at home.  Chest x-ray negative for pneumonia.  Afebrile here.  Patient now appears very stable.   Final Clinical  Impressions(s) / ED Diagnoses   Final diagnoses:  COPD exacerbation [facility])    ED Discharge Orders         Ordered    predniSONE (DELTASONE) 10 MG tablet  Daily     10/13/18 1242           Fredia Sorrow, MD 10/13/18 1253

## 2018-10-13 NOTE — ED Notes (Signed)
Pt returned from xray

## 2018-10-13 NOTE — ED Notes (Signed)
Patient transported to X-ray 

## 2018-10-13 NOTE — Discharge Instructions (Addendum)
Continue use your nebulizer treatments at home every 6 hours.  Take the prednisone as directed for the next 5 days.  Return for any new or worse symptoms.

## 2018-10-13 NOTE — ED Triage Notes (Signed)
Pt from home via ems; copd exacerbation; progressively more sob over past 3-5 days; has been taking home neb tx, relieved for short amount of time; inspiratory and expiratory wheezing; also c/o non productive cough and fever for past week; pt has had 1 neb tx at home prior to ems arrival; given 10 mg albuterol, 1 mg atrovent, 125 mg solumedrol, 2 g magnesium PTA  180/110 P 90 RR 22 100% neb

## 2018-10-15 ENCOUNTER — Ambulatory Visit: Payer: Self-pay

## 2018-10-15 ENCOUNTER — Other Ambulatory Visit: Payer: Self-pay

## 2018-10-15 NOTE — Patient Outreach (Signed)
[place] [facility]) Care Management  10/15/2018  [name] Jul 05, 1956 [id number]  Unsuccessful outreach to the patient in efforts to confirm receipt of mailed resources. BSW left a HIPAA compliant voice message requesting a return call.  Plan: BSW to attempt to contact the patient within the next four business days pending a return call is not received.  Daneen Schick, BSW, CDP [facility] [phone]

## 2018-10-22 ENCOUNTER — Ambulatory Visit: Payer: Self-pay | Admitting: Pharmacist

## 2018-10-22 ENCOUNTER — Other Ambulatory Visit: Payer: Self-pay

## 2018-10-22 ENCOUNTER — Other Ambulatory Visit: Payer: Self-pay | Admitting: Pharmacist

## 2018-10-22 ENCOUNTER — Ambulatory Visit: Payer: Self-pay

## 2018-10-22 NOTE — Patient Outreach (Signed)
[place] [facility]) Care Management  10/22/2018  [name] 08-19-1956 [id number]  Second unsuccessful outreach to the patient on today's date in efforts to confirm receipt of mailed community resources. BSW left a HIPAA compliant voice message requesting a return call.  Plan: BSW to attempt a third and final outreach within the next four business days.  Daneen Schick, BSW, CDP [facility] [phone]

## 2018-10-22 NOTE — Patient Outreach (Addendum)
[facility] [facility]) Care Management  10/22/2018  [name] 12/07/1955 [id number]   Patient was called to follow up .  Unfortunately, he did not answer the phone on the number listed as the alternate number.  The mobile number listed for him is Today's call marks the third unsuccessful call.  [place] Nurse, Raina Mina sent the patient an unsuccessful outreach letter on 09/25/18 and has subsequently closed him due to lack of contact.  I had been in communication with the patient's provider to get him switched to Trelegy as it has three ingredients and only 1 copay. Unfortunately, the patient did not pick it up and went back to the ED for COPD exacerbation.  Review of fill history data reveals the patient had Spiriva and Wixela (fluticasone propionate and salmeterol inhalation powder, USP) filled on   Unfortunately, this will not solve the patient's issue as Grant Ruts has a $3.40 copay and Spiriva has a $8.50 copay  versus 1 $8.50 copay for Trelegy.  [facility]y is on file as it was never picked up. Wixela (generic Advair) and Spiriva were filled on 10/11/18 but have not been picked up.   Plan: Route note to patient's provider. Send patient an unsuccessful contact letter. Call patient back in 7-10 business days. Close patient's case if I do not hear back from him.  Elayne Guerin, PharmD, [place] Clinical Pharmacist [phone]

## 2018-10-25 ENCOUNTER — Other Ambulatory Visit: Payer: Self-pay

## 2018-10-25 NOTE — Patient Outreach (Signed)
[place] [facility]) Care Management  10/25/2018  [name] [birth year] [id number]  Successful outreach to the patient on today's date, HIPAA identifiers confirmed. The patient is able to confirm receipt of mailed resources. The patient denies questions regarding provided resources. The patient denies other community resource needs.   BSW to perform a discipline closure as no other social work needs have been identified by the patient at this time. BSW to update the patients primary physician as well as [facility] pharmacist Denyse Amass of discipline closure.  Daneen Schick, BSW, CDP [facility] [phone]

## 2018-10-26 ENCOUNTER — Other Ambulatory Visit: Payer: Self-pay | Admitting: *Deleted

## 2018-10-26 ENCOUNTER — Ambulatory Visit: Payer: Self-pay

## 2018-10-26 NOTE — Patient Outreach (Signed)
[place] [facility]) Care Management  10/26/2018  [name] 11-May-1956 [id number]    RN attempted another outreach to this pt however remains unsuccessful. RN was able to leave a HIPAA approved vice message requesting a call back. Will also communicate with the involved [facility] team on status of this pt.   Will close this case and notify the provider of pt's disposition with [facility] services.  Raina Mina, RN Care Management Coordinator [place] Office [phone]

## 2018-11-05 ENCOUNTER — Encounter: Payer: Self-pay | Admitting: Pharmacist

## 2018-11-05 ENCOUNTER — Other Ambulatory Visit: Payer: Self-pay | Admitting: Pharmacist

## 2018-11-05 NOTE — Patient Outreach (Addendum)
[facility]) Care Management  11/05/2018  [name] 06/27/1956 [id number]   Patient was called regarding medication assistance. HIPAA identifiers were obtained. Patient confirmed he has NOT picked up ANY inhalers as prescribed and promised. [facility]. Wixela and Spiriva were prepared for the patient at the beginning of the month.  Patient says he picked up Prednisone and an antibiotic. Patient said he and his wife are on the "outs" .He said he moved away from his home and does not have access to his medications. (Albuterol and nebulizer).  He also shared that he almost went to the ED last night because he has been wheezing.   Patient asked if I could call his wife about his medications.  I called and spoke with his wife as he requested and she agreed to put the patient's medications on the porch around 1:30pm.    Patient said he would have his brother go by and pick up the medications (Albuterol nebulizer solution etc) as he has a 50-B and cannot go within 50 feet of his wife.    Plan: Close the patient's case as he has not followed our care plan of going to [facility] due to cost. He is receiving full Extra Help and was sent resource paperwork by Social Work.  His case has been closed with Social Work and with [facility].  Send notes to patient and his provider.  Elayne Guerin, PharmD, [place] Clinical Pharmacist [phone]

## 2019-02-01 ENCOUNTER — Other Ambulatory Visit: Payer: Self-pay | Admitting: Family Medicine

## 2019-02-01 DIAGNOSIS — I10 Essential (primary) hypertension: Secondary | ICD-10-CM

## 2019-02-08 ENCOUNTER — Ambulatory Visit: Payer: Medicare HMO

## 2019-02-08 ENCOUNTER — Telehealth (INDEPENDENT_AMBULATORY_CARE_PROVIDER_SITE_OTHER): Payer: Medicare HMO | Admitting: Family Medicine

## 2019-02-08 DIAGNOSIS — J449 Chronic obstructive pulmonary disease, unspecified: Secondary | ICD-10-CM

## 2019-02-08 DIAGNOSIS — J441 Chronic obstructive pulmonary disease with (acute) exacerbation: Secondary | ICD-10-CM | POA: Diagnosis not present

## 2019-02-08 MED ORDER — PREDNISONE 50 MG PO TABS: 50.0000 mg | ORAL_TABLET | Freq: Every day | ORAL | 0 refills | Status: DC

## 2019-02-08 MED ORDER — DOXYCYCLINE HYCLATE 100 MG PO TABS: 100.0000 mg | ORAL_TABLET | Freq: Two times a day (BID) | ORAL | 0 refills | Status: DC

## 2019-02-08 NOTE — Assessment & Plan Note (Signed)
Hx suggests acute exacerbation. He will stay home for 14 days. We will treat with prednisone 50mg  x 5 days and doxy 100mg  BID x 5 days. If no improvement or worsening, he will go to ED.

## 2019-02-08 NOTE — Telephone Encounter (Signed)
[place] Telemedicine Visit  Patient consented to have visit conducted via telephone.  Encounter participants: Patient: [name]  Provider: Ralene Ok  Others (if applicable): none  Chief Complaint: SOB  HPI:  Patient calls in saying he has COPD. He has noticed some increased SOB and can hear himself breathing. He has decreased exercise tolerance in that he cannot walk to the mailbox or around the house without getting more SOB. After doing these, he has to go back and do a neb. He does try to walk every day, yesterday he didn't go out at all. He does speak in long sentences on the phone. Some cough, no increased sputum. He denies fever or chills. He feels more SOB over the last 7-8 days. He tried his proventil inhaler to help, but this hasn't made enough difference. Uses albuterol in his neb solution. He has been intubated twice in the past he says. He has been "staying on the nebulizer" and he knows this is when it is getting worse. He was told the last time he was in the ICU that he may not surive the intubation again. He thinks the trigger may be the seasons changing, no URI sxs at the onset. He feels like the pollen is worsening it. No sick contacts, lives with just his wife. Using inhaler or neb about every 1 hour. He does have transportation to get to pharmacy.  This feels like his "typical COPD thing." he still smokes, last was yesterday, states not daily, maybe 1 every 2-3 days. Doesn't wear O2, but he did several years ago.   ROS: +SOB, no CP, no abd pain, no dysuria, no rash.   Pertinent PMHx: COPD, HTN, tobacco use  Assessment/Plan:  COPD (chronic obstructive pulmonary disease) (HCC) Hx suggests acute exacerbation. He will stay home for 14 days. We will treat with prednisone 50mg  x 5 days and doxy 100mg  BID x 5 days. If no improvement or worsening, he will go to ED.    Meds ordered this encounter  Medications  . predniSONE (DELTASONE) 50 MG  tablet    Sig: Take 1 tablet (50 mg total) by mouth daily with breakfast.    Dispense:  5 tablet    Refill:  0  . doxycycline (VIBRA-TABS) 100 MG tablet    Sig: Take 1 tablet (100 mg total) by mouth 2 (two) times daily.    Dispense:  10 tablet    Refill:  0    Charged for this visit according to telehealth billing procedure.  Time spent on phone with patient: 12 minutes  Ralene Ok, MD

## 2019-03-06 ENCOUNTER — Other Ambulatory Visit: Payer: Self-pay | Admitting: Family Medicine

## 2019-03-06 ENCOUNTER — Telehealth: Payer: Self-pay | Admitting: Family Medicine

## 2019-03-06 NOTE — Telephone Encounter (Signed)
Pt called to get Advair, and Spiriva refilled, pt states that they expired. Please give pt a call back.

## 2019-03-09 ENCOUNTER — Emergency Department (HOSPITAL_COMMUNITY): Payer: Medicare HMO

## 2019-03-09 ENCOUNTER — Other Ambulatory Visit: Payer: Self-pay

## 2019-03-09 ENCOUNTER — Encounter (HOSPITAL_COMMUNITY): Payer: Self-pay | Admitting: Emergency Medicine

## 2019-03-09 ENCOUNTER — Inpatient Hospital Stay (HOSPITAL_COMMUNITY)
Admission: EM | Admit: 2019-03-09 | Discharge: 2019-03-11 | DRG: 208 | Disposition: A | Discharge status: Home / Self Care | Payer: Medicare HMO | Attending: Internal Medicine | Admitting: Internal Medicine

## 2019-03-09 DIAGNOSIS — R911 Solitary pulmonary nodule: Secondary | ICD-10-CM | POA: Diagnosis not present

## 2019-03-09 DIAGNOSIS — J9600 Acute respiratory failure, unspecified whether with hypoxia or hypercapnia: Secondary | ICD-10-CM | POA: Diagnosis not present

## 2019-03-09 DIAGNOSIS — J9601 Acute respiratory failure with hypoxia: Secondary | ICD-10-CM | POA: Diagnosis present

## 2019-03-09 DIAGNOSIS — F17210 Nicotine dependence, cigarettes, uncomplicated: Secondary | ICD-10-CM | POA: Diagnosis not present

## 2019-03-09 DIAGNOSIS — I129 Hypertensive chronic kidney disease with stage 1 through stage 4 chronic kidney disease, or unspecified chronic kidney disease: Secondary | ICD-10-CM | POA: Diagnosis present

## 2019-03-09 DIAGNOSIS — Z20828 Contact with and (suspected) exposure to other viral communicable diseases: Secondary | ICD-10-CM | POA: Diagnosis not present

## 2019-03-09 DIAGNOSIS — Z79899 Other long term (current) drug therapy: Secondary | ICD-10-CM | POA: Diagnosis not present

## 2019-03-09 DIAGNOSIS — I10 Essential (primary) hypertension: Secondary | ICD-10-CM | POA: Diagnosis not present

## 2019-03-09 DIAGNOSIS — F17200 Nicotine dependence, unspecified, uncomplicated: Secondary | ICD-10-CM | POA: Diagnosis present

## 2019-03-09 DIAGNOSIS — Z791 Long term (current) use of non-steroidal anti-inflammatories (NSAID): Secondary | ICD-10-CM

## 2019-03-09 DIAGNOSIS — Z7951 Long term (current) use of inhaled steroids: Secondary | ICD-10-CM

## 2019-03-09 DIAGNOSIS — J939 Pneumothorax, unspecified: Secondary | ICD-10-CM | POA: Diagnosis not present

## 2019-03-09 DIAGNOSIS — R0602 Shortness of breath: Secondary | ICD-10-CM | POA: Diagnosis not present

## 2019-03-09 DIAGNOSIS — J45901 Unspecified asthma with (acute) exacerbation: Secondary | ICD-10-CM | POA: Diagnosis not present

## 2019-03-09 DIAGNOSIS — N183 Chronic kidney disease, stage 3 (moderate): Secondary | ICD-10-CM | POA: Diagnosis present

## 2019-03-09 DIAGNOSIS — Z9114 Patient's other noncompliance with medication regimen: Secondary | ICD-10-CM

## 2019-03-09 DIAGNOSIS — Z888 Allergy status to other drugs, medicaments and biological substances status: Secondary | ICD-10-CM | POA: Diagnosis not present

## 2019-03-09 DIAGNOSIS — J441 Chronic obstructive pulmonary disease with (acute) exacerbation: Principal | ICD-10-CM | POA: Diagnosis present

## 2019-03-09 DIAGNOSIS — Z7982 Long term (current) use of aspirin: Secondary | ICD-10-CM | POA: Diagnosis not present

## 2019-03-09 DIAGNOSIS — J80 Acute respiratory distress syndrome: Secondary | ICD-10-CM | POA: Diagnosis not present

## 2019-03-09 DIAGNOSIS — J449 Chronic obstructive pulmonary disease, unspecified: Secondary | ICD-10-CM

## 2019-03-09 LAB — COMPREHENSIVE METABOLIC PANEL
ALT: 25 U/L (ref 0–44)
AST: 32 U/L (ref 15–41)
Albumin: 4.1 g/dL (ref 3.5–5.0)
Alkaline Phosphatase: 59 U/L (ref 38–126)
Anion gap: 11 (ref 5–15)
BUN: 18 mg/dL (ref 8–23)
CO2: 28 mmol/L (ref 22–32)
Calcium: 9.7 mg/dL (ref 8.9–10.3)
Chloride: 104 mmol/L (ref 98–111)
Creatinine, Ser: 1.22 mg/dL (ref 0.61–1.24)
GFR calc Af Amer: >60 mL/min (ref >60)
GFR calc non Af Amer: >60 mL/min (ref >60)
Glucose, Bld: 97 mg/dL (ref 70–99)
Potassium: 4.7 mmol/L (ref 3.5–5.1)
Sodium: 143 mmol/L (ref 135–145)
Total Bilirubin: 0.8 mg/dL (ref 0.3–1.2)
Total Protein: 7.1 g/dL (ref 6.5–8.1)

## 2019-03-09 LAB — POCT I-STAT 7, (LYTES, BLD GAS, ICA,H+H)
Acid-base deficit: 5 mmol/L — ABNORMAL HIGH (ref 0.0–2.0)
Bicarbonate: 22.3 mmol/L (ref 20.0–28.0)
Calcium, Ion: 1.24 mmol/L (ref 1.15–1.40)
HCT: 41 % (ref 39.0–52.0)
Hemoglobin: 13.9 g/dL (ref 13.0–17.0)
O2 Saturation: 95 %
Patient temperature: 98.6
Potassium: 4.3 mmol/L (ref 3.5–5.1)
Sodium: 138 mmol/L (ref 135–145)
TCO2: 24 mmol/L (ref 22–32)
pCO2 arterial: 48.3 mmHg — ABNORMAL HIGH (ref 32.0–48.0)
pH, Arterial: 7.273 — ABNORMAL LOW (ref 7.350–7.450)
pO2, Arterial: 85 mmHg (ref 83.0–108.0)

## 2019-03-09 LAB — RAPID URINE DRUG SCREEN, HOSP PERFORMED
Amphetamines: NOT DETECTED
Barbiturates: NOT DETECTED
Benzodiazepines: NOT DETECTED
Cocaine: POSITIVE — AB
Opiates: NOT DETECTED
Tetrahydrocannabinol: NOT DETECTED

## 2019-03-09 LAB — CREATININE, SERUM
Creatinine, Ser: 1.23 mg/dL (ref 0.61–1.24)
GFR calc Af Amer: >60 mL/min (ref >60)
GFR calc non Af Amer: >60 mL/min (ref >60)

## 2019-03-09 LAB — RESPIRATORY PANEL BY PCR

## 2019-03-09 LAB — CBC WITH DIFFERENTIAL/PLATELET
Abs Immature Granulocytes: 0.02 10*3/uL (ref 0.00–0.07)
Basophils Absolute: 0.1 10*3/uL (ref 0.0–0.1)
Basophils Relative: 1 %
Eosinophils Absolute: 0.8 10*3/uL — ABNORMAL HIGH (ref 0.0–0.5)
Eosinophils Relative: 8 %
HCT: 45.2 % (ref 39.0–52.0)
Hemoglobin: 14.5 g/dL (ref 13.0–17.0)
Immature Granulocytes: 0 %
Lymphocytes Relative: 43 %
Lymphs Abs: 4.5 10*3/uL — ABNORMAL HIGH (ref 0.7–4.0)
MCH: 32.9 pg (ref 26.0–34.0)
MCHC: 32.1 g/dL (ref 30.0–36.0)
MCV: 102.5 fL — ABNORMAL HIGH (ref 80.0–100.0)
Monocytes Absolute: 0.9 10*3/uL (ref 0.1–1.0)
Monocytes Relative: 9 %
Neutro Abs: 4.1 10*3/uL (ref 1.7–7.7)
Neutrophils Relative %: 39 %
Platelets: 247 10*3/uL (ref 150–400)
RBC: 4.41 MIL/uL (ref 4.22–5.81)
RDW: 12.2 % (ref 11.5–15.5)
WBC: 10.5 10*3/uL (ref 4.0–10.5)
nRBC: 0 % (ref 0.0–0.2)

## 2019-03-09 LAB — URINALYSIS, ROUTINE W REFLEX MICROSCOPIC
Bilirubin Urine: NEGATIVE
Glucose, UA: NEGATIVE mg/dL
Hgb urine dipstick: NEGATIVE
Ketones, ur: NEGATIVE mg/dL
Leukocytes,Ua: NEGATIVE
Nitrite: NEGATIVE
Protein, ur: NEGATIVE mg/dL
Specific Gravity, Urine: 1.012 (ref 1.005–1.030)
pH: 5 (ref 5.0–8.0)

## 2019-03-09 LAB — GLUCOSE, CAPILLARY
Glucose-Capillary: 148 mg/dL — ABNORMAL HIGH (ref 70–99)
Glucose-Capillary: 135 mg/dL — ABNORMAL HIGH (ref 70–99)
Glucose-Capillary: 131 mg/dL — ABNORMAL HIGH (ref 70–99)

## 2019-03-09 LAB — D-DIMER, QUANTITATIVE: D-Dimer, Quant: 0.44 ug/mL-FEU (ref 0.00–0.50)

## 2019-03-09 LAB — LACTATE DEHYDROGENASE: LDH: 175 U/L (ref 98–192)

## 2019-03-09 LAB — SARS CORONAVIRUS 2 BY RT PCR (HOSPITAL ORDER, PERFORMED IN ~~LOC~~ HOSPITAL LAB): SARS Coronavirus 2: NEGATIVE

## 2019-03-09 LAB — MRSA PCR SCREENING: MRSA by PCR: NEGATIVE

## 2019-03-09 LAB — PROCALCITONIN: Procalcitonin: 30.35 ng/mL

## 2019-03-09 LAB — C-REACTIVE PROTEIN: CRP: <0.8 mg/dL (ref <1.0)

## 2019-03-09 LAB — TRIGLYCERIDES: Triglycerides: 61 mg/dL (ref <150)

## 2019-03-09 LAB — FERRITIN: Ferritin: 96 ng/mL (ref 24–336)

## 2019-03-09 LAB — FIBRINOGEN: Fibrinogen: 357 mg/dL (ref 210–475)

## 2019-03-09 MED ORDER — ALBUTEROL SULFATE HFA 108 (90 BASE) MCG/ACT IN AERS
8.0000 | INHALATION_SPRAY | Freq: Once | RESPIRATORY_TRACT | Status: AC
  Administered 2019-03-09: 8 via RESPIRATORY_TRACT

## 2019-03-09 MED ORDER — MIDAZOLAM HCL 2 MG/2ML IJ SOLN
2.0000 mg | INTRAMUSCULAR | Status: DC | PRN
  Administered 2019-03-09 (×2): 2 mg via INTRAVENOUS
  Filled 2019-03-09 (×2): qty 2

## 2019-03-09 MED ORDER — PROPOFOL 1000 MG/100ML IV EMUL
INTRAVENOUS | Status: AC
  Administered 2019-03-09: 15 ug/kg/min via INTRAVENOUS
  Filled 2019-03-09: qty 100

## 2019-03-09 MED ORDER — METHYLPREDNISOLONE SODIUM SUCC 125 MG IJ SOLR
60.0000 mg | Freq: Four times a day (QID) | INTRAMUSCULAR | Status: DC
  Administered 2019-03-09 – 2019-03-11 (×7): 60 mg via INTRAVENOUS
  Filled 2019-03-09 (×7): qty 2

## 2019-03-09 MED ORDER — FENTANYL CITRATE (PF) 100 MCG/2ML IJ SOLN
INTRAMUSCULAR | Status: AC
  Filled 2019-03-09: qty 4

## 2019-03-09 MED ORDER — HYDRALAZINE HCL 20 MG/ML IJ SOLN
10.0000 mg | Freq: Once | INTRAMUSCULAR | Status: AC
  Administered 2019-03-09: 10 mg via INTRAVENOUS
  Filled 2019-03-09: qty 1

## 2019-03-09 MED ORDER — PRO-STAT SUGAR FREE PO LIQD
60.0000 mL | Freq: Every day | ORAL | Status: DC
  Administered 2019-03-10: 09:00:00 60 mL
  Filled 2019-03-09: qty 60

## 2019-03-09 MED ORDER — MAGNESIUM SULFATE 2 GM/50ML IV SOLN
2.0000 g | Freq: Once | INTRAVENOUS | Status: AC
  Administered 2019-03-09: 2 g via INTRAVENOUS
  Filled 2019-03-09: qty 50

## 2019-03-09 MED ORDER — SUCCINYLCHOLINE CHLORIDE 20 MG/ML IJ SOLN
INTRAMUSCULAR | Status: AC | PRN
  Administered 2019-03-09: 100 mg via INTRAVENOUS

## 2019-03-09 MED ORDER — ETOMIDATE 2 MG/ML IV SOLN
INTRAVENOUS | Status: AC | PRN
  Administered 2019-03-09: 20 mg via INTRAVENOUS

## 2019-03-09 MED ORDER — PROPOFOL 1000 MG/100ML IV EMUL
5.0000 ug/kg/min | INTRAVENOUS | Status: DC
  Administered 2019-03-09: 70 ug/kg/min via INTRAVENOUS
  Administered 2019-03-09 (×2): 80 ug/kg/min via INTRAVENOUS
  Administered 2019-03-09: 65 ug/kg/min via INTRAVENOUS
  Administered 2019-03-09: 35 ug/kg/min via INTRAVENOUS
  Administered 2019-03-10: 65 ug/kg/min via INTRAVENOUS
  Administered 2019-03-10: 30 ug/kg/min via INTRAVENOUS
  Filled 2019-03-09 (×6): qty 100

## 2019-03-09 MED ORDER — ENOXAPARIN SODIUM 40 MG/0.4ML ~~LOC~~ SOLN
40.0000 mg | Freq: Every day | SUBCUTANEOUS | Status: DC
  Administered 2019-03-09 – 2019-03-11 (×3): 40 mg via SUBCUTANEOUS
  Filled 2019-03-09 (×3): qty 0.4

## 2019-03-09 MED ORDER — CHLORHEXIDINE GLUCONATE 0.12% ORAL RINSE (MEDLINE KIT)
15.0000 mL | Freq: Two times a day (BID) | OROMUCOSAL | Status: DC
  Administered 2019-03-10: 15 mL via OROMUCOSAL

## 2019-03-09 MED ORDER — BUDESONIDE 0.5 MG/2ML IN SUSP
0.5000 mg | Freq: Two times a day (BID) | RESPIRATORY_TRACT | Status: DC
  Administered 2019-03-09 – 2019-03-11 (×5): 0.5 mg via RESPIRATORY_TRACT
  Filled 2019-03-09 (×4): qty 2

## 2019-03-09 MED ORDER — FREE WATER
100.0000 mL | Freq: Four times a day (QID) | Status: DC
  Administered 2019-03-09 – 2019-03-10 (×4): 100 mL

## 2019-03-09 MED ORDER — FENTANYL CITRATE (PF) 100 MCG/2ML IJ SOLN
50.0000 ug | INTRAMUSCULAR | Status: DC | PRN
  Administered 2019-03-10: 100 ug via INTRAVENOUS
  Filled 2019-03-09: qty 2

## 2019-03-09 MED ORDER — FENTANYL CITRATE (PF) 100 MCG/2ML IJ SOLN
INTRAMUSCULAR | Status: AC | PRN
  Administered 2019-03-09: 50 ug via INTRAVENOUS

## 2019-03-09 MED ORDER — ORAL CARE MOUTH RINSE
15.0000 mL | OROMUCOSAL | Status: DC
  Administered 2019-03-09 – 2019-03-10 (×7): 15 mL via OROMUCOSAL

## 2019-03-09 MED ORDER — IOHEXOL 300 MG/ML  SOLN
75.0000 mL | Freq: Once | INTRAMUSCULAR | Status: AC | PRN
  Administered 2019-03-09: 75 mL via INTRAVENOUS

## 2019-03-09 MED ORDER — PANTOPRAZOLE SODIUM 40 MG IV SOLR
40.0000 mg | INTRAVENOUS | Status: DC
  Administered 2019-03-09: 40 mg via INTRAVENOUS
  Filled 2019-03-09: qty 40

## 2019-03-09 MED ORDER — FENTANYL CITRATE (PF) 100 MCG/2ML IJ SOLN
50.0000 ug | INTRAMUSCULAR | Status: DC | PRN
  Administered 2019-03-09: 50 ug via INTRAVENOUS
  Filled 2019-03-09 (×2): qty 2

## 2019-03-09 MED ORDER — MIDAZOLAM HCL 2 MG/2ML IJ SOLN
2.0000 mg | INTRAMUSCULAR | Status: DC | PRN
  Administered 2019-03-09 – 2019-03-10 (×3): 2 mg via INTRAVENOUS
  Filled 2019-03-09 (×4): qty 2

## 2019-03-09 MED ORDER — VITAL HIGH PROTEIN PO LIQD
1000.0000 mL | ORAL | Status: DC
  Administered 2019-03-09: 1000 mL

## 2019-03-09 MED ORDER — AMLODIPINE BESYLATE 5 MG PO TABS
5.0000 mg | ORAL_TABLET | Freq: Every day | ORAL | Status: DC
  Administered 2019-03-09 – 2019-03-11 (×3): 5 mg via ORAL
  Filled 2019-03-09 (×3): qty 1

## 2019-03-09 MED ORDER — METHYLPREDNISOLONE SODIUM SUCC 125 MG IJ SOLR
60.0000 mg | Freq: Four times a day (QID) | INTRAMUSCULAR | Status: DC
  Administered 2019-03-09: 60 mg via INTRAVENOUS
  Filled 2019-03-09: qty 2

## 2019-03-09 MED ORDER — IPRATROPIUM-ALBUTEROL 0.5-2.5 (3) MG/3ML IN SOLN
RESPIRATORY_TRACT | Status: AC
  Administered 2019-03-09: 3 mL
  Filled 2019-03-09: qty 3

## 2019-03-09 MED ORDER — ALBUTEROL SULFATE HFA 108 (90 BASE) MCG/ACT IN AERS
INHALATION_SPRAY | RESPIRATORY_TRACT | Status: AC
  Administered 2019-03-09: 07:00:00 via RESPIRATORY_TRACT
  Filled 2019-03-09: qty 6.7

## 2019-03-09 MED ORDER — ALBUTEROL SULFATE (2.5 MG/3ML) 0.083% IN NEBU: 2.5000 mg | INHALATION_SOLUTION | RESPIRATORY_TRACT | Status: DC | PRN

## 2019-03-09 MED ORDER — FENTANYL CITRATE (PF) 100 MCG/2ML IJ SOLN
100.0000 ug | Freq: Once | INTRAMUSCULAR | Status: AC
  Administered 2019-03-09: 100 ug via INTRAVENOUS

## 2019-03-09 MED ORDER — IPRATROPIUM-ALBUTEROL 0.5-2.5 (3) MG/3ML IN SOLN
3.0000 mL | Freq: Four times a day (QID) | RESPIRATORY_TRACT | Status: DC
  Administered 2019-03-09 – 2019-03-11 (×7): 3 mL via RESPIRATORY_TRACT
  Filled 2019-03-09 (×7): qty 3

## 2019-03-09 MED ORDER — PROPOFOL 1000 MG/100ML IV EMUL
5.0000 ug/kg/min | INTRAVENOUS | Status: DC
  Administered 2019-03-09: 08:00:00 15 ug/kg/min via INTRAVENOUS

## 2019-03-09 MED ORDER — AEROCHAMBER PLUS FLO-VU LARGE MISC
1.0000 | Freq: Once | Status: AC
  Administered 2019-03-09: 1

## 2019-03-09 NOTE — ED Provider Notes (Signed)
[facility] EMERGENCY DEPARTMENT Provider Note   CSN: [id number] Arrival date & time: 03/09/19  2130    History   Chief Complaint Chief Complaint  Patient presents with   Shortness of Breath    HPI [name] is a 63 y.o. male.     HPI Patient with history of COPD presents with 3 days of worsening shortness of breath.  States he has had nonproductive cough.  Denies fever or chills.  Given course of doxycycline and prednisone last month for similar episode.  No known fever or chills.  No chest pain.  No new lower extremity swelling or pain.  No known coronavirus exposure or recent travel.  Shortness of breath worsened overnight.  Use nebulized albuterol x4 at home.  In route was given IM epinephrine and Solu-Medrol by EMS.  Has received several puffs of MDI inhaler in the emergency department and now has magnesium infusing. Past Medical History:  Diagnosis Date   Asthma    COPD (chronic obstructive pulmonary disease) ([place])    Hypertension    Shortness of breath     Patient Active Problem List   Diagnosis Date Noted   Tooth pain 09/19/2018   Primary hypertension    Lymph node enlargement 12/10/2011   Tobacco use disorder 08/04/2009   Erectile dysfunction 12/31/2008   COPD (chronic obstructive pulmonary disease) ([place]) 12/31/2008   History of colonic polyps 01/18/2007    Past Surgical History:  Procedure Laterality Date   COLONOSCOPY          Home Medications    Prior to Admission medications   Medication Sig Start Date End Date Taking? Authorizing Provider  acetaminophen (TYLENOL) 500 MG tablet Take 1,000 mg by mouth as needed for mild pain.   Yes [provider]  albuterol (PROVENTIL) (2.5 MG/3ML) 0.083% nebulizer solution USE 1 VIAL IN NEBULIZER 4 TIMES A DAY AS NEEDED FOR SHORTNESS OF BREATH Patient taking differently: Take 2.5 mg by nebulization every 2 (two) hours as needed for shortness of breath.  06/20/18  Yes  Lawler Bing, DO  amLODipine (NORVASC) 5 MG tablet TAKE 1 TABLET EVERY DAY Patient taking differently: Take 5 mg by mouth daily.  02/04/19  Yes Chelan Bing, DO  guaiFENesin (MUCUS RELIEF ADULT PO) Take 1 tablet by mouth as needed.   Yes [provider]  loratadine (ALLERGY RELIEF) 10 MG tablet Take 10 mg by mouth daily as needed for allergies.   Yes [provider]  aspirin 81 MG EC tablet Take 1 tablet (81 mg total) by mouth daily. Patient not taking: Reported on 03/09/2019 10/04/16   Mayo, Pete Pelt, MD  doxycycline (VIBRA-TABS) 100 MG tablet Take 1 tablet (100 mg total) by mouth 2 (two) times daily. Patient not taking: Reported on 03/09/2019 02/08/19   Sela Hilding, MD  fexofenadine (ALLEGRA) 180 MG tablet Take 1 tablet (180 mg total) by mouth daily. Patient not taking: Reported on 03/09/2019 02/26/14   Frazier Richards, MD  fluticasone [facility]) 50 MCG/ACT nasal spray PLACE 2 SPRAYS INTO BOTH NOSTRILS DAILY. Patient not taking: Reported on 03/09/2019 10/04/16   MayoPete Pelt, MD  Fluticasone-Umeclidin-Vilant (TRELEGY ELLIPTA) 100-62.5-25 MCG/INH AEPB Inhale 1 application into the lungs daily. Patient not taking: Reported on 11/05/2018 10/03/18   Sigourney Bing, DO  naproxen (NAPROSYN) 500 MG tablet Take 1 tablet (500 mg total) by mouth 2 (two) times daily. Patient not taking: Reported on 03/09/2019 09/19/18   Kinnie Feil, PA-C  predniSONE (DELTASONE) 50 MG tablet Take 1 tablet (50 mg total) by mouth daily with breakfast. Patient not taking: Reported on 03/09/2019 02/08/19   Sela Hilding, MD  SPIRIVA HANDIHALER 18 MCG inhalation capsule INHALE 1 CAPSULE VIA HANDIHALER ONCE DAILY AT [place] Patient taking differently: Place 18 mcg into inhaler and inhale daily at 12 noon.  03/06/19   Brule Bing, DO    Family History Family History  Problem Relation Age of Onset   Cancer Father     Social History Social History    Tobacco Use   Smoking status: Current Some Day Smoker    Packs/day: 0.50    Years: 30.00    Pack years: 15.00    Types: Cigarettes    Last attempt to quit: 08/21/2013    Years since quitting: 5.5   Smokeless tobacco: Never Used  Substance Use Topics   Alcohol use: Yes    Comment: rare/occasional   Drug use: No     Allergies   Lisinopril   Review of Systems Review of Systems  Constitutional: Negative for chills and fever.  HENT: Negative for sore throat and trouble swallowing.   Eyes: Negative for visual disturbance.  Respiratory: Positive for cough, shortness of breath and wheezing.   Cardiovascular: Negative for chest pain.  Gastrointestinal: Negative for abdominal pain, constipation, diarrhea, nausea and vomiting.  Musculoskeletal: Negative for back pain, myalgias and neck pain.  Skin: Negative for rash and wound.  Neurological: Negative for dizziness, weakness, light-headedness, numbness and headaches.  All other systems reviewed and are negative.    Physical Exam Updated Vital Signs BP 119/67    Pulse 91    Resp 19    Ht 5\' 9"  (1.753 m)    Wt 56.2 kg Comment: from Nov 2019 records   SpO2 94%    BMI 18.31 kg/m   Physical Exam Vitals signs and nursing note reviewed.  Constitutional:      Appearance: He is well-developed.  HENT:     Head: Normocephalic and atraumatic.  Eyes:     Pupils: Pupils are equal, round, and reactive to light.  Neck:     Musculoskeletal: Normal range of motion and neck supple. No neck rigidity or muscular tenderness.     Vascular: No carotid bruit.  Cardiovascular:     Rate and Rhythm: Regular rhythm. Tachycardia present.     Heart sounds: No murmur. No friction rub. No gallop.   Pulmonary:     Effort: Pulmonary effort is normal.     Breath sounds: Wheezing present.     Comments: Increased work of breathing.  Speaking in few word sentences.  Inspiratory and expiratory wheezing with decreased air movement throughout.  Use of  accessory muscles Abdominal:     General: Bowel sounds are normal.     Palpations: Abdomen is soft.     Tenderness: There is no abdominal tenderness. There is no guarding or rebound.  Musculoskeletal: Normal range of motion.        General: No swelling, tenderness, deformity or signs of injury.     Right lower leg: No edema.     Left lower leg: No edema.  Lymphadenopathy:     Cervical: No cervical adenopathy.  Skin:    General: Skin is warm and dry.     Findings: No erythema or rash.  Neurological:     General: No focal deficit present.     Mental Status: He is alert and oriented to person, place,  and time.  Psychiatric:        Behavior: Behavior normal.      ED Treatments / Results  Labs (all labs ordered are listed, but only abnormal results are displayed) Labs Reviewed  CBC WITH DIFFERENTIAL/PLATELET - Abnormal; Notable for the following components:      Result Value   MCV 102.5 (*)    Lymphs Abs 4.5 (*)    Eosinophils Absolute 0.8 (*)    All other components within normal limits  SARS CORONAVIRUS 2 (HOSPITAL ORDER, [place] [facility])  COMPREHENSIVE METABOLIC PANEL  D-DIMER, QUANTITATIVE (NOT AT [facility])  PROCALCITONIN  LACTATE DEHYDROGENASE  FERRITIN  TRIGLYCERIDES  FIBRINOGEN  C-REACTIVE PROTEIN    EKG EKG Interpretation  Date/Time:  Saturday March 09 2019 06:52:35 EDT Ventricular Rate:  96 PR Interval:    QRS Duration: 103 QT Interval:  356 QTC Calculation: 450 R Axis:   107 Text Interpretation:  Sinus rhythm Anterior infarct, old Minimal ST depression, inferior leads Confirmed by Ripley Fraise [phone]) on 03/09/2019 7:05:18 AM   Radiology Ct Chest W Contrast  Result Date: 03/09/2019 CLINICAL DATA:  Shortness of breath.  Pneumothorax suspected. EXAM: CT CHEST WITH CONTRAST TECHNIQUE: Multidetector CT imaging of the chest was performed during intravenous contrast administration. CONTRAST:  29mL OMNIPAQUE IOHEXOL 300 MG/ML  SOLN  COMPARISON:  March 09, 2019 chest x-ray FINDINGS: Cardiovascular: Mild atherosclerotic changes in the nonaneurysmal aorta without dissection. The heart size is normal. The central pulmonary arteries are unremarkable. Mediastinum/Nodes: The NG tube terminates below the left hemidiaphragm. No pneumothorax. No adenopathy. Lungs/Pleura: The ETT terminates in good position. The trachea and mainstem bronchi are normal. Mild narrowing of the right bronchus intermedius as seen on axial image 77 and sagittal image 68 with no endobronchial lesion identified. The remainder of the airways are normal within visualize limits. No pneumothorax. Evaluation of the bases is limited due to respiratory motion. There is a nodule in the left apex measuring 5 by 5 by 3 mm with a mean diameter of 4 mm. No other nodules. No masses or infiltrates. Mild emphysematous changes in the apices. Upper Abdomen: There is a cyst identified in the liver. Upper abdomen otherwise unremarkable. Musculoskeletal: No chest wall abnormality. No acute or significant osseous findings. IMPRESSION: 1. No pneumothorax. 2. Smooth mild narrowing of the right bronchus intermedius without identified mass or endobronchial lesion. No associated wall thickening. This is a nonspecific finding. Recommend attention on short-term follow-up when the patient's symptoms have stabilized and a study may be performed with less respiratory motion. 3. There is a 4 mm nodule in the left apex which is nonspecific. No follow-up needed if patient is low-risk. Non-contrast chest CT can be considered in 12 months if patient is high-risk. This recommendation follows the consensus statement: Guidelines for Management of Incidental Pulmonary Nodules Detected on CT Images: From the Fleischner Society 2017; Radiology 2017; 284:228-243. 4. Mild emphysematous changes in the lung apices. 5. Mild atherosclerotic changes in the nonaneurysmal aorta. Aortic Atherosclerosis (ICD10-I70.0).  Electronically Signed   By: Dorise Bullion III M.D   On: 03/09/2019 09:22   Dg Chest Portable 1 View  Result Date: 03/09/2019 CLINICAL DATA:  63 year old male with respiratory failure, now intubated EXAM: PORTABLE CHEST 1 VIEW COMPARISON:  Prior chest x-ray 03/09/2019 at 7:14 a.m. FINDINGS: Interval intubation. The tip of the endotracheal tube is in good position 6 cm above the carina. A gastric tube has also been placed. The tip of the tube is not visualized  but lies below the diaphragm, presumably within the stomach. No findings to suggest pneumothorax. No focal airspace opacity, pulmonary edema or pleural effusion. No acute osseous abnormality. IMPRESSION: 1. The tip of the newly placed endotracheal tube is 6 cm above the carina. 2. Gastric tube in place, the tip lies off the field of view, below the diaphragm and presumably within the stomach. 3. No pneumothorax identified. Previously noted linear opacity was likely artifactual. Electronically Signed   By: Jacqulynn Cadet M.D.   On: 03/09/2019 08:54   Dg Chest Port 1 View  Result Date: 03/09/2019 CLINICAL DATA:  Severe SOB this morning, even with albuterol treatment.sob EXAM: PORTABLE CHEST 1 VIEW COMPARISON:  10/13/2018 FINDINGS: Normal cardiac silhouette. Lungs are hyperinflated. Thin linear edge extending vertically in the lateral aspect of the RIGHT upper lobe terminates at the horizontal fissure. Concern for partial collapse of the RIGHT upper lobe. Potential pleura edge 23 mm from the chest wall. Otherwise lungs are clear. IMPRESSION: Concern for atypical RIGHT upper lobe pneumothorax with a vertical pleural edge versus externally artifact. Volume of potential pneumothorax is small (approximately 10% hemithorax volume). Findings conveyed to[place]ellvertonon 03/09/2019  at08:02. Electronically Signed   By: Suzy Bouchard M.D.   On: 03/09/2019 08:13    Procedures Procedure Name: Intubation Date/Time: 03/09/2019 9:08 AM Performed by: Julianne Rice, MD Pre-anesthesia Checklist: Patient identified, Patient being monitored, Emergency Drugs available and Suction available Oxygen Delivery Method: Non-rebreather mask Preoxygenation: Pre-oxygenation with 100% oxygen Induction Type: Rapid sequence Laryngoscope Size: 4 Tube size: 7.5 mm Number of attempts: 1 Placement Confirmation: ETT inserted through vocal cords under direct vision,  CO2 detector and Breath sounds checked- equal and bilateral Tube secured with: ETT holder      (including critical care time)  Medications Ordered in ED Medications  propofol (DIPRIVAN) 1000 MG/100ML infusion (60 mcg/kg/min  56.2 kg Intravenous Rate/Dose Change 03/09/19 0940)  fentaNYL (SUBLIMAZE) injection ( Intravenous Canceled Entry 03/09/19 0845)  albuterol (VENTOLIN HFA) 108 (90 Base) MCG/ACT inhaler 8 puff (8 puffs Inhalation Given 03/09/19 0705)  AeroChamber Plus Flo-Vu Large MISC 1 each (1 each Other Given 03/09/19 0704)  magnesium sulfate IVPB 2 g 50 mL (0 g Intravenous Stopped 03/09/19 0812)  hydrALAZINE (APRESOLINE) injection 10 mg (10 mg Intravenous Given 03/09/19 0734)  etomidate (AMIDATE) injection (20 mg Intravenous Given 03/09/19 0815)  succinylcholine (ANECTINE) injection (100 mg Intravenous Given 03/09/19 0817)  iohexol (OMNIPAQUE) 300 MG/ML solution 75 mL (75 mLs Intravenous Contrast Given 03/09/19 0901)  fentaNYL (SUBLIMAZE) injection 100 mcg (100 mcg Intravenous Given 03/09/19 9381)   CRITICAL CARE Performed by: Julianne Rice Total critical care time:40 minutes Critical care time was exclusive of separately billable procedures and treating other patients. Critical care was necessary to treat or prevent imminent or life-threatening deterioration. Critical care was time spent personally by me on the following activities: development of treatment plan with patient and/or surrogate as well as nursing, discussions with consultants, evaluation of patient's response to treatment,  examination of patient, obtaining history from patient or surrogate, ordering and performing treatments and interventions, ordering and review of laboratory studies, ordering and review of radiographic studies, pulse oximetry and re-evaluation of patient's condition.  Initial Impression / Assessment and Plan / ED Course  I have reviewed the triage vital signs and the nursing notes.  Pertinent labs & imaging results that were available during my care of the patient were reviewed by me and considered in my medical decision making (see chart for details).  Called by radiology concern for possible right-sided pneumothorax.  Given increased shortness of breath decision made to intubate, repeat chest x-ray/obtain CT scan.   Repeat chest x-ray and CT scan without evidence of pneumothorax.  Discussed with critical care who will see patient in the emergency department and admit.   Final Clinical Impressions(s) / ED Diagnoses   Final diagnoses:  COPD exacerbation ([place])  Acute respiratory failure with hypoxia [facility])    ED Discharge Orders    None       Julianne Rice, MD 03/09/19 [phone]

## 2019-03-09 NOTE — ED Notes (Signed)
MD, respiratory at bedside

## 2019-03-09 NOTE — H&P (Signed)
NAME:  [name], MRN:  [id number], DOB:  Aug 25, 1956, LOS: 0 ADMISSION DATE:  03/09/2019, CONSULTATION DATE: 03/09/2019 REFERRING MD:  EDP, CHIEF COMPLAINT:  Acute resp failure, COPD exacerbation  Brief History   63 year old with asthma, COPD, active smoker admitted with dyspnea increased work of breathing, wheezing.  Intubated in EDP. COVID test negative  Per wife he ran out of his maintenance inhalers 3 days before admit and has been using albuterol several times/day.  No recent travel or known sick contacts  Past Medical History  Asthma, COPD, hypertension  Significant Hospital Events   4/18- Intubated  Consults:  PCCM  Procedures:  OETT 4/18>>  Significant Diagnostic Tests:  CT chest 03/09/2019- mild narrowing of the right bronchus intermedius.  Left apical lung nodule.  No mass or infiltrate.  Mild emphysema.  No pneumothorax identified.  I have reviewed the images personally  Micro Data:  SARS COV2 4/18 - negative RVP Flu PCR  Antimicrobials:    Interim history/subjective:    Objective   Blood pressure (!) 145/118, pulse 92, resp. rate 14, height 5\' 9"  (1.753 m), weight 56.2 kg, SpO2 92 %.    Vent Mode: PRVC FiO2 (%):  [40 %-100 %] 40 % Set Rate:  [20 bmp] 20 bmp Vt Set:  [400 mL] 400 mL PEEP:  [5 cmH20] 5 cmH20 Plateau Pressure:  [28 cmH20] 28 cmH20   Intake/Output Summary (Last 24 hours) at 03/09/2019 0935 Last data filed at 03/09/2019 9163 Gross per 24 hour  Intake 50 ml  Output -  Net 50 ml   Filed Weights   03/09/19 0820  Weight: 56.2 kg    Examination: Gen:      Mild distress HEENT:  EOMI, sclera anicteric Neck:     No masses; no thyromegaly ET tube Lungs:    Respiratory distress with prolonged expiratory wheeze CV:         Regular rate and rhythm; no murmurs Abd:      + bowel sounds; soft, non-tender; no palpable masses, no distension Ext:    No edema; adequate peripheral perfusion Skin:      Warm and dry; no rash Neuro: Sedated,  Unresponsive.  Resolved Hospital Problem list     Assessment & Plan:  63 year old with asthma, COPD exacerbation after he ran out of her inhalers  Respiratory failure Asthma, COPD exacerbation Start IV Solu-Medrol Standing duo nebs and Pulmicort Albuterol PRN Check respiratory virus panel, urine drug screen  Elevated Pct No clear evidence of infection Will observe off antibiotics Check cultures, UA  Hypertension Continue Norvasc Hydralazine PRN.  Best practice:  Diet: NPO. Start TF tomorrow if still on vent Pain/Anxiety/Delirium protocol (if indicated): Propofol gtt, Ferntanyl prn, versed prn VAP protocol (if indicated): Yes DVT prophylaxis: Lovenox, SCDs GI prophylaxis: PPI Glucose control: Monitor sugars Mobility: Bed Code Status: Full Family Communication: Wife updated over phone 4/18 Disposition: ICU  Labs   CBC: Recent Labs  Lab 03/09/19 0718  WBC 10.5  NEUTROABS 4.1  HGB 14.5  HCT 45.2  MCV 102.5*  PLT 846    Basic Metabolic Panel: Recent Labs  Lab 03/09/19 0718  NA 143  K 4.7  CL 104  CO2 28  GLUCOSE 97  BUN 18  CREATININE 1.22  CALCIUM 9.7   GFR: Estimated Creatinine Clearance: 48.6 mL/min (by C-G formula based on SCr of 1.22 mg/dL). Recent Labs  Lab 03/09/19 0718  PROCALCITON 30.35  WBC 10.5    Liver Function Tests: Recent Labs  Lab  03/09/19 0718  AST 32  ALT 25  ALKPHOS 59  BILITOT 0.8  PROT 7.1  ALBUMIN 4.1   No results for input(s): LIPASE, AMYLASE in the last 168 hours. No results for input(s): AMMONIA in the last 168 hours.  ABG    Component Value Date/Time   PHART 7.366 11/24/2011 0530   PCO2ART 47.4 (H) 11/24/2011 0530   PO2ART 39.8 (LL) 11/24/2011 0530   HCO3 19.9 (L) 03/23/2018 0819   TCO2 21 (L) 03/23/2018 0819   ACIDBASEDEF 8.0 (H) 03/23/2018 0819   O2SAT 81.0 03/23/2018 0819     Coagulation Profile: No results for input(s): INR, PROTIME in the last 168 hours.  Cardiac Enzymes: No results for  input(s): CKTOTAL, CKMB, CKMBINDEX, TROPONINI in the last 168 hours.  HbA1C: No results found for: HGBA1C  CBG: No results for input(s): GLUCAP in the last 168 hours.  Review of Systems:   Unable to obtain as patient was intubated  Past Medical History  He,  has a past medical history of Asthma, COPD (chronic obstructive pulmonary disease) ([place]), Hypertension, and Shortness of breath.   Surgical History    Past Surgical History:  Procedure Laterality Date  . COLONOSCOPY       Social History   reports that he has been smoking cigarettes. He has a 15.00 pack-year smoking history. He has never used smokeless tobacco. He reports current alcohol use. He reports that he does not use drugs.   Family History   His family history includes Cancer in his father.   Allergies Allergies  Allergen Reactions  . Lisinopril Swelling     Home Medications  Prior to Admission medications   Medication Sig Start Date End Date Taking? Authorizing Provider  acetaminophen (TYLENOL) 500 MG tablet Take 1,000 mg by mouth as needed for mild pain.    [provider]  albuterol (PROVENTIL) (2.5 MG/3ML) 0.083% nebulizer solution USE 1 VIAL IN NEBULIZER 4 TIMES A DAY AS NEEDED FOR SHORTNESS OF BREATH Patient taking differently: Take 2.5 mg by nebulization every 2 (two) hours as needed for wheezing.  06/20/18   [place] Bing, DO  amLODipine (NORVASC) 5 MG tablet TAKE 1 TABLET EVERY DAY Patient taking differently: Take 5 mg by mouth daily.  02/04/19   [place] Bing, DO  aspirin 81 MG EC tablet Take 1 tablet (81 mg total) by mouth daily. 10/04/16   Mayo, Pete Pelt, MD  doxycycline (VIBRA-TABS) 100 MG tablet Take 1 tablet (100 mg total) by mouth 2 (two) times daily. 02/08/19   Sela Hilding, MD  fexofenadine (ALLEGRA) 180 MG tablet Take 1 tablet (180 mg total) by mouth daily. 02/26/14   Frazier Richards, MD  fluticasone (FLONASE) 50 MCG/ACT nasal spray PLACE 2 SPRAYS INTO BOTH NOSTRILS DAILY.  Patient taking differently: Place 2 sprays into both nostrils daily. PLACE 2 SPRAYS INTO BOTH NOSTRILS DAILY. 10/04/16   Mayo, Pete Pelt, MD  Fluticasone-Umeclidin-Vilant (TRELEGY ELLIPTA) 100-62.5-25 MCG/INH AEPB Inhale 1 application into the lungs daily. Patient not taking: Reported on 11/05/2018 10/03/18   [place] Bing, DO  naproxen (NAPROSYN) 500 MG tablet Take 1 tablet (500 mg total) by mouth 2 (two) times daily. 09/19/18   Kinnie Feil, PA-C  predniSONE (DELTASONE) 50 MG tablet Take 1 tablet (50 mg total) by mouth daily with breakfast. 02/08/19   Sela Hilding, MD  SPIRIVA HANDIHALER 18 MCG inhalation capsule INHALE 1 CAPSULE VIA HANDIHALER ONCE DAILY AT [place] DAY Patient taking differently: Place 48  mcg into inhaler and inhale daily at 12 noon.  03/06/19   [place] Bing, DO  WIXELA INHUB 500-50 MCG/DOSE AEPB Inhale 1 puff into the lungs daily at 12 noon.  10/10/18   [provider]    The patient is critically ill with multiple organ system failure and requires high complexity decision making for assessment and support, frequent evaluation and titration of therapies, advanced monitoring, review of radiographic studies and interpretation of complex data.   Critical Care Time devoted to patient care services, exclusive of separately billable procedures, described in this note is 35 minutes.   Marshell Garfinkel MD [facility] 03/09/2019, 10:18 AM

## 2019-03-09 NOTE — ED Notes (Signed)
ED TO INPATIENT HANDOFF REPORT  ED Nurse Name and Phone #: [phone]  S Name/Age/Gender [name] 63 y.o. male Room/Bed: 035C/035C  Code Status   Code Status: Full Code  Home/SNF/Other Home Patient oriented to: self, place, time and situation Is this baseline? Yes   Triage Complete: Triage complete  Chief Complaint sob  Triage Note Per EMS, pt became very SOB last night, tried several albuterol treatments  (4 or more).  Pt was stating 93% when EMS arrived and found him taking another treatment.  Pt arrived on Non-rebreather, feeling better.  Pt given .3 EPI IM, 125 mg Solumedrol IV.   Allergies Allergies  Allergen Reactions  . Lisinopril Swelling    Level of Care/Admitting Diagnosis ED Disposition    ED Disposition Condition [facility]: [place] [[id number]]  Level of Care: ICU [6]  Covid Evaluation: N/A  Diagnosis: COPD exacerbation [facility]) [[id number]]  Admitting Physician: Marshell Garfinkel [[id number]]  Attending Physician: Marshell Garfinkel [[id number]]  Estimated length of stay: past midnight tomorrow  Certification:: I certify this patient will need inpatient services for at least 2 midnights  PT Class (Do Not Modify): Inpatient [101]  PT Acc Code (Do Not Modify): Private [1]       B Medical/Surgery History Past Medical History:  Diagnosis Date  . Asthma   . COPD (chronic obstructive pulmonary disease) ([place])   . Hypertension   . Shortness of breath    Past Surgical History:  Procedure Laterality Date  . COLONOSCOPY       A IV Location/Drains/Wounds Patient Lines/Drains/Airways Status   Active Line/Drains/Airways    Name:   Placement date:   Placement time:   Site:   Days:   Peripheral IV 03/09/19 Left Antecubital   03/09/19    0647    Antecubital   less than 1   Peripheral IV 03/09/19 Right Antecubital   03/09/19    0732    Antecubital   less than 1   NG/OG Tube Orogastric 18 Fr. Right mouth Aucultation;Xray    03/09/19    0830    Right mouth   less than 1   External Urinary Catheter   03/09/19    1027    -   less than 1   Airway 7.5 mm   03/09/19    0819     less than 1          Intake/Output Last 24 hours  Intake/Output Summary (Last 24 hours) at 03/09/2019 1029 Last data filed at 03/09/2019 3299 Gross per 24 hour  Intake 50 ml  Output -  Net 50 ml    Labs/Imaging Results for orders placed or performed during the hospital encounter of 03/09/19 (from the past 48 hour(s))  CBC WITH DIFFERENTIAL     Status: Abnormal   Collection Time: 03/09/19  7:18 AM  Result Value Ref Range   WBC 10.5 4.0 - 10.5 K/uL   RBC 4.41 4.22 - 5.81 MIL/uL   Hemoglobin 14.5 13.0 - 17.0 g/dL   HCT 45.2 39.0 - 52.0 %   MCV 102.5 (H) 80.0 - 100.0 fL   MCH 32.9 26.0 - 34.0 pg   MCHC 32.1 30.0 - 36.0 g/dL   RDW 12.2 11.5 - 15.5 %   Platelets 247 150 - 400 K/uL   nRBC 0.0 0.0 - 0.2 %   Neutrophils Relative % 39 %   Neutro Abs 4.1 1.7 - 7.7 K/uL   Lymphocytes  Relative 43 %   Lymphs Abs 4.5 (H) 0.7 - 4.0 K/uL   Monocytes Relative 9 %   Monocytes Absolute 0.9 0.1 - 1.0 K/uL   Eosinophils Relative 8 %   Eosinophils Absolute 0.8 (H) 0.0 - 0.5 K/uL   Basophils Relative 1 %   Basophils Absolute 0.1 0.0 - 0.1 K/uL   Immature Granulocytes 0 %   Abs Immature Granulocytes 0.02 0.00 - 0.07 K/uL    Comment: Performed at [facility] [address], [place], [place] [address]  Comprehensive metabolic panel     Status: None   Collection Time: 03/09/19  7:18 AM  Result Value Ref Range   Sodium 143 135 - 145 mmol/L   Potassium 4.7 3.5 - 5.1 mmol/L   Chloride 104 98 - 111 mmol/L   CO2 28 22 - 32 mmol/L   Glucose, Bld 97 70 - 99 mg/dL   BUN 18 8 - 23 mg/dL   Creatinine, Ser 1.22 0.61 - 1.24 mg/dL   Calcium 9.7 8.9 - 10.3 mg/dL   Total Protein 7.1 6.5 - 8.1 g/dL   Albumin 4.1 3.5 - 5.0 g/dL   AST 32 15 - 41 U/L   ALT 25 0 - 44 U/L   Alkaline Phosphatase 59 38 - 126 U/L   Total Bilirubin 0.8 0.3 - 1.2 mg/dL    GFR calc non Af Amer >60 >60 mL/min   GFR calc Af Amer >60 >60 mL/min   Anion gap 11 5 - 15    Comment: Performed at [facility], [place] [address], [place], [place] [address]  D-dimer, quantitative     Status: None   Collection Time: 03/09/19  7:18 AM  Result Value Ref Range   D-Dimer, Quant 0.44 0.00 - 0.50 ug/mL-FEU    Comment: (NOTE) At the manufacturer cut-off of 0.50 ug/mL FEU, this assay has been documented to exclude PE with a sensitivity and negative predictive value of 97 to 99%.  At this time, this assay has not been approved by the FDA to exclude DVT/VTE. Results should be correlated with clinical presentation. Performed at [facility], [place] [address] [place], [place] [address]   Procalcitonin     Status: None   Collection Time: 03/09/19  7:18 AM  Result Value Ref Range   Procalcitonin 30.35 ng/mL    Comment:        Interpretation: PCT >= 10 ng/mL: Important systemic inflammatory response, almost exclusively due to severe bacterial sepsis or septic shock. (NOTE)       Sepsis PCT Algorithm           Lower Respiratory Tract                                      Infection PCT Algorithm    ----------------------------     ----------------------------         PCT < 0.25 ng/mL                PCT < 0.10 ng/mL         Strongly encourage             Strongly discourage   discontinuation of antibiotics    initiation of antibiotics    ----------------------------     -----------------------------       PCT 0.25 - 0.50 ng/mL  PCT 0.10 - 0.25 ng/mL               OR       >80% decrease in PCT            Discourage initiation of                                            antibiotics      Encourage discontinuation           of antibiotics    ----------------------------     -----------------------------         PCT >= 0.50 ng/mL              PCT 0.26 - 0.50 ng/mL                AND       <80% decrease in PCT             Encourage initiation of                                              antibiotics       Encourage continuation           of antibiotics    ----------------------------     -----------------------------        PCT >= 0.50 ng/mL                  PCT > 0.50 ng/mL               AND         increase in PCT                  Strongly encourage                                      initiation of antibiotics    Strongly encourage escalation           of antibiotics                                     -----------------------------                                           PCT <= 0.25 ng/mL                                                 OR                                        > 80% decrease in PCT  Discontinue / Do not initiate                                             antibiotics Performed at [facility], [place] [address], [place], [place] [address]   Lactate dehydrogenase     Status: None   Collection Time: 03/09/19  7:18 AM  Result Value Ref Range   LDH 175 98 - 192 U/L    Comment: Performed at [facility], [place] [address], [place], [place] [address]  Ferritin     Status: None   Collection Time: 03/09/19  7:18 AM  Result Value Ref Range   Ferritin 96 24 - 336 ng/mL    Comment: Performed at [facility] [address], [place], [place] [address]  Fibrinogen     Status: None   Collection Time: 03/09/19  7:18 AM  Result Value Ref Range   Fibrinogen 357 210 - 475 mg/dL    Comment: Performed at [facility] [address] [place], [place] [address]  C-reactive protein     Status: None   Collection Time: 03/09/19  7:18 AM  Result Value Ref Range   CRP <0.8 <1.0 mg/dL    Comment: Performed at [facility], [place] [address] [place], [place] [address]  Triglycerides     Status: None   Collection Time: 03/09/19  7:20 AM  Result Value Ref Range   Triglycerides 61 <150 mg/dL    Comment: Performed at [facility] [address] [place], [place]  [address]  SARS Coronavirus 2 [facility] order, Performed in [facility] hospital lab)     Status: None   Collection Time: 03/09/19  7:21 AM  Result Value Ref Range   SARS Coronavirus 2 NEGATIVE NEGATIVE    Comment: (NOTE) If result is NEGATIVE SARS-CoV-2 target nucleic acids are NOT DETECTED. The SARS-CoV-2 RNA is generally detectable in upper and lower  respiratory specimens during the acute phase of infection. The lowest  concentration of SARS-CoV-2 viral copies this assay can detect is 250  copies / mL. A negative result does not preclude SARS-CoV-2 infection  and should not be used as the sole basis for treatment or other  patient management decisions.  A negative result may occur with  improper specimen collection / handling, submission of specimen other  than nasopharyngeal swab, presence of viral mutation(s) within the  areas targeted by this assay, and inadequate number of viral copies  (<250 copies / mL). A negative result must be combined with clinical  observations, patient history, and epidemiological information. If result is POSITIVE SARS-CoV-2 target nucleic acids are DETECTED. The SARS-CoV-2 RNA is generally detectable in upper and lower  respiratory specimens dur ing the acute phase of infection.  Positive  results are indicative of active infection with SARS-CoV-2.  Clinical  correlation with patient history and other diagnostic information is  necessary to determine patient infection status.  Positive results do  not rule out bacterial infection or co-infection with other viruses. If result is PRESUMPTIVE POSTIVE SARS-CoV-2 nucleic acids MAY BE PRESENT.   A presumptive positive result was obtained on the submitted specimen  and confirmed on repeat testing.  While 2019 novel coronavirus  (SARS-CoV-2) nucleic acids may be present in the submitted sample  additional confirmatory testing may be necessary for epidemiological  and /  or clinical management purposes  to  differentiate between  SARS-CoV-2 and other Sarbecovirus currently known to infect humans.  If clinically indicated additional testing with an alternate test  methodology [phone]) is advised. The SARS-CoV-2 RNA is generally  detectable in upper and lower respiratory sp ecimens during the acute  phase of infection. The expected result is Negative. Fact Sheet for Patients:  StrictlyIdeas.no Fact Sheet for Healthcare Providers: BankingDealers.co.za This test is not yet approved or cleared by the [place] FDA and has been authorized for detection and/or diagnosis of SARS-CoV-2 by FDA under an Emergency Use Authorization (EUA).  This EUA will remain in effect (meaning this test can be used) for the duration of the COVID-19 declaration under Section 564(b)(1) of the Act, 21 U.S.C. section 360bbb-3(b)(1), unless the authorization is terminated or revoked sooner. Performed at [facility], [place] [address], [place], [place] [address]   I-STAT 7, (LYTES, BLD GAS, ICA, H+H)     Status: Abnormal   Collection Time: 03/09/19 10:10 AM  Result Value Ref Range   pH, Arterial 7.273 (L) 7.350 - 7.450   pCO2 arterial 48.3 (H) 32.0 - 48.0 mmHg   pO2, Arterial 85.0 83.0 - 108.0 mmHg   Bicarbonate 22.3 20.0 - 28.0 mmol/L   TCO2 24 22 - 32 mmol/L   O2 Saturation 95.0 %   Acid-base deficit 5.0 (H) 0.0 - 2.0 mmol/L   Sodium 138 135 - 145 mmol/L   Potassium 4.3 3.5 - 5.1 mmol/L   Calcium, Ion 1.24 1.15 - 1.40 mmol/L   HCT 41.0 39.0 - 52.0 %   Hemoglobin 13.9 13.0 - 17.0 g/dL   Patient temperature 98.6 F    Collection site RADIAL, ALLEN'S TEST ACCEPTABLE    Drawn by Operator    Sample type ARTERIAL    Ct Chest W Contrast  Result Date: 03/09/2019 CLINICAL DATA:  Shortness of breath.  Pneumothorax suspected. EXAM: CT CHEST WITH CONTRAST TECHNIQUE: Multidetector CT imaging of the chest was performed during intravenous contrast administration.  CONTRAST:  73mL OMNIPAQUE IOHEXOL 300 MG/ML  SOLN COMPARISON:  March 09, 2019 chest x-ray FINDINGS: Cardiovascular: Mild atherosclerotic changes in the nonaneurysmal aorta without dissection. The heart size is normal. The central pulmonary arteries are unremarkable. Mediastinum/Nodes: The NG tube terminates below the left hemidiaphragm. No pneumothorax. No adenopathy. Lungs/Pleura: The ETT terminates in good position. The trachea and mainstem bronchi are normal. Mild narrowing of the right bronchus intermedius as seen on axial image 77 and sagittal image 68 with no endobronchial lesion identified. The remainder of the airways are normal within visualize limits. No pneumothorax. Evaluation of the bases is limited due to respiratory motion. There is a nodule in the left apex measuring 5 by 5 by 3 mm with a mean diameter of 4 mm. No other nodules. No masses or infiltrates. Mild emphysematous changes in the apices. Upper Abdomen: There is a cyst identified in the liver. Upper abdomen otherwise unremarkable. Musculoskeletal: No chest wall abnormality. No acute or significant osseous findings. IMPRESSION: 1. No pneumothorax. 2. Smooth mild narrowing of the right bronchus intermedius without identified mass or endobronchial lesion. No associated wall thickening. This is a nonspecific finding. Recommend attention on short-term follow-up when the patient's symptoms have stabilized and a study may be performed with less respiratory motion. 3. There is a 4 mm nodule in the left apex which is nonspecific. No follow-up needed if patient is low-risk. Non-contrast chest CT can be considered in 12 months if patient is high-risk.  This recommendation follows the consensus statement: Guidelines for Management of Incidental Pulmonary Nodules Detected on CT Images: From the Fleischner Society 2017; Radiology 2017; 284:228-243. 4. Mild emphysematous changes in the lung apices. 5. Mild atherosclerotic changes in the nonaneurysmal aorta.  Aortic Atherosclerosis (ICD10-I70.0). Electronically Signed   By: Dorise Bullion III M.D   On: 03/09/2019 09:22   Dg Chest Portable 1 View  Result Date: 03/09/2019 CLINICAL DATA:  63 year old male with respiratory failure, now intubated EXAM: PORTABLE CHEST 1 VIEW COMPARISON:  Prior chest x-ray 03/09/2019 at 7:14 a.m. FINDINGS: Interval intubation. The tip of the endotracheal tube is in good position 6 cm above the carina. A gastric tube has also been placed. The tip of the tube is not visualized but lies below the diaphragm, presumably within the stomach. No findings to suggest pneumothorax. No focal airspace opacity, pulmonary edema or pleural effusion. No acute osseous abnormality. IMPRESSION: 1. The tip of the newly placed endotracheal tube is 6 cm above the carina. 2. Gastric tube in place, the tip lies off the field of view, below the diaphragm and presumably within the stomach. 3. No pneumothorax identified. Previously noted linear opacity was likely artifactual. Electronically Signed   By: Jacqulynn Cadet M.D.   On: 03/09/2019 08:54   Dg Chest Port 1 View  Result Date: 03/09/2019 CLINICAL DATA:  Severe SOB this morning, even with albuterol treatment.sob EXAM: PORTABLE CHEST 1 VIEW COMPARISON:  10/13/2018 FINDINGS: Normal cardiac silhouette. Lungs are hyperinflated. Thin linear edge extending vertically in the lateral aspect of the RIGHT upper lobe terminates at the horizontal fissure. Concern for partial collapse of the RIGHT upper lobe. Potential pleura edge 23 mm from the chest wall. Otherwise lungs are clear. IMPRESSION: Concern for atypical RIGHT upper lobe pneumothorax with a vertical pleural edge versus externally artifact. Volume of potential pneumothorax is small (approximately 10% hemithorax volume). Findings conveyed toYellvertonon 03/09/2019  at08:02. Electronically Signed   By: Suzy Bouchard M.D.   On: 03/09/2019 08:13    Pending Labs Unresulted Labs (From admission, onward)     Start     Ordered   03/16/19 0500  Creatinine, serum  (enoxaparin (LOVENOX)    CrCl >/= 30 ml/min)  Weekly,   R    Comments:  while on enoxaparin therapy    03/09/19 0956   03/10/19 0500  CBC  Tomorrow morning,   R     03/09/19 0956   03/10/19 7902  Basic metabolic panel  Tomorrow morning,   R     03/09/19 0956   03/10/19 0500  Blood gas, arterial  Tomorrow morning,   R     03/09/19 0956   03/10/19 0500  Magnesium  Tomorrow morning,   R     03/09/19 0956   03/10/19 0500  Phosphorus  Tomorrow morning,   R     03/09/19 0956   03/10/19 0500  CBC  Tomorrow morning,   R     03/09/19 0957   03/09/19 1010  Urine rapid drug screen (hosp performed)  ONCE - STAT,   R     03/09/19 1009   03/09/19 1000  Respiratory Panel by PCR  (Respiratory virus panel with precautions)  Once,   R     03/09/19 0959   03/09/19 1000  Influenza panel by PCR (type A & B)  (Influenza PCR Panel)  Once,   R     03/09/19 0959   03/09/19 0956  Culture, blood (routine x 2)  BLOOD CULTURE  X 2,   R     03/09/19 0956   03/09/19 0956  Urine culture  Once,   R     03/09/19 0956   03/09/19 0956  Culture, respiratory (tracheal aspirate)  Once,   R     03/09/19 0956   03/09/19 0956  Urinalysis, Routine w reflex microscopic  Once,   R     03/09/19 0956   03/09/19 0956  Blood gas, arterial  Once,   R     03/09/19 0956   03/09/19 0955  Creatinine, serum  (enoxaparin (LOVENOX)    CrCl >/= 30 ml/min)  Once,   R    Comments:  Baseline for enoxaparin therapy IF NOT ALREADY DRAWN.    03/09/19 0956   03/09/19 0954  HIV antibody (Routine Testing)  Once,   R     03/09/19 0956          Vitals/Pain Today's Vitals   03/09/19 1012 03/09/19 1014 03/09/19 1025 03/09/19 1025  BP: (!) 148/70 123/75    Pulse: 96 94    Resp: (!) 22 18    Temp:    97.7 F (36.5 C)  TempSrc:    Rectal  SpO2: 95% 98% 100%   Weight:      Height:      PainSc:        Isolation Precautions Droplet precaution  Medications Medications  propofol  (DIPRIVAN) 1000 MG/100ML infusion (70 mcg/kg/min  56.2 kg Intravenous Rate/Dose Change 03/09/19 1011)  enoxaparin (LOVENOX) injection 40 mg (has no administration in time range)  pantoprazole (PROTONIX) injection 40 mg (has no administration in time range)  ipratropium-albuterol (DUONEB) 0.5-2.5 (3) MG/3ML nebulizer solution 3 mL ( Nebulization Canceled Entry 03/09/19 1001)  budesonide (PULMICORT) nebulizer solution 0.5 mg (has no administration in time range)  albuterol (PROVENTIL) (2.5 MG/3ML) 0.083% nebulizer solution 2.5 mg (has no administration in time range)  methylPREDNISolone sodium succinate (SOLU-MEDROL) 125 mg/2 mL injection 60 mg (has no administration in time range)  midazolam (VERSED) injection 2 mg (2 mg Intravenous Given 03/09/19 1022)  midazolam (VERSED) injection 2 mg (has no administration in time range)  fentaNYL (SUBLIMAZE) injection 50 mcg (has no administration in time range)  fentaNYL (SUBLIMAZE) injection 50-200 mcg (has no administration in time range)  amLODipine (NORVASC) tablet 5 mg (has no administration in time range)  albuterol (VENTOLIN HFA) 108 (90 Base) MCG/ACT inhaler 8 puff (8 puffs Inhalation Given 03/09/19 0705)  AeroChamber Plus Flo-Vu Large MISC 1 each (1 each Other Given 03/09/19 0704)  magnesium sulfate IVPB 2 g 50 mL (0 g Intravenous Stopped 03/09/19 0812)  hydrALAZINE (APRESOLINE) injection 10 mg (10 mg Intravenous Given 03/09/19 0734)  etomidate (AMIDATE) injection (20 mg Intravenous Given 03/09/19 0815)  succinylcholine (ANECTINE) injection (100 mg Intravenous Given 03/09/19 0817)  fentaNYL (SUBLIMAZE) injection ( Intravenous Canceled Entry 03/09/19 0845)  iohexol (OMNIPAQUE) 300 MG/ML solution 75 mL (75 mLs Intravenous Contrast Given 03/09/19 0901)  fentaNYL (SUBLIMAZE) injection 100 mcg (100 mcg Intravenous Given 03/09/19 0923)  ipratropium-albuterol (DUONEB) 0.5-2.5 (3) MG/3ML nebulizer solution (3 mLs  Given 03/09/19 1000)    Mobility walks Low fall  risk   Focused Assessments Pulmonary Assessment Handoff:  Lung sounds: Bilateral Breath Sounds: Diminished, Expiratory wheezes L Breath Sounds: Diminished R Breath Sounds: Diminished O2 Device: Ventilator O2 Flow Rate (L/min): 15 L/min      R Recommendations: See Admitting Provider Note  Report given to:   Additional Notes:

## 2019-03-09 NOTE — ED Notes (Signed)
Pt noted to have increased work of breathing, retractions; MD at bedside

## 2019-03-09 NOTE — Progress Notes (Signed)
Initial Nutrition Assessment  RD working remotely.   DOCUMENTATION CODES:   Underweight(unable to assess for malnutrition at this time. )  INTERVENTION:  - will order TF: Vital High Protein @ 30 ml/hr with 60 ml prostat once/day and 100 ml free water QID.  - this regimen + kcal from current propofol rate will provide 1543 kcal, 93 grams protein, and 1002 ml free water.  - will monitor propofol rate and changes.  - will monitor for ability to switch TF formula from Vital High Protein to a different formula d/t concern for shortages.     NUTRITION DIAGNOSIS:   Inadequate oral intake related to inability to eat as evidenced by NPO status.  GOAL:   Patient will meet greater than or equal to 90% of their needs  MONITOR:   Vent status, Labs, Weight trends  REASON FOR ASSESSMENT:   Ventilator  ASSESSMENT:   63 year old male with asthma, COPD, and is an active smoker. He was admitted with dyspnea, increased WOB, and wheezing and was subsequently intubated in the ED. COVID test negative. Patient's wife reported that he ran out of his maintenance inhalers 3 days before admit and has been using albuterol several times/day.  Patient is intubated and OGT placed ~8:30 AM today. Per RN flow sheet, tube is currently clamped. Per chart review, current weight is 124 lb and weight on 11/23 was 130 lb. This indicates 6 lb weight loss (4.6% body weight) in the past 5 months; not significant for time frame.   Per Dr. Matilde Bash note/H&P this AM: respiratory failure, asthma/COPD exacerbation, COVID-19 negative, plan to check respiratory virus panel, urine drug screen checked (positive for cocaine).   Able to communicate with Dr. Vaughan Browner via secure chat and he states okay to start TF today.    Patient is currently intubated on ventilator support MV: 9.9 L/min as of 11:36 AM Temp (24hrs), Avg:98 F (36.7 C), Min:97.7 F (36.5 C), Max:98.3 F (36.8 C) Propofol: 23.6 ml/hr (623  kcal)  Medications reviewed; 2g IV Mg sulfate x1 run 4/18, 60 mg solu-medrol QID. Labs reviewed; CBG: 148 mg/dl today.  Drip; propofol 2 70 mcg/kg/min.     NUTRITION - FOCUSED PHYSICAL EXAM:  unable to complete at this time.  Diet Order:   Diet Order            Diet NPO time specified  Diet effective now              EDUCATION NEEDS:   No education needs have been identified at this time  Skin:  Skin Assessment: Reviewed RN Assessment  Last BM:  PTA/unknown  Height:   Ht Readings from Last 1 Encounters:  03/09/19 5\' 9"  (1.753 m)    Weight:   Wt Readings from Last 1 Encounters:  03/09/19 56.2 kg    Ideal Body Weight:  72.73 kg  BMI:  Body mass index is 18.31 kg/m.  Estimated Nutritional Needs:   Kcal:  1534 kcal  Protein:  84-112 grams  Fluid:  >/= 1.8 L/day     Jarome Matin, MS, RD, LDN, [facility] Inpatient Clinical Dietitian Pager # [phone] After hours/weekend pager # [phone]

## 2019-03-09 NOTE — ED Notes (Signed)
Help get patient up in the bed did rectal temp patient is resting with nurse at bedside

## 2019-03-09 NOTE — Progress Notes (Signed)
Patient intubated due to increased SOB, and WOB, good color change on ETCO2 detector, good BBS ausculte over lung fields, SATS 100%, placed on above vent settings, MD in room.

## 2019-03-09 NOTE — ED Notes (Signed)
intensivist at bedside.

## 2019-03-09 NOTE — ED Notes (Signed)
sats noted to be dropping upper 80s, low 90s; MD, respiratory notified

## 2019-03-09 NOTE — ED Triage Notes (Signed)
Per EMS, pt became very SOB last night, tried several albuterol treatments  (4 or more).  Pt was stating 93% when EMS arrived and found him taking another treatment.  Pt arrived on Non-rebreather, feeling better.  Pt given .3 EPI IM, 125 mg Solumedrol IV.

## 2019-03-09 NOTE — ED Notes (Signed)
[name] (wife) [phone]

## 2019-03-10 ENCOUNTER — Inpatient Hospital Stay (HOSPITAL_COMMUNITY): Payer: Medicare HMO

## 2019-03-10 DIAGNOSIS — J9601 Acute respiratory failure with hypoxia: Secondary | ICD-10-CM

## 2019-03-10 LAB — POCT I-STAT 7, (LYTES, BLD GAS, ICA,H+H)
Acid-base deficit: 2 mmol/L (ref 0.0–2.0)
Bicarbonate: 22.9 mmol/L (ref 20.0–28.0)
Calcium, Ion: 1.28 mmol/L (ref 1.15–1.40)
HCT: 40 % (ref 39.0–52.0)
Hemoglobin: 13.6 g/dL (ref 13.0–17.0)
O2 Saturation: 93 %
Patient temperature: 98.6
Potassium: 4.5 mmol/L (ref 3.5–5.1)
Sodium: 138 mmol/L (ref 135–145)
TCO2: 24 mmol/L (ref 22–32)
pCO2 arterial: 39 mmHg (ref 32.0–48.0)
pH, Arterial: 7.377 (ref 7.350–7.450)
pO2, Arterial: 68 mmHg — ABNORMAL LOW (ref 83.0–108.0)

## 2019-03-10 LAB — BASIC METABOLIC PANEL
Anion gap: 13 (ref 5–15)
BUN: 20 mg/dL (ref 8–23)
CO2: 22 mmol/L (ref 22–32)
Calcium: 9.5 mg/dL (ref 8.9–10.3)
Chloride: 104 mmol/L (ref 98–111)
Creatinine, Ser: 1.38 mg/dL — ABNORMAL HIGH (ref 0.61–1.24)
GFR calc Af Amer: >60 mL/min (ref >60)
GFR calc non Af Amer: 54 mL/min — ABNORMAL LOW (ref >60)
Glucose, Bld: 147 mg/dL — ABNORMAL HIGH (ref 70–99)
Potassium: 4.5 mmol/L (ref 3.5–5.1)
Sodium: 139 mmol/L (ref 135–145)

## 2019-03-10 LAB — GLUCOSE, CAPILLARY
Glucose-Capillary: 111 mg/dL — ABNORMAL HIGH (ref 70–99)
Glucose-Capillary: 121 mg/dL — ABNORMAL HIGH (ref 70–99)
Glucose-Capillary: 123 mg/dL — ABNORMAL HIGH (ref 70–99)
Glucose-Capillary: 126 mg/dL — ABNORMAL HIGH (ref 70–99)
Glucose-Capillary: 135 mg/dL — ABNORMAL HIGH (ref 70–99)
Glucose-Capillary: 155 mg/dL — ABNORMAL HIGH (ref 70–99)
Glucose-Capillary: 95 mg/dL (ref 70–99)

## 2019-03-10 LAB — PHOSPHORUS: Phosphorus: 3.3 mg/dL (ref 2.5–4.6)

## 2019-03-10 LAB — CBC
HCT: 40.5 % (ref 39.0–52.0)
Hemoglobin: 12.8 g/dL — ABNORMAL LOW (ref 13.0–17.0)
MCH: 31.9 pg (ref 26.0–34.0)
MCHC: 31.6 g/dL (ref 30.0–36.0)
MCV: 101 fL — ABNORMAL HIGH (ref 80.0–100.0)
Platelets: 211 10*3/uL (ref 150–400)
RBC: 4.01 MIL/uL — ABNORMAL LOW (ref 4.22–5.81)
RDW: 12.1 % (ref 11.5–15.5)
WBC: 11.3 10*3/uL — ABNORMAL HIGH (ref 4.0–10.5)
nRBC: 0 % (ref 0.0–0.2)

## 2019-03-10 LAB — URINE CULTURE: Culture: NO GROWTH

## 2019-03-10 LAB — MAGNESIUM: Magnesium: 2.2 mg/dL (ref 1.7–2.4)

## 2019-03-10 MED ORDER — PANTOPRAZOLE SODIUM 40 MG PO PACK
40.0000 mg | PACK | Freq: Every day | ORAL | Status: DC
  Administered 2019-03-10 – 2019-03-11 (×2): 40 mg
  Filled 2019-03-10 (×2): qty 20

## 2019-03-10 MED ORDER — ORAL CARE MOUTH RINSE
15.0000 mL | Freq: Two times a day (BID) | OROMUCOSAL | Status: DC
  Administered 2019-03-10 (×2): 15 mL via OROMUCOSAL

## 2019-03-10 NOTE — Progress Notes (Signed)
Pt tolerated ice chips well  Primary RN aware Advanced diet and assisted pt in ordering lunch tray  Will continue to monitor

## 2019-03-10 NOTE — Progress Notes (Signed)
NAME:  [name], MRN:  [id number], DOB:  09-27-1956, LOS: 1 ADMISSION DATE:  03/09/2019, CONSULTATION DATE: 03/09/2019 REFERRING MD:  EDP, CHIEF COMPLAINT:  Acute resp failure, COPD exacerbation  Brief History   63 year old with asthma, COPD, active smoker admitted with dyspnea increased work of breathing, wheezing.  Intubated in EDP. COVID test negative  Per wife he ran out of his maintenance inhalers 3 days before admit and has been using albuterol several times/day.  No recent travel or known sick contacts  Past Medical History  Asthma, COPD, hypertension  Significant Hospital Events   4/18- Intubated  Consults:  PCCM  Procedures:  OETT 4/18>>  Significant Diagnostic Tests:  CT chest 03/09/2019- mild narrowing of the right bronchus intermedius.  Left apical lung nodule.  No mass or infiltrate.  Mild emphysema.  No pneumothorax identified.  I have reviewed the images personally  Micro Data:  SARS COV2 4/18 - negative RVP Flu PCR  Antimicrobials:    Interim history/subjective:  Intubated, on vent overnight.  Now improved.   Objective   Blood pressure (!) 151/75, pulse 95, temperature 98.1 F (36.7 C), temperature source Oral, resp. rate 17, height 5\' 9"  (1.753 m), weight 56.2 kg, SpO2 98 %.    Vent Mode: CPAP;PSV FiO2 (%):  [40 %] 40 % Set Rate:  [20 bmp] 20 bmp Vt Set:  [500 mL] 500 mL PEEP:  [5 cmH20] 5 cmH20 Pressure Support:  [5 cmH20] 5 cmH20 Plateau Pressure:  [12 cmH20-13 cmH20] 13 cmH20   Intake/Output Summary (Last 24 hours) at 03/10/2019 1715 Last data filed at 03/10/2019 1402 Gross per 24 hour  Intake 1581.96 ml  Output 1525 ml  Net 56.96 ml   Filed Weights   03/09/19 0820  Weight: 56.2 kg    Examination: Gen:     NAD, doingwell on SBT HEENT:  EOMI, sclera anicteric Neck:     No masses; no thyromegaly ET tube Lungs:  No wheezes CV:         Regular rate and rhythm; no murmurs Abd:      + bowel sounds; soft, non-tender; no palpable  masses, no distension Ext:    No edema; adequate peripheral perfusion Skin:      Warm and dry; no rash   Resolved Hospital Problem list     Assessment & Plan:  63 year old with asthma, COPD exacerbation after he ran out of her inhalers  Respiratory failure Asthma, COPD exacerbation cont IV Solu-Medrol Standing duo nebs and Pulmicort Albuterol PRN Will extubate now  Elevated Pct No clear evidence of infection Will observe off antibiotics Check cultures, UA  Hypertension Continue Norvasc Hydralazine PRN.  Best practice:  Diet: NPO.  Pain/Anxiety/Delirium protocol (if indicated): Propofol gtt, Ferntanyl prn, versed prn VAP protocol (if indicated): Yes DVT prophylaxis: Lovenox, SCDs GI prophylaxis: PPI Glucose control: Monitor sugars Mobility: Bed Code Status: Full Family Communication: Disposition: ICU  Labs   CBC: Recent Labs  Lab 03/09/19 0718 03/09/19 1010 03/10/19 0318 03/10/19 0415  WBC 10.5  --   --  11.3*  NEUTROABS 4.1  --   --   --   HGB 14.5 13.9 13.6 12.8*  HCT 45.2 41.0 40.0 40.5  MCV 102.5*  --   --  101.0*  PLT 247  --   --  622    Basic Metabolic Panel: Recent Labs  Lab 03/09/19 0718 03/09/19 1010 03/09/19 1024 03/10/19 0318 03/10/19 0415  NA 143 138  --  138 139  K 4.7  4.3  --  4.5 4.5  CL 104  --   --   --  104  CO2 28  --   --   --  22  GLUCOSE 97  --   --   --  147*  BUN 18  --   --   --  20  CREATININE 1.22  --  1.23  --  1.38*  CALCIUM 9.7  --   --   --  9.5  MG  --   --   --   --  2.2  PHOS  --   --   --   --  3.3   GFR: Estimated Creatinine Clearance: 43 mL/min (A) (by C-G formula based on SCr of 1.38 mg/dL (H)). Recent Labs  Lab 03/09/19 0718 03/10/19 0415  PROCALCITON 30.35  --   WBC 10.5 11.3*    Liver Function Tests: Recent Labs  Lab 03/09/19 0718  AST 32  ALT 25  ALKPHOS 59  BILITOT 0.8  PROT 7.1  ALBUMIN 4.1   No results for input(s): LIPASE, AMYLASE in the last 168 hours. No results for  input(s): AMMONIA in the last 168 hours.  ABG    Component Value Date/Time   PHART 7.377 03/10/2019 0318   PCO2ART 39.0 03/10/2019 0318   PO2ART 68.0 (L) 03/10/2019 0318   HCO3 22.9 03/10/2019 0318   TCO2 24 03/10/2019 0318   ACIDBASEDEF 2.0 03/10/2019 0318   O2SAT 93.0 03/10/2019 0318     Coagulation Profile: No results for input(s): INR, PROTIME in the last 168 hours.  Cardiac Enzymes: No results for input(s): CKTOTAL, CKMB, CKMBINDEX, TROPONINI in the last 168 hours.  HbA1C: No results found for: HGBA1C  CBG: Recent Labs  Lab 03/10/19 0009 03/10/19 0413 03/10/19 0830 03/10/19 1208 03/10/19 1643  GLUCAP 126* 135* 121* 95 123*    Review of Systems:   Unable to obtain as patient was intubated  Past Medical History  He,  has a past medical history of Asthma, COPD (chronic obstructive pulmonary disease) ([place]), Hypertension, and Shortness of breath.   Surgical History    Past Surgical History:  Procedure Laterality Date  . COLONOSCOPY       Social History   reports that he has been smoking cigarettes. He has a 15.00 pack-year smoking history. He has never used smokeless tobacco. He reports current alcohol use. He reports that he does not use drugs.   Family History   His family history includes Cancer in his father.   Allergies Allergies  Allergen Reactions  . Lisinopril Swelling     Home Medications  Prior to Admission medications   Medication Sig Start Date End Date Taking? Authorizing Provider  acetaminophen (TYLENOL) 500 MG tablet Take 1,000 mg by mouth as needed for mild pain.    [provider]  albuterol (PROVENTIL) (2.5 MG/3ML) 0.083% nebulizer solution USE 1 VIAL IN NEBULIZER 4 TIMES A DAY AS NEEDED FOR SHORTNESS OF BREATH Patient taking differently: Take 2.5 mg by nebulization every 2 (two) hours as needed for wheezing.  06/20/18   [place] Bing, DO  amLODipine (NORVASC) 5 MG tablet TAKE 1 TABLET EVERY DAY Patient taking  differently: Take 5 mg by mouth daily.  02/04/19   [place] Bing, DO  aspirin 81 MG EC tablet Take 1 tablet (81 mg total) by mouth daily. 10/04/16   Mayo, Pete Pelt, MD  doxycycline (VIBRA-TABS) 100 MG tablet Take 1 tablet (100 mg total) by mouth 2 (two)  times daily. 02/08/19   Sela Hilding, MD  fexofenadine (ALLEGRA) 180 MG tablet Take 1 tablet (180 mg total) by mouth daily. 02/26/14   [place] Richards, MD  fluticasone (FLONASE) 50 MCG/ACT nasal spray PLACE 2 SPRAYS INTO BOTH NOSTRILS DAILY. Patient taking differently: Place 2 sprays into both nostrils daily. PLACE 2 SPRAYS INTO BOTH NOSTRILS DAILY. 10/04/16   Mayo, Pete Pelt, MD  Fluticasone-Umeclidin-Vilant (TRELEGY ELLIPTA) 100-62.5-25 MCG/INH AEPB Inhale 1 application into the lungs daily. Patient not taking: Reported on 11/05/2018 10/03/18   [place] Bing, DO  naproxen (NAPROSYN) 500 MG tablet Take 1 tablet (500 mg total) by mouth 2 (two) times daily. 09/19/18   Kinnie Feil, PA-C  predniSONE (DELTASONE) 50 MG tablet Take 1 tablet (50 mg total) by mouth daily with breakfast. 02/08/19   Sela Hilding, MD  SPIRIVA HANDIHALER 18 MCG inhalation capsule INHALE 1 CAPSULE VIA HANDIHALER ONCE DAILY AT [place] Patient taking differently: Place 18 mcg into inhaler and inhale daily at 12 noon.  03/06/19   [place] Bing, DO  WIXELA INHUB 500-50 MCG/DOSE AEPB Inhale 1 puff into the lungs daily at 12 noon.  10/10/18   [provider]    Critical care time 45 min.

## 2019-03-10 NOTE — Progress Notes (Signed)
PROGRESS NOTE    [name]  XQJ:[id number] DOB: 09-13-1956 DOA: 03/09/2019 PCP:  Bing, DO  Outpatient Specialists:   Brief Narrative: Patient is a 63 year old African-American male with past medical history significant for asthma, COPD, tobacco use disorder and hypertension.  Apparently, patient has not been very compliant.  Patient reported 2 prior intubations.  Patient missed filling his prescriptions for inhalers 2 days prior to presentation.  Patient presented with respiratory failure.  Patient was intubated and admitted to ICU overnight.  Patient has just been extubated, and is currently on room air.  Patient be transferred to telemetry floor for further assessment and management.  03/10/2019: Patient passed swallowing evaluation done earlier today.  Patient continues to improve.  No new complaints.   Assessment & Plan:   Active Problems:   COPD exacerbation ([place])  Acute respiratory failure/exacerbation of combined COPD and asthma: Patient has just been extubated, and is on room air. Continue nebulizer treatment Continue IV steroids Need to comply with medication discussed extensively with the patient. Further management will depend on hospital course. 03/10/2019: Resolved.  Patient is currently on 2 L of supplemental oxygen via nasal cannula.  Tobacco use disorder: Counseled patient to quit tobacco use.  Noncompliance with medication: Counseled patient.  Hypertension: Patient blood pressure was initially elevated on presentation, but likely due to respiratory distress. Currently, blood pressure is optimized. Continue to monitor closely.  CKD 3: Likely at baseline. Continue to monitor.   DVT prophylaxis: Subcutaneous Lovenox Code Status: Full code Family Communication:  Disposition Plan: Home eventually   Consultants:   Pulmonary/critical care  Procedures:   Intubation and extubation  Antimicrobials:   None   Subjective: No new  complaints No shortness of breath No chest pain No fever or chills  Objective: Vitals:   03/10/19 1206 03/10/19 1300 03/10/19 1405 03/10/19 1431  BP:  (!) 148/94  (!) 151/75  Pulse:  99  95  Resp:  17    Temp: 98.6 F (37 C)   98.1 F (36.7 C)  TempSrc: Oral   Oral  SpO2:  94% 96% 98%  Weight:      Height:        Intake/Output Summary (Last 24 hours) at 03/10/2019 1537 Last data filed at 03/10/2019 1402 Gross per 24 hour  Intake 1581.96 ml  Output 2025 ml  Net -443.04 ml   Filed Weights   03/09/19 0820  Weight: 56.2 kg    Examination:  General exam: Appears calm and comfortable  Respiratory system: Decreased air entry.  Mild wheeze Cardiovascular system: S1 & S2 heard. Gastrointestinal system: Abdomen is nondistended, soft and nontender. No organomegaly or masses felt. Normal bowel sounds heard. Central nervous system: Alert and oriented.  Patient moves all extremities.   Extremities: No leg edema  Data Reviewed: I have personally reviewed following labs and imaging studies  CBC: Recent Labs  Lab 03/09/19 0718 03/09/19 1010 03/10/19 0318 03/10/19 0415  WBC 10.5  --   --  11.3*  NEUTROABS 4.1  --   --   --   HGB 14.5 13.9 13.6 12.8*  HCT 45.2 41.0 40.0 40.5  MCV 102.5*  --   --  101.0*  PLT 247  --   --  448   Basic Metabolic Panel: Recent Labs  Lab 03/09/19 0718 03/09/19 1010 03/09/19 1024 03/10/19 0318 03/10/19 0415  NA 143 138  --  138 139  K 4.7 4.3  --  4.5 4.5  CL 104  --   --   --  104  CO2 28  --   --   --  22  GLUCOSE 97  --   --   --  147*  BUN 18  --   --   --  20  CREATININE 1.22  --  1.23  --  1.38*  CALCIUM 9.7  --   --   --  9.5  MG  --   --   --   --  2.2  PHOS  --   --   --   --  3.3   GFR: Estimated Creatinine Clearance: 43 mL/min (A) (by C-G formula based on SCr of 1.38 mg/dL (H)). Liver Function Tests: Recent Labs  Lab 03/09/19 0718  AST 32  ALT 25  ALKPHOS 59  BILITOT 0.8  PROT 7.1  ALBUMIN 4.1   No results for  input(s): LIPASE, AMYLASE in the last 168 hours. No results for input(s): AMMONIA in the last 168 hours. Coagulation Profile: No results for input(s): INR, PROTIME in the last 168 hours. Cardiac Enzymes: No results for input(s): CKTOTAL, CKMB, CKMBINDEX, TROPONINI in the last 168 hours. BNP (last 3 results) No results for input(s): PROBNP in the last 8760 hours. HbA1C: No results for input(s): HGBA1C in the last 72 hours. CBG: Recent Labs  Lab 03/09/19 1944 03/10/19 0009 03/10/19 0413 03/10/19 0830 03/10/19 1208  GLUCAP 131* 126* 135* 121* 95   Lipid Profile: Recent Labs    03/09/19 0720  TRIG 61   Thyroid Function Tests: No results for input(s): TSH, T4TOTAL, FREET4, T3FREE, THYROIDAB in the last 72 hours. Anemia Panel: Recent Labs    03/09/19 0718  FERRITIN 96   Urine analysis:    Component Value Date/Time   COLORURINE STRAW (A) 03/09/2019 1037   APPEARANCEUR CLEAR 03/09/2019 1037   LABSPEC 1.012 03/09/2019 1037   PHURINE 5.0 03/09/2019 1037   GLUCOSEU NEGATIVE 03/09/2019 1037   HGBUR NEGATIVE 03/09/2019 1037   BILIRUBINUR NEGATIVE 03/09/2019 1037   KETONESUR NEGATIVE 03/09/2019 1037   PROTEINUR NEGATIVE 03/09/2019 1037   NITRITE NEGATIVE 03/09/2019 1037   LEUKOCYTESUR NEGATIVE 03/09/2019 1037   Sepsis Labs: @LABRCNTIP (procalcitonin:4,lacticidven:4)  ) Recent Results (from the past 240 hour(s))  SARS Coronavirus 2 [facility] order, Performed in [place] hospital lab)     Status: None   Collection Time: 03/09/19  7:21 AM  Result Value Ref Range Status   SARS Coronavirus 2 NEGATIVE NEGATIVE Final    Comment: (NOTE) If result is NEGATIVE SARS-CoV-2 target nucleic acids are NOT DETECTED. The SARS-CoV-2 RNA is generally detectable in upper and lower  respiratory specimens during the acute phase of infection. The lowest  concentration of SARS-CoV-2 viral copies this assay can detect is 250  copies / mL. A negative result does not preclude SARS-CoV-2  infection  and should not be used as the sole basis for treatment or other  patient management decisions.  A negative result may occur with  improper specimen collection / handling, submission of specimen other  than nasopharyngeal swab, presence of viral mutation(s) within the  areas targeted by this assay, and inadequate number of viral copies  (<250 copies / mL). A negative result must be combined with clinical  observations, patient history, and epidemiological information. If result is POSITIVE SARS-CoV-2 target nucleic acids are DETECTED. The SARS-CoV-2 RNA is generally detectable in upper and lower  respiratory specimens dur ing the acute phase of infection.  Positive  results are indicative of active infection with SARS-CoV-2.  Clinical  correlation with  patient history and other diagnostic information is  necessary to determine patient infection status.  Positive results do  not rule out bacterial infection or co-infection with other viruses. If result is PRESUMPTIVE POSTIVE SARS-CoV-2 nucleic acids MAY BE PRESENT.   A presumptive positive result was obtained on the submitted specimen  and confirmed on repeat testing.  While 2019 novel coronavirus  (SARS-CoV-2) nucleic acids may be present in the submitted sample  additional confirmatory testing may be necessary for epidemiological  and / or clinical management purposes  to differentiate between  SARS-CoV-2 and other Sarbecovirus currently known to infect humans.  If clinically indicated additional testing with an alternate test  methodology [phone]) is advised. The SARS-CoV-2 RNA is generally  detectable in upper and lower respiratory sp ecimens during the acute  phase of infection. The expected result is Negative. Fact Sheet for Patients:  StrictlyIdeas.no Fact Sheet for Healthcare Providers: BankingDealers.co.za This test is not yet approved or cleared by the [place]  FDA and has been authorized for detection and/or diagnosis of SARS-CoV-2 by FDA under an Emergency Use Authorization (EUA).  This EUA will remain in effect (meaning this test can be used) for the duration of the COVID-19 declaration under Section 564(b)(1) of the Act, 21 U.S.C. section 360bbb-3(b)(1), unless the authorization is terminated or revoked sooner. Performed at [facility], [place] [address], [place], [place] [address]   Respiratory Panel by PCR     Status: None   Collection Time: 03/09/19  7:21 AM  Result Value Ref Range Status   Adenovirus NOT DETECTED NOT DETECTED Final   Coronavirus 229E NOT DETECTED NOT DETECTED Final    Comment: (NOTE) The Coronavirus on the Respiratory Panel, DOES NOT test for the novel  Coronavirus (2019 nCoV)    Coronavirus HKU1 NOT DETECTED NOT DETECTED Final   Coronavirus NL63 NOT DETECTED NOT DETECTED Final   Coronavirus OC43 NOT DETECTED NOT DETECTED Final   Metapneumovirus NOT DETECTED NOT DETECTED Final   Rhinovirus / Enterovirus NOT DETECTED NOT DETECTED Final   Influenza A NOT DETECTED NOT DETECTED Final   Influenza B NOT DETECTED NOT DETECTED Final   Parainfluenza Virus 1 NOT DETECTED NOT DETECTED Final   Parainfluenza Virus 2 NOT DETECTED NOT DETECTED Final   Parainfluenza Virus 3 NOT DETECTED NOT DETECTED Final   Parainfluenza Virus 4 NOT DETECTED NOT DETECTED Final   Respiratory Syncytial Virus NOT DETECTED NOT DETECTED Final   Bordetella pertussis NOT DETECTED NOT DETECTED Final   Chlamydophila pneumoniae NOT DETECTED NOT DETECTED Final   Mycoplasma pneumoniae NOT DETECTED NOT DETECTED Final    Comment: Performed at [facility], [place]. [address], [place], [place] [address]  Culture, respiratory (tracheal aspirate)     Status: None (Preliminary result)   Collection Time: 03/09/19  9:56 AM  Result Value Ref Range Status   Specimen Description TRACHEAL ASPIRATE  Final   Special Requests NONE  Final   Gram Stain PENDING   Incomplete   Culture   Final    TOO YOUNG TO READ Performed at [facility], [place] [address] [place], [place] [address]    Report Status PENDING  Incomplete  Culture, blood (routine x 2)     Status: None (Preliminary result)   Collection Time: 03/09/19 10:10 AM  Result Value Ref Range Status   Specimen Description BLOOD LEFT HAND  Final   Special Requests   Final    BOTTLES DRAWN AEROBIC AND ANAEROBIC Blood Culture results  may not be optimal due to an inadequate volume of blood received in culture bottles   Culture   Final    NO GROWTH < 24 HOURS Performed at [facility] [address] [place], [place] [address]    Report Status PENDING  Incomplete  Culture, blood (routine x 2)     Status: None (Preliminary result)   Collection Time: 03/09/19 10:20 AM  Result Value Ref Range Status   Specimen Description BLOOD LEFT WRIST  Final   Special Requests   Final    BOTTLES DRAWN AEROBIC AND ANAEROBIC Blood Culture results may not be optimal due to an inadequate volume of blood received in culture bottles   Culture   Final    NO GROWTH < 24 HOURS Performed at [facility], [place] [address], [place], [place] [address]    Report Status PENDING  Incomplete  Urine culture     Status: None   Collection Time: 03/09/19 10:37 AM  Result Value Ref Range Status   Specimen Description URINE, RANDOM  Final   Special Requests NONE  Final   Culture   Final    NO GROWTH Performed at [facility], [address] [place], [place] [address]    Report Status 03/10/2019 FINAL  Final  MRSA PCR Screening     Status: None   Collection Time: 03/09/19 11:33 AM  Result Value Ref Range Status   MRSA by PCR NEGATIVE NEGATIVE Final    Comment:        The GeneXpert MRSA Assay (FDA approved for NASAL specimens only), is one component of a comprehensive MRSA colonization surveillance program. It is not intended to diagnose MRSA infection nor to guide or monitor treatment for MRSA  infections. Performed at [facility], [place] [address] [place], [place] [address]          Radiology Studies: Ct Chest W Contrast  Result Date: 03/09/2019 CLINICAL DATA:  Shortness of breath.  Pneumothorax suspected. EXAM: CT CHEST WITH CONTRAST TECHNIQUE: Multidetector CT imaging of the chest was performed during intravenous contrast administration. CONTRAST:  40mL OMNIPAQUE IOHEXOL 300 MG/ML  SOLN COMPARISON:  March 09, 2019 chest x-ray FINDINGS: Cardiovascular: Mild atherosclerotic changes in the nonaneurysmal aorta without dissection. The heart size is normal. The central pulmonary arteries are unremarkable. Mediastinum/Nodes: The NG tube terminates below the left hemidiaphragm. No pneumothorax. No adenopathy. Lungs/Pleura: The ETT terminates in good position. The trachea and mainstem bronchi are normal. Mild narrowing of the right bronchus intermedius as seen on axial image 77 and sagittal image 68 with no endobronchial lesion identified. The remainder of the airways are normal within visualize limits. No pneumothorax. Evaluation of the bases is limited due to respiratory motion. There is a nodule in the left apex measuring 5 by 5 by 3 mm with a mean diameter of 4 mm. No other nodules. No masses or infiltrates. Mild emphysematous changes in the apices. Upper Abdomen: There is a cyst identified in the liver. Upper abdomen otherwise unremarkable. Musculoskeletal: No chest wall abnormality. No acute or significant osseous findings. IMPRESSION: 1. No pneumothorax. 2. Smooth mild narrowing of the right bronchus intermedius without identified mass or endobronchial lesion. No associated wall thickening. This is a nonspecific finding. Recommend attention on short-term follow-up when the patient's symptoms have stabilized and a study may be performed with less respiratory motion. 3. There is a 4 mm nodule in the left apex which is nonspecific. No follow-up needed if patient  is low-risk. Non-contrast  chest CT can be considered in 12 months if patient is high-risk. This recommendation follows the consensus statement: Guidelines for Management of Incidental Pulmonary Nodules Detected on CT Images: From the Fleischner Society 2017; Radiology 2017; 284:228-243. 4. Mild emphysematous changes in the lung apices. 5. Mild atherosclerotic changes in the nonaneurysmal aorta. Aortic Atherosclerosis (ICD10-I70.0). Electronically Signed   By: Dorise Bullion III M.D   On: 03/09/2019 09:22   Dg Chest Port 1 View  Result Date: 03/10/2019 CLINICAL DATA:  Acute resp failure PPE: I donned gloves and surgical maskacute resp failure EXAM: PORTABLE CHEST 1 VIEW COMPARISON:  Radiograph 03/09/2019 FINDINGS: Endotracheal tube and NG tube unchanged. Normal cardiac silhouette. Lungs are clear. IMPRESSION: 1. Stable support apparatus. 2. Lungs are clear. Electronically Signed   By: Suzy Bouchard M.D.   On: 03/10/2019 06:56   Dg Chest Portable 1 View  Result Date: 03/09/2019 CLINICAL DATA:  63 year old male with respiratory failure, now intubated EXAM: PORTABLE CHEST 1 VIEW COMPARISON:  Prior chest x-ray 03/09/2019 at 7:14 a.m. FINDINGS: Interval intubation. The tip of the endotracheal tube is in good position 6 cm above the carina. A gastric tube has also been placed. The tip of the tube is not visualized but lies below the diaphragm, presumably within the stomach. No findings to suggest pneumothorax. No focal airspace opacity, pulmonary edema or pleural effusion. No acute osseous abnormality. IMPRESSION: 1. The tip of the newly placed endotracheal tube is 6 cm above the carina. 2. Gastric tube in place, the tip lies off the field of view, below the diaphragm and presumably within the stomach. 3. No pneumothorax identified. Previously noted linear opacity was likely artifactual. Electronically Signed   By: Jacqulynn Cadet M.D.   On: 03/09/2019 08:54   Dg Chest Port 1 View  Result Date: 03/09/2019 CLINICAL DATA:  Severe  SOB this morning, even with albuterol treatment.sob EXAM: PORTABLE CHEST 1 VIEW COMPARISON:  10/13/2018 FINDINGS: Normal cardiac silhouette. Lungs are hyperinflated. Thin linear edge extending vertically in the lateral aspect of the RIGHT upper lobe terminates at the horizontal fissure. Concern for partial collapse of the RIGHT upper lobe. Potential pleura edge 23 mm from the chest wall. Otherwise lungs are clear. IMPRESSION: Concern for atypical RIGHT upper lobe pneumothorax with a vertical pleural edge versus externally artifact. Volume of potential pneumothorax is small (approximately 10% hemithorax volume). Findings conveyed toYellvertonon 03/09/2019  at08:02. Electronically Signed   By: Suzy Bouchard M.D.   On: 03/09/2019 08:13        Scheduled Meds:  amLODipine  5 mg Oral Daily   budesonide (PULMICORT) nebulizer solution  0.5 mg Nebulization BID   enoxaparin (LOVENOX) injection  40 mg Subcutaneous Daily   ipratropium-albuterol  3 mL Nebulization Q6H   mouth rinse  15 mL Mouth Rinse BID   methylPREDNISolone (SOLU-MEDROL) injection  60 mg Intravenous Q6H   pantoprazole sodium  40 mg Per Tube Daily   Continuous Infusions:    LOS: 1 day    Dana Allan, MD  [facility] Pager #: [phone] 7PM-7AM contact night coverage as above

## 2019-03-10 NOTE — Progress Notes (Signed)
Report called to 2W. Patient in no distress on room air. A&O x4. No isolation orders per lab results and MD order.

## 2019-03-10 NOTE — Progress Notes (Signed)
Wasted 90 mL of propofol Witness Probation officer

## 2019-03-10 NOTE — Procedures (Signed)
Extubation Procedure Note  Patient Details:   Name: [name] DOB: Apr 04, 1956 MRN: [id number]   Airway Documentation:    Vent end date: 03/10/19 Vent end time: 0845   Evaluation  O2 sats: stable throughout Complications: No apparent complications Patient did tolerate procedure well. Bilateral Breath Sounds: Rhonchi   Yes  RT extubated patient to 2L nasal canula. Patient oriented to time and place with good productive cough.  Dimple Nanas 03/10/2019, 8:50 AM

## 2019-03-10 NOTE — Progress Notes (Signed)
PROGRESS NOTE    [name]  IPJ:[id number] DOB: 08-31-1956 DOA: 03/09/2019 PCP: [place] Bing, DO  Outpatient Specialists:   Brief Narrative: Patient is a 63 year old African-American male with past medical history significant for asthma, COPD, tobacco use disorder and hypertension.  Apparently, patient has not been very compliant.  Patient reported 2 prior intubations.  Patient missed filling his prescriptions for inhalers 2 days prior to presentation.  Patient presented with respiratory failure.  Patient was intubated and admitted to ICU overnight.  Patient has just been extubated, and is currently on room air.  Patient be transferred to telemetry floor for further assessment and management.   Assessment & Plan:   Active Problems:   COPD exacerbation ([place])  Acute respiratory failure/exacerbation of combined COPD and asthma: Patient has just been extubated, and is on room air. Continue nebulizer treatment Continue IV steroids Need to comply with medication discussed extensively with the patient. Further management will depend on hospital course.  Tobacco use disorder: Counseled patient to quit tobacco use.  Noncompliance with medication: Counseled patient.  Hypertension: Patient blood pressure was initially elevated on presentation, but likely due to respiratory distress. Currently, blood pressure is optimized. Continue to monitor closely.  CKD 3: Likely at baseline. Continue to monitor.   DVT prophylaxis: Subcutaneous Lovenox Code Status: Full code Family Communication:  Disposition Plan: Home eventually   Consultants:   Pulmonary/critical care  Procedures:   Intubation and extubation  Antimicrobials:   None   Subjective: No new complaints Shortness of breath has improved significantly No chest pain No fever or chills  Objective: Vitals:   03/10/19 0730 03/10/19 0741 03/10/19 0800 03/10/19 0830  BP: 132/69  (!) 143/86 (!) 153/88  Pulse:  91  (!) 102 100  Resp: (!) 21  18 16   Temp:      TempSrc:      SpO2: 99% 98% 97% 99%  Weight:      Height:        Intake/Output Summary (Last 24 hours) at 03/10/2019 0932 Last data filed at 03/10/2019 0851 Gross per 24 hour  Intake 1191.08 ml  Output 2525 ml  Net -1333.92 ml   Filed Weights   03/09/19 0820  Weight: 56.2 kg    Examination:  General exam: Appears calm and comfortable  Respiratory system: Decreased air entry.  Mild wheeze Cardiovascular system: S1 & S2 heard. Gastrointestinal system: Abdomen is nondistended, soft and nontender. No organomegaly or masses felt. Normal bowel sounds heard. Central nervous system: Alert and oriented.  Patient moves all extremities.   Extremities: No leg edema  Data Reviewed: I have personally reviewed following labs and imaging studies  CBC: Recent Labs  Lab 03/09/19 0718 03/09/19 1010 03/10/19 0318 03/10/19 0415  WBC 10.5  --   --  11.3*  NEUTROABS 4.1  --   --   --   HGB 14.5 13.9 13.6 12.8*  HCT 45.2 41.0 40.0 40.5  MCV 102.5*  --   --  101.0*  PLT 247  --   --  734   Basic Metabolic Panel: Recent Labs  Lab 03/09/19 0718 03/09/19 1010 03/09/19 1024 03/10/19 0318 03/10/19 0415  NA 143 138  --  138 139  K 4.7 4.3  --  4.5 4.5  CL 104  --   --   --  104  CO2 28  --   --   --  22  GLUCOSE 97  --   --   --  147*  BUN 18  --   --   --  20  CREATININE 1.22  --  1.23  --  1.38*  CALCIUM 9.7  --   --   --  9.5  MG  --   --   --   --  2.2  PHOS  --   --   --   --  3.3   GFR: Estimated Creatinine Clearance: 43 mL/min (A) (by C-G formula based on SCr of 1.38 mg/dL (H)). Liver Function Tests: Recent Labs  Lab 03/09/19 0718  AST 32  ALT 25  ALKPHOS 59  BILITOT 0.8  PROT 7.1  ALBUMIN 4.1   No results for input(s): LIPASE, AMYLASE in the last 168 hours. No results for input(s): AMMONIA in the last 168 hours. Coagulation Profile: No results for input(s): INR, PROTIME in the last 168 hours. Cardiac  Enzymes: No results for input(s): CKTOTAL, CKMB, CKMBINDEX, TROPONINI in the last 168 hours. BNP (last 3 results) No results for input(s): PROBNP in the last 8760 hours. HbA1C: No results for input(s): HGBA1C in the last 72 hours. CBG: Recent Labs  Lab 03/09/19 1555 03/09/19 1944 03/10/19 0009 03/10/19 0413 03/10/19 0830  GLUCAP 135* 131* 126* 135* 121*   Lipid Profile: Recent Labs    03/09/19 0720  TRIG 61   Thyroid Function Tests: No results for input(s): TSH, T4TOTAL, FREET4, T3FREE, THYROIDAB in the last 72 hours. Anemia Panel: Recent Labs    03/09/19 0718  FERRITIN 96   Urine analysis:    Component Value Date/Time   COLORURINE STRAW (A) 03/09/2019 1037   APPEARANCEUR CLEAR 03/09/2019 1037   LABSPEC 1.012 03/09/2019 1037   PHURINE 5.0 03/09/2019 1037   GLUCOSEU NEGATIVE 03/09/2019 1037   HGBUR NEGATIVE 03/09/2019 1037   BILIRUBINUR NEGATIVE 03/09/2019 1037   KETONESUR NEGATIVE 03/09/2019 1037   PROTEINUR NEGATIVE 03/09/2019 1037   NITRITE NEGATIVE 03/09/2019 1037   LEUKOCYTESUR NEGATIVE 03/09/2019 1037   Sepsis Labs: @LABRCNTIP (procalcitonin:4,lacticidven:4)  ) Recent Results (from the past 240 hour(s))  SARS Coronavirus 2 [facility] order, Performed in [place] hospital lab)     Status: None   Collection Time: 03/09/19  7:21 AM  Result Value Ref Range Status   SARS Coronavirus 2 NEGATIVE NEGATIVE Final    Comment: (NOTE) If result is NEGATIVE SARS-CoV-2 target nucleic acids are NOT DETECTED. The SARS-CoV-2 RNA is generally detectable in upper and lower  respiratory specimens during the acute phase of infection. The lowest  concentration of SARS-CoV-2 viral copies this assay can detect is 250  copies / mL. A negative result does not preclude SARS-CoV-2 infection  and should not be used as the sole basis for treatment or other  patient management decisions.  A negative result may occur with  improper specimen collection / handling, submission of  specimen other  than nasopharyngeal swab, presence of viral mutation(s) within the  areas targeted by this assay, and inadequate number of viral copies  (<250 copies / mL). A negative result must be combined with clinical  observations, patient history, and epidemiological information. If result is POSITIVE SARS-CoV-2 target nucleic acids are DETECTED. The SARS-CoV-2 RNA is generally detectable in upper and lower  respiratory specimens dur ing the acute phase of infection.  Positive  results are indicative of active infection with SARS-CoV-2.  Clinical  correlation with patient history and other diagnostic information is  necessary to determine patient infection status.  Positive results do  not rule out bacterial infection or co-infection with other  viruses. If result is PRESUMPTIVE POSTIVE SARS-CoV-2 nucleic acids MAY BE PRESENT.   A presumptive positive result was obtained on the submitted specimen  and confirmed on repeat testing.  While 2019 novel coronavirus  (SARS-CoV-2) nucleic acids may be present in the submitted sample  additional confirmatory testing may be necessary for epidemiological  and / or clinical management purposes  to differentiate between  SARS-CoV-2 and other Sarbecovirus currently known to infect humans.  If clinically indicated additional testing with an alternate test  methodology [phone]) is advised. The SARS-CoV-2 RNA is generally  detectable in upper and lower respiratory sp ecimens during the acute  phase of infection. The expected result is Negative. Fact Sheet for Patients:  StrictlyIdeas.no Fact Sheet for Healthcare Providers: BankingDealers.co.za This test is not yet approved or cleared by the [place] FDA and has been authorized for detection and/or diagnosis of SARS-CoV-2 by FDA under an Emergency Use Authorization (EUA).  This EUA will remain in effect (meaning this test can be used) for the  duration of the COVID-19 declaration under Section 564(b)(1) of the Act, 21 U.S.C. section 360bbb-3(b)(1), unless the authorization is terminated or revoked sooner. Performed at [facility], [place] [address] [place], [place] [address]   Respiratory Panel by PCR     Status: None   Collection Time: 03/09/19  7:21 AM  Result Value Ref Range Status   Adenovirus NOT DETECTED NOT DETECTED Final   Coronavirus 229E NOT DETECTED NOT DETECTED Final    Comment: (NOTE) The Coronavirus on the Respiratory Panel, DOES NOT test for the novel  Coronavirus (2019 nCoV)    Coronavirus HKU1 NOT DETECTED NOT DETECTED Final   Coronavirus NL63 NOT DETECTED NOT DETECTED Final   Coronavirus OC43 NOT DETECTED NOT DETECTED Final   Metapneumovirus NOT DETECTED NOT DETECTED Final   Rhinovirus / Enterovirus NOT DETECTED NOT DETECTED Final   Influenza A NOT DETECTED NOT DETECTED Final   Influenza B NOT DETECTED NOT DETECTED Final   Parainfluenza Virus 1 NOT DETECTED NOT DETECTED Final   Parainfluenza Virus 2 NOT DETECTED NOT DETECTED Final   Parainfluenza Virus 3 NOT DETECTED NOT DETECTED Final   Parainfluenza Virus 4 NOT DETECTED NOT DETECTED Final   Respiratory Syncytial Virus NOT DETECTED NOT DETECTED Final   Bordetella pertussis NOT DETECTED NOT DETECTED Final   Chlamydophila pneumoniae NOT DETECTED NOT DETECTED Final   Mycoplasma pneumoniae NOT DETECTED NOT DETECTED Final    Comment: Performed at [facility], [place]. [address], [place], [place] [address]  MRSA PCR Screening     Status: None   Collection Time: 03/09/19 11:33 AM  Result Value Ref Range Status   MRSA by PCR NEGATIVE NEGATIVE Final    Comment:        The GeneXpert MRSA Assay (FDA approved for NASAL specimens only), is one component of a comprehensive MRSA colonization surveillance program. It is not intended to diagnose MRSA infection nor to guide or monitor treatment for MRSA infections. Performed at [place], [place] [address] [place], [place] [address]          Radiology Studies: Ct Chest W Contrast  Result Date: 03/09/2019 CLINICAL DATA:  Shortness of breath.  Pneumothorax suspected. EXAM: CT CHEST WITH CONTRAST TECHNIQUE: Multidetector CT imaging of the chest was performed during intravenous contrast administration. CONTRAST:  85mL OMNIPAQUE IOHEXOL 300 MG/ML  SOLN COMPARISON:  March 09, 2019 chest x-ray FINDINGS: Cardiovascular: Mild atherosclerotic changes in the nonaneurysmal aorta without dissection.  The heart size is normal. The central pulmonary arteries are unremarkable. Mediastinum/Nodes: The NG tube terminates below the left hemidiaphragm. No pneumothorax. No adenopathy. Lungs/Pleura: The ETT terminates in good position. The trachea and mainstem bronchi are normal. Mild narrowing of the right bronchus intermedius as seen on axial image 77 and sagittal image 68 with no endobronchial lesion identified. The remainder of the airways are normal within visualize limits. No pneumothorax. Evaluation of the bases is limited due to respiratory motion. There is a nodule in the left apex measuring 5 by 5 by 3 mm with a mean diameter of 4 mm. No other nodules. No masses or infiltrates. Mild emphysematous changes in the apices. Upper Abdomen: There is a cyst identified in the liver. Upper abdomen otherwise unremarkable. Musculoskeletal: No chest wall abnormality. No acute or significant osseous findings. IMPRESSION: 1. No pneumothorax. 2. Smooth mild narrowing of the right bronchus intermedius without identified mass or endobronchial lesion. No associated wall thickening. This is a nonspecific finding. Recommend attention on short-term follow-up when the patient's symptoms have stabilized and a study may be performed with less respiratory motion. 3. There is a 4 mm nodule in the left apex which is nonspecific. No follow-up needed if patient is low-risk. Non-contrast chest CT can be considered in 12 months if  patient is high-risk. This recommendation follows the consensus statement: Guidelines for Management of Incidental Pulmonary Nodules Detected on CT Images: From the Fleischner Society 2017; Radiology 2017; 284:228-243. 4. Mild emphysematous changes in the lung apices. 5. Mild atherosclerotic changes in the nonaneurysmal aorta. Aortic Atherosclerosis (ICD10-I70.0). Electronically Signed   By: Dorise Bullion III M.D   On: 03/09/2019 09:22   Dg Chest Port 1 View  Result Date: 03/10/2019 CLINICAL DATA:  Acute resp failure PPE: I donned gloves and surgical maskacute resp failure EXAM: PORTABLE CHEST 1 VIEW COMPARISON:  Radiograph 03/09/2019 FINDINGS: Endotracheal tube and NG tube unchanged. Normal cardiac silhouette. Lungs are clear. IMPRESSION: 1. Stable support apparatus. 2. Lungs are clear. Electronically Signed   By: Suzy Bouchard M.D.   On: 03/10/2019 06:56   Dg Chest Portable 1 View  Result Date: 03/09/2019 CLINICAL DATA:  63 year old male with respiratory failure, now intubated EXAM: PORTABLE CHEST 1 VIEW COMPARISON:  Prior chest x-ray 03/09/2019 at 7:14 a.m. FINDINGS: Interval intubation. The tip of the endotracheal tube is in good position 6 cm above the carina. A gastric tube has also been placed. The tip of the tube is not visualized but lies below the diaphragm, presumably within the stomach. No findings to suggest pneumothorax. No focal airspace opacity, pulmonary edema or pleural effusion. No acute osseous abnormality. IMPRESSION: 1. The tip of the newly placed endotracheal tube is 6 cm above the carina. 2. Gastric tube in place, the tip lies off the field of view, below the diaphragm and presumably within the stomach. 3. No pneumothorax identified. Previously noted linear opacity was likely artifactual. Electronically Signed   By: Jacqulynn Cadet M.D.   On: 03/09/2019 08:54   Dg Chest Port 1 View  Result Date: 03/09/2019 CLINICAL DATA:  Severe SOB this morning, even with albuterol  treatment.sob EXAM: PORTABLE CHEST 1 VIEW COMPARISON:  10/13/2018 FINDINGS: Normal cardiac silhouette. Lungs are hyperinflated. Thin linear edge extending vertically in the lateral aspect of the RIGHT upper lobe terminates at the horizontal fissure. Concern for partial collapse of the RIGHT upper lobe. Potential pleura edge 23 mm from the chest wall. Otherwise lungs are clear. IMPRESSION: Concern for atypical RIGHT upper lobe pneumothorax  with a vertical pleural edge versus externally artifact. Volume of potential pneumothorax is small (approximately 10% hemithorax volume). Findings conveyed toYellvertonon 03/09/2019  at08:02. Electronically Signed   By: Suzy Bouchard M.D.   On: 03/09/2019 08:13        Scheduled Meds:  amLODipine  5 mg Oral Daily   budesonide (PULMICORT) nebulizer solution  0.5 mg Nebulization BID   enoxaparin (LOVENOX) injection  40 mg Subcutaneous Daily   feeding supplement (PRO-STAT SUGAR FREE 64)  60 mL Per Tube Daily   feeding supplement (VITAL HIGH PROTEIN)  1,000 mL Per Tube Q24H   free water  100 mL Per Tube Q6H   ipratropium-albuterol  3 mL Nebulization Q6H   mouth rinse  15 mL Mouth Rinse BID   methylPREDNISolone (SOLU-MEDROL) injection  60 mg Intravenous Q6H   pantoprazole sodium  40 mg Per Tube Daily   Continuous Infusions:  propofol (DIPRIVAN) infusion Stopped (03/10/19 0834)     LOS: 1 day    Dana Allan, MD  [facility] Pager #: [phone] 7PM-7AM contact night coverage as above

## 2019-03-11 DIAGNOSIS — F17200 Nicotine dependence, unspecified, uncomplicated: Secondary | ICD-10-CM

## 2019-03-11 DIAGNOSIS — I10 Essential (primary) hypertension: Secondary | ICD-10-CM

## 2019-03-11 LAB — GLUCOSE, CAPILLARY
Glucose-Capillary: 140 mg/dL — ABNORMAL HIGH (ref 70–99)
Glucose-Capillary: 118 mg/dL — ABNORMAL HIGH (ref 70–99)

## 2019-03-11 MED ORDER — ALBUTEROL SULFATE HFA 108 (90 BASE) MCG/ACT IN AERS: 2.0000 | INHALATION_SPRAY | Freq: Four times a day (QID) | RESPIRATORY_TRACT | 2 refills | Status: DC | PRN

## 2019-03-11 MED ORDER — IPRATROPIUM-ALBUTEROL 0.5-2.5 (3) MG/3ML IN SOLN: 3.0000 mL | Freq: Four times a day (QID) | RESPIRATORY_TRACT | 0 refills | Status: DC | PRN

## 2019-03-11 MED ORDER — PREDNISONE 20 MG PO TABS: 20.0000 mg | ORAL_TABLET | Freq: Every day | ORAL | 0 refills | Status: AC

## 2019-03-11 MED ORDER — FLUTICASONE-UMECLIDIN-VILANT 100-62.5-25 MCG/INH IN AEPB: 1.0000 "application " | INHALATION_SPRAY | Freq: Every day | RESPIRATORY_TRACT | 2 refills | Status: DC

## 2019-03-11 MED ORDER — AMLODIPINE BESYLATE 5 MG PO TABS: 10.0000 mg | ORAL_TABLET | Freq: Every day | ORAL | 3 refills | Status: DC

## 2019-03-11 NOTE — Discharge Summary (Signed)
Physician Discharge Summary  [name] NWG:[id number] DOB: 1956/02/02 DOA: 03/09/2019  PCP: Palomas Bing, DO  Admit date: 03/09/2019 Discharge date: 03/11/2019  Admitted From: Home Disposition: Home  Recommendations for Outpatient Follow-up:  1. Follow up with PCP in 1 week. 2. Patient will be discharged on oral prednisone taper over the next 12 days. 3. Patient needs follow-up chest CT with contrast in 12 months for his right upper lobe lung nodule seen on chest CT done this admission..  Home Health: None Equipment/Devices: None  Discharge Condition: Fair CODE STATUS: Full code Diet recommendation: Low-sodium    Discharge Diagnoses:  Principal Problem:   Acute respiratory failure with hypoxia ([place])  Active Problems:   Tobacco use disorder   COPD exacerbation ([place])   Uncontrolled hypertension  Brief narrative/HPI Please refer to admission H&P for details, in brief, 63 year old male with history of asthma, COPD, ongoing tobacco use and uncontrolled hypertension with noncompliance with medications, 2 prior intubations.  He missed feeling his inhaler prescriptions 2 days prior to admission and presented with increasing shortness of breath and wheezing.  Patient was found to be in hypoxic respiratory failure and was intubated and admitted to the ICU.  He was extubated the next day and transferred to telemetry.  Hospital course Principal problem  Acute respiratory failure with hypoxia (HCC) COPD with acute exacerbation ([place]) Secondary to medication nonadherence and ongoing tobacco use.  Intubated for 24 hours.  Now stable on room air. Patient was tested negative for novel coronavirus. Tolerating diet and ambulating without support. We will discharge him on oral prednisone taper over the next 12 days.  Switched PRN albuterol neb to DuoNeb, resume Spiriva and Trelegy (reports he has to pick them up from his pharmacy).  counseled strongly on smoking  cessation.  Follow-up with PCP in 1 week.  Uncontrolled hypertension will increase amlodipine dose to 10 mg daily.  Counseled on medication adherence.  Right lung nodule Chest CT shows 4 mm nodule in the right lung apex.  Given his smoking history needs follow-up chest CT in 12 months.   Disposition: Home Consult: PCCM Procedure: Intubation   Discharge Instructions   Allergies as of 03/11/2019      Reactions   Lisinopril Swelling      Medication List    STOP taking these medications   albuterol (2.5 MG/3ML) 0.083% nebulizer solution Commonly known as:  PROVENTIL Replaced by:  albuterol 108 (90 Base) MCG/ACT inhaler   aspirin 81 MG EC tablet   doxycycline 100 MG tablet Commonly known as:  VIBRA-TABS   fexofenadine 180 MG tablet Commonly known as:  ALLEGRA   fluticasone 50 MCG/ACT nasal spray Commonly known as:  FLONASE   naproxen 500 MG tablet Commonly known as:  NAPROSYN     TAKE these medications   acetaminophen 500 MG tablet Commonly known as:  TYLENOL Take 1,000 mg by mouth as needed for mild pain.   albuterol 108 (90 Base) MCG/ACT inhaler Commonly known as:  VENTOLIN HFA Inhale 2 puffs into the lungs every 6 (six) hours as needed for wheezing or shortness of breath. Replaces:  albuterol (2.5 MG/3ML) 0.083% nebulizer solution   Allergy Relief 10 MG tablet Generic drug:  loratadine Take 10 mg by mouth daily as needed for allergies.   amLODipine 5 MG tablet Commonly known as:  NORVASC Take 2 tablets (10 mg total) by mouth daily. What changed:  how much to take   Fluticasone-Umeclidin-Vilant 100-62.5-25 MCG/INH Aepb Commonly known as:  Trelegy Ellipta  Inhale 1 application into the lungs daily.   ipratropium-albuterol 0.5-2.5 (3) MG/3ML Soln Commonly known as:  DUONEB Take 3 mLs by nebulization every 6 (six) hours as needed.   MUCUS RELIEF ADULT PO Take 1 tablet by mouth as needed.   predniSONE 20 MG tablet Commonly known as:  DELTASONE Take  1 tablet (20 mg total) by mouth daily with breakfast for 12 days. What changed:    medication strength  how much to take   Spiriva HandiHaler 18 MCG inhalation capsule Generic drug:  tiotropium INHALE 1 CAPSULE VIA HANDIHALER ONCE DAILY AT THE SAME TIME EVERY DAY What changed:  See the new instructions.      Follow-up Information    [name], DO. Schedule an appointment as soon as possible for a visit in 1 week(s).   Specialty:  Family Medicine Contact information: [address] [place] [address] [phone]          Allergies  Allergen Reactions  . Lisinopril Swelling      Procedures/Studies: Ct Chest W Contrast  Result Date: 03/09/2019 CLINICAL DATA:  Shortness of breath.  Pneumothorax suspected. EXAM: CT CHEST WITH CONTRAST TECHNIQUE: Multidetector CT imaging of the chest was performed during intravenous contrast administration. CONTRAST:  73mL OMNIPAQUE IOHEXOL 300 MG/ML  SOLN COMPARISON:  March 09, 2019 chest x-ray FINDINGS: Cardiovascular: Mild atherosclerotic changes in the nonaneurysmal aorta without dissection. The heart size is normal. The central pulmonary arteries are unremarkable. Mediastinum/Nodes: The NG tube terminates below the left hemidiaphragm. No pneumothorax. No adenopathy. Lungs/Pleura: The ETT terminates in good position. The trachea and mainstem bronchi are normal. Mild narrowing of the right bronchus intermedius as seen on axial image 77 and sagittal image 68 with no endobronchial lesion identified. The remainder of the airways are normal within visualize limits. No pneumothorax. Evaluation of the bases is limited due to respiratory motion. There is a nodule in the left apex measuring 5 by 5 by 3 mm with a mean diameter of 4 mm. No other nodules. No masses or infiltrates. Mild emphysematous changes in the apices. Upper Abdomen: There is a cyst identified in the liver. Upper abdomen otherwise unremarkable. Musculoskeletal: No chest wall  abnormality. No acute or significant osseous findings. IMPRESSION: 1. No pneumothorax. 2. Smooth mild narrowing of the right bronchus intermedius without identified mass or endobronchial lesion. No associated wall thickening. This is a nonspecific finding. Recommend attention on short-term follow-up when the patient's symptoms have stabilized and a study may be performed with less respiratory motion. 3. There is a 4 mm nodule in the left apex which is nonspecific. No follow-up needed if patient is low-risk. Non-contrast chest CT can be considered in 12 months if patient is high-risk. This recommendation follows the consensus statement: Guidelines for Management of Incidental Pulmonary Nodules Detected on CT Images: From the Fleischner Society 2017; Radiology 2017; 284:228-243. 4. Mild emphysematous changes in the lung apices. 5. Mild atherosclerotic changes in the nonaneurysmal aorta. Aortic Atherosclerosis (ICD10-I70.0). Electronically Signed   By: Dorise Bullion III M.D   On: 03/09/2019 09:22   Dg Chest Port 1 View  Result Date: 03/10/2019 CLINICAL DATA:  Acute resp failure PPE: I donned gloves and surgical maskacute resp failure EXAM: PORTABLE CHEST 1 VIEW COMPARISON:  Radiograph 03/09/2019 FINDINGS: Endotracheal tube and NG tube unchanged. Normal cardiac silhouette. Lungs are clear. IMPRESSION: 1. Stable support apparatus. 2. Lungs are clear. Electronically Signed   By: Suzy Bouchard M.D.   On: 03/10/2019 06:56   Dg  Chest Portable 1 View  Result Date: 03/09/2019 CLINICAL DATA:  63 year old male with respiratory failure, now intubated EXAM: PORTABLE CHEST 1 VIEW COMPARISON:  Prior chest x-ray 03/09/2019 at 7:14 a.m. FINDINGS: Interval intubation. The tip of the endotracheal tube is in good position 6 cm above the carina. A gastric tube has also been placed. The tip of the tube is not visualized but lies below the diaphragm, presumably within the stomach. No findings to suggest pneumothorax. No focal  airspace opacity, pulmonary edema or pleural effusion. No acute osseous abnormality. IMPRESSION: 1. The tip of the newly placed endotracheal tube is 6 cm above the carina. 2. Gastric tube in place, the tip lies off the field of view, below the diaphragm and presumably within the stomach. 3. No pneumothorax identified. Previously noted linear opacity was likely artifactual. Electronically Signed   By: Jacqulynn Cadet M.D.   On: 03/09/2019 08:54   Dg Chest Port 1 View  Result Date: 03/09/2019 CLINICAL DATA:  Severe SOB this morning, even with albuterol treatment.sob EXAM: PORTABLE CHEST 1 VIEW COMPARISON:  10/13/2018 FINDINGS: Normal cardiac silhouette. Lungs are hyperinflated. Thin linear edge extending vertically in the lateral aspect of the RIGHT upper lobe terminates at the horizontal fissure. Concern for partial collapse of the RIGHT upper lobe. Potential pleura edge 23 mm from the chest wall. Otherwise lungs are clear. IMPRESSION: Concern for atypical RIGHT upper lobe pneumothorax with a vertical pleural edge versus externally artifact. Volume of potential pneumothorax is small (approximately 10% hemithorax volume). Findings conveyed toYellvertonon 03/09/2019  at08:02. Electronically Signed   By: Suzy Bouchard M.D.   On: 03/09/2019 08:13       Subjective: Reports breathing to be normal.  Stable on room air.  Discharge Exam: Vitals:   03/11/19 0856 03/11/19 0859  BP:    Pulse:    Resp:    Temp:    SpO2: 98% 98%   Vitals:   03/10/19 2352 03/11/19 0733 03/11/19 0856 03/11/19 0859  BP: (!) 165/77 (!) 160/91    Pulse: 80 72    Resp: 20     Temp: 98.5 F (36.9 C) 98.2 F (36.8 C)    TempSrc: Oral Oral    SpO2: 99% 95% 98% 98%  Weight:      Height:        General: Not in distress HEENT: Moist mucosa, supple neck Chest: Clear bilaterally CVS: Normal S1-S2, no murmurs GI: Soft, nondistended, nontender Musculoskeletal: Warm, no edema    The results of significant  diagnostics from this hospitalization (including imaging, microbiology, ancillary and laboratory) are listed below for reference.     Microbiology: Recent Results (from the past 240 hour(s))  SARS Coronavirus 2 [facility] order, Performed in [facility] hospital lab)     Status: None   Collection Time: 03/09/19  7:21 AM  Result Value Ref Range Status   SARS Coronavirus 2 NEGATIVE NEGATIVE Final    Comment: (NOTE) If result is NEGATIVE SARS-CoV-2 target nucleic acids are NOT DETECTED. The SARS-CoV-2 RNA is generally detectable in upper and lower  respiratory specimens during the acute phase of infection. The lowest  concentration of SARS-CoV-2 viral copies this assay can detect is 250  copies / mL. A negative result does not preclude SARS-CoV-2 infection  and should not be used as the sole basis for treatment or other  patient management decisions.  A negative result may occur with  improper specimen collection / handling, submission of specimen other  than nasopharyngeal swab, presence of  viral mutation(s) within the  areas targeted by this assay, and inadequate number of viral copies  (<250 copies / mL). A negative result must be combined with clinical  observations, patient history, and epidemiological information. If result is POSITIVE SARS-CoV-2 target nucleic acids are DETECTED. The SARS-CoV-2 RNA is generally detectable in upper and lower  respiratory specimens dur ing the acute phase of infection.  Positive  results are indicative of active infection with SARS-CoV-2.  Clinical  correlation with patient history and other diagnostic information is  necessary to determine patient infection status.  Positive results do  not rule out bacterial infection or co-infection with other viruses. If result is PRESUMPTIVE POSTIVE SARS-CoV-2 nucleic acids MAY BE PRESENT.   A presumptive positive result was obtained on the submitted specimen  and confirmed on repeat testing.  While 2019  novel coronavirus  (SARS-CoV-2) nucleic acids may be present in the submitted sample  additional confirmatory testing may be necessary for epidemiological  and / or clinical management purposes  to differentiate between  SARS-CoV-2 and other Sarbecovirus currently known to infect humans.  If clinically indicated additional testing with an alternate test  methodology [phone]) is advised. The SARS-CoV-2 RNA is generally  detectable in upper and lower respiratory sp ecimens during the acute  phase of infection. The expected result is Negative. Fact Sheet for Patients:  StrictlyIdeas.no Fact Sheet for Healthcare Providers: BankingDealers.co.za This test is not yet approved or cleared by the [place] FDA and has been authorized for detection and/or diagnosis of SARS-CoV-2 by FDA under an Emergency Use Authorization (EUA).  This EUA will remain in effect (meaning this test can be used) for the duration of the COVID-19 declaration under Section 564(b)(1) of the Act, 21 U.S.C. section 360bbb-3(b)(1), unless the authorization is terminated or revoked sooner. Performed at [facility], [place] [address] [place], [place] [address]   Respiratory Panel by PCR     Status: None   Collection Time: 03/09/19  7:21 AM  Result Value Ref Range Status   Adenovirus NOT DETECTED NOT DETECTED Final   Coronavirus 229E NOT DETECTED NOT DETECTED Final    Comment: (NOTE) The Coronavirus on the Respiratory Panel, DOES NOT test for the novel  Coronavirus (2019 nCoV)    Coronavirus HKU1 NOT DETECTED NOT DETECTED Final   Coronavirus NL63 NOT DETECTED NOT DETECTED Final   Coronavirus OC43 NOT DETECTED NOT DETECTED Final   Metapneumovirus NOT DETECTED NOT DETECTED Final   Rhinovirus / Enterovirus NOT DETECTED NOT DETECTED Final   Influenza A NOT DETECTED NOT DETECTED Final   Influenza B NOT DETECTED NOT DETECTED Final   Parainfluenza Virus 1 NOT DETECTED  NOT DETECTED Final   Parainfluenza Virus 2 NOT DETECTED NOT DETECTED Final   Parainfluenza Virus 3 NOT DETECTED NOT DETECTED Final   Parainfluenza Virus 4 NOT DETECTED NOT DETECTED Final   Respiratory Syncytial Virus NOT DETECTED NOT DETECTED Final   Bordetella pertussis NOT DETECTED NOT DETECTED Final   Chlamydophila pneumoniae NOT DETECTED NOT DETECTED Final   Mycoplasma pneumoniae NOT DETECTED NOT DETECTED Final    Comment: Performed at [facility], [place]. [address], [place], [place] [address]  Culture, respiratory (tracheal aspirate)     Status: None (Preliminary result)   Collection Time: 03/09/19  9:56 AM  Result Value Ref Range Status   Specimen Description TRACHEAL ASPIRATE  Final   Special Requests NONE  Final   Gram Stain   Final    ABUNDANT WBC PRESENT,  PREDOMINANTLY PMN RARE GRAM POSITIVE COCCI    Culture   Final    CULTURE REINCUBATED FOR BETTER GROWTH Performed at [facility], [place] [address] [place], [place] [address]    Report Status PENDING  Incomplete  Culture, blood (routine x 2)     Status: None (Preliminary result)   Collection Time: 03/09/19 10:10 AM  Result Value Ref Range Status   Specimen Description BLOOD LEFT HAND  Final   Special Requests   Final    BOTTLES DRAWN AEROBIC AND ANAEROBIC Blood Culture results may not be optimal due to an inadequate volume of blood received in culture bottles   Culture   Final    NO GROWTH 1 DAY Performed at [facility], [place] [address] [place], [place] [address]    Report Status PENDING  Incomplete  Culture, blood (routine x 2)     Status: None (Preliminary result)   Collection Time: 03/09/19 10:20 AM  Result Value Ref Range Status   Specimen Description BLOOD LEFT WRIST  Final   Special Requests   Final    BOTTLES DRAWN AEROBIC AND ANAEROBIC Blood Culture results may not be optimal due to an inadequate volume of blood received in culture bottles   Culture   Final    NO GROWTH 1 DAY Performed at  [facility], [place] [address], [place], [place] [address]    Report Status PENDING  Incomplete  Urine culture     Status: None   Collection Time: 03/09/19 10:37 AM  Result Value Ref Range Status   Specimen Description URINE, RANDOM  Final   Special Requests NONE  Final   Culture   Final    NO GROWTH Performed at [facility], [address], [place], [place] [address]    Report Status 03/10/2019 FINAL  Final  MRSA PCR Screening     Status: None   Collection Time: 03/09/19 11:33 AM  Result Value Ref Range Status   MRSA by PCR NEGATIVE NEGATIVE Final    Comment:        The GeneXpert MRSA Assay (FDA approved for NASAL specimens only), is one component of a comprehensive MRSA colonization surveillance program. It is not intended to diagnose MRSA infection nor to guide or monitor treatment for MRSA infections. Performed at [facility], [place] [address], [place], [place] [address]      Labs: BNP (last 3 results) No results for input(s): BNP in the last 8760 hours. Basic Metabolic Panel: Recent Labs  Lab 03/09/19 0718 03/09/19 1010 03/09/19 1024 03/10/19 0318 03/10/19 0415  NA 143 138  --  138 139  K 4.7 4.3  --  4.5 4.5  CL 104  --   --   --  104  CO2 28  --   --   --  22  GLUCOSE 97  --   --   --  147*  BUN 18  --   --   --  20  CREATININE 1.22  --  1.23  --  1.38*  CALCIUM 9.7  --   --   --  9.5  MG  --   --   --   --  2.2  PHOS  --   --   --   --  3.3   Liver Function Tests: Recent Labs  Lab 03/09/19 0718  AST 32  ALT 25  ALKPHOS 59  BILITOT 0.8  PROT 7.1  ALBUMIN  4.1   No results for input(s): LIPASE, AMYLASE in the last 168 hours. No results for input(s): AMMONIA in the last 168 hours. CBC: Recent Labs  Lab 03/09/19 0718 03/09/19 1010 03/10/19 0318 03/10/19 0415  WBC 10.5  --   --  11.3*  NEUTROABS 4.1  --   --   --   HGB 14.5 13.9 13.6 12.8*  HCT 45.2 41.0 40.0 40.5  MCV 102.5*  --   --  101.0*  PLT 247  --   --  211    Cardiac Enzymes: No results for input(s): CKTOTAL, CKMB, CKMBINDEX, TROPONINI in the last 168 hours. BNP: Invalid input(s): POCBNP CBG: Recent Labs  Lab 03/10/19 1643 03/10/19 1956 03/10/19 2350 03/11/19 0311 03/11/19 0732  GLUCAP 123* 111* 155* 140* 118*   D-Dimer Recent Labs    03/09/19 0718  DDIMER 0.44   Hgb A1c No results for input(s): HGBA1C in the last 72 hours. Lipid Profile Recent Labs    03/09/19 0720  TRIG 61   Thyroid function studies No results for input(s): TSH, T4TOTAL, T3FREE, THYROIDAB in the last 72 hours.  Invalid input(s): FREET3 Anemia work up Recent Labs    03/09/19 0718  FERRITIN 96   Urinalysis    Component Value Date/Time   COLORURINE STRAW (A) 03/09/2019 1037   APPEARANCEUR CLEAR 03/09/2019 1037   LABSPEC 1.012 03/09/2019 1037   PHURINE 5.0 03/09/2019 1037   GLUCOSEU NEGATIVE 03/09/2019 1037   HGBUR NEGATIVE 03/09/2019 [place] 03/09/2019 1037   KETONESUR NEGATIVE 03/09/2019 1037   PROTEINUR NEGATIVE 03/09/2019 1037   NITRITE NEGATIVE 03/09/2019 1037   LEUKOCYTESUR NEGATIVE 03/09/2019 1037   Sepsis Labs Invalid input(s): PROCALCITONIN,  WBC,  LACTICIDVEN Microbiology Recent Results (from the past 240 hour(s))  SARS Coronavirus 2 [facility] order, Performed in [place] hospital lab)     Status: None   Collection Time: 03/09/19  7:21 AM  Result Value Ref Range Status   SARS Coronavirus 2 NEGATIVE NEGATIVE Final    Comment: (NOTE) If result is NEGATIVE SARS-CoV-2 target nucleic acids are NOT DETECTED. The SARS-CoV-2 RNA is generally detectable in upper and lower  respiratory specimens during the acute phase of infection. The lowest  concentration of SARS-CoV-2 viral copies this assay can detect is 250  copies / mL. A negative result does not preclude SARS-CoV-2 infection  and should not be used as the sole basis for treatment or other  patient management decisions.  A negative result may occur with   improper specimen collection / handling, submission of specimen other  than nasopharyngeal swab, presence of viral mutation(s) within the  areas targeted by this assay, and inadequate number of viral copies  (<250 copies / mL). A negative result must be combined with clinical  observations, patient history, and epidemiological information. If result is POSITIVE SARS-CoV-2 target nucleic acids are DETECTED. The SARS-CoV-2 RNA is generally detectable in upper and lower  respiratory specimens dur ing the acute phase of infection.  Positive  results are indicative of active infection with SARS-CoV-2.  Clinical  correlation with patient history and other diagnostic information is  necessary to determine patient infection status.  Positive results do  not rule out bacterial infection or co-infection with other viruses. If result is PRESUMPTIVE POSTIVE SARS-CoV-2 nucleic acids MAY BE PRESENT.   A presumptive positive result was obtained on the submitted specimen  and confirmed on repeat testing.  While 2019 novel coronavirus  (SARS-CoV-2) nucleic acids may be present in  the submitted sample  additional confirmatory testing may be necessary for epidemiological  and / or clinical management purposes  to differentiate between  SARS-CoV-2 and other Sarbecovirus currently known to infect humans.  If clinically indicated additional testing with an alternate test  methodology [phone]) is advised. The SARS-CoV-2 RNA is generally  detectable in upper and lower respiratory sp ecimens during the acute  phase of infection. The expected result is Negative. Fact Sheet for Patients:  StrictlyIdeas.no Fact Sheet for Healthcare Providers: BankingDealers.co.za This test is not yet approved or cleared by the [place] FDA and has been authorized for detection and/or diagnosis of SARS-CoV-2 by FDA under an Emergency Use Authorization (EUA).  This EUA will  remain in effect (meaning this test can be used) for the duration of the COVID-19 declaration under Section 564(b)(1) of the Act, 21 U.S.C. section 360bbb-3(b)(1), unless the authorization is terminated or revoked sooner. Performed at [facility], [place] [address] [place], [place] [address]   Respiratory Panel by PCR     Status: None   Collection Time: 03/09/19  7:21 AM  Result Value Ref Range Status   Adenovirus NOT DETECTED NOT DETECTED Final   Coronavirus 229E NOT DETECTED NOT DETECTED Final    Comment: (NOTE) The Coronavirus on the Respiratory Panel, DOES NOT test for the novel  Coronavirus (2019 nCoV)    Coronavirus HKU1 NOT DETECTED NOT DETECTED Final   Coronavirus NL63 NOT DETECTED NOT DETECTED Final   Coronavirus OC43 NOT DETECTED NOT DETECTED Final   Metapneumovirus NOT DETECTED NOT DETECTED Final   Rhinovirus / Enterovirus NOT DETECTED NOT DETECTED Final   Influenza A NOT DETECTED NOT DETECTED Final   Influenza B NOT DETECTED NOT DETECTED Final   Parainfluenza Virus 1 NOT DETECTED NOT DETECTED Final   Parainfluenza Virus 2 NOT DETECTED NOT DETECTED Final   Parainfluenza Virus 3 NOT DETECTED NOT DETECTED Final   Parainfluenza Virus 4 NOT DETECTED NOT DETECTED Final   Respiratory Syncytial Virus NOT DETECTED NOT DETECTED Final   Bordetella pertussis NOT DETECTED NOT DETECTED Final   Chlamydophila pneumoniae NOT DETECTED NOT DETECTED Final   Mycoplasma pneumoniae NOT DETECTED NOT DETECTED Final    Comment: Performed at [facility], [place]. [address], [place], [place] [address]  Culture, respiratory (tracheal aspirate)     Status: None (Preliminary result)   Collection Time: 03/09/19  9:56 AM  Result Value Ref Range Status   Specimen Description TRACHEAL ASPIRATE  Final   Special Requests NONE  Final   Gram Stain   Final    ABUNDANT WBC PRESENT, PREDOMINANTLY PMN RARE GRAM POSITIVE COCCI    Culture   Final    CULTURE REINCUBATED FOR BETTER GROWTH Performed  at [facility], [place] [address], [place], [place] [address]    Report Status PENDING  Incomplete  Culture, blood (routine x 2)     Status: None (Preliminary result)   Collection Time: 03/09/19 10:10 AM  Result Value Ref Range Status   Specimen Description BLOOD LEFT HAND  Final   Special Requests   Final    BOTTLES DRAWN AEROBIC AND ANAEROBIC Blood Culture results may not be optimal due to an inadequate volume of blood received in culture bottles   Culture   Final    NO GROWTH 1 DAY Performed at [facility], [place] [address] [place], [place] [address]    Report Status PENDING  Incomplete  Culture, blood (routine x 2)  Status: None (Preliminary result)   Collection Time: 03/09/19 10:20 AM  Result Value Ref Range Status   Specimen Description BLOOD LEFT WRIST  Final   Special Requests   Final    BOTTLES DRAWN AEROBIC AND ANAEROBIC Blood Culture results may not be optimal due to an inadequate volume of blood received in culture bottles   Culture   Final    NO GROWTH 1 DAY Performed at [facility], [place] [address] [place], [place] [address]    Report Status PENDING  Incomplete  Urine culture     Status: None   Collection Time: 03/09/19 10:37 AM  Result Value Ref Range Status   Specimen Description URINE, RANDOM  Final   Special Requests NONE  Final   Culture   Final    NO GROWTH Performed at [facility], [address] [place], [place] [address]    Report Status 03/10/2019 FINAL  Final  MRSA PCR Screening     Status: None   Collection Time: 03/09/19 11:33 AM  Result Value Ref Range Status   MRSA by PCR NEGATIVE NEGATIVE Final    Comment:        The GeneXpert MRSA Assay (FDA approved for NASAL specimens only), is one component of a comprehensive MRSA colonization surveillance program. It is not intended to diagnose MRSA infection nor to guide or monitor treatment for MRSA infections. Performed at [facility], [place] [address]  [place], [place] [address]      Time coordinating discharge: 35 minutes  SIGNED:   Louellen Molder, MD  [facility] 03/11/2019, 9:22 AM Pager   If 7PM-7AM, please contact night-coverage [email or web address] Password [email or web address]

## 2019-03-11 NOTE — Discharge Instructions (Signed)

## 2019-03-12 LAB — CULTURE, RESPIRATORY W GRAM STAIN

## 2019-03-12 LAB — HIV ANTIBODY (ROUTINE TESTING W REFLEX): HIV Screen 4th Generation wRfx: NONREACTIVE

## 2019-03-13 ENCOUNTER — Other Ambulatory Visit: Payer: Self-pay | Admitting: *Deleted

## 2019-03-13 ENCOUNTER — Other Ambulatory Visit: Payer: Self-pay

## 2019-03-13 NOTE — Patient Outreach (Addendum)
[place] [facility]) Care Management  03/13/2019  [name] 26-Apr-1956 [id number]   Transition of Care Referral   Referral Date:  03/13/19 Referral Source:  [facility] inpatient referral  Date of Admission:  03/09/19 Diagnosis: acute respiratory failure with hypoxia, tobacco use disorder, COPD exacerbation, uncontrolled HTN  Date of Discharge: 03/11/19 Facility: [place] attempt # 1 A mobile number  No answer. [facility] RN CM left HIPAA compliant voicemail message along with CM's contact info.  Outreach attempt # 1 B home number  No answer. [facility] RN CM left HIPAA compliant voicemail message along with CM's contact info.    Plan: [facility] RN CM sent an unsuccessful outreach letter and scheduled this patient for another call attempt within 4 business days   Jayleene Glaeser L. Lavina Hamman, RN, BSN, [place] Management Care Coordinator Direct Number [phone] Mobile number [phone]  Main [facility] number [phone] Fax number [phone]

## 2019-03-13 NOTE — Patient Outreach (Addendum)
[place] [facility]) Care Management  03/13/2019  MANSON LUCKADOO 29-Aug-1956 [id number]   Transition of Care Referral  Referral Date:  03/13/19 Referral Source:  [facility] inpatient referral  Date of Admission:  03/09/19 Diagnosis: acute respiratory failure with hypoxia, tobacco use disorder, COPD exacerbation, uncontrolled HTN  Date of Discharge: 03/11/19 Facility: [place]: [employer] medicare   Patient's wife returned a call to [place] from [phone]  Patient is able to verify HIPAA Reviewed and addressed Transition of care referral to [facility] with patient and his wife Corliss Skains Permission was given from Mr Mccamy during the call to speak with Mrs Arel Tippen   Transition of care assessment completed  Discharge MD note indicates "He missed feeling his inhaler prescriptions 2 days prior to admission and presented with increasing shortness of breath and wheezing.  Patient was found to be in hypoxic respiratory failure and was intubated and admitted to the ICU.  He was extubated the next day and transferred to telemetry."  Mr Dowdy was assessed for COVID 19 precautions He does have protective masks and gloves at home He was encouraged to take precautions related to his COPD, HTN diagnoses. He giggled when CM encouraged him to also be careful going outside related also to pollen.  He reports he was outside on 03/12/19 without protective items. Mrs Gautreau reinforced encouragement for him to follow precautions. "He is hard headed" per his wife  Mrs Seder reports preparing to go to work a 2 pm and confirms the only working number to reach Mr Abrahamsen at this time is her [phone] mobile number in which she will have with her at work. She states Mr Wehner does not have a working phone at this time   Social: Mr Scerbo lives at home with his wife, Corliss Skains. Mrs Plaskett is in healthcare. He is independent with his care needs. Mrs Ingalls confirms if Mr Oberman needs to go to medical appointments she  will assist    Conditions: acute respiratory failure with hypoxia, tobacco use disorder, COPD exacerbation, uncontrolled HTN,  lymph node enlargement, erectile dysfunction tobacco use disorder hx of colonic polyps, tooth pain, hx of non adherence to medical treatments    DME: nebulizer BP cuff   Medications: Mr Metallo reports having issues with the change in his respiratory medications at discharge. He reports calling in the new medication but was informed he needs to have the MD to call in the medication.  According to the d/c instructions his prn Albuterol was switched to duoneb and he is to resume Spiriva and trelegy   CM discussed the options of reaching the [facility] MD or awaiting the follow up with Dr Yisroel Ramming. Mrs Swails states she prefers to await to see Dr Sanda Linger She states Mr Oddo has enough medications at home until he is to see primary care MD  Cm spoke with Zenia Resides at the [place] [phone] Consulted with [place] staff who is family with this patient   Mrs Michels confirmed with [facility] RN CM that Mr Fjelstad did not pick up his Trelegy until 03/11/19.- [place] staff had assisted with Trelegy in November 2019- Mrs Pore reports he has been using Trelegy only since Monday 03/11/19  Appointments: Mrs Higginbotham report they have not made Mr Kissel f/u appointment with Dr Yisroel Ramming yet but will make the appointment  [facility] RN CM assisted with getting Mr Rubey a hospital f/u appointment on 03/19/19 at 0910  Wife was updated  and states she will assist in getting him to the appointment to Dr Yisroel Ramming  Advance Directives: Denies need for assist with advance directives   Consent: [facility] RN CM reviewed [facility] services with patient. Patient gave verbal consent for services.  Plan [facility] RN CM will close the telephonic case at this time as patient has been assessed and referred to [place] for medication reconciliation and consultation with primary MD r/t respiratory medication management prn   Pt and  his wife were encouraged to return a call to [facility] RN CM prn  [facility] RN CM sent a successful outreach letter as discussed with [facility] brochure enclosed for review  Routed noted to MD  [place]. Lavina Hamman, RN, BSN, [place] Coordinator Office number [phone] Mobile number [phone]  Main [facility] number [phone] Fax number [phone]

## 2019-03-14 ENCOUNTER — Telehealth: Payer: Self-pay | Admitting: Pharmacist

## 2019-03-14 LAB — CULTURE, BLOOD (ROUTINE X 2)
Culture: NO GROWTH
Culture: NO GROWTH

## 2019-03-14 NOTE — Patient Outreach (Signed)
[place] [facility]) Care Management  03/14/2019  [name] November 11, 1956 [id number]   Patient was called regarding medication assistance and review. Unfortunately, he did not answer the phone. HIPAA compliant messages were left on both numbers listed in his chart.  Patient was discharged on both Spiriva and Trelegy.  Trelegy was filled on 03/11/2019 and Spiriva was filled on 03/06/2019. Trelegy contains LABA/LAMA and ICS. Spiriva is a LAMA.  An inbox message was sent to Dr. Yisroel Ramming to see if the patient needs to be on both medications.  Plan: Send patient an unsuccessful outreach letter. Call patient back in 5-7 business days.  Elayne Guerin, PharmD, [place] Clinical Pharmacist [phone]

## 2019-03-15 ENCOUNTER — Other Ambulatory Visit: Payer: Self-pay | Admitting: Pharmacist

## 2019-03-15 ENCOUNTER — Other Ambulatory Visit: Payer: Self-pay | Admitting: Family Medicine

## 2019-03-15 DIAGNOSIS — J449 Chronic obstructive pulmonary disease, unspecified: Secondary | ICD-10-CM

## 2019-03-15 MED ORDER — FLUTICASONE-UMECLIDIN-VILANT 100-62.5-25 MCG/INH IN AEPB: 1.0000 "application " | INHALATION_SPRAY | Freq: Every day | RESPIRATORY_TRACT | 2 refills | Status: DC

## 2019-03-15 NOTE — Patient Outreach (Signed)
[place] [facility]) Care Management  03/15/2019  [name] 03-Oct-1956 [id number]   Patient called me after 5:00pm.  The call was answered because the original referral stated the patient did not have his inhalers. Patient confirmed he is using Trelegy.  He said he had not attempted to fill any of his inhalers this year. He said he called in [place] but did not go and pick it up.  Patient said he was just coming in the house from cutting grass and could not do a full medication review.  Plan: Scheduled a phone medication review with the patient on Monday 03/18/2019 at 8:30am.  Will route a note to the patient's provider to let him know patient is not using Spiriva and to request a new prescription be sent to his pharmacy for a 3 month supply.  Elayne Guerin, PharmD, [place] Clinical Pharmacist [phone]

## 2019-03-18 ENCOUNTER — Other Ambulatory Visit: Payer: Self-pay | Admitting: Pharmacist

## 2019-03-18 NOTE — Patient Outreach (Signed)
[place] [facility]) Care Management  [place]   03/18/2019  [name] 01-26-1956 [id number]  Reason for referral: Medication Reconciliation Post Discharge  Referral source: [facility] RN Current insurance: [facility]  PMHx includes but not limited to:   COPD, tobacco abuse, hypertension, and erectile disorder.  Outreach:  Successful telephone call with patient.  HIPAA identifiers verified.   Subjective:  Patient does not use an adherence strategy.  Does the patient ever forget to take medication?  yes Does the patient have problems obtaining medications due to transportation?   no Does the patient have problems obtaining medications due to cost?  no  Does the patient feel that medications prescribed are effective?  yes Does the patient ever experience any side effects to the medications prescribed?  yes  Does the patient measure his/her own blood glucose at home?  No Does the patient measure his/her own blood pressure at home? No   Objective: Lab Results  Component Value Date   CREATININE 1.38 (H) 03/10/2019   CREATININE 1.23 03/09/2019   CREATININE 1.22 03/09/2019    No results found for: HGBA1C  Lipid Panel     Component Value Date/Time   CHOL 171 04/17/2009 2248   TRIG 61 03/09/2019 0720   HDL 49 04/17/2009 2248   CHOLHDL 3.5 Ratio 04/17/2009 2248   VLDL 14 04/17/2009 2248   LDLCALC 108 (H) 04/17/2009 2248    BP Readings from Last 3 Encounters:  03/11/19 (!) 160/91  10/13/18 138/66  09/19/18 (!) 164/97    Allergies  Allergen Reactions  . Lisinopril Swelling    Medications Reviewed Today    Reviewed by Elayne Guerin, [facility] (Pharmacist) on 03/18/19 at 0907  Med List Status: <None>  Medication Order Taking? Sig Documenting Provider Last Dose Status Informant  acetaminophen (TYLENOL) 500 MG tablet [id number] Yes Take 1,000 mg by mouth as needed for mild pain. [provider] Taking Active Spouse/Significant Other  albuterol  (VENTOLIN HFA) 108 (90 Base) MCG/ACT inhaler [id number] Yes Inhale 2 puffs into the lungs every 6 (six) hours as needed for wheezing or shortness of breath. Dhungel, Nishant, MD Taking Active   amLODipine (NORVASC) 5 MG tablet [id number] Yes Take 2 tablets (10 mg total) by mouth daily. Louellen Molder, MD Taking Active   Fluticasone-Umeclidin-Vilant (TRELEGY ELLIPTA) 100-62.5-25 MCG/INH AEPB [id number] Yes Inhale 1 application into the lungs daily. [place] Bing, DO Taking Active   ipratropium-albuterol (DUONEB) 0.5-2.5 (3) MG/3ML SOLN [id number] Yes Take 3 mLs by nebulization every 6 (six) hours as needed. Dhungel, Nishant, MD Taking Active   loratadine (ALLERGY RELIEF) 10 MG tablet [id number] Yes Take 10 mg by mouth daily as needed for allergies. [provider] Taking Active Spouse/Significant Other  predniSONE (DELTASONE) 20 MG tablet [id number] Yes Take 1 tablet (20 mg total) by mouth daily with breakfast for 12 days. Louellen Molder, MD Taking Active           ASSESSMENT: Date Discharged from Hospital: 03/13/2019 Date Medication Reconciliation Performed: 03/18/2019  Medications:  New at Discharge: . Ventolin HFA . Ipratropium-Albuterol nebulizer solution  Adjustments at Discharge: . Amlodipine 5mg -dose increased to 2 tablets daily  Discontinued at Discharge:  albuterol (2.5 MG/3ML) 0.083% nebulizer solution  aspirin 81 MG EC tablet doxycycline 100 MG tablet  fexofenadine 180 MG tablet fluticasone 50 MCG/ACT nasal spray  naproxen 500 MG tablet   Patient was recently discharged from hospital and all medications have been reviewed.  Drugs sorted by system:  Cardiovascular: Amlodipine  Pulmonary/Allergy:  Ventolin HFA, Trelegy, Ipratropium-albuterol nebulizer solution, loratadine, prednisone,   Pain: Acetaminophen  Medication Review Findings: -Aspirin was discontinued at discharge but the patient reported still taking it. We reviewed his discharge summary while on  the phone. Patient has a follow up visit with his provider tomorrow. He said he will stop aspirin for now as instructed on the discharge instructions.  -Adherence- Amlodipine 5mg  was increased from 1 tablet daily to 2 tablets daily upon discharge. Unfortunately, the patient said he was unaware of the increase and reported taking 1 tablet daily. A note will be sent to the patient's provider requesting a new prescription be sent to the patient's pharmacy for Norvasc 10mg  1 tablet daily (90 day supply).  Medication Adherence Findings: Adherence Review  []  Excellent (no doses missed/week)     []  Good (no more than 1 dose missed/week)     []  Partial (2-3 doses missed/week) [x]  Poor (>3 doses missed/week)  Patient with good understanding of regimen and good understanding of indications.    Potential of compliance: fair  Medication Assistance Findings:  No medication assistance needs identified  Extra Help:  Already receiving Full Extra Help Low Income Subsidy   Patient does not qualify for patient assistance programs since he has Extra Help.  Patient Assistance Programs:  Plan: Will follow-up in 1 week..  Route note to patient's PCP.   Elayne Guerin, PharmD, [place] Clinical Pharmacist [phone]

## 2019-03-19 ENCOUNTER — Other Ambulatory Visit: Payer: Self-pay | Admitting: Family Medicine

## 2019-03-19 ENCOUNTER — Other Ambulatory Visit: Payer: Self-pay

## 2019-03-19 ENCOUNTER — Ambulatory Visit (INDEPENDENT_AMBULATORY_CARE_PROVIDER_SITE_OTHER): Payer: Medicare HMO | Admitting: Family Medicine

## 2019-03-19 ENCOUNTER — Telehealth: Payer: Self-pay | Admitting: Family Medicine

## 2019-03-19 VITALS — BP 148/62 | HR 88 | Temp 98.2°F | Ht 69.0 in | Wt 124.0 lb

## 2019-03-19 DIAGNOSIS — J449 Chronic obstructive pulmonary disease, unspecified: Secondary | ICD-10-CM | POA: Diagnosis not present

## 2019-03-19 DIAGNOSIS — J441 Chronic obstructive pulmonary disease with (acute) exacerbation: Secondary | ICD-10-CM | POA: Diagnosis not present

## 2019-03-19 DIAGNOSIS — Z09 Encounter for follow-up examination after completed treatment for conditions other than malignant neoplasm: Secondary | ICD-10-CM | POA: Diagnosis not present

## 2019-03-19 DIAGNOSIS — R911 Solitary pulmonary nodule: Secondary | ICD-10-CM | POA: Diagnosis not present

## 2019-03-19 DIAGNOSIS — F17200 Nicotine dependence, unspecified, uncomplicated: Secondary | ICD-10-CM

## 2019-03-19 DIAGNOSIS — IMO0001 Reserved for inherently not codable concepts without codable children: Secondary | ICD-10-CM

## 2019-03-19 DIAGNOSIS — I10 Essential (primary) hypertension: Secondary | ICD-10-CM

## 2019-03-19 MED ORDER — FLUTICASONE-UMECLIDIN-VILANT 100-62.5-25 MCG/INH IN AEPB: 1.0000 "application " | INHALATION_SPRAY | Freq: Every day | RESPIRATORY_TRACT | 3 refills | Status: DC

## 2019-03-19 MED ORDER — FLUTICASONE-UMECLIDIN-VILANT 100-62.5-25 MCG/INH IN AEPB: 1.0000 "application " | INHALATION_SPRAY | Freq: Every day | RESPIRATORY_TRACT | 2 refills | Status: DC

## 2019-03-19 MED ORDER — AMLODIPINE BESYLATE 10 MG PO TABS: 10.0000 mg | ORAL_TABLET | Freq: Every day | ORAL | 3 refills | Status: DC

## 2019-03-19 NOTE — Assessment & Plan Note (Signed)
Patient is trying to quit cold [place].  I recommended that we may be able to offer some help with this.  I will message the pharmacist about ways that it may be affordable with his insurance to receive nicotine replacement.

## 2019-03-19 NOTE — Patient Instructions (Signed)
You are doing the right thing by taking 2 of the blood pressure medicines.  Keep using the trelegy.  Please stay home to avoid catching the virus that could be very dangerous to you.  Next time you get prednisone, ask the doctor for acid medicine for your stomach.  The pharmacist should call you with nicotine options.

## 2019-03-19 NOTE — Assessment & Plan Note (Signed)
Much improved today, patient is using Trelegy as directed.  Has not needed albuterol over the past couple of days.  Was having some stomach upset with prednisone.  He has self titrated to 20 mg daily.  I think this is reasonable given his respiratory improvement.  Asked him to discuss with his provider the next time he is prescribed steroids about GI prophylaxis.  I will send another refill of his Trelegy at the recommendation of pharmacy for his affordability.  He should get refills of 3 inhalers at once as he is on low income subsidy.

## 2019-03-19 NOTE — Progress Notes (Signed)
   CC: Hospital follow-up  HPI  hosp fu from COPD exacerbation Feels much better He knows about ASA and 2 pills norvasc via phone med rec w pharmacy this week.  He is taking one per day of prednisone, says this is tearing up his stomach. Doesn't want to use an acid medicine, didn't know he could do this. He is taking this with food.  He is working smoking. Right now he is smoking not at all the last 2 days. He has quit before cold [place] and chewed chewing gum and hard candy. Doesn't want cessation help.   ROS: Denies CP, SOB, abdominal pain, dysuria, changes in BMs.   CC, SH/smoking status, and VS noted  Objective: BP (!) 148/62   Pulse 88   Temp 98.2 F (36.8 C) (Oral)   Ht 5\' 9"  (1.753 m)   Wt 124 lb (56.2 kg)   SpO2 98%   BMI 18.31 kg/m  Gen: NAD, alert, cooperative, and pleasant. HEENT: NCAT, EOMI, PERRL CV: RRR, no murmur Resp: CTAB, no wheezes, non-labored Abd: SNTND, BS present, no guarding or organomegaly Ext: No edema, warm Neuro: Alert and oriented, Speech clear, No gross deficits  Assessment and plan:  COPD exacerbation (HCC) Much improved today, patient is using Trelegy as directed.  Has not needed albuterol over the past couple of days.  Was having some stomach upset with prednisone.  He has self titrated to 20 mg daily.  I think this is reasonable given his respiratory improvement.  Asked him to discuss with his provider the next time he is prescribed steroids about GI prophylaxis.  I will send another refill of his Trelegy at the recommendation of pharmacy for his affordability.  He should get refills of 3 inhalers at once as he is on low income subsidy.  Tobacco use disorder Patient is trying to quit cold [place].  I recommended that we may be able to offer some help with this.  I will message the pharmacist about ways that it may be affordable with his insurance to receive nicotine replacement.  Incidental lung nodule on d/c summary: I forgot to mention this  to the patient at his in person visit.  I called his contact information and reached his wife.  I discussed with her that he will need follow-up in 12 months via CT scan and that this is not a concerning finding based on the imaging.  She voiced understanding and she will relay this message to him when she gets home.  No orders of the defined types were placed in this encounter.   Meds ordered this encounter  Medications  . DISCONTD: Fluticasone-Umeclidin-Vilant (TRELEGY ELLIPTA) 100-62.5-25 MCG/INH AEPB    Sig: Inhale 1 application into the lungs daily.    Dispense:  2 each    Refill:  2    Ralene Ok, MD, PGY3 03/19/2019 4:04 PM

## 2019-03-19 NOTE — Telephone Encounter (Signed)
Alwyn Ren, we really appreciate your help with med review for this patient. He is trying to stop smoking, 2 days without smoking so far. He would like to try some nicotine gum or patch if there was an affordable way to do this for him. I told him I would reach out to you to see if there was a cheap way to do this through his insurance. I will copy his PCP. I saw that his trelegy order from Friday from Dr. Yisroel Ramming was listed as print, so I resent today electronically. I sent two inhalers bc pt was wanting to try to pick up multiple at once to pay only one copay?

## 2019-03-22 ENCOUNTER — Ambulatory Visit: Payer: Medicare HMO | Admitting: Pharmacist

## 2019-03-25 ENCOUNTER — Ambulatory Visit: Payer: Self-pay | Admitting: Pharmacist

## 2019-03-25 ENCOUNTER — Other Ambulatory Visit: Payer: Self-pay | Admitting: Pharmacist

## 2019-03-25 NOTE — Patient Outreach (Signed)
[place] [facility]) Care Management  03/25/2019  [name] Sep 15, 1956 [id number]   Patient was called to follow up on inhaler therapy for COPD. Unfortunately, he did not answer the phone. HIPAA compliant message was left.  Plan: Call patient back in 3-4 weeks due to upcoming PAL and follow up back log.   Elayne Guerin, PharmD, [place] Clinical Pharmacist [phone]

## 2019-03-28 ENCOUNTER — Telehealth: Payer: Self-pay | Admitting: *Deleted

## 2019-03-28 NOTE — Telephone Encounter (Signed)
I was calling this pt to make an appt to follow up with you, but it looks like he was seen by dr Lindell Noe on 4/28. Does he still need to come in to see you? Please advise. Deseree Kennon Holter, CMA

## 2019-04-03 ENCOUNTER — Other Ambulatory Visit: Payer: Self-pay

## 2019-04-03 ENCOUNTER — Other Ambulatory Visit: Payer: Self-pay | Admitting: Family Medicine

## 2019-04-03 DIAGNOSIS — I10 Essential (primary) hypertension: Secondary | ICD-10-CM

## 2019-04-04 ENCOUNTER — Telehealth: Payer: Self-pay | Admitting: *Deleted

## 2019-04-04 ENCOUNTER — Other Ambulatory Visit: Payer: Self-pay | Admitting: Family Medicine

## 2019-04-04 DIAGNOSIS — I10 Essential (primary) hypertension: Secondary | ICD-10-CM

## 2019-04-04 DIAGNOSIS — J449 Chronic obstructive pulmonary disease, unspecified: Secondary | ICD-10-CM

## 2019-04-04 MED ORDER — ALBUTEROL SULFATE HFA 108 (90 BASE) MCG/ACT IN AERS: 2.0000 | INHALATION_SPRAY | Freq: Four times a day (QID) | RESPIRATORY_TRACT | 2 refills | Status: DC | PRN

## 2019-04-04 NOTE — Telephone Encounter (Signed)
Pt calls because he would like albuterol and duoneb (nebulizer solution) called into [facility] mail order.  He is upset because trelegy and albuterol were called into [facility] instead.  He will [facility]leg[facility] from [facility] for now. Christen Bame, CMA

## 2019-04-05 ENCOUNTER — Other Ambulatory Visit: Payer: Self-pay | Admitting: Family Medicine

## 2019-04-05 DIAGNOSIS — J449 Chronic obstructive pulmonary disease, unspecified: Secondary | ICD-10-CM

## 2019-04-05 MED ORDER — IPRATROPIUM-ALBUTEROL 0.5-2.5 (3) MG/3ML IN SOLN: 3.0000 mL | Freq: Four times a day (QID) | RESPIRATORY_TRACT | 0 refills | Status: DC | PRN

## 2019-04-05 MED ORDER — ALBUTEROL SULFATE HFA 108 (90 BASE) MCG/ACT IN AERS: 2.0000 | INHALATION_SPRAY | Freq: Four times a day (QID) | RESPIRATORY_TRACT | 2 refills | Status: DC | PRN

## 2019-04-05 NOTE — Telephone Encounter (Signed)
Rx sent to [facility].  Harriet [place], [place], PGY-3

## 2019-04-17 MED ORDER — IPRATROPIUM-ALBUTEROL 0.5-2.5 (3) MG/3ML IN SOLN: 3.0000 mL | Freq: Four times a day (QID) | RESPIRATORY_TRACT | 3 refills | Status: DC | PRN

## 2019-04-17 NOTE — Telephone Encounter (Signed)
Pt calls again upset because "[facility] was [facility] and I was given albuterol instead of ventolin".  Attempted to explain that ventolin and albuterol were the same meds but this just made him more angry because "I know what meds work for me".  I was able to calm pt down by resending nebulizer meds to [facility] and setting him up a tele visit with MD to discuss meds and what he needs to be on.  Christen Bame, CMA

## 2019-04-17 NOTE — Addendum Note (Signed)
Addended by: Christen Bame D on: 04/17/2019 03:10 PM   Modules accepted: Orders

## 2019-04-19 ENCOUNTER — Other Ambulatory Visit: Payer: Self-pay

## 2019-04-19 ENCOUNTER — Telehealth (INDEPENDENT_AMBULATORY_CARE_PROVIDER_SITE_OTHER): Payer: Medicare HMO | Admitting: Family Medicine

## 2019-04-19 DIAGNOSIS — J449 Chronic obstructive pulmonary disease, unspecified: Secondary | ICD-10-CM

## 2019-04-19 MED ORDER — FLUTICASONE-UMECLIDIN-VILANT 100-62.5-25 MCG/INH IN AEPB: 1.0000 "application " | INHALATION_SPRAY | Freq: Every day | RESPIRATORY_TRACT | 3 refills | Status: DC

## 2019-04-19 NOTE — Assessment & Plan Note (Signed)
Continues to take rescue inhaler multiple days during the week.  This is usually a more difficult season for [name] and he has been outside more.  He does report feeling much transitioning to Trelegy.  He is without signs or symptoms of exacerbation.  I suspect he is overtreating his COPD with a rescue inhaler for chronic SOB.  Patient has access to rescue inhaler and nebulizer at home. - Discussed reasons he would need to take his rescue inhaler including increased sputum production or coughing and to inform me if symptoms worsen - Continue Trelegy daily, refill sent to [place] return precautions

## 2019-04-19 NOTE — Progress Notes (Signed)
[place] Telemedicine Visit  Patient consented to have virtual visit. Method of visit: Telephone  Encounter participants: Patient: [name] - located at home Provider: [place] Bing - located at office  Chief Complaint: "I need a refill"  HPI:  Mr. Pressly is a chronic COPD patient not on supplemental oxygen who is calling today to discuss his medications.  He was recently transition from [place] to Trelegy by pharmacy and reported good adherence but needs a refill sent to [facility].  [facility], however patient does not want to pay for this since he already pays for [facility].  He does report taking this daily.   Patient also has albuterol inhaler which he reports using during the daytime a few days out of the week but cannot quantify specifics.  He takes it primarily because he is outdoors and feels short of breath when cutting grass.  He denies any coughing, increased sputum.  He does have chronic SOB but denies chest pain or feelings of syncope.  Patient also has a nebulizer machine at home as needed for exacerbations.  His last exacerbation was back in April due to not having access to his controller medication.   ROS: per HPI  Pertinent PMHx: Reviewed  Exam:  Respiratory: Talking in complete sentences  Assessment/Plan:  COPD (chronic obstructive pulmonary disease) (HCC) Continues to take rescue inhaler multiple days during the week.  This is usually a more difficult season for Mr. Salvato and he has been outside more.  He does report feeling much transitioning to Trelegy.  He is without signs or symptoms of exacerbation.  I suspect he is overtreating his COPD with a rescue inhaler for chronic SOB.  Patient has access to rescue inhaler and nebulizer at home. - Discussed reasons he would need to take his rescue inhaler including increased sputum production or coughing and to inform me if symptoms worsen - Continue Trelegy daily,  refill sent to [place] return precautions    Time spent during visit with patient: 10 minutes

## 2019-04-23 ENCOUNTER — Other Ambulatory Visit: Payer: Self-pay | Admitting: Pharmacist

## 2019-04-23 ENCOUNTER — Ambulatory Visit: Payer: Self-pay | Admitting: Pharmacist

## 2019-04-23 NOTE — Patient Outreach (Signed)
[place] [facility]) Care Management  04/23/2019  [name] [birth year] [id number]   Patient was called to follow up on inhaler therapy and COPD management. Unfortunately, the patient did not answer the phone. HIPAA compliant message was left on his voicemail.  Plan: Call patient back in 6-8 weeks.  Elayne Guerin, PharmD, [place] Clinical Pharmacist [phone]

## 2019-04-30 ENCOUNTER — Other Ambulatory Visit: Payer: Self-pay

## 2019-04-30 DIAGNOSIS — J449 Chronic obstructive pulmonary disease, unspecified: Secondary | ICD-10-CM

## 2019-05-01 MED ORDER — ALBUTEROL SULFATE HFA 108 (90 BASE) MCG/ACT IN AERS: 2.0000 | INHALATION_SPRAY | Freq: Four times a day (QID) | RESPIRATORY_TRACT | 2 refills | Status: DC | PRN

## 2019-05-07 ENCOUNTER — Telehealth: Payer: Self-pay | Admitting: *Deleted

## 2019-05-07 NOTE — Telephone Encounter (Signed)
Pt lm on nurse line.   He is upset that his medications were denied by [employer] (cant find proof of this) and states that "we just dont have to worry about it because I am switching offices to someone who can do what I need".  To MD. Christen Bame, CMA

## 2019-06-25 ENCOUNTER — Ambulatory Visit: Payer: Self-pay | Admitting: Pharmacist

## 2019-06-25 ENCOUNTER — Other Ambulatory Visit: Payer: Self-pay | Admitting: Pharmacist

## 2019-06-25 NOTE — Patient Outreach (Signed)
[facility] [address]) Care Management  06/25/2019  [name] 1956/06/03 [id number]   Patient was called regarding medication adherence with his inhalers. Unfortunately, he did not answer the phone.  A message was left on his home phone with his wife and on his voicemail.   Plan: Send patient an unsuccessful outreach letter. Call patient back in 7-10 business days.   Elayne Guerin, PharmD, [place] Clinical Pharmacist [phone]

## 2019-07-04 ENCOUNTER — Other Ambulatory Visit: Payer: Self-pay | Admitting: Pharmacist

## 2019-07-04 NOTE — Patient Outreach (Addendum)
[facility]) Care Management  07/04/2019  [name] [birth year] [id number]   Patient was called regarding medication adherence with his inhalers. Unfortunately, he did not answer the phone.  A message was left on his mobile voicemail.  Plan: Send patient an unsuccessful outreach letter. Call patient back in 7-10 business days.

## 2019-07-12 ENCOUNTER — Ambulatory Visit: Payer: Self-pay | Admitting: Pharmacist

## 2019-07-26 ENCOUNTER — Other Ambulatory Visit: Payer: Self-pay | Admitting: Pharmacist

## 2019-07-26 ENCOUNTER — Other Ambulatory Visit: Payer: Self-pay

## 2019-07-26 ENCOUNTER — Ambulatory Visit: Payer: Self-pay | Admitting: Pharmacist

## 2019-07-26 DIAGNOSIS — J449 Chronic obstructive pulmonary disease, unspecified: Secondary | ICD-10-CM

## 2019-07-26 MED ORDER — ALBUTEROL SULFATE HFA 108 (90 BASE) MCG/ACT IN AERS: 2.0000 | INHALATION_SPRAY | Freq: Four times a day (QID) | RESPIRATORY_TRACT | 3 refills | Status: DC | PRN

## 2019-07-26 NOTE — Patient Outreach (Signed)
[facility]) Care Management  07/26/2019  [name] 1956/07/03 [id number]   Patient was called regarding medication adherence with his inhalers. Unfortunately, he did not answer the phone. A message was left on his mobile voicemail.  Patient was sent an unsuccessful outreach letter on 07/04/2019.    Today's call was the 3rd unsuccessful phone call.  Plan: Close patient's case due to inability to contact.  Elayne Guerin, PharmD, [place] Clinical Pharmacist [phone]

## 2019-08-04 IMAGING — DX DG CHEST 2V
2 series · 2 of 2 positions shown · non-contrast
Comparison: 08/22/2018

CLINICAL DATA: Shortness of breath, wheezing, cough and fever. COPD
exacerbation.

EXAM:
CHEST - 2 VIEW

[chest pa]
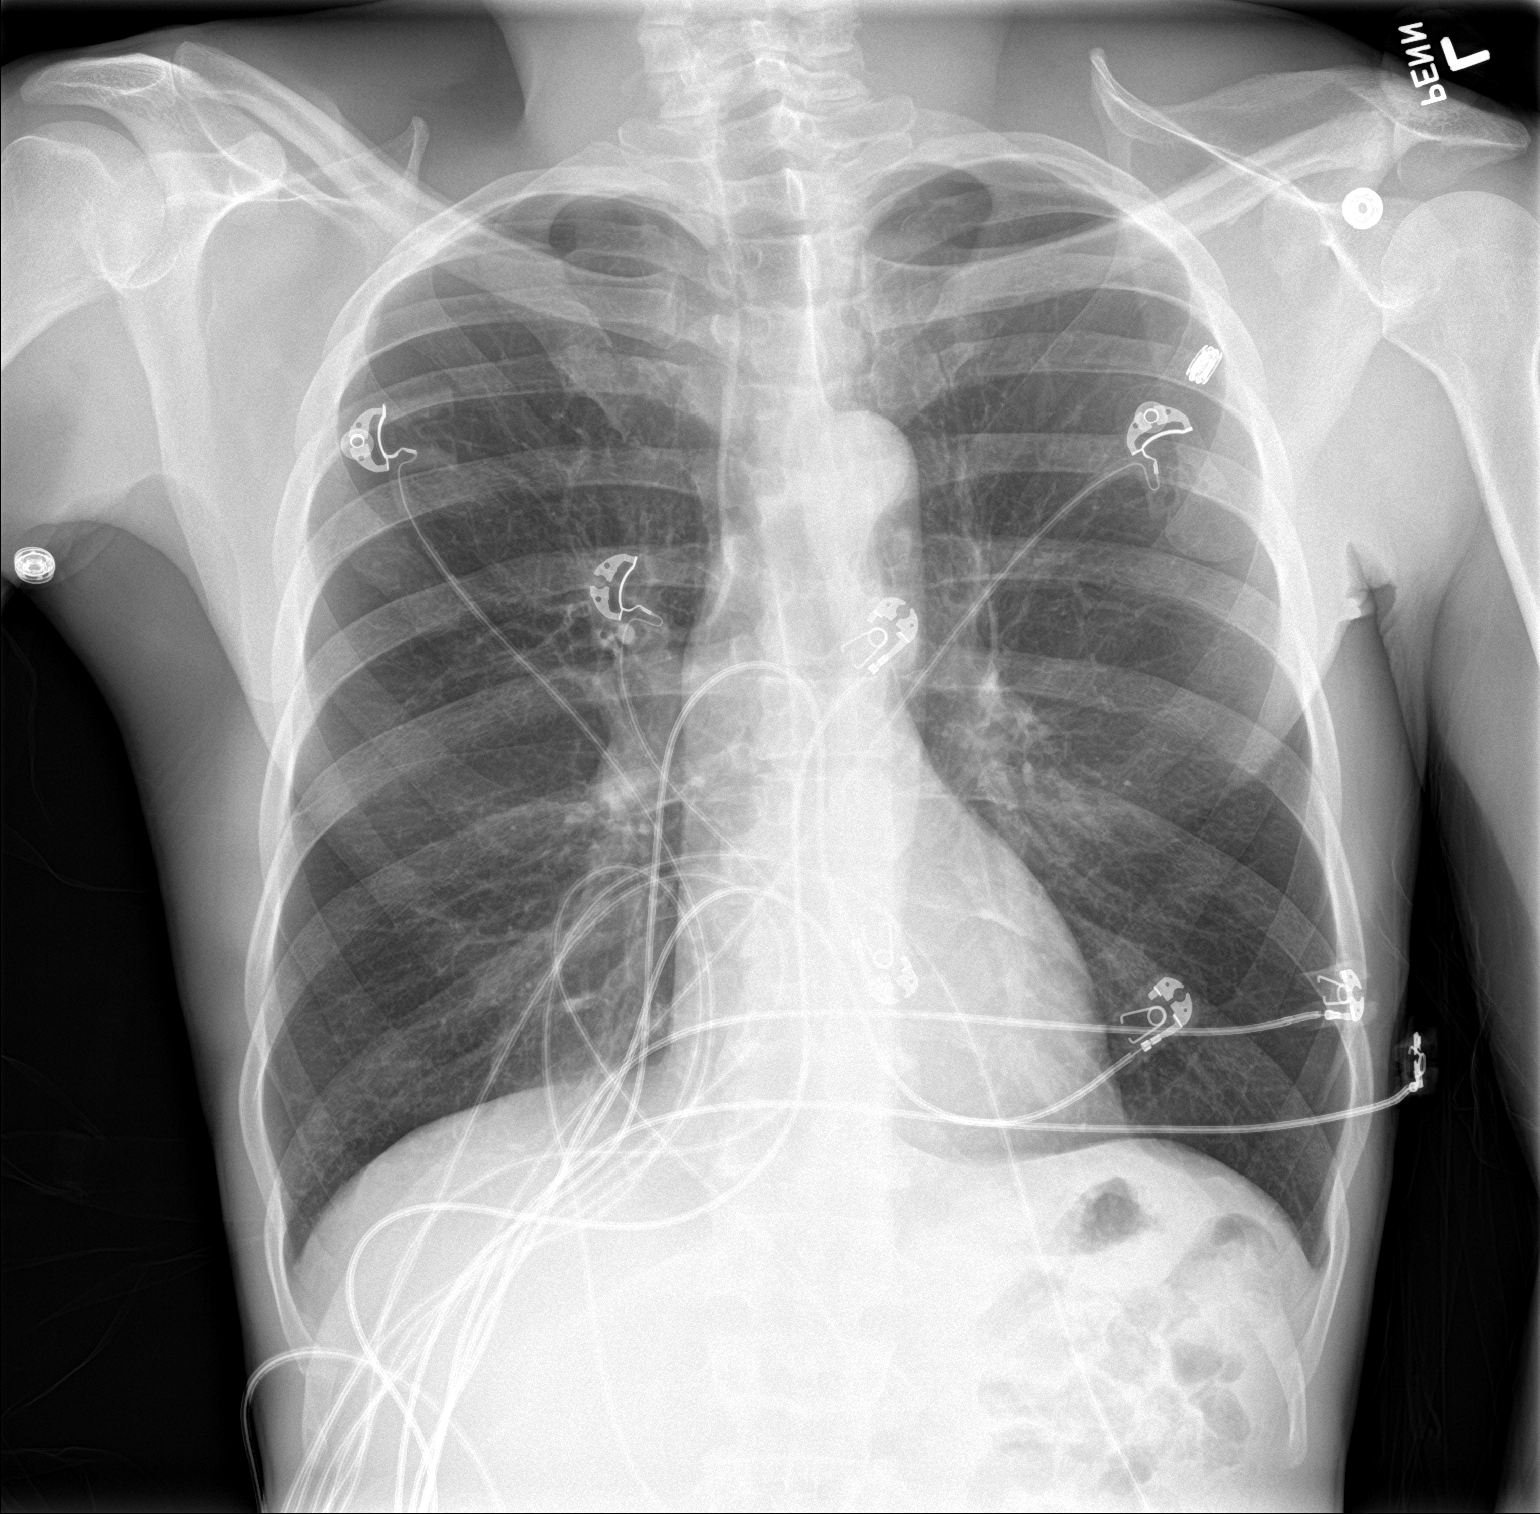

[chest lat]
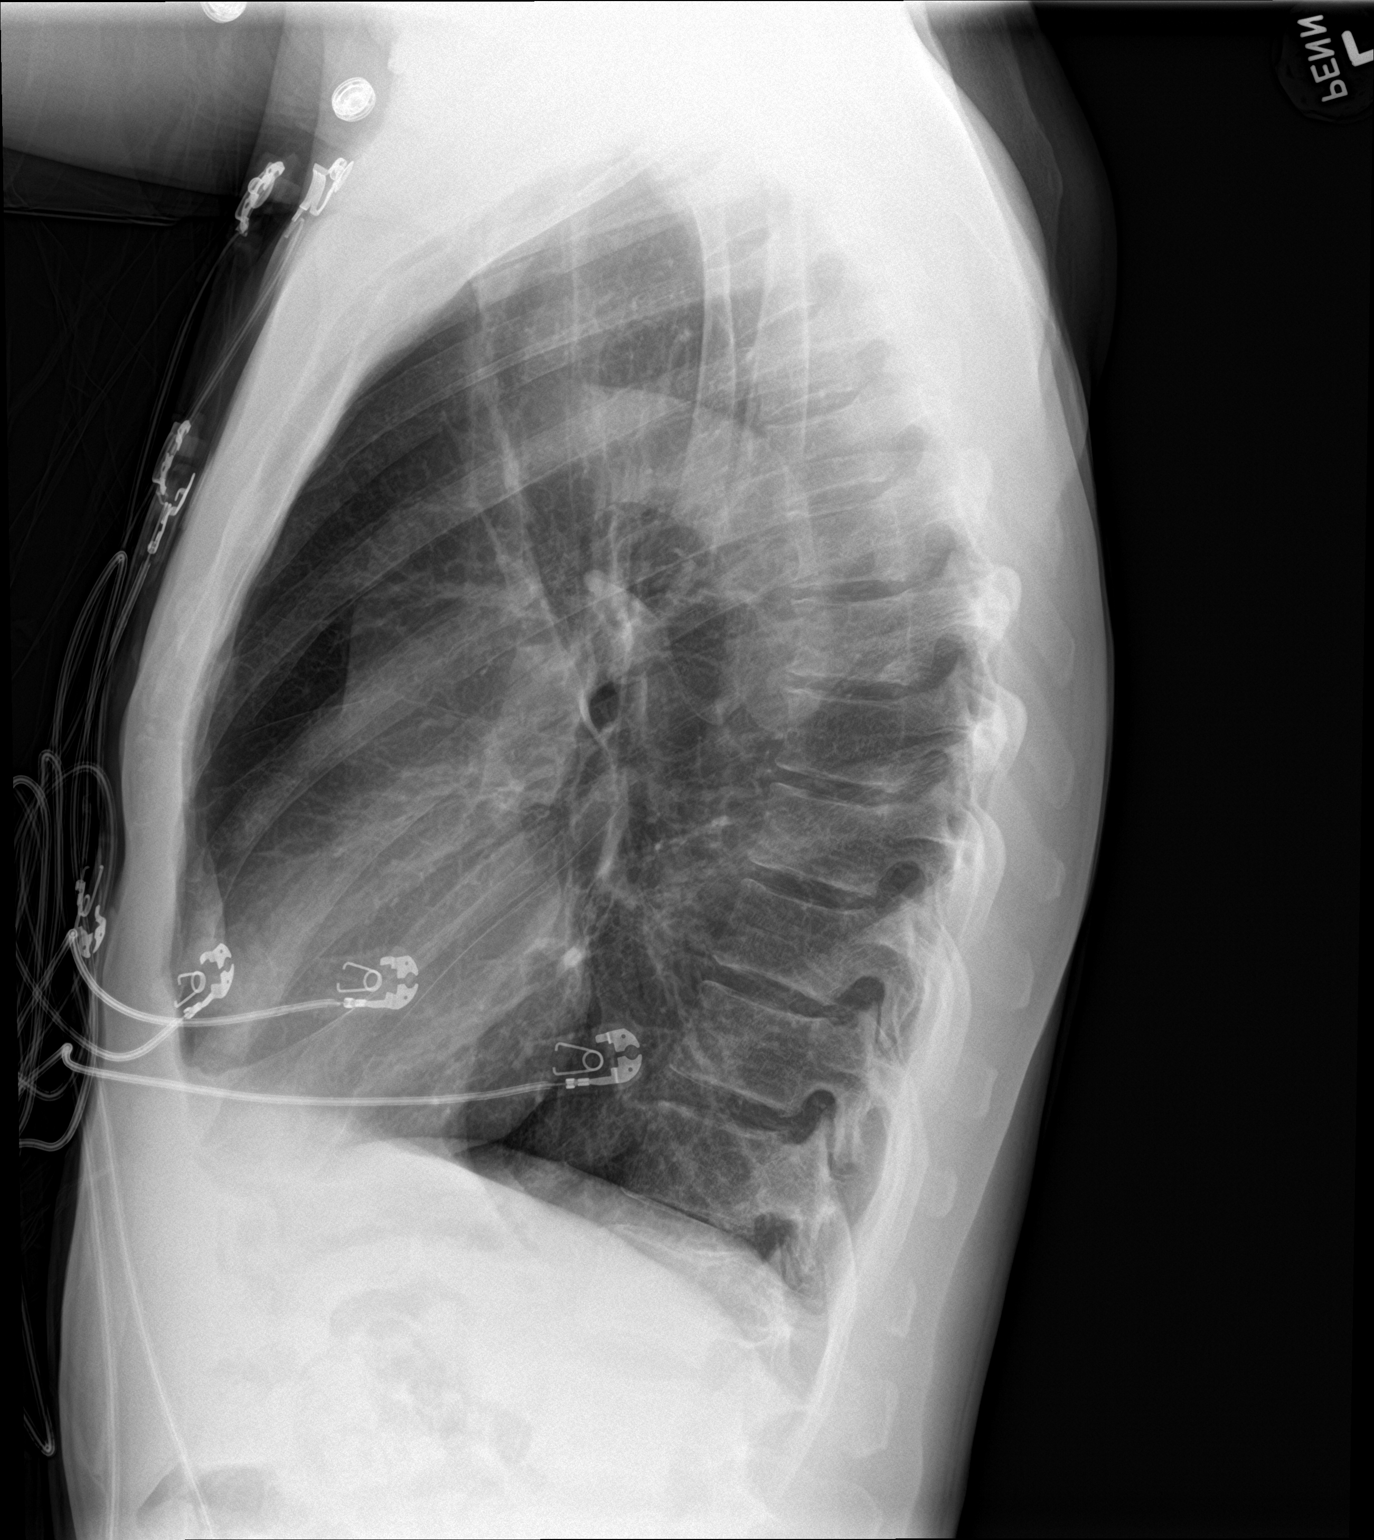

[2 of 2 positions shown; findings below may reference images not displayed]

FINDINGS: The heart size and mediastinal contours are within normal limits.
Stable emphysematous lung disease with mild to moderate bilateral
pulmonary hyperinflation. There is no evidence of pulmonary edema,
consolidation, pneumothorax, nodule or pleural fluid. The visualized
skeletal structures are unremarkable.
IMPRESSION: Stable emphysema and bilateral hyperinflation.

## 2019-08-12 ENCOUNTER — Other Ambulatory Visit: Payer: Self-pay | Admitting: Family Medicine

## 2019-08-12 DIAGNOSIS — J449 Chronic obstructive pulmonary disease, unspecified: Secondary | ICD-10-CM

## 2019-08-12 NOTE — Telephone Encounter (Signed)
Patient needs the refill for nebulizer filled but pharmacy says they cannot reach [place].  Please fill to [facility].  [phone]

## 2019-08-13 MED ORDER — IPRATROPIUM-ALBUTEROL 0.5-2.5 (3) MG/3ML IN SOLN: 3.0000 mL | Freq: Four times a day (QID) | RESPIRATORY_TRACT | 3 refills | Status: DC | PRN

## 2019-08-20 ENCOUNTER — Emergency Department (HOSPITAL_COMMUNITY): Payer: Medicare HMO

## 2019-08-20 ENCOUNTER — Other Ambulatory Visit: Payer: Self-pay

## 2019-08-20 ENCOUNTER — Emergency Department (HOSPITAL_COMMUNITY)
Admission: EM | Admit: 2019-08-20 | Discharge: 2019-08-21 | Discharge status: Against Medical Advice | Payer: Medicare HMO | Attending: Emergency Medicine | Admitting: Emergency Medicine

## 2019-08-20 ENCOUNTER — Encounter (HOSPITAL_COMMUNITY): Payer: Self-pay | Admitting: *Deleted

## 2019-08-20 DIAGNOSIS — R05 Cough: Secondary | ICD-10-CM | POA: Diagnosis not present

## 2019-08-20 DIAGNOSIS — Z5321 Procedure and treatment not carried out due to patient leaving prior to being seen by health care provider: Secondary | ICD-10-CM | POA: Diagnosis not present

## 2019-08-20 DIAGNOSIS — J449 Chronic obstructive pulmonary disease, unspecified: Secondary | ICD-10-CM | POA: Diagnosis present

## 2019-08-20 NOTE — ED Triage Notes (Signed)
Pt has had cold like symptoms for a couple of days causing his copd to flare up. +productive cough. Reports this usually happens and he gets antibiotics and prednisone. Pt has pcp appt on 10/12

## 2019-08-21 ENCOUNTER — Telehealth (INDEPENDENT_AMBULATORY_CARE_PROVIDER_SITE_OTHER): Payer: Medicare HMO | Admitting: Family Medicine

## 2019-08-21 ENCOUNTER — Other Ambulatory Visit: Payer: Self-pay

## 2019-08-21 DIAGNOSIS — J069 Acute upper respiratory infection, unspecified: Secondary | ICD-10-CM | POA: Insufficient documentation

## 2019-08-21 DIAGNOSIS — J40 Bronchitis, not specified as acute or chronic: Secondary | ICD-10-CM | POA: Diagnosis not present

## 2019-08-21 MED ORDER — PREDNISONE 10 MG PO TABS: ORAL_TABLET | ORAL | 0 refills | Status: DC

## 2019-08-21 MED ORDER — DOXYCYCLINE HYCLATE 100 MG PO TABS: 100.0000 mg | ORAL_TABLET | Freq: Two times a day (BID) | ORAL | 0 refills | Status: DC

## 2019-08-21 NOTE — Assessment & Plan Note (Signed)
Patient is presenting with vague symptoms that could have many possible etiologies.  Chest x-ray was negative for pneumonia.  Patient has a history of COPD, and an acute COPD exacerbation cannot be ruled out via telemedicine visit.  Given the global pandemic, COVID also needs to be considered.  Allergies are another possibility of the patient's productive cough, as well as common cold.  Additionally, productive cough could be due to patient recently quitting smoking, although I would have expected the gray phlegm to have started 2 weeks ago when he quit smoking rather than just 3 days ago. -We will prescribe doxycycline 100 mg twice daily x10 days -We will prescribe prednisone taper -Encourage patient to use albuterol nebulizer every 6 hours for the next 3 days for shortness of breath -Strict return precautions given, including worsening shortness of breath, fevers, and need for supplemental oxygen

## 2019-08-21 NOTE — ED Notes (Signed)
Call  And no answer

## 2019-08-21 NOTE — ED Notes (Signed)
Pt is complaining of long wait times. This tech tried to explain to pt about the long wait time . And pt was offered a O2 tank if he is sob but pt was not satisfied.

## 2019-08-21 NOTE — ED Notes (Signed)
Pt called in lobby for vitals recheck, no answer

## 2019-08-21 NOTE — Progress Notes (Signed)
[place] Telemedicine Visit  Patient consented to have virtual visit. Method of visit: Telephone  Encounter participants: Patient: [name] - located at home Provider: Daisy Floro - located at work from home Others (if applicable): None  Chief Complaint: Cold symptoms/COPD flare  HPI:  Patient is pleasant 63 year old man with history of COPD calling and reporting 2 days of "cold-like symptoms".  Last night he went to [facility] emergency department at 7 PM, but left at 3 AM before ever seeing a doctor.  Vitals at the time showed he was satting 99% on room air, was afebrile.  Chest x-ray revealed inflammatory, bronchitic changes, but was negative for pneumonia.  Patient is reporting shortness of breath on exertion and a productive cough.  He is denying nausea, vomiting, rashes, diarrhea, chest pain, stuffy nose, and lower extremity swelling.  Patient states when he typically feels like this he gets an antibiotic and prednisone and that makes him feel better.  Additionally, patient reports he has not smoked in a couple of weeks.  He also reports that his cough is productive for grayish-Storck phlegm.  ROS: per HPI  Pertinent PMHx: COPD  Exam:  Respiratory: Speaking in full sentences, normal work of breathing   Assessment/Plan:  Upper respiratory infection Patient is presenting with vague symptoms that could have many possible etiologies.  Chest x-ray was negative for pneumonia.  Patient has a history of COPD, and an acute COPD exacerbation cannot be ruled out via telemedicine visit.  Given the global pandemic, COVID also needs to be considered.  Allergies are another possibility of the patient's productive cough, as well as common cold.  Additionally, productive cough could be due to patient recently quitting smoking, although I would have expected the gray phlegm to have started 2 weeks ago when he quit smoking rather than just 3 days ago. -We will  prescribe doxycycline 100 mg twice daily x10 days -We will prescribe prednisone taper -Encourage patient to use albuterol nebulizer every 6 hours for the next 3 days for shortness of breath -Strict return precautions given, including worsening shortness of breath, fevers, and need for supplemental oxygen    Time spent during visit with patient: 13: 40 minutes  Milus Banister, [place], PGY-2 08/21/2019 6:13 PM

## 2019-09-02 ENCOUNTER — Encounter: Payer: Medicare HMO | Admitting: Family Medicine

## 2019-09-07 ENCOUNTER — Emergency Department (HOSPITAL_COMMUNITY)
Admission: EM | Admit: 2019-09-07 | Discharge: 2019-09-07 | Disposition: A | Discharge status: Home / Self Care | Payer: Medicare HMO | Attending: Emergency Medicine | Admitting: Emergency Medicine

## 2019-09-07 ENCOUNTER — Other Ambulatory Visit: Payer: Self-pay

## 2019-09-07 ENCOUNTER — Encounter (HOSPITAL_COMMUNITY): Payer: Self-pay | Admitting: Emergency Medicine

## 2019-09-07 ENCOUNTER — Emergency Department (HOSPITAL_COMMUNITY): Payer: Medicare HMO

## 2019-09-07 DIAGNOSIS — Z87891 Personal history of nicotine dependence: Secondary | ICD-10-CM | POA: Insufficient documentation

## 2019-09-07 DIAGNOSIS — Z79899 Other long term (current) drug therapy: Secondary | ICD-10-CM | POA: Diagnosis not present

## 2019-09-07 DIAGNOSIS — I10 Essential (primary) hypertension: Secondary | ICD-10-CM | POA: Insufficient documentation

## 2019-09-07 DIAGNOSIS — J441 Chronic obstructive pulmonary disease with (acute) exacerbation: Secondary | ICD-10-CM

## 2019-09-07 DIAGNOSIS — R9431 Abnormal electrocardiogram [ECG] [EKG]: Secondary | ICD-10-CM | POA: Diagnosis not present

## 2019-09-07 DIAGNOSIS — R0602 Shortness of breath: Secondary | ICD-10-CM | POA: Diagnosis not present

## 2019-09-07 DIAGNOSIS — J449 Chronic obstructive pulmonary disease, unspecified: Secondary | ICD-10-CM

## 2019-09-07 LAB — CBC
HCT: 46 % (ref 39.0–52.0)
Hemoglobin: 15.2 g/dL (ref 13.0–17.0)
MCH: 33.7 pg (ref 26.0–34.0)
MCHC: 33 g/dL (ref 30.0–36.0)
MCV: 102 fL — ABNORMAL HIGH (ref 80.0–100.0)
Platelets: 259 10*3/uL (ref 150–400)
RBC: 4.51 MIL/uL (ref 4.22–5.81)
RDW: 12.1 % (ref 11.5–15.5)
WBC: 6.7 10*3/uL (ref 4.0–10.5)
nRBC: 0 % (ref 0.0–0.2)

## 2019-09-07 LAB — BASIC METABOLIC PANEL
Anion gap: 9 (ref 5–15)
BUN: 16 mg/dL (ref 8–23)
CO2: 25 mmol/L (ref 22–32)
Calcium: 9.4 mg/dL (ref 8.9–10.3)
Chloride: 106 mmol/L (ref 98–111)
Creatinine, Ser: 1.59 mg/dL — ABNORMAL HIGH (ref 0.61–1.24)
GFR calc Af Amer: 52 mL/min — ABNORMAL LOW (ref >60)
GFR calc non Af Amer: 45 mL/min — ABNORMAL LOW (ref >60)
Glucose, Bld: 73 mg/dL (ref 70–99)
Potassium: 4 mmol/L (ref 3.5–5.1)
Sodium: 140 mmol/L (ref 135–145)

## 2019-09-07 MED ORDER — TRELEGY ELLIPTA 100-62.5-25 MCG/INH IN AEPB: 1.0000 "application " | INHALATION_SPRAY | Freq: Every day | RESPIRATORY_TRACT | 3 refills | Status: DC

## 2019-09-07 MED ORDER — ALBUTEROL SULFATE HFA 108 (90 BASE) MCG/ACT IN AERS
8.0000 | INHALATION_SPRAY | Freq: Once | RESPIRATORY_TRACT | Status: AC
  Administered 2019-09-07: 8 via RESPIRATORY_TRACT
  Filled 2019-09-07: qty 6.7

## 2019-09-07 MED ORDER — PREDNISONE 10 MG (21) PO TBPK: ORAL_TABLET | ORAL | 0 refills | Status: DC

## 2019-09-07 MED ORDER — DEXAMETHASONE SODIUM PHOSPHATE 10 MG/ML IJ SOLN
10.0000 mg | Freq: Once | INTRAMUSCULAR | Status: AC
  Administered 2019-09-07: 10 mg via INTRAMUSCULAR
  Filled 2019-09-07: qty 1

## 2019-09-07 NOTE — ED Provider Notes (Signed)
[place] EMERGENCY DEPARTMENT Provider Note   CSN: [id number] Arrival date & time: 09/07/19  0605     History   Chief Complaint No chief complaint on file.   HPI [name] is a 63 y.o. male.     Pt presents to the ED today with SOB.  He feels like he is having a COPD exacerbation.  He denies f/c.  He has been sob for the past few weeks.  He did see his pcp who put him on abx and prednisone.  However, he felt like the prednisone was not high enough.  The pt was around smokers last night, but does not smoke any more himself.  No f/c.  No covid exposures.     Past Medical History:  Diagnosis Date  . Asthma   . COPD (chronic obstructive pulmonary disease) ([place])   . Hypertension   . Shortness of breath     Patient Active Problem List   Diagnosis Date Noted  . Upper respiratory infection 08/21/2019  . Lung nodule < 6cm on CT 03/19/2019  . Uncontrolled hypertension 03/11/2019  . Primary hypertension   . Lymph node enlargement 12/10/2011  . Tobacco use disorder 08/04/2009  . Erectile dysfunction 12/31/2008  . COPD (chronic obstructive pulmonary disease) ([place]) 12/31/2008  . History of colonic polyps 01/18/2007    Past Surgical History:  Procedure Laterality Date  . COLONOSCOPY          Home Medications    Prior to Admission medications   Medication Sig Start Date End Date Taking? Authorizing Provider  acetaminophen (TYLENOL) 500 MG tablet Take 1,000 mg by mouth as needed for mild pain.    [provider]  albuterol (VENTOLIN HFA) 108 (90 Base) MCG/ACT inhaler Inhale 2 puffs into the lungs every 6 (six) hours as needed for wheezing or shortness of breath. 07/26/19   Mullis, Kiersten P, DO  amLODipine (NORVASC) 10 MG tablet Take 1 tablet (10 mg total) by mouth daily. 03/19/19   [place] Bing, DO  doxycycline (VIBRA-TABS) 100 MG tablet Take 1 tablet (100 mg total) by mouth 2 (two) times daily. 08/21/19   Daisy Floro, DO   Fluticasone-Umeclidin-Vilant (TRELEGY ELLIPTA) 100-62.5-25 MCG/INH AEPB Inhale 1 application into the lungs daily. 09/07/19   Isla Pence, MD  ipratropium-albuterol (DUONEB) 0.5-2.5 (3) MG/3ML SOLN Take 3 mLs by nebulization every 6 (six) hours as needed. 08/13/19   Mullis, Kiersten P, DO  loratadine (ALLERGY RELIEF) 10 MG tablet Take 10 mg by mouth daily as needed for allergies.    [provider]  predniSONE (STERAPRED UNI-PAK 21 TAB) 10 MG (21) TBPK tablet Take 6 tabs for 2 days, then 5 for 2 days, then 4 for 2 days, then 3 for 2 days, 2 for 2 days, then 1 for 2 days 09/07/19   Isla Pence, MD    Family History Family History  Problem Relation Age of Onset  . Cancer Father     Social History Social History   Tobacco Use  . Smoking status: Current Some Day Smoker    Packs/day: 0.50    Years: 30.00    Pack years: 15.00    Types: Cigarettes    Last attempt to quit: 08/21/2013    Years since quitting: 6.0  . Smokeless tobacco: Never Used  Substance Use Topics  . Alcohol use: Yes    Comment: rare/occasional  . Drug use: No     Allergies   Lisinopril   Review of  Systems Review of Systems  Respiratory: Positive for cough, shortness of breath and wheezing.   All other systems reviewed and are negative.    Physical Exam Updated Vital Signs BP (!) 179/88   Pulse 82   Temp 98.2 F (36.8 C) (Oral)   Resp (!) 22   SpO2 100%   Physical Exam Vitals signs and nursing note reviewed.  Constitutional:      Appearance: Normal appearance.  HENT:     Head: Normocephalic and atraumatic.     Right Ear: External ear normal.     Left Ear: External ear normal.     Nose: Nose normal.     Mouth/Throat:     Mouth: Mucous membranes are moist.     Pharynx: Oropharynx is clear.  Eyes:     Extraocular Movements: Extraocular movements intact.     Conjunctiva/sclera: Conjunctivae normal.     Pupils: Pupils are equal, round, and reactive to light.  Neck:      Musculoskeletal: Normal range of motion and neck supple.  Cardiovascular:     Rate and Rhythm: Normal rate and regular rhythm.     Pulses: Normal pulses.  Pulmonary:     Breath sounds: Wheezing present.  Abdominal:     General: Abdomen is flat. Bowel sounds are normal.  Musculoskeletal: Normal range of motion.  Skin:    General: Skin is warm.     Capillary Refill: Capillary refill takes less than 2 seconds.  Neurological:     General: No focal deficit present.     Mental Status: He is alert and oriented to person, place, and time.  Psychiatric:        Mood and Affect: Mood normal.        Behavior: Behavior normal.        Thought Content: Thought content normal.        Judgment: Judgment normal.      ED Treatments / Results  Labs (all labs ordered are listed, but only abnormal results are displayed) Labs Reviewed  BASIC METABOLIC PANEL - Abnormal; Notable for the following components:      Result Value   Creatinine, Ser 1.59 (*)    GFR calc non Af Amer 45 (*)    GFR calc Af Amer 52 (*)    All other components within normal limits  CBC - Abnormal; Notable for the following components:   MCV 102.0 (*)    All other components within normal limits    EKG EKG Interpretation  Date/Time:  Saturday September 07 2019 06:20:11 EDT Ventricular Rate:  88 PR Interval:  126 QRS Duration: 100 QT Interval:  404 QTC Calculation: 488 R Axis:   88 Text Interpretation:  Normal sinus rhythm Minimal voltage criteria for LVH, may be normal variant ( Cornell product ) Septal infarct , age undetermined Abnormal ECG No significant change since last tracing Confirmed by Isla Pence [phone]) on 09/07/2019 7:18:24 AM   Radiology Dg Chest 2 View  Result Date: 09/07/2019 CLINICAL DATA:  Shortness of breath.  History of COPD. EXAM: CHEST - 2 VIEW COMPARISON:  08/20/2019; 03/10/2019 FINDINGS: Grossly unchanged cardiac silhouette and mediastinal contours. The lungs remain hyperexpanded with  flattening of the diaphragms mild diffuse slightly nodular thickening of the pulmonary interstitium. There is minimal pleuroparenchymal thickening about the right minor and bilateral major fissures. No discrete focal airspace opacities. No pleural effusion or pneumothorax. No evidence of edema. No acute osseous abnormalities. IMPRESSION: Similar findings of lung hyperexpansion without superimposed acute cardiopulmonary  disease. Electronically Signed   By: Sandi Mariscal M.D.   On: 09/07/2019 06:48    Procedures Procedures (including critical care time)  Medications Ordered in ED Medications  albuterol (VENTOLIN HFA) 108 (90 Base) MCG/ACT inhaler 8 puff (8 puffs Inhalation Given 09/07/19 0620)  dexamethasone (DECADRON) injection 10 mg (10 mg Intramuscular Given 09/07/19 0745)     Initial Impression / Assessment and Plan / ED Course  I have reviewed the triage vital signs and the nursing notes.  Pertinent labs & imaging results that were available during my care of the patient were reviewed by me and considered in my medical decision making (see chart for details).     Pt is oxygenating well.  His CXR is negative.  He does not need any more abx.  He will be d/c home on a higher dose prednisone taper.  His Trelegy will be refilled.  Pt is stable for d/c home.  Return if worse.  Final Clinical Impressions(s) / ED Diagnoses   Final diagnoses:  COPD exacerbation [facility])    ED Discharge Orders         Ordered    predniSONE (STERAPRED UNI-PAK 21 TAB) 10 MG (21) TBPK tablet     09/07/19 0736    Fluticasone-Umeclidin-Vilant (TRELEGY ELLIPTA) 100-62.5-25 MCG/INH AEPB  Daily     09/07/19 0746           Isla Pence, MD 09/07/19 [phone]

## 2019-09-07 NOTE — ED Triage Notes (Signed)
Pt here with c/o sob over the last few days  pt has copd , pt just finished a prednisone dose pack from his primary care

## 2019-10-09 ENCOUNTER — Other Ambulatory Visit: Payer: Self-pay | Admitting: Family Medicine

## 2019-10-09 DIAGNOSIS — J449 Chronic obstructive pulmonary disease, unspecified: Secondary | ICD-10-CM

## 2019-10-15 ENCOUNTER — Emergency Department (HOSPITAL_COMMUNITY): Payer: Medicare HMO

## 2019-10-15 ENCOUNTER — Emergency Department (HOSPITAL_COMMUNITY)
Admission: EM | Admit: 2019-10-15 | Discharge: 2019-10-15 | Disposition: A | Discharge status: Home / Self Care | Payer: Medicare HMO | Attending: Emergency Medicine | Admitting: Emergency Medicine

## 2019-10-15 ENCOUNTER — Encounter (HOSPITAL_COMMUNITY): Payer: Self-pay | Admitting: Emergency Medicine

## 2019-10-15 ENCOUNTER — Other Ambulatory Visit: Payer: Self-pay | Admitting: Internal Medicine

## 2019-10-15 DIAGNOSIS — J449 Chronic obstructive pulmonary disease, unspecified: Secondary | ICD-10-CM | POA: Insufficient documentation

## 2019-10-15 DIAGNOSIS — R0602 Shortness of breath: Secondary | ICD-10-CM | POA: Diagnosis not present

## 2019-10-15 DIAGNOSIS — I10 Essential (primary) hypertension: Secondary | ICD-10-CM | POA: Insufficient documentation

## 2019-10-15 DIAGNOSIS — Z79899 Other long term (current) drug therapy: Secondary | ICD-10-CM | POA: Diagnosis not present

## 2019-10-15 DIAGNOSIS — F17210 Nicotine dependence, cigarettes, uncomplicated: Secondary | ICD-10-CM | POA: Diagnosis not present

## 2019-10-15 LAB — CBC
HCT: 44.3 % (ref 39.0–52.0)
Hemoglobin: 14.6 g/dL (ref 13.0–17.0)
MCH: 33.8 pg (ref 26.0–34.0)
MCHC: 33 g/dL (ref 30.0–36.0)
MCV: 102.5 fL — ABNORMAL HIGH (ref 80.0–100.0)
Platelets: 274 10*3/uL (ref 150–400)
RBC: 4.32 MIL/uL (ref 4.22–5.81)
RDW: 11.8 % (ref 11.5–15.5)
WBC: 6.4 10*3/uL (ref 4.0–10.5)
nRBC: 0 % (ref 0.0–0.2)

## 2019-10-15 LAB — BASIC METABOLIC PANEL
Anion gap: 10 (ref 5–15)
BUN: 12 mg/dL (ref 8–23)
CO2: 23 mmol/L (ref 22–32)
Calcium: 9.4 mg/dL (ref 8.9–10.3)
Chloride: 109 mmol/L (ref 98–111)
Creatinine, Ser: 1.22 mg/dL (ref 0.61–1.24)
GFR calc Af Amer: >60 mL/min (ref >60)
GFR calc non Af Amer: >60 mL/min (ref >60)
Glucose, Bld: 91 mg/dL (ref 70–99)
Potassium: 3.5 mmol/L (ref 3.5–5.1)
Sodium: 142 mmol/L (ref 135–145)

## 2019-10-15 LAB — TROPONIN I (HIGH SENSITIVITY): Troponin I (High Sensitivity): 6 ng/L (ref <18)

## 2019-10-15 MED ORDER — DOXYCYCLINE HYCLATE 100 MG PO TBEC: 100.0000 mg | DELAYED_RELEASE_TABLET | Freq: Two times a day (BID) | ORAL | 0 refills | Status: DC

## 2019-10-15 MED ORDER — DOXYCYCLINE HYCLATE 100 MG PO TABS
100.0000 mg | ORAL_TABLET | Freq: Once | ORAL | Status: AC
  Administered 2019-10-15: 22:00:00 100 mg via ORAL
  Filled 2019-10-15: qty 1

## 2019-10-15 MED ORDER — IPRATROPIUM-ALBUTEROL 0.5-2.5 (3) MG/3ML IN SOLN: 3.0000 mL | Freq: Once | RESPIRATORY_TRACT | Status: DC

## 2019-10-15 MED ORDER — ALBUTEROL SULFATE HFA 108 (90 BASE) MCG/ACT IN AERS
1.0000 | INHALATION_SPRAY | Freq: Once | RESPIRATORY_TRACT | Status: AC
  Administered 2019-10-15: 22:00:00 2 via RESPIRATORY_TRACT
  Filled 2019-10-15: qty 6.7

## 2019-10-15 MED ORDER — SODIUM CHLORIDE 0.9% FLUSH: 3.0000 mL | Freq: Once | INTRAVENOUS | Status: DC

## 2019-10-15 MED ORDER — PREDNISONE 20 MG PO TABS: 40.0000 mg | ORAL_TABLET | Freq: Every day | ORAL | 0 refills | Status: AC

## 2019-10-15 NOTE — Discharge Instructions (Addendum)
You were seen in the Emergency Department for shortness of breath. Thank you for allowing [place] to be part of your care.   Please follow up with your primary care physician regarding your COPD management.   Please note these changes made to your medications:   Please START taking:  1) Doxycycline 100 mg twice daily for 5 days 2) Prednisone 40 mg daily at breakfast for 5 days 3) Albuterol Inhaler use as need for shortness of breath

## 2019-10-15 NOTE — ED Triage Notes (Signed)
Pt arrives to ED with c/c of sob hx of copd denies any fever cough or CP.

## 2019-10-15 NOTE — ED Provider Notes (Signed)
[place] EMERGENCY DEPARTMENT Provider Note   CSN: [id number] Arrival date & time: 10/15/19  1649     History   Chief Complaint Chief Complaint  Patient presents with  . Shortness of Breath    HPI [name] is a 63 y.o. male with a pertinent PMH of COPD, HTN, who presents to [facility] ED with a subacute history of worsening shortness of breath from baseline.  Patient had similar symptoms a month ago and received oral steroids and abx for possible COPD exacerbation, but presented to the ED because he believed that he needed a higher dose of oral steroids. Today, he presents with increased SHOB over the last 3 days that he states is similar to his previous. He has noticed that he gets short of breath with walking. He states that the nebulizer does seem to help, but it only temporarily relieves his symptoms.  He states that whenever he begins to feel like this, oral steroids and Abx makes it better. Patient denies increased productive cough, fever or increased oxygen demand.     HPI  Past Medical History:  Diagnosis Date  . Asthma   . COPD (chronic obstructive pulmonary disease) ([place])   . Hypertension   . Shortness of breath     Patient Active Problem List   Diagnosis Date Noted  . Upper respiratory infection 08/21/2019  . Lung nodule < 6cm on CT 03/19/2019  . Uncontrolled hypertension 03/11/2019  . Primary hypertension   . Lymph node enlargement 12/10/2011  . Tobacco use disorder 08/04/2009  . Erectile dysfunction 12/31/2008  . COPD (chronic obstructive pulmonary disease) ([place]) 12/31/2008  . History of colonic polyps 01/18/2007    Past Surgical History:  Procedure Laterality Date  . COLONOSCOPY          Home Medications    Prior to Admission medications   Medication Sig Start Date End Date Taking? Authorizing Provider  acetaminophen (TYLENOL) 500 MG tablet Take 1,000 mg by mouth as needed for mild pain.   Yes [provider]   albuterol (VENTOLIN HFA) 108 (90 Base) MCG/ACT inhaler INHALE 2 PUFFS EVERY 6 HOURS AS NEEDED FOR WHEEZING OR SHORTNESS OF BREATH Patient taking differently: Inhale 2 puffs into the lungs every 6 (six) hours as needed for wheezing or shortness of breath.  10/10/19  Yes Mullis, Kiersten P, DO  amLODipine (NORVASC) 10 MG tablet Take 1 tablet (10 mg total) by mouth daily. 03/19/19  Yes [place] Bing, DO  Fluticasone-Umeclidin-Vilant (TRELEGY ELLIPTA) 100-62.5-25 MCG/INH AEPB Inhale 1 application into the lungs daily. 09/07/19  Yes Isla Pence, MD  ipratropium-albuterol (DUONEB) 0.5-2.5 (3) MG/3ML SOLN Take 3 mLs by nebulization every 6 (six) hours as needed. Patient taking differently: Take 3 mLs by nebulization every 6 (six) hours as needed (Shortness of breath; Wheezing).  08/13/19  Yes Mullis, Kiersten P, DO  loratadine (ALLERGY RELIEF) 10 MG tablet Take 10 mg by mouth daily as needed for allergies.   Yes [provider]  doxycycline (DORYX) 100 MG EC tablet Take 1 tablet (100 mg total) by mouth 2 (two) times daily for 5 days. 10/15/19 10/20/19  Marianna Payment, MD  doxycycline (VIBRA-TABS) 100 MG tablet Take 1 tablet (100 mg total) by mouth 2 (two) times daily. Patient not taking: Reported on 10/15/2019 08/21/19   Daisy Floro, DO  predniSONE (DELTASONE) 20 MG tablet Take 2 tablets (40 mg total) by mouth daily with breakfast for 5 days. 10/15/19 10/20/19  Marianna Payment, MD  predniSONE (STERAPRED UNI-PAK 21 TAB) 10 MG (21) TBPK tablet Take 6 tabs for 2 days, then 5 for 2 days, then 4 for 2 days, then 3 for 2 days, 2 for 2 days, then 1 for 2 days Patient not taking: Reported on 10/15/2019 09/07/19   Isla Pence, MD    Family History Family History  Problem Relation Age of Onset  . Cancer Father     Social History Social History   Tobacco Use  . Smoking status: Current Some Day Smoker    Packs/day: 0.50    Years: 30.00    Pack years: 15.00    Types: Cigarettes     Last attempt to quit: 08/21/2013    Years since quitting: 6.1  . Smokeless tobacco: Never Used  Substance Use Topics  . Alcohol use: Yes    Comment: rare/occasional  . Drug use: No     Allergies   Lisinopril   Review of Systems Review of Systems  Constitutional: Negative for chills and fever.  Respiratory: Positive for shortness of breath. Negative for cough, chest tightness, wheezing and stridor.   Cardiovascular: Negative for chest pain.     Physical Exam Updated Vital Signs BP (!) 127/91   Pulse 73   Temp 98.3 F (36.8 C) (Oral)   Resp 17   Ht 5\' 9"  (1.753 m)   Wt 59 kg   SpO2 99%   BMI 19.20 kg/m   Physical Exam Constitutional:      Appearance: He is well-developed.  HENT:     Head: Normocephalic and atraumatic.  Eyes:     Extraocular Movements: Extraocular movements intact.  Neck:     Musculoskeletal: Normal range of motion.  Cardiovascular:     Rate and Rhythm: Normal rate and regular rhythm.     Pulses: Normal pulses.     Heart sounds: No murmur. No friction rub. No gallop.   Pulmonary:     Effort: Pulmonary effort is normal. No tachypnea, accessory muscle usage or respiratory distress.     Breath sounds: Normal breath sounds. No decreased breath sounds, wheezing, rhonchi or rales.  Skin:    General: Skin is warm and dry.  Neurological:     General: No focal deficit present.     Mental Status: He is alert.      ED Treatments / Results  Labs (all labs ordered are listed, but only abnormal results are displayed) Labs Reviewed  CBC - Abnormal; Notable for the following components:      Result Value   MCV 102.5 (*)    All other components within normal limits  BASIC METABOLIC PANEL  TROPONIN I (HIGH SENSITIVITY)    EKG EKG Interpretation  Date/Time:  Tuesday October 15 2019 16:56:31 EST Ventricular Rate:  81 PR Interval:  132 QRS Duration: 106 QT Interval:  388 QTC Calculation: 450 R Axis:   83 Text Interpretation: Normal sinus  rhythm Minimal voltage criteria for LVH, may be normal variant ( Cornell product ) Septal infarct , age undetermined Abnormal ECG Similar to prior tracing. No STEMI Confirmed by Nanda Quinton [phone]) on 10/15/2019 9:01:44 PM   Radiology Dg Chest 2 View  Result Date: 10/15/2019 CLINICAL DATA:  Shortness of breath EXAM: CHEST - 2 VIEW COMPARISON:  None. FINDINGS: The heart size and mediastinal contours are within normal limits. Both lungs are clear. No pleural effusion. The visualized skeletal structures are unremarkable. IMPRESSION: No acute process in the chest. Electronically Signed   By: Addison Lank.D.  On: 10/15/2019 17:21    Procedures Procedures (including critical care time)  Medications Ordered in ED Medications  sodium chloride flush (NS) 0.9 % injection 3 mL (3 mLs Intravenous Not Given 10/15/19 2118)  albuterol (VENTOLIN HFA) 108 (90 Base) MCG/ACT inhaler 1-2 puff (2 puffs Inhalation Given 10/15/19 2138)  doxycycline (VIBRA-TABS) tablet 100 mg (100 mg Oral Given 10/15/19 2138)     Initial Impression / Assessment and Plan / ED Course  I have reviewed the triage vital signs and the nursing notes.  Pertinent labs & imaging results that were available during my care of the patient were reviewed by me and considered in my medical decision making (see chart for details).  Mr. Fauci is a 63 y.o. male who presents with a history of COPD and SHOB. Patient does not have a fever, leukocytosis, productive cough, increased oxygen demand, and his CXR is negative. Patient is sating at 100% on RA without respiratory distress or increased work of breathing. Patient was instructed to return to the ED if his symptoms worsen.  - Patient is stable for discharge. I will send in a 5 day course of Doxycycline 100 mg and prednisone 40 mg.  - Patient given albuterol inhaler in ED. He will be sent home with this medication.    Final Clinical Impressions(s) / ED Diagnoses   Final diagnoses:  SOB  (shortness of breath)    ED Discharge Orders         Ordered    predniSONE (DELTASONE) 20 MG tablet  Daily with breakfast     10/15/19 2133    doxycycline (DORYX) 100 MG EC tablet  2 times daily     10/15/19 2133           Marianna Payment, MD 10/15/19 2144    Margette Fast, MD 10/15/19 2228

## 2019-10-15 NOTE — ED Notes (Signed)
Patient verbalizes understanding of discharge instructions. Opportunity for questioning and answers were provided. Armband removed by staff, pt discharged from ED. Pt. ambulatory and discharged home.  

## 2019-10-15 NOTE — ED Notes (Signed)
[name] (Wife#[phone]) returned called.

## 2019-10-16 ENCOUNTER — Other Ambulatory Visit: Payer: Self-pay | Admitting: Internal Medicine

## 2019-10-16 ENCOUNTER — Telehealth: Payer: Self-pay | Admitting: *Deleted

## 2019-10-16 MED ORDER — DOXYCYCLINE HYCLATE 100 MG PO TABS: 100.0000 mg | ORAL_TABLET | Freq: Two times a day (BID) | ORAL | 0 refills | Status: AC

## 2019-10-16 NOTE — Telephone Encounter (Signed)
Pt calls because the Extended release version of doxycyline was sent in from ED yesterday.  This will cost him over $100 dollars.  He would like something cheaper called in.  Spoke with Dr. Andria Frames (Preceptor) who gives verbal for regular doxycycline 100mg  BID x 10 days.  Pharmacy and pt informed.  Christen Bame, CMA

## 2019-11-25 ENCOUNTER — Telehealth: Payer: Self-pay

## 2019-11-25 NOTE — Telephone Encounter (Signed)
Patient calls nurse line stating he needs a nebulizer machine sent to [facility]. Patient has solution, he just needs the machine. Please advise. This is not an active medication on list.

## 2019-11-26 ENCOUNTER — Other Ambulatory Visit: Payer: Self-pay

## 2019-11-26 DIAGNOSIS — I10 Essential (primary) hypertension: Secondary | ICD-10-CM

## 2019-11-27 ENCOUNTER — Other Ambulatory Visit: Payer: Self-pay | Admitting: Family Medicine

## 2019-11-27 DIAGNOSIS — J449 Chronic obstructive pulmonary disease, unspecified: Secondary | ICD-10-CM

## 2019-11-27 MED ORDER — AMLODIPINE BESYLATE 10 MG PO TABS: 10.0000 mg | ORAL_TABLET | Freq: Every day | ORAL | 3 refills | Status: DC

## 2019-11-27 NOTE — Telephone Encounter (Signed)
DME order placed for nebulizer

## 2019-11-27 NOTE — Telephone Encounter (Signed)
Please have patient make annual wellness visit for check up and blood pressure follow up at earliest convenience. Thank you.

## 2019-11-28 ENCOUNTER — Other Ambulatory Visit: Payer: Self-pay | Admitting: *Deleted

## 2019-11-28 DIAGNOSIS — I10 Essential (primary) hypertension: Secondary | ICD-10-CM

## 2019-11-28 MED ORDER — AMLODIPINE BESYLATE 10 MG PO TABS: 10.0000 mg | ORAL_TABLET | Freq: Every day | ORAL | 3 refills | Status: DC

## 2019-12-19 NOTE — Telephone Encounter (Signed)
[employer] calls to check status.  Ask that we fax order to [phone].  Done as requested. Christen Bame, CMA

## 2019-12-28 ENCOUNTER — Other Ambulatory Visit: Payer: Self-pay | Admitting: Family Medicine

## 2019-12-28 DIAGNOSIS — J449 Chronic obstructive pulmonary disease, unspecified: Secondary | ICD-10-CM

## 2020-01-10 ENCOUNTER — Other Ambulatory Visit: Payer: Self-pay | Admitting: Family Medicine

## 2020-01-10 DIAGNOSIS — J449 Chronic obstructive pulmonary disease, unspecified: Secondary | ICD-10-CM

## 2020-01-27 ENCOUNTER — Other Ambulatory Visit: Payer: Self-pay

## 2020-01-27 DIAGNOSIS — J449 Chronic obstructive pulmonary disease, unspecified: Secondary | ICD-10-CM

## 2020-01-27 MED ORDER — TRELEGY ELLIPTA 100-62.5-25 MCG/INH IN AEPB: 1.0000 "application " | INHALATION_SPRAY | Freq: Every day | RESPIRATORY_TRACT | 3 refills | Status: DC

## 2020-01-30 ENCOUNTER — Other Ambulatory Visit: Payer: Self-pay

## 2020-01-30 ENCOUNTER — Encounter (HOSPITAL_COMMUNITY): Payer: Self-pay | Admitting: Pediatrics

## 2020-01-30 ENCOUNTER — Emergency Department (HOSPITAL_COMMUNITY)
Admission: EM | Admit: 2020-01-30 | Discharge: 2020-01-30 | Disposition: A | Discharge status: Home / Self Care | Payer: Medicare HMO | Attending: Emergency Medicine | Admitting: Emergency Medicine

## 2020-01-30 ENCOUNTER — Emergency Department (HOSPITAL_COMMUNITY): Payer: Medicare HMO

## 2020-01-30 DIAGNOSIS — R0602 Shortness of breath: Secondary | ICD-10-CM | POA: Diagnosis not present

## 2020-01-30 DIAGNOSIS — J441 Chronic obstructive pulmonary disease with (acute) exacerbation: Secondary | ICD-10-CM | POA: Diagnosis not present

## 2020-01-30 DIAGNOSIS — Z720 Tobacco use: Secondary | ICD-10-CM | POA: Insufficient documentation

## 2020-01-30 DIAGNOSIS — Z79899 Other long term (current) drug therapy: Secondary | ICD-10-CM | POA: Insufficient documentation

## 2020-01-30 DIAGNOSIS — I10 Essential (primary) hypertension: Secondary | ICD-10-CM | POA: Diagnosis not present

## 2020-01-30 DIAGNOSIS — J449 Chronic obstructive pulmonary disease, unspecified: Secondary | ICD-10-CM

## 2020-01-30 LAB — BASIC METABOLIC PANEL
Anion gap: 9 (ref 5–15)
BUN: 24 mg/dL — ABNORMAL HIGH (ref 8–23)
CO2: 22 mmol/L (ref 22–32)
Calcium: 8.8 mg/dL — ABNORMAL LOW (ref 8.9–10.3)
Chloride: 113 mmol/L — ABNORMAL HIGH (ref 98–111)
Creatinine, Ser: 1.62 mg/dL — ABNORMAL HIGH (ref 0.61–1.24)
GFR calc Af Amer: 51 mL/min — ABNORMAL LOW (ref >60)
GFR calc non Af Amer: 44 mL/min — ABNORMAL LOW (ref >60)
Glucose, Bld: 64 mg/dL — ABNORMAL LOW (ref 70–99)
Potassium: 4.2 mmol/L (ref 3.5–5.1)
Sodium: 144 mmol/L (ref 135–145)

## 2020-01-30 LAB — CBC
HCT: 41.6 % (ref 39.0–52.0)
Hemoglobin: 13.6 g/dL (ref 13.0–17.0)
MCH: 33.2 pg (ref 26.0–34.0)
MCHC: 32.7 g/dL (ref 30.0–36.0)
MCV: 101.5 fL — ABNORMAL HIGH (ref 80.0–100.0)
Platelets: 297 10*3/uL (ref 150–400)
RBC: 4.1 MIL/uL — ABNORMAL LOW (ref 4.22–5.81)
RDW: 12 % (ref 11.5–15.5)
WBC: 5.6 10*3/uL (ref 4.0–10.5)
nRBC: 0 % (ref 0.0–0.2)

## 2020-01-30 LAB — CBG MONITORING, ED: Glucose-Capillary: 83 mg/dL (ref 70–99)

## 2020-01-30 MED ORDER — ALBUTEROL SULFATE HFA 108 (90 BASE) MCG/ACT IN AERS
4.0000 | INHALATION_SPRAY | Freq: Once | RESPIRATORY_TRACT | Status: AC
  Administered 2020-01-30: 4 via RESPIRATORY_TRACT
  Filled 2020-01-30: qty 6.7

## 2020-01-30 MED ORDER — PREDNISONE 20 MG PO TABS: 40.0000 mg | ORAL_TABLET | Freq: Every day | ORAL | 0 refills | Status: AC

## 2020-01-30 MED ORDER — IPRATROPIUM BROMIDE HFA 17 MCG/ACT IN AERS
2.0000 | INHALATION_SPRAY | Freq: Once | RESPIRATORY_TRACT | Status: AC
  Administered 2020-01-30: 21:00:00 2 via RESPIRATORY_TRACT
  Filled 2020-01-30: qty 12.9

## 2020-01-30 MED ORDER — PREDNISONE 20 MG PO TABS
40.0000 mg | ORAL_TABLET | Freq: Once | ORAL | Status: AC
  Administered 2020-01-30: 40 mg via ORAL
  Filled 2020-01-30: qty 2

## 2020-01-30 MED ORDER — UMECLIDINIUM BROMIDE 62.5 MCG/INH IN AEPB
1.0000 | INHALATION_SPRAY | Freq: Every day | RESPIRATORY_TRACT | Status: DC
  Administered 2020-01-30: 1 via RESPIRATORY_TRACT
  Filled 2020-01-30: qty 7

## 2020-01-30 MED ORDER — UMECLIDINIUM BROMIDE 62.5 MCG/INH IN AEPB
1.0000 | INHALATION_SPRAY | Freq: Every day | RESPIRATORY_TRACT | Status: DC
  Filled 2020-01-30: qty 7

## 2020-01-30 MED ORDER — FLUTICASONE FUROATE-VILANTEROL 100-25 MCG/INH IN AEPB
1.0000 | INHALATION_SPRAY | Freq: Every day | RESPIRATORY_TRACT | Status: DC
  Administered 2020-01-30: 1 via RESPIRATORY_TRACT
  Filled 2020-01-30: qty 28

## 2020-01-30 NOTE — ED Provider Notes (Signed)
[facility] [place] EMERGENCY DEPARTMENT Provider Note   CSN: [id number] Arrival date & time: 01/30/20  [place]     History Chief Complaint  Patient presents with  . Shortness of Breath   [name] is a 64 y.o. male.  Presents to ER with concern for shortness of breath.  Patient states that he chronically feels short of breath because of his COPD but has been worsening over the last few days.  No associated chest pain.  Has chronic cough, no changes in cough.  No recent fevers.  States is similar to prior issues with COPD.  Patient reports that recently ran out of his albuterol inhaler.  Also has been having issues getting his trilegey inhaler.   HPI     Past Medical History:  Diagnosis Date  . Asthma   . COPD (chronic obstructive pulmonary disease) ([place])   . Hypertension   . Shortness of breath     Patient Active Problem List   Diagnosis Date Noted  . Lung nodule < 6cm on CT 03/19/2019  . Uncontrolled hypertension 03/11/2019  . Primary hypertension   . Tobacco use disorder 08/04/2009  . Erectile dysfunction 12/31/2008  . COPD (chronic obstructive pulmonary disease) ([place]) 12/31/2008  . History of colonic polyps 01/18/2007    Past Surgical History:  Procedure Laterality Date  . COLONOSCOPY         Family History  Problem Relation Age of Onset  . Cancer Father     Social History   Tobacco Use  . Smoking status: Current Some Day Smoker    Packs/day: 0.50    Years: 30.00    Pack years: 15.00    Types: Cigarettes    Last attempt to quit: 08/21/2013    Years since quitting: 6.4  . Smokeless tobacco: Never Used  Substance Use Topics  . Alcohol use: Yes    Comment: rare/occasional  . Drug use: No    Home Medications Prior to Admission medications   Medication Sig Start Date End Date Taking? Authorizing Provider  acetaminophen (TYLENOL) 500 MG tablet Take 1,000 mg by mouth as needed for mild pain.   Yes [provider]  albuterol  (VENTOLIN HFA) 108 (90 Base) MCG/ACT inhaler Inhale 2 puffs into the lungs every 6 (six) hours as needed for wheezing or shortness of breath. 12/30/19  Yes Mullis, Kiersten P, DO  amLODipine (NORVASC) 10 MG tablet Take 1 tablet (10 mg total) by mouth daily. 11/28/19  Yes Mullis, Kiersten P, DO  Fluticasone-Umeclidin-Vilant (TRELEGY ELLIPTA) 100-62.5-25 MCG/INH AEPB Inhale 1 application into the lungs daily. 01/27/20  Yes Mullis, Kiersten P, DO  ipratropium-albuterol (DUONEB) 0.5-2.5 (3) MG/3ML SOLN Take 3 mLs by nebulization every 6 (six) hours as needed (Shortness of breath; Wheezing). 01/10/20  Yes Mullis, Kiersten P, DO  loratadine (ALLERGY RELIEF) 10 MG tablet Take 10 mg by mouth daily as needed for allergies.   Yes [provider]    Allergies    Lisinopril  Review of Systems   Review of Systems  Constitutional: Negative for chills and fever.  HENT: Negative for ear pain and sore throat.   Eyes: Negative for pain and visual disturbance.  Respiratory: Positive for cough and shortness of breath.   Cardiovascular: Negative for chest pain and palpitations.  Gastrointestinal: Negative for abdominal pain and vomiting.  Genitourinary: Negative for dysuria and hematuria.  Musculoskeletal: Negative for arthralgias and back pain.  Skin: Negative for color change and rash.  Neurological: Negative for seizures and  syncope.  All other systems reviewed and are negative.   Physical Exam Updated Vital Signs BP (!) 187/92 (BP Location: Right Arm)   Pulse 84   Temp 97.9 F (36.6 C) (Oral)   Resp 17   Ht 5\' 9"  (1.753 m)   Wt 65.8 kg   SpO2 97%   BMI 21.41 kg/m   Physical Exam Vitals and nursing note reviewed.  Constitutional:      Appearance: He is well-developed.  HENT:     Head: Normocephalic and atraumatic.  Eyes:     Conjunctiva/sclera: Conjunctivae normal.  Cardiovascular:     Rate and Rhythm: Normal rate and regular rhythm.     Heart sounds: No murmur.  Pulmonary:      Effort: Pulmonary effort is normal. No respiratory distress.     Breath sounds: Normal breath sounds.     Comments: Bilateral expiratory wheeze, prolonged expiratory phase, speaking full sentence, no respiratory distress Abdominal:     Palpations: Abdomen is soft.     Tenderness: There is no abdominal tenderness.  Musculoskeletal:     Cervical back: Neck supple.     Right lower leg: No edema.     Left lower leg: No edema.  Skin:    General: Skin is warm and dry.     Capillary Refill: Capillary refill takes less than 2 seconds.  Neurological:     General: No focal deficit present.     Mental Status: He is alert and oriented to person, place, and time.  Psychiatric:        Mood and Affect: Mood normal.        Behavior: Behavior normal.     ED Results / Procedures / Treatments   Labs (all labs ordered are listed, but only abnormal results are displayed) Labs Reviewed  BASIC METABOLIC PANEL - Abnormal; Notable for the following components:      Result Value   Chloride 113 (*)    Glucose, Bld 64 (*)    BUN 24 (*)    Creatinine, Ser 1.62 (*)    Calcium 8.8 (*)    GFR calc non Af Amer 44 (*)    GFR calc Af Amer 51 (*)    All other components within normal limits  CBC - Abnormal; Notable for the following components:   RBC 4.10 (*)    MCV 101.5 (*)    All other components within normal limits  CBG MONITORING, ED    EKG None  Radiology DG Chest 2 View  Result Date: 01/30/2020 CLINICAL DATA:  64 year old male with shortness of breath.  COPD. EXAM: CHEST - 2 VIEW COMPARISON:  Chest radiographs 10/15/2019 and earlier. FINDINGS: Stable large lung volumes. Mediastinal contours remain normal. Visualized tracheal air column is within normal limits. Stable mildly increased perihilar interstitial lung markings. Elsewhere the lungs appear clear. No pneumothorax or pleural effusion. Negative visible bowel gas and osseous structures. IMPRESSION: Stable pulmonary hyperinflation. No acute  cardiopulmonary abnormality. Electronically Signed   By: Genevie Ann M.D.   On: 01/30/2020 21:54    Procedures Procedures (including critical care time)  Medications Ordered in ED Medications  fluticasone furoate-vilanterol (BREO ELLIPTA) 100-25 MCG/INH 1 puff (1 puff Inhalation Not Given 01/30/20 2135)  umeclidinium bromide (INCRUSE ELLIPTA) 62.5 MCG/INH 1 puff (has no administration in time range)  predniSONE (DELTASONE) tablet 40 mg (40 mg Oral Given 01/30/20 2130)  albuterol (VENTOLIN HFA) 108 (90 Base) MCG/ACT inhaler 4 puff (4 puffs Inhalation Given 01/30/20 2128)  ipratropium (ATROVENT  HFA) inhaler 2 puff (2 puffs Inhalation Given 01/30/20 2128)    ED Course  I have reviewed the triage vital signs and the nursing notes.  Pertinent labs & imaging results that were available during my care of the patient were reviewed by me and considered in my medical decision making (see chart for details).    MDM Rules/Calculators/A&P                      64 year old male who presented to ER with complaints of shortness of breath.  No respiratory distress, no respiratory, no respiratory distress noted wheezing on exam.  Suspect COPD exacerbation. Provided patient with albuterol, prednisone, on reassessment significant improvement.  Provided patient with controlling inhaler as well.  Stressed need for close follow-up with his primary doctor.  Vital signs stable, no oxygen requirement, believe he is appropriate for outpatient management at this time.  Reviewed precautions, will discharge home.    After the discussed management above, the patient was determined to be safe for discharge.  The patient was in agreement with this plan and all questions regarding their care were answered.  ED return precautions were discussed and the patient will return to the ED with any significant worsening of condition.   Final Clinical Impression(s) / ED Diagnoses Final diagnoses:  Chronic obstructive pulmonary disease,  unspecified COPD type [facility])    Rx / DC Orders ED Discharge Orders    None       Lucrezia Starch, MD 01/30/20 2322

## 2020-01-30 NOTE — ED Triage Notes (Signed)
C/o COPD exacerbation; reported he has ran out of meds x 4 days and has been having difficulty breathing.

## 2020-01-30 NOTE — ED Notes (Signed)
RT called to administer medications per MD. Patient cannot be d/c'd until meds given.

## 2020-01-30 NOTE — ED Notes (Addendum)
Enter pt. Room and pt. was sitting on the side of bed displaying SOB, O2 stats were 92%. Nurse applied 2L of oxygen.

## 2020-01-30 NOTE — Discharge Instructions (Addendum)
Please take steroids as prescribed for flareup of your COPD.  Recommend taking albuterol as needed for further wheezing.  Recommend continuing your Trelegy once that has been obtained.  In the interim, you can use the Incruse Ellipta and Breo Ellipta once daily.

## 2020-02-14 ENCOUNTER — Telehealth (INDEPENDENT_AMBULATORY_CARE_PROVIDER_SITE_OTHER): Payer: Medicare HMO | Admitting: Family Medicine

## 2020-02-14 DIAGNOSIS — J441 Chronic obstructive pulmonary disease with (acute) exacerbation: Secondary | ICD-10-CM

## 2020-02-14 MED ORDER — PREDNISONE 20 MG PO TABS: 40.0000 mg | ORAL_TABLET | Freq: Every day | ORAL | 0 refills | Status: AC

## 2020-02-14 MED ORDER — LEVOFLOXACIN 500 MG PO TABS: 500.0000 mg | ORAL_TABLET | Freq: Every day | ORAL | 0 refills | Status: AC

## 2020-02-14 NOTE — Progress Notes (Signed)
[place] Telemedicine Visit  Patient consented to have virtual visit. Method of visit: Telephone  Encounter participants: Patient: [name] - located at Home Provider: Danna Hefty - located at [facility] Others (if applicable): Wife  Chief Complaint: COPD issues  HPI: Patient notes he has been having worsening SOB x 3 days. He notes he is having difficulty breathing when walking from the kitchen to the living room. Notes he has been using the Trelegy QD and Albuterol very often. He is also using the duonebs regularly over the last few days. He notes he normally uses the nebulizer or Albuterol every 6-8 hours on a regular basis. He notes he is using the nebulizer now about 10-12 times/day. He does endorse worsening cough that is productive. When he is able to bring up phlegm his symptoms improve for a short time. Denies any fevers or chills. Denies any exposure to COVID-19.  Wife notes he is having to work hard to breath and is wheezing.  They do not have a pulse oximeter at home. Endorses he has many COPD exacerbations. He is uncertain of any exposures that may have set him off for this exacerbation, however dose not his allergies have been acting up.  He notes he is no longer smoking cigarettes.  Denies any exposure to smoke or other toxins.  Patient and wife note that they would really like to avoid the emergency department as they often have to wait a very long time.  ROS: per HPI  Pertinent PMHx: COPD  Exam:  Respiratory: SOB with talking during encounter  Assessment/Plan:  COPD exacerbation ([place]) Symptoms appear most consistent with a COPD exacerbation given worsening dyspnea,  productive cough, and history of frequent CODP exacerbations.  Considered other etiologies such as pneumonia or COVID-19, however given lack of other symptoms I will treat as COPD exacerbation at this time.  Unclear of what has set patient into an exacerbation.  Patient is  obviously dyspneic with speaking during phone encounter, which is concerning.  Given reluctance to go to the ED, will treat patient with steroids and antibiotics with strict return precautions if no improvement. Given number of COPD exacerbations in the past will broaden antibiotic. - Prednisone 40mg  x 7 days - Levofloxacin 500mg  QD x 10 days - Recommended purchasing a pulse ox for future given frquency of exacerbations - Continue Duonebs q 4 hours and Trelegy  - If no improvement in 24-48 hours or worsening, patient was instructed to go to the ED immediately.  Wife and patient voiced understanding and agreement to plan.  Time spent during visit with patient: 25 minutes  Mina Marble, [place], PGY2 02/14/20

## 2020-02-16 NOTE — Assessment & Plan Note (Addendum)
Symptoms appear most consistent with a COPD exacerbation given worsening dyspnea,  productive cough, and history of frequent CODP exacerbations.  Considered other etiologies such as pneumonia or COVID-19, however given lack of other symptoms I will treat as COPD exacerbation at this time.  Unclear of what has set patient into an exacerbation.  Patient is obviously dyspneic with speaking during phone encounter, which is concerning.  Given reluctance to go to the ED, will treat patient with steroids and antibiotics with strict return precautions if no improvement. Given number of COPD exacerbations in the past will broaden antibiotic. - Prednisone 40mg  x 7 days - Levofloxacin 500mg  QD x 10 days - Recommended purchasing a pulse ox for future given frquency of exacerbations - Continue Duonebs q 4 hours and Trelegy  - If no improvement in 24-48 hours or worsening, patient was instructed to go to the ED immediately.  Wife and patient voiced understanding and agreement to plan.

## 2020-02-21 ENCOUNTER — Ambulatory Visit: Payer: Medicare HMO

## 2020-02-27 DIAGNOSIS — J449 Chronic obstructive pulmonary disease, unspecified: Secondary | ICD-10-CM | POA: Diagnosis not present

## 2020-02-28 ENCOUNTER — Other Ambulatory Visit: Payer: Self-pay | Admitting: Family Medicine

## 2020-02-28 DIAGNOSIS — J449 Chronic obstructive pulmonary disease, unspecified: Secondary | ICD-10-CM

## 2020-03-05 ENCOUNTER — Ambulatory Visit: Payer: Medicare HMO | Attending: Internal Medicine

## 2020-03-05 DIAGNOSIS — Z23 Encounter for immunization: Secondary | ICD-10-CM

## 2020-03-05 NOTE — Progress Notes (Signed)
   Covid-19 Vaccination Clinic  Name:  [name]    MRN: [id number] DOB: [birth year]  03/05/2020  [name] was observed post Covid-19 immunization for 15 minutes without incident. He was provided with Vaccine Information Sheet and instruction to access the V-Safe system.   [name] was instructed to call 911 with any severe reactions post vaccine: Marland Kitchen Difficulty breathing  . Swelling of face and throat  . A fast heartbeat  . A bad rash all over body  . Dizziness and weakness   Immunizations Administered    Name Date Dose VIS Date Route   Pfizer COVID-19 Vaccine 03/05/2020  8:17 AM 0.3 mL 11/01/2019 Intramuscular   Manufacturer: [place]   Lot: [id number]   [place]: [id number]

## 2020-03-06 ENCOUNTER — Telehealth: Payer: Self-pay

## 2020-03-06 NOTE — Telephone Encounter (Signed)
Patient LVM on nurse line requesting a steroid for shallow breathing. Patient stated he received the covid vaccine yesterday, however was having SOB before. Patient stated he doesn't think he can wait until next week to be seen.   I called the patient back and LVM stating he should be evaluated in the ED or an urgent care for breathing concerns.

## 2020-03-09 ENCOUNTER — Ambulatory Visit: Payer: Medicare HMO

## 2020-03-28 DIAGNOSIS — J449 Chronic obstructive pulmonary disease, unspecified: Secondary | ICD-10-CM | POA: Diagnosis not present

## 2020-03-30 ENCOUNTER — Ambulatory Visit: Payer: Medicare HMO | Attending: Internal Medicine

## 2020-03-30 DIAGNOSIS — Z23 Encounter for immunization: Secondary | ICD-10-CM

## 2020-03-30 NOTE — Progress Notes (Signed)
   Covid-19 Vaccination Clinic  Name:  [name]    MRN: [id number] DOB: [birth year]  03/30/2020  [name] was observed post Covid-19 immunization for 15 minutes without incident. He was provided with Vaccine Information Sheet and instruction to access the V-Safe system.   [name] was instructed to call 911 with any severe reactions post vaccine: Marland Kitchen Difficulty breathing  . Swelling of face and throat  . A fast heartbeat  . A bad rash all over body  . Dizziness and weakness   Immunizations Administered    Name Date Dose VIS Date Route   Pfizer COVID-19 Vaccine 03/30/2020  4:15 PM 0.3 mL 01/15/2019 Intramuscular   Manufacturer: [place]   Lot: [id number]   [place]: [id number]

## 2020-04-02 ENCOUNTER — Telehealth: Payer: Self-pay

## 2020-04-02 NOTE — Telephone Encounter (Signed)
Received fax from pharmacy, PA needed on Ipratropium-Albuterol. Clinical questions placed in providers box for review.  Cover My Meds info: Key: [id number]

## 2020-04-16 NOTE — Telephone Encounter (Signed)
Forms have been completed and faxed.

## 2020-04-20 ENCOUNTER — Ambulatory Visit (INDEPENDENT_AMBULATORY_CARE_PROVIDER_SITE_OTHER): Payer: Medicare HMO

## 2020-04-20 ENCOUNTER — Other Ambulatory Visit: Payer: Self-pay

## 2020-04-20 ENCOUNTER — Encounter (HOSPITAL_COMMUNITY): Payer: Self-pay

## 2020-04-20 ENCOUNTER — Ambulatory Visit (HOSPITAL_COMMUNITY)
Admission: EM | Admit: 2020-04-20 | Discharge: 2020-04-20 | Disposition: A | Discharge status: Home / Self Care | Payer: Medicare HMO | Attending: Family Medicine | Admitting: Family Medicine

## 2020-04-20 DIAGNOSIS — Z20822 Contact with and (suspected) exposure to covid-19: Secondary | ICD-10-CM | POA: Insufficient documentation

## 2020-04-20 DIAGNOSIS — Z888 Allergy status to other drugs, medicaments and biological substances status: Secondary | ICD-10-CM | POA: Diagnosis not present

## 2020-04-20 DIAGNOSIS — Z86010 Personal history of colonic polyps: Secondary | ICD-10-CM | POA: Insufficient documentation

## 2020-04-20 DIAGNOSIS — I10 Essential (primary) hypertension: Secondary | ICD-10-CM | POA: Insufficient documentation

## 2020-04-20 DIAGNOSIS — N529 Male erectile dysfunction, unspecified: Secondary | ICD-10-CM | POA: Insufficient documentation

## 2020-04-20 DIAGNOSIS — F17210 Nicotine dependence, cigarettes, uncomplicated: Secondary | ICD-10-CM | POA: Insufficient documentation

## 2020-04-20 DIAGNOSIS — R0602 Shortness of breath: Secondary | ICD-10-CM

## 2020-04-20 DIAGNOSIS — J449 Chronic obstructive pulmonary disease, unspecified: Secondary | ICD-10-CM | POA: Diagnosis not present

## 2020-04-20 DIAGNOSIS — Z79899 Other long term (current) drug therapy: Secondary | ICD-10-CM | POA: Diagnosis not present

## 2020-04-20 DIAGNOSIS — R911 Solitary pulmonary nodule: Secondary | ICD-10-CM | POA: Insufficient documentation

## 2020-04-20 DIAGNOSIS — J441 Chronic obstructive pulmonary disease with (acute) exacerbation: Secondary | ICD-10-CM | POA: Diagnosis not present

## 2020-04-20 DIAGNOSIS — Z7952 Long term (current) use of systemic steroids: Secondary | ICD-10-CM | POA: Diagnosis not present

## 2020-04-20 DIAGNOSIS — R05 Cough: Secondary | ICD-10-CM

## 2020-04-20 MED ORDER — PREDNISONE 20 MG PO TABS: 40.0000 mg | ORAL_TABLET | Freq: Every day | ORAL | 0 refills | Status: AC

## 2020-04-20 MED ORDER — AZITHROMYCIN 250 MG PO TABS: ORAL_TABLET | ORAL | 0 refills | Status: AC

## 2020-04-20 NOTE — ED Provider Notes (Signed)
[place]    CSN: [id number] Arrival date & time: 04/20/20  1522      History   Chief Complaint Chief Complaint  Patient presents with  . Shortness of Breath    HPI [name] is a 64 y.o. male.   Patient history of COPD presents for 3-day history of shortness of breath, increased cough with clear sputum and feeling wheezing.  He reports this is very typical for his COPD exacerbation.  He reports he has been using his inhalers at home without much improvement.  He reports he typically responds well to steroids antibiotics for his COPD flares.  He denies chest pain, nausea, sweating or abdominal pain.  He reports he has been using his trilogy inhaler at home and his albuterol inhalers as needed.  He reports he has had mostly clear productive sputum with occasional green tinge to it.  He reports this is an increase in his baseline sputum production.     Past Medical History:  Diagnosis Date  . Asthma   . COPD (chronic obstructive pulmonary disease) ([place])   . Hypertension   . Shortness of breath     Patient Active Problem List   Diagnosis Date Noted  . Lung nodule < 6cm on CT 03/19/2019  . Uncontrolled hypertension 03/11/2019  . Primary hypertension   . Tobacco use disorder 08/04/2009  . Erectile dysfunction 12/31/2008  . COPD (chronic obstructive pulmonary disease) ([place]) 12/31/2008  . History of colonic polyps 01/18/2007    Past Surgical History:  Procedure Laterality Date  . COLONOSCOPY         Home Medications    Prior to Admission medications   Medication Sig Start Date End Date Taking? Authorizing Provider  acetaminophen (TYLENOL) 500 MG tablet Take 1,000 mg by mouth as needed for mild pain.    [provider]  albuterol (VENTOLIN HFA) 108 (90 Base) MCG/ACT inhaler Inhale 2 puffs into the lungs every 6 (six) hours as needed for wheezing or shortness of breath. 12/30/19   Mullis, Kiersten P, DO  amLODipine (NORVASC) 10 MG tablet Take  1 tablet (10 mg total) by mouth daily. 11/28/19   Mullis, Kiersten P, DO  azithromycin (ZITHROMAX Z-PAK) 250 MG tablet Take 2 tablets (500 mg total) by mouth daily for 1 day, THEN 1 tablet (250 mg total) daily for 4 days. 04/20/20 04/25/20  Lillion Elbert, Marguerita Beards, PA-C  Fluticasone-Umeclidin-Vilant (TRELEGY ELLIPTA) 100-62.5-25 MCG/INH AEPB Inhale 1 application into the lungs daily. 01/27/20   Mullis, Kiersten P, DO  ipratropium-albuterol (DUONEB) 0.5-2.5 (3) MG/3ML SOLN INHALE THE CONTENTS OF 1 VIAL VIA NEBULIZER EVERY 6 HOURS AS NEEDED FOR SHORTNESS OF BREATH / WHEEZING 02/29/20   Mullis, Kiersten P, DO  loratadine (ALLERGY RELIEF) 10 MG tablet Take 10 mg by mouth daily as needed for allergies.    [provider]  predniSONE (DELTASONE) 20 MG tablet Take 2 tablets (40 mg total) by mouth daily with breakfast for 5 days. 04/20/20 04/25/20  Joanann Mies, Marguerita Beards, PA-C    Family History Family History  Problem Relation Age of Onset  . Cancer Father     Social History Social History   Tobacco Use  . Smoking status: Current Some Day Smoker    Packs/day: 0.50    Years: 30.00    Pack years: 15.00    Types: Cigarettes    Last attempt to quit: 08/21/2013    Years since quitting: 6.6  . Smokeless tobacco: Never Used  Substance Use Topics  .  Alcohol use: Yes    Comment: rare/occasional  . Drug use: No     Allergies   Lisinopril   Review of Systems Review of Systems   Physical Exam Triage Vital Signs ED Triage Vitals [04/20/20 1601]  Enc Vitals Group     BP (!) 165/88     Pulse Rate 81     Resp 16     Temp 98.6 F (37 C)     Temp Source Oral     SpO2 100 %     Weight 130 lb (59 kg)     Height 5\' 9"  (1.753 m)     Head Circumference      Peak Flow      Pain Score 0     Pain Loc      Pain Edu?      Excl. in [place]?    No data found.  Updated Vital Signs BP (!) 165/88   Pulse 81   Temp 98.6 F (37 C) (Oral)   Resp 16   Ht 5\' 9"  (1.753 m)   Wt 130 lb (59 kg)   SpO2 100%   BMI 19.20  kg/m   Visual Acuity Right Eye Distance:   Left Eye Distance:   Bilateral Distance:    Right Eye Near:   Left Eye Near:    Bilateral Near:     Physical Exam Vitals and nursing note reviewed.  Constitutional:      General: He is not in acute distress.    Appearance: He is well-developed. He is not ill-appearing, toxic-appearing or diaphoretic.  HENT:     Head: Normocephalic and atraumatic.  Eyes:     Conjunctiva/sclera: Conjunctivae normal.  Cardiovascular:     Rate and Rhythm: Normal rate and regular rhythm.     Heart sounds: No murmur.  Pulmonary:     Effort: Pulmonary effort is normal. No tachypnea, accessory muscle usage or respiratory distress.     Breath sounds: Normal breath sounds.     Comments: Lung sounds are distant in all fields.  There are mild inspiratory wheezes appreciated.  No crackles or rhonchi appreciated  Patient is speaking in full sentences however there is occasional pressured speech with shortness of breath Abdominal:     Palpations: Abdomen is soft.     Tenderness: There is no abdominal tenderness.  Musculoskeletal:     Cervical back: Neck supple.     Right lower leg: No edema.     Left lower leg: No edema.  Skin:    General: Skin is warm and dry.  Neurological:     Mental Status: He is alert.      UC Treatments / Results  Labs (all labs ordered are listed, but only abnormal results are displayed) Labs Reviewed  SARS CORONAVIRUS 2 (TAT 6-24 HRS)    EKG   Radiology DG Chest 2 View  Result Date: 04/20/2020 CLINICAL DATA:  Productive cough and shortness of breath.  COPD. EXAM: CHEST - 2 VIEW COMPARISON:  01/30/2020 FINDINGS: Chronic hyperinflation, unchanged. Chronic mild central bronchial thickening.The cardiomediastinal contours are normal. Pulmonary vasculature is normal. No consolidation, pleural effusion, or pneumothorax. No acute osseous abnormalities are seen. IMPRESSION: Stable hyperinflation and bronchial thickening. No acute  abnormality. Electronically Signed   By: Keith Rake M.D.   On: 04/20/2020 16:46    Procedures Procedures (including critical care time)  Medications Ordered in UC Medications - No data to display  Initial Impression / Assessment and Plan / UC Course  I have reviewed the triage vital signs and the nursing notes.  Pertinent labs & imaging results that were available during my care of the patient were reviewed by me and considered in my medical decision making (see chart for details).     #COPD exacerbation Patient 64 year old with history of COPD presenting with acute exacerbation.  Chest x-ray negative for pneumonia.  He is saturating well and does not appear in distress and is afebrile.  Patient was seen in March by his primary for similar and placed on prednisone and levofloxacin.  Given he does have increased sputum production and cough with shortness of breath, will do prednisone antibiotic.  Discussed case with Dr. Meda Coffee we felt, best to start with azithromycin and follow closely with primary care.  Will recommend DuoNeb every 4 hours while awake for the next 24 hours.  Strict emergency department and return precautions were discussed.  Patient to follow-up with primary care in about 3 days for reevaluation.  Covid PCR was sent, though patient completed vaccine series in early May.  He verbalized understanding the plan of care. Final Clinical Impressions(s) / UC Diagnoses   Final diagnoses:  COPD exacerbation ([place])     Discharge Instructions     Take the prednisone daily Take 2 tablets of Zithromax today and then 1 each day for the next 4 days Continue use all your inhalers  Use your DuoNeb every 4 hours while awake, and use your albuterol as needed for shortness of breath  Schedule follow-up with your primary care provider in 2 to 3 days for reevaluation  If you become worsening short of breath, high fever or chest pain please go to the emergency department  If your  Covid-19 test is positive, you will receive a phone call from [facility] regarding your results. Negative test results are not called. Both positive and negative results area always visible on MyChart. If you do not have a MyChart account, sign up instructions are in your discharge papers.   Persons who are directed to care for themselves at home may discontinue isolation under the following conditions:  . At least 10 days have passed since symptom onset and . At least 24 hours have passed without running a fever (this means without the use of fever-reducing medications) and . Other symptoms have improved.  Persons infected with COVID-19 who never develop symptoms may discontinue isolation and other precautions 10 days after the date of their first positive COVID-19 test.     ED Prescriptions    Medication Sig Dispense Auth. Provider   predniSONE (DELTASONE) 20 MG tablet Take 2 tablets (40 mg total) by mouth daily with breakfast for 5 days. 10 tablet Quanesha Klimaszewski, Marguerita Beards, PA-C   azithromycin (ZITHROMAX Z-PAK) 250 MG tablet Take 2 tablets (500 mg total) by mouth daily for 1 day, THEN 1 tablet (250 mg total) daily for 4 days. 6 tablet Sheetal Lyall, Marguerita Beards, PA-C     PDMP not reviewed this encounter.   Purnell Shoemaker, PA-C 04/20/20 1707

## 2020-04-20 NOTE — ED Triage Notes (Signed)
Pt c/o SOB, productive cough w/clear phlegmx3 days. Pt has non labored breathing. PT has expiratory wheezes in bases of lungs.

## 2020-04-20 NOTE — Discharge Instructions (Addendum)
Take the prednisone daily Take 2 tablets of Zithromax today and then 1 each day for the next 4 days Continue use all your inhalers  Use your DuoNeb every 4 hours while awake, and use your albuterol as needed for shortness of breath  Schedule follow-up with your primary care provider in 2 to 3 days for reevaluation  If you become worsening short of breath, high fever or chest pain please go to the emergency department  If your Covid-19 test is positive, you will receive a phone call from [facility] regarding your results. Negative test results are not called. Both positive and negative results area always visible on MyChart. If you do not have a MyChart account, sign up instructions are in your discharge papers.   Persons who are directed to care for themselves at home may discontinue isolation under the following conditions:   At least 10 days have passed since symptom onset and  At least 24 hours have passed without running a fever (this means without the use of fever-reducing medications) and  Other symptoms have improved.  Persons infected with COVID-19 who never develop symptoms may discontinue isolation and other precautions 10 days after the date of their first positive COVID-19 test.

## 2020-04-21 LAB — SARS CORONAVIRUS 2 (TAT 6-24 HRS): SARS Coronavirus 2: NEGATIVE

## 2020-04-28 DIAGNOSIS — J449 Chronic obstructive pulmonary disease, unspecified: Secondary | ICD-10-CM | POA: Diagnosis not present

## 2020-04-29 ENCOUNTER — Other Ambulatory Visit: Payer: Self-pay

## 2020-04-29 ENCOUNTER — Ambulatory Visit (HOSPITAL_COMMUNITY)
Admission: EM | Admit: 2020-04-29 | Discharge: 2020-04-29 | Disposition: A | Discharge status: Home / Self Care | Payer: Medicare HMO | Attending: Family Medicine | Admitting: Family Medicine

## 2020-04-29 DIAGNOSIS — W11XXXA Fall on and from ladder, initial encounter: Secondary | ICD-10-CM | POA: Diagnosis not present

## 2020-04-29 DIAGNOSIS — M25512 Pain in left shoulder: Secondary | ICD-10-CM

## 2020-04-29 MED ORDER — IBUPROFEN 800 MG PO TABS: 800.0000 mg | ORAL_TABLET | Freq: Three times a day (TID) | ORAL | 0 refills | Status: DC | PRN

## 2020-04-29 MED ORDER — KETOROLAC TROMETHAMINE 30 MG/ML IJ SOLN
30.0000 mg | Freq: Once | INTRAMUSCULAR | Status: AC
  Administered 2020-04-29: 30 mg via INTRAMUSCULAR

## 2020-04-29 MED ORDER — KETOROLAC TROMETHAMINE 30 MG/ML IJ SOLN
INTRAMUSCULAR | Status: AC
  Filled 2020-04-29: qty 1

## 2020-04-29 MED ORDER — TRAMADOL HCL 50 MG PO TABS: 50.0000 mg | ORAL_TABLET | Freq: Four times a day (QID) | ORAL | 0 refills | Status: DC | PRN

## 2020-04-29 MED ORDER — CYCLOBENZAPRINE HCL 10 MG PO TABS
10.0000 mg | ORAL_TABLET | Freq: Two times a day (BID) | ORAL | 0 refills | Status: DC | PRN
Start: 2020-04-29 — End: 2020-08-08

## 2020-04-29 NOTE — Discharge Instructions (Addendum)
You have received an injection in the office for pain  I have sent ibuprofen, tramadol and flexeril to your pharmacy  Take the ibuprofen as prescribed.  Rest your shoulder. Apply ice packs 2-3 times a day for up to 20 minutes each.   Follow up with your primary care provider or an orthopedist if you symptoms continue or worsen;  Or if you develop new symptoms, such as numbness, tingling, or weakness.

## 2020-04-29 NOTE — ED Provider Notes (Signed)
[place]   [id number] 04/29/20 Arrival Time: 1426  QB:HALPF PAIN  SUBJECTIVE: History from: patient. [name] is a 64 y.o. male complains of left shoulder pain that began one month ago. Reports that he fell off of a ladder about 4 feet from the ground while pruning trees. Localizes the pain to the anterior aspect of the L shoulder. Describes the pain as constant and throbbing in character. Reports that the pain kept him awake last night. Has tried tylenol without relief. Symptoms are made worse with activity. Denies similar symptoms in the past. Denies fever, chills, erythema, ecchymosis, effusion, weakness, numbness and tingling, saddle paresthesias, loss of bowel or bladder function.      ROS: As per HPI.  All other pertinent ROS negative.     Past Medical History:  Diagnosis Date   Asthma    COPD (chronic obstructive pulmonary disease) (HCC)    Hypertension    Shortness of breath    Past Surgical History:  Procedure Laterality Date   COLONOSCOPY     Allergies  Allergen Reactions   Lisinopril Swelling   No current facility-administered medications on file prior to encounter.   Current Outpatient Medications on File Prior to Encounter  Medication Sig Dispense Refill   acetaminophen (TYLENOL) 500 MG tablet Take 1,000 mg by mouth as needed for mild pain.     albuterol (VENTOLIN HFA) 108 (90 Base) MCG/ACT inhaler Inhale 2 puffs into the lungs every 6 (six) hours as needed for wheezing or shortness of breath. 8 g 2   amLODipine (NORVASC) 10 MG tablet Take 1 tablet (10 mg total) by mouth daily. 90 tablet 3   Fluticasone-Umeclidin-Vilant (TRELEGY ELLIPTA) 100-62.5-25 MCG/INH AEPB Inhale 1 application into the lungs daily. 3 each 3   ipratropium-albuterol (DUONEB) 0.5-2.5 (3) MG/3ML SOLN INHALE THE CONTENTS OF 1 VIAL VIA NEBULIZER EVERY 6 HOURS AS NEEDED FOR SHORTNESS OF BREATH / WHEEZING 90 mL 2   loratadine (ALLERGY RELIEF) 10 MG tablet Take 10 mg by  mouth daily as needed for allergies.     Social History   Socioeconomic History   Marital status: Married    Spouse name: Not on file   Number of children: Not on file   Years of education: Not on file   Highest education level: Not on file  Occupational History   Not on file  Tobacco Use   Smoking status: Current Some Day Smoker    Packs/day: 0.50    Years: 30.00    Pack years: 15.00    Types: Cigarettes    Last attempt to quit: 08/21/2013    Years since quitting: 6.6   Smokeless tobacco: Never Used  Substance and Sexual Activity   Alcohol use: Yes    Comment: rare/occasional   Drug use: No   Sexual activity: Yes    Partners: Female  Other Topics Concern   Not on file  Social History Narrative   Not on file   Social Determinants of Health   Financial Resource Strain:    Difficulty of Paying Living Expenses:   Food Insecurity:    Worried About Charity fundraiser in the Last Year:    Arboriculturist in the Last Year:   Transportation Needs:    Film/video editor (Medical):    Lack of Transportation (Non-Medical):   Physical Activity:    Days of Exercise per Week:    Minutes of Exercise per Session:   Stress:    Feeling of  Stress :   Social Connections:    Frequency of Communication with Friends and Family:    Frequency of Social Gatherings with Friends and Family:    Attends Religious Services:    Active Member of Clubs or Organizations:    Attends Music therapist:    Marital Status:   Intimate Partner Violence:    Fear of Current or Ex-Partner:    Emotionally Abused:    Physically Abused:    Sexually Abused:    Family History  Problem Relation Age of Onset   Cancer Father     OBJECTIVE:  Vitals:   04/29/20 1452  BP: 135/62  Pulse: 88  Resp: 18  Temp: 98.8 F (37.1 C)  TempSrc: Tympanic  SpO2: 100%    General appearance: ALERT; in no acute distress.  Head: NCAT Lungs: Normal respiratory  effort CV: radial pulses 2+ bilaterally. Cap refill < 2 seconds Musculoskeletal:  Inspection: Skin warm, dry, clear and intact without obvious erythema, effusion, or ecchymosis.  Palpation: Anterior aspect of L shoulder very tender to palpation ROM: limited ROM active and passive to L shoulder Skin: warm and dry Neurologic: Ambulates without difficulty; Sensation intact about the upper/ lower extremities Psychological: alert and cooperative; normal mood and affect  DIAGNOSTIC STUDIES:  ASSESSMENT & PLAN:  1. Acute pain of left shoulder   2. Fall from ladder, initial encounter     Meds ordered this encounter  Medications   ketorolac (TORADOL) 30 MG/ML injection 30 mg   ibuprofen (ADVIL) 800 MG tablet    Sig: Take 1 tablet (800 mg total) by mouth every 8 (eight) hours as needed for moderate pain.    Dispense:  21 tablet    Refill:  0    Order Specific Question:   Supervising Provider    Answer:   Chase Picket [[id number]]   cyclobenzaprine (FLEXERIL) 10 MG tablet    Sig: Take 1 tablet (10 mg total) by mouth 2 (two) times daily as needed for muscle spasms.    Dispense:  20 tablet    Refill:  0    Order Specific Question:   Supervising Provider    Answer:   Chase Picket [[id number]]   traMADol (ULTRAM) 50 MG tablet    Sig: Take 1 tablet (50 mg total) by mouth every 6 (six) hours as needed.    Dispense:  15 tablet    Refill:  0    Order Specific Question:   Supervising Provider    Answer:   Chase Picket [[id number]]   Left Shoulder Pain Fall From Ladder Injured rotator cuff vs sprain Toradol 30mg  IM in office today Prescribed Ibuprofen Prescribed flexeril Prescribed Tramadol Continue conservative management of rest, ice, and gentle stretches Take ibuprofen as needed for pain relief (may cause abdominal discomfort, ulcers, and GI bleeds avoid taking with other NSAIDs) Take cyclobenzaprine at nighttime for symptomatic relief. Avoid driving or operating heavy  machinery while using medication. Follow up with PCP if symptoms persist Return or go to the ER if you have any new or worsening symptoms (fever, chills, chest pain, abdominal pain, changes in bowel or bladder habits, pain radiating into lower legs)   Reviewed expectations re: course of current medical issues. Questions answered. Outlined signs and symptoms indicating need for more acute intervention. Patient verbalized understanding. After Visit Summary given.       Faustino Congress, NP 04/29/20 1530

## 2020-04-29 NOTE — ED Triage Notes (Signed)
Pt presents today with left shoulder pain. Pt states his joints began to hurt after receiving second covid vaccine on 5/11. Unrelieved by tyleonl/heat/ice.

## 2020-05-13 ENCOUNTER — Other Ambulatory Visit: Payer: Self-pay

## 2020-05-13 ENCOUNTER — Encounter: Payer: Self-pay | Admitting: Family Medicine

## 2020-05-13 ENCOUNTER — Ambulatory Visit (INDEPENDENT_AMBULATORY_CARE_PROVIDER_SITE_OTHER): Payer: Medicare HMO | Admitting: Family Medicine

## 2020-05-13 ENCOUNTER — Other Ambulatory Visit: Payer: Self-pay | Admitting: Family Medicine

## 2020-05-13 DIAGNOSIS — J449 Chronic obstructive pulmonary disease, unspecified: Secondary | ICD-10-CM | POA: Diagnosis not present

## 2020-05-13 MED ORDER — ALBUTEROL SULFATE HFA 108 (90 BASE) MCG/ACT IN AERS: 2.0000 | INHALATION_SPRAY | Freq: Four times a day (QID) | RESPIRATORY_TRACT | 12 refills | Status: DC | PRN

## 2020-05-13 MED ORDER — IPRATROPIUM-ALBUTEROL 0.5-2.5 (3) MG/3ML IN SOLN: RESPIRATORY_TRACT | 2 refills | Status: DC

## 2020-05-13 MED ORDER — PREDNISONE 10 MG PO TABS
40.0000 mg | ORAL_TABLET | Freq: Every day | ORAL | 0 refills | Status: AC
Start: 2020-05-13 — End: 2020-05-18

## 2020-05-13 NOTE — Progress Notes (Signed)
    SUBJECTIVE:   CHIEF COMPLAINT / HPI:   Copd: Patient felt "normal" yesterday.  He always has some respiratory difficulty when first waking up but this improves after he takes his inhaler treatment.  This morning he did not get better after using his Trelegy inhaler.  He is with still "panting" for air.  Used the Trelegy twice today as well as his Ventolin inhaler (which he thinks was empty) as well as his nebulizer treatment and an old ipratropium vial that he put in his albuterol inhaler.  Patient states that he also needs more than 1 albuterol inhaler per month.  He has not been around anybody that has been sick lately and does not think this is related to allergies.  He has a mild cough but it is not worse than usual.   PERTINENT  PMH / PSH: COPD, seasonal allergies  OBJECTIVE:   BP 128/84   Pulse 90   Wt 124 lb 12.8 oz (56.6 kg)   SpO2 98%   BMI 18.43 kg/m   General: Alert, oriented.  Thin male appears stated age. CV: Regular rate and rhythm.  No murmurs. Pulmonary: Mild respiratory distress/increased work of breathing.  Mild tachypnea.  No crackles.  Very slight wheeze heard in right lower lobe otherwise without wheezing. Extremities: No lower extremity edema  ASSESSMENT/PLAN:   COPD (chronic obstructive pulmonary disease) ([place]) Patient is to be having a mild exacerbation.  He has had almost monthly trips to the ED over the past year for COPD exacerbations.  Does not appear to be triggered by infectious or allergic process.  Appears to be compliant with his medications.  Uncertain trigger, but patient does appear in some degree of respiratory distress. -5 days of prednisone -Every 4 hours scheduled duo nebs today, spaced out to every 6 hours tomorrow, followed by every 6 hours albuterol inhaler after that until he finishes his steroid therapy.     Benay Pike, MD [place]

## 2020-05-13 NOTE — Progress Notes (Signed)
Ambulated patient with pulse oximeter per Dr. Jeannine Kitten. Patient maintained good O2 Saturation and HR. Toward end of walk patient's O2 saturation dropped to 93% RA with some shortness of breath.   Dr. Jeannine Kitten made aware.   Talbot Grumbling, RN

## 2020-05-13 NOTE — Patient Instructions (Signed)
It was nice to meet you today,  I wanted to do the following things for your COPD exacerbation:  -Take 40 mg (4 tablets) of prednisone each day with breakfast for the next 5 days. -Today use your nebulizer machine with duo nebs every 4 hours scheduled while you are awake.  Tomorrow you can switch to every 6 hours. -On the third day you can switch from DuoNeb nebulizer to albuterol inhaler and use that every 6 hours until you finish your course of steroids. -Continue to take your Trelegy inhaler in the morning as scheduled. -I have left a message with prescription for albuterol to tell the pharmacy that you can have 2 inhalers of albuterol per month.  I have included 12 refills as well.  If your breathing gets worse during this time, please go to the ED to get evaluated or for further treatment.  Have a great day,  Clemetine Marker, MD

## 2020-05-14 ENCOUNTER — Other Ambulatory Visit: Payer: Self-pay | Admitting: *Deleted

## 2020-05-14 DIAGNOSIS — J449 Chronic obstructive pulmonary disease, unspecified: Secondary | ICD-10-CM

## 2020-05-14 NOTE — Assessment & Plan Note (Signed)
Patient is to be having a mild exacerbation.  He has had almost monthly trips to the ED over the past year for COPD exacerbations.  Does not appear to be triggered by infectious or allergic process.  Appears to be compliant with his medications.  Uncertain trigger, but patient does appear in some degree of respiratory distress. -5 days of prednisone -Every 4 hours scheduled duo nebs today, spaced out to every 6 hours tomorrow, followed by every 6 hours albuterol inhaler after that until he finishes his steroid therapy.

## 2020-05-18 ENCOUNTER — Other Ambulatory Visit: Payer: Self-pay | Admitting: *Deleted

## 2020-05-18 DIAGNOSIS — J449 Chronic obstructive pulmonary disease, unspecified: Secondary | ICD-10-CM

## 2020-05-18 MED ORDER — ALBUTEROL SULFATE HFA 108 (90 BASE) MCG/ACT IN AERS: INHALATION_SPRAY | RESPIRATORY_TRACT | 11 refills | Status: DC

## 2020-05-26 ENCOUNTER — Other Ambulatory Visit: Payer: Self-pay | Admitting: *Deleted

## 2020-05-26 DIAGNOSIS — J449 Chronic obstructive pulmonary disease, unspecified: Secondary | ICD-10-CM

## 2020-06-04 ENCOUNTER — Emergency Department (HOSPITAL_COMMUNITY)
Admission: EM | Admit: 2020-06-04 | Discharge: 2020-06-05 | Disposition: A | Discharge status: Home / Self Care | Payer: Medicare Other | Attending: Emergency Medicine | Admitting: Emergency Medicine

## 2020-06-04 ENCOUNTER — Encounter (HOSPITAL_COMMUNITY): Payer: Self-pay

## 2020-06-04 ENCOUNTER — Emergency Department (HOSPITAL_COMMUNITY): Payer: Medicare Other

## 2020-06-04 ENCOUNTER — Other Ambulatory Visit: Payer: Self-pay

## 2020-06-04 DIAGNOSIS — R0602 Shortness of breath: Secondary | ICD-10-CM | POA: Diagnosis present

## 2020-06-04 DIAGNOSIS — Z79899 Other long term (current) drug therapy: Secondary | ICD-10-CM | POA: Diagnosis not present

## 2020-06-04 DIAGNOSIS — F17210 Nicotine dependence, cigarettes, uncomplicated: Secondary | ICD-10-CM | POA: Insufficient documentation

## 2020-06-04 DIAGNOSIS — I10 Essential (primary) hypertension: Secondary | ICD-10-CM | POA: Insufficient documentation

## 2020-06-04 DIAGNOSIS — J441 Chronic obstructive pulmonary disease with (acute) exacerbation: Secondary | ICD-10-CM | POA: Insufficient documentation

## 2020-06-04 LAB — CBC
HCT: 42.7 % (ref 39.0–52.0)
Hemoglobin: 14.1 g/dL (ref 13.0–17.0)
MCH: 33.5 pg (ref 26.0–34.0)
MCHC: 33 g/dL (ref 30.0–36.0)
MCV: 101.4 fL — ABNORMAL HIGH (ref 80.0–100.0)
Platelets: 242 10*3/uL (ref 150–400)
RBC: 4.21 MIL/uL — ABNORMAL LOW (ref 4.22–5.81)
RDW: 12.6 % (ref 11.5–15.5)
WBC: 7.4 10*3/uL (ref 4.0–10.5)
nRBC: 0 % (ref 0.0–0.2)

## 2020-06-04 LAB — BASIC METABOLIC PANEL
Anion gap: 9 (ref 5–15)
BUN: 26 mg/dL — ABNORMAL HIGH (ref 8–23)
CO2: 22 mmol/L (ref 22–32)
Calcium: 9.4 mg/dL (ref 8.9–10.3)
Chloride: 110 mmol/L (ref 98–111)
Creatinine, Ser: 1.44 mg/dL — ABNORMAL HIGH (ref 0.61–1.24)
GFR calc Af Amer: 59 mL/min — ABNORMAL LOW (ref >60)
GFR calc non Af Amer: 51 mL/min — ABNORMAL LOW (ref >60)
Glucose, Bld: 75 mg/dL (ref 70–99)
Potassium: 4.1 mmol/L (ref 3.5–5.1)
Sodium: 141 mmol/L (ref 135–145)

## 2020-06-04 MED ORDER — ALBUTEROL SULFATE (2.5 MG/3ML) 0.083% IN NEBU: 5.0000 mg | INHALATION_SOLUTION | Freq: Once | RESPIRATORY_TRACT | Status: DC

## 2020-06-04 NOTE — ED Triage Notes (Signed)
Pt presents via POV c/o COPD exacerbation. Pt reports taking inhaler treatments without relief. Pt denies fever.

## 2020-06-05 MED ORDER — PREDNISONE 10 MG PO TABS
ORAL_TABLET | ORAL | 0 refills | Status: DC
Start: 2020-06-05 — End: 2020-08-07

## 2020-06-05 MED ORDER — ALBUTEROL SULFATE (2.5 MG/3ML) 0.083% IN NEBU
5.0000 mg | INHALATION_SOLUTION | Freq: Once | RESPIRATORY_TRACT | Status: AC
  Administered 2020-06-05: 5 mg via RESPIRATORY_TRACT
  Filled 2020-06-05: qty 6

## 2020-06-05 MED ORDER — IPRATROPIUM BROMIDE 0.02 % IN SOLN
0.5000 mg | Freq: Once | RESPIRATORY_TRACT | Status: AC
  Administered 2020-06-05: 0.5 mg via RESPIRATORY_TRACT
  Filled 2020-06-05: qty 2.5

## 2020-06-05 MED ORDER — PREDNISONE 20 MG PO TABS
60.0000 mg | ORAL_TABLET | Freq: Once | ORAL | Status: AC
  Administered 2020-06-05: 60 mg via ORAL
  Filled 2020-06-05: qty 3

## 2020-06-05 NOTE — ED Notes (Signed)
Pt ambulated in hall. Tolerated well. SpO2 remained above 94% during ambulation.

## 2020-06-05 NOTE — ED Provider Notes (Signed)
COPD exacerbation.  Will need to check O2 with ambulation.  Anticipate d/c with prednisone taper course and outpt f/u.  May need 2 treatments.    8:48 AM Patient report after initial dose of albuterol as well as steroid, he is feeling much better, moving air much better than before.  When ambulating, O2 sats at 94%.  He feels comfortable going home.  Will discharge home with prednisone taper.  Outpatient follow-up recommended.  Return precaution discussed.  BP (!) 180/98 (BP Location: Right Arm)   Pulse 82   Temp 98.1 F (36.7 C) (Oral)   Resp 14   SpO2 100%   The patient was noted to be hypertensive today in the emergency department. I have spoken with the patient regarding hypertension and the need for improved management. I instructed the patient to followup with the Primary care doctor within 4 days to improve the management of the patient's hypertension. I also counseled the patient regarding the signs and symptoms which would require an emergent visit to an emergency department for hypertensive urgency and/or hypertensive emergency. The patient understood the need for improved hypertensive management.   Results for orders placed or performed during the hospital encounter of 06/04/20  CBC  Result Value Ref Range   WBC 7.4 4.0 - 10.5 K/uL   RBC 4.21 (L) 4.22 - 5.81 MIL/uL   Hemoglobin 14.1 13.0 - 17.0 g/dL   HCT 42.7 39 - 52 %   MCV 101.4 (H) 80.0 - 100.0 fL   MCH 33.5 26.0 - 34.0 pg   MCHC 33.0 30.0 - 36.0 g/dL   RDW 12.6 11.5 - 15.5 %   Platelets 242 150 - 400 K/uL   nRBC 0.0 0.0 - 0.2 %  Basic metabolic panel  Result Value Ref Range   Sodium 141 135 - 145 mmol/L   Potassium 4.1 3.5 - 5.1 mmol/L   Chloride 110 98 - 111 mmol/L   CO2 22 22 - 32 mmol/L   Glucose, Bld 75 70 - 99 mg/dL   BUN 26 (H) 8 - 23 mg/dL   Creatinine, Ser 1.44 (H) 0.61 - 1.24 mg/dL   Calcium 9.4 8.9 - 10.3 mg/dL   GFR calc non Af Amer 51 (L) >60 mL/min   GFR calc Af Amer 59 (L) >60 mL/min   Anion gap 9  5 - 15   DG Chest 2 View  Result Date: 06/04/2020 CLINICAL DATA:  Dyspnea EXAM: CHEST - 2 VIEW COMPARISON:  None. FINDINGS: The lungs are mildly hyperinflated in keeping with changes of underlying COPD. No superimposed confluent pulmonary infiltrate or focal pulmonary nodule. No pneumothorax or pleural effusion. Cardiac size within normal limits. The pulmonary vascularity is normal. No acute bone abnormality. IMPRESSION: Mild pulmonary hyperinflation, stable since prior examination. Electronically Signed   By: Fidela Salisbury MD   On: 06/04/2020 23:12      Domenic Moras, PA-C 06/05/20 2725    Charlesetta Shanks, MD 06/05/20 1123

## 2020-06-05 NOTE — Discharge Instructions (Addendum)
Follow up with your doctor for recheck in one week.   Return to the ED with any new or worsening symptoms.

## 2020-06-05 NOTE — ED Notes (Addendum)
States his COPD is acting up, states he is using his inhaler and Neb machine at home however it isn't working. States he has a history of HTN and states he takes his medication everyday.

## 2020-06-05 NOTE — ED Provider Notes (Signed)
[place] EMERGENCY DEPARTMENT Provider Note   CSN: [id number] Arrival date & time: 06/04/20  2240     History Chief Complaint  Patient presents with  . Shortness of Breath    [name] is a 64 y.o. male.  Patient to ED wheezing and SOB c/w COPD uncontrolled with home medications. No fever, no chest pain, no vomiting. He feels SOB with light exertion "which is when I known I'm in trouble".   The history is provided by the patient. No language interpreter was used.  Shortness of Breath Associated symptoms: cough   Associated symptoms: no abdominal pain, no chest pain and no fever        Past Medical History:  Diagnosis Date  . Asthma   . COPD (chronic obstructive pulmonary disease) ([place])   . Hypertension   . Shortness of breath     Patient Active Problem List   Diagnosis Date Noted  . Lung nodule < 6cm on CT 03/19/2019  . Uncontrolled hypertension 03/11/2019  . Primary hypertension   . Tobacco use disorder 08/04/2009  . Erectile dysfunction 12/31/2008  . COPD (chronic obstructive pulmonary disease) ([place]) 12/31/2008  . History of colonic polyps 01/18/2007    Past Surgical History:  Procedure Laterality Date  . COLONOSCOPY         Family History  Problem Relation Age of Onset  . Cancer Father     Social History   Tobacco Use  . Smoking status: Current Some Day Smoker    Packs/day: 0.50    Years: 30.00    Pack years: 15.00    Types: Cigarettes    Last attempt to quit: 08/21/2013    Years since quitting: 6.7  . Smokeless tobacco: Never Used  Substance Use Topics  . Alcohol use: Yes    Comment: rare/occasional  . Drug use: No    Home Medications Prior to Admission medications   Medication Sig Start Date End Date Taking? Authorizing Provider  acetaminophen (TYLENOL) 500 MG tablet Take 1,000 mg by mouth as needed for mild pain.   Yes [provider]  albuterol (VENTOLIN HFA) 108 (90 Base) MCG/ACT inhaler INHALE 2  PUFFS EVERY 6 HOURS AS NEEDED FOR WHEEZING OR SHORTNESS OF BREATH Patient taking differently: Inhale 2 puffs into the lungs every 6 (six) hours as needed for wheezing or shortness of breath.  05/18/20  Yes Mullis, Kiersten P, DO  amLODipine (NORVASC) 10 MG tablet Take 1 tablet (10 mg total) by mouth daily. 11/28/19  Yes Mullis, Kiersten P, DO  cyclobenzaprine (FLEXERIL) 10 MG tablet Take 1 tablet (10 mg total) by mouth 2 (two) times daily as needed for muscle spasms. 04/29/20  Yes Faustino Congress, NP  Fluticasone-Umeclidin-Vilant (TRELEGY ELLIPTA) 100-62.5-25 MCG/INH AEPB Inhale 1 application into the lungs daily. 01/27/20  Yes Mullis, Kiersten P, DO  ibuprofen (ADVIL) 800 MG tablet Take 1 tablet (800 mg total) by mouth every 8 (eight) hours as needed for moderate pain. 04/29/20  Yes Faustino Congress, NP  ipratropium-albuterol (DUONEB) 0.5-2.5 (3) MG/3ML SOLN INHALE THE CONTENTS OF 1 VIAL VIA NEBULIZER EVERY 6 HOURS AS NEEDED FOR SHORTNESS OF BREATH / WHEEZING Patient taking differently: Take 3 mLs by nebulization every 6 (six) hours as needed (Shortness of breath and wheezing).  05/13/20  Yes Benay Pike, MD  loratadine (ALLERGY RELIEF) 10 MG tablet Take 10 mg by mouth daily as needed for allergies.   Yes [provider]  traMADol (ULTRAM) 50 MG tablet Take 1  tablet (50 mg total) by mouth every 6 (six) hours as needed. Patient taking differently: Take 50 mg by mouth every 6 (six) hours as needed for moderate pain.  04/29/20  Yes Faustino Congress, NP    Allergies    Lisinopril  Review of Systems   Review of Systems  Constitutional: Negative for chills and fever.  HENT: Negative.   Respiratory: Positive for cough and shortness of breath.   Cardiovascular: Negative.  Negative for chest pain.  Gastrointestinal: Negative.  Negative for abdominal pain.  Musculoskeletal: Negative.   Skin: Negative.   Neurological: Negative.  Negative for light-headedness.    Physical Exam Updated  Vital Signs BP (!) 180/98 (BP Location: Right Arm)   Pulse 82   Temp 98.1 F (36.7 C) (Oral)   Resp 14   SpO2 100%   Physical Exam Constitutional:      Appearance: He is well-developed. He is not ill-appearing.  HENT:     Head: Normocephalic.  Cardiovascular:     Rate and Rhythm: Normal rate and regular rhythm.  Pulmonary:     Effort: Pulmonary effort is normal.     Breath sounds: Wheezing and rhonchi present.  Abdominal:     General: Bowel sounds are normal.     Palpations: Abdomen is soft.     Tenderness: There is no abdominal tenderness. There is no guarding or rebound.  Musculoskeletal:        General: Normal range of motion.     Cervical back: Normal range of motion and neck supple.     Right lower leg: No edema.     Left lower leg: No edema.  Skin:    General: Skin is warm and dry.     Findings: No rash.  Neurological:     General: No focal deficit present.     Mental Status: He is alert and oriented to person, place, and time.     ED Results / Procedures / Treatments   Labs (all labs ordered are listed, but only abnormal results are displayed) Labs Reviewed  CBC - Abnormal; Notable for the following components:      Result Value   RBC 4.21 (*)    MCV 101.4 (*)    All other components within normal limits  BASIC METABOLIC PANEL - Abnormal; Notable for the following components:   BUN 26 (*)    Creatinine, Ser 1.44 (*)    GFR calc non Af Amer 51 (*)    GFR calc Af Amer 59 (*)    All other components within normal limits    EKG EKG Interpretation  Date/Time:  Thursday June 04 2020 22:49:26 EDT Ventricular Rate:  94 PR Interval:  132 QRS Duration: 98 QT Interval:  376 QTC Calculation: 470 R Axis:   83 Text Interpretation: Normal sinus rhythm Abnormal QRS-T angle, consider primary T wave abnormality Abnormal ECG Confirmed by Merrily Pew [phone]) on 06/05/2020 2:21:49 AM   Radiology DG Chest 2 View  Result Date: 06/04/2020 CLINICAL DATA:  Dyspnea  EXAM: CHEST - 2 VIEW COMPARISON:  None. FINDINGS: The lungs are mildly hyperinflated in keeping with changes of underlying COPD. No superimposed confluent pulmonary infiltrate or focal pulmonary nodule. No pneumothorax or pleural effusion. Cardiac size within normal limits. The pulmonary vascularity is normal. No acute bone abnormality. IMPRESSION: Mild pulmonary hyperinflation, stable since prior examination. Electronically Signed   By: Fidela Salisbury MD   On: 06/04/2020 23:12    Procedures Procedures (including critical care time)  Medications  Ordered in ED Medications  albuterol (PROVENTIL) (2.5 MG/3ML) 0.083% nebulizer solution 5 mg (has no administration in time range)  albuterol (PROVENTIL) (2.5 MG/3ML) 0.083% nebulizer solution 5 mg (5 mg Nebulization Given 06/05/20 0652)  ipratropium (ATROVENT) nebulizer solution 0.5 mg (0.5 mg Nebulization Given 06/05/20 0652)  predniSONE (DELTASONE) tablet 60 mg (60 mg Oral Given 06/05/20 9191)    ED Course  I have reviewed the triage vital signs and the nursing notes.  Pertinent labs & imaging results that were available during my care of the patient were reviewed by me and considered in my medical decision making (see chart for details).    MDM Rules/Calculators/A&P                          Patient to ED with sxs of COPD exacerbation. No pain, no fever.   Albuterol/Atroven, prednisone ordered and pending at the time of end of shift sign out of patient care to Domenic Moras, PA-C for reassessment and disposition. Anticipate discharge home.   Final Clinical Impression(s) / ED Diagnoses Final diagnoses:  None   1. COPD exacerbation  Rx / DC Orders ED Discharge Orders    None       Charlann Lange, PA-C 06/07/20 0551    Fatima Blank, MD 06/09/20 [phone]

## 2020-06-05 NOTE — ED Notes (Signed)
Labs being drawn , patient had refused labs until he was in a room.

## 2020-06-15 ENCOUNTER — Other Ambulatory Visit: Payer: Self-pay

## 2020-06-15 DIAGNOSIS — J449 Chronic obstructive pulmonary disease, unspecified: Secondary | ICD-10-CM

## 2020-06-15 MED ORDER — TRELEGY ELLIPTA 100-62.5-25 MCG/INH IN AEPB: 1.0000 "application " | INHALATION_SPRAY | Freq: Every day | RESPIRATORY_TRACT | 3 refills | Status: DC

## 2020-06-15 NOTE — Telephone Encounter (Signed)
Patient calls nurse line requesting emergent refill on Trelegy. Patient states that he is completely out of inhaler. States that this medication helps to keep him out of the hospital.   Will forward to PCP and preceptor.   Talbot Grumbling, RN

## 2020-07-10 ENCOUNTER — Other Ambulatory Visit: Payer: Self-pay

## 2020-07-10 DIAGNOSIS — I10 Essential (primary) hypertension: Secondary | ICD-10-CM

## 2020-07-10 DIAGNOSIS — J449 Chronic obstructive pulmonary disease, unspecified: Secondary | ICD-10-CM

## 2020-07-10 MED ORDER — AMLODIPINE BESYLATE 10 MG PO TABS: 10.0000 mg | ORAL_TABLET | Freq: Every day | ORAL | 3 refills | Status: DC

## 2020-07-10 MED ORDER — ALBUTEROL SULFATE HFA 108 (90 BASE) MCG/ACT IN AERS: INHALATION_SPRAY | RESPIRATORY_TRACT | 11 refills | Status: DC

## 2020-07-10 MED ORDER — IPRATROPIUM-ALBUTEROL 0.5-2.5 (3) MG/3ML IN SOLN: RESPIRATORY_TRACT | 2 refills | Status: DC

## 2020-08-04 ENCOUNTER — Other Ambulatory Visit: Payer: Self-pay

## 2020-08-04 DIAGNOSIS — J449 Chronic obstructive pulmonary disease, unspecified: Secondary | ICD-10-CM

## 2020-08-06 MED ORDER — TRELEGY ELLIPTA 100-62.5-25 MCG/INH IN AEPB: 1.0000 "application " | INHALATION_SPRAY | Freq: Every day | RESPIRATORY_TRACT | 3 refills | Status: DC

## 2020-08-07 ENCOUNTER — Ambulatory Visit (INDEPENDENT_AMBULATORY_CARE_PROVIDER_SITE_OTHER): Payer: Medicare HMO | Admitting: Family Medicine

## 2020-08-07 ENCOUNTER — Other Ambulatory Visit: Payer: Self-pay

## 2020-08-07 VITALS — BP 160/80 | HR 82 | Wt 127.2 lb

## 2020-08-07 DIAGNOSIS — I10 Essential (primary) hypertension: Secondary | ICD-10-CM

## 2020-08-07 DIAGNOSIS — IMO0001 Reserved for inherently not codable concepts without codable children: Secondary | ICD-10-CM

## 2020-08-07 DIAGNOSIS — R768 Other specified abnormal immunological findings in serum: Secondary | ICD-10-CM

## 2020-08-07 DIAGNOSIS — Z0000 Encounter for general adult medical examination without abnormal findings: Secondary | ICD-10-CM | POA: Diagnosis not present

## 2020-08-07 DIAGNOSIS — J449 Chronic obstructive pulmonary disease, unspecified: Secondary | ICD-10-CM | POA: Diagnosis not present

## 2020-08-07 DIAGNOSIS — Z23 Encounter for immunization: Secondary | ICD-10-CM | POA: Diagnosis not present

## 2020-08-07 DIAGNOSIS — R911 Solitary pulmonary nodule: Secondary | ICD-10-CM

## 2020-08-07 DIAGNOSIS — F17200 Nicotine dependence, unspecified, uncomplicated: Secondary | ICD-10-CM

## 2020-08-07 DIAGNOSIS — J441 Chronic obstructive pulmonary disease with (acute) exacerbation: Secondary | ICD-10-CM

## 2020-08-07 LAB — POCT GLYCOSYLATED HEMOGLOBIN (HGB A1C): Hemoglobin A1C: 4.5 % (ref 4.0–5.6)

## 2020-08-07 MED ORDER — ALBUTEROL SULFATE HFA 108 (90 BASE) MCG/ACT IN AERS: INHALATION_SPRAY | RESPIRATORY_TRACT | 1 refills | Status: DC

## 2020-08-07 MED ORDER — PREDNISONE 20 MG PO TABS: 40.0000 mg | ORAL_TABLET | Freq: Every day | ORAL | 0 refills | Status: AC

## 2020-08-07 MED ORDER — ZOSTER VAC RECOMB ADJUVANTED 50 MCG/0.5ML IM SUSR: 0.5000 mL | Freq: Once | INTRAMUSCULAR | 0 refills | Status: AC

## 2020-08-07 NOTE — Progress Notes (Signed)
    SUBJECTIVE:   CHIEF COMPLAINT / HPI: Medication refills/check up   Mr. Dejarnette is a 64 year old gentleman presenting for a checkup and medication refills.  Dyspnea: Recently was out of his Trelegy for about 5-6 days in between insurance/provider approval and restarted it 2 days ago.  He initially had cough with increased mucus production, however this is dissipated after restarting the Trelegy.  However, he still feels more winded than usual.  This is occurred on several occasions in the past whenever he stops his Trelegy.  He has frequent COPD exacerbations, gold stage D.  No known Covid exposures, fully vaccinated.  Denies any chest pain, fever, fatigue, myalgias, change in taste/smell.  Hypertension: Taking Norvasc 10 mg, did not take his medicine this morning.  Has not checked his blood pressure at home, however his wife does have a cough.  Has been elevated on several occasions within epic, however usually being seen in the ED for complaint.  Asymptomatic, denies any recent chest pain, headache, palpitations, lightheadedness/dizziness, visual changes.  Health maintenance: Due for flu shot, Tdap, hep C screening  Tobacco use: Started around age [age].  Most about 1 PPD, currently reports he has approximately 1 cigarette every few days with a "beer and friends".  Cut back when diagnosed with COPD in his late 69s.  PERTINENT  PMH / PSH: Hypertension, COPD, tobacco use, lung nodule present on CT in 02/2019  OBJECTIVE:   BP (!) 160/80   Pulse 82   Wt 127 lb 3.2 oz (57.7 kg)   SpO2 96%   BMI 18.78 kg/m   General: Alert, NAD HEENT: NCAT, MMM Cardiac: RRR  Lungs: Clear with inspiration, very faint occasional end expiratory wheeze in posterior lung fields, no increased WOB at rest or with walking in exam room, satting appropriately on room air Msk: Moves all extremities spontaneously  Ext: Warm, dry, 2+ distal pulses, no edema b/l   ASSESSMENT/PLAN:   Encounter for annual physical  exam Reviewed past medical, surgical, and social history.  Medications updated in epic.  Health maintenance reviewed, will collect hepatitis C screening and flu shot given today.  Sent prescription for Shingrix to [facility].  Encouraged working towards a well-balanced diet and staying physically active.  Discuss Tdap on follow-up.  Uncontrolled hypertension Elevated today, did not take home medication.  Stressed importance of long-term hypertensive control, recommended measuring BP at home and bring in journal on follow-up visit.  Continue Norvasc as is for now, likely will need second agent.  Check BMP (especially with AKI vs developing CKD recently), lipid panel, and screening A1c.  Lung nodule < 6cm on CT Incidental finding, 4 mm nodule on 02/2019 chest CT.  Given relatively high risk with severe COPD and long-term smoking history, will repeat CT chest.  Scheduled for 10/1.  Tobacco use disorder Encourage complete cessation.   COPD exacerbation (HCC) Mild in the setting of briefly discontinued controller inhaler use.  Reassuringly afebrile, breathing comfortable with appropriate oxygen saturations at rest.  Doubt Covid given presentation.  Rx'd short prednisone course, without increased sputum production/consistency does not need antibiotics at this time.  May use home albuterol as needed.    Follow-up in 2 weeks for conditions as above or sooner if needed if persistent dyspnea despite treatment.  Patriciaann Clan, [place]

## 2020-08-07 NOTE — Patient Instructions (Signed)
It was wonderful meeting you today.   Please start taking your blood pressure every 2-3 days and write it down a journal to bring to a follow-up visit.  Please continue to take your Norvasc for blood pressure control.  We are checking some labs today these results should be back in the next several days.  We will get you scheduled for the CT chest in the next several weeks to check up on that nodule.  Please follow-up in 2 weeks for blood pressure control or sooner if you continue to have any difficulty breathing.

## 2020-08-08 DIAGNOSIS — Z0000 Encounter for general adult medical examination without abnormal findings: Secondary | ICD-10-CM | POA: Insufficient documentation

## 2020-08-08 LAB — BASIC METABOLIC PANEL
BUN/Creatinine Ratio: 16 (ref 10–24)
BUN: 21 mg/dL (ref 8–27)
CO2: 20 mmol/L (ref 20–29)
Calcium: 9.8 mg/dL (ref 8.6–10.2)
Chloride: 109 mmol/L — ABNORMAL HIGH (ref 96–106)
Creatinine, Ser: 1.28 mg/dL — ABNORMAL HIGH (ref 0.76–1.27)
GFR calc Af Amer: 67 mL/min/{1.73_m2} (ref >59)
GFR calc non Af Amer: 58 mL/min/{1.73_m2} — ABNORMAL LOW (ref >59)
Glucose: 58 mg/dL — ABNORMAL LOW (ref 65–99)
Potassium: 4.6 mmol/L (ref 3.5–5.2)
Sodium: 143 mmol/L (ref 134–144)

## 2020-08-08 LAB — LIPID PANEL
Chol/HDL Ratio: 2.6 ratio (ref 0.0–5.0)
Cholesterol, Total: 159 mg/dL (ref 100–199)
HDL: 61 mg/dL (ref >39)
LDL Chol Calc (NIH): 81 mg/dL (ref 0–99)
Triglycerides: 95 mg/dL (ref 0–149)
VLDL Cholesterol Cal: 17 mg/dL (ref 5–40)

## 2020-08-08 LAB — HEPATITIS C ANTIBODY: Hep C Virus Ab: 1.6 s/co ratio — ABNORMAL HIGH (ref 0.0–0.9)

## 2020-08-08 NOTE — Assessment & Plan Note (Addendum)
Reviewed past medical, surgical, and social history.  Medications updated in epic.  Health maintenance reviewed, will collect hepatitis C screening and flu shot given today.  Sent prescription for [facility] to [facility].  Encouraged working towards a well-balanced diet and staying physically active.  Discuss Tdap on follow-up.

## 2020-08-08 NOTE — Assessment & Plan Note (Addendum)
Elevated today, did not take home medication.  Stressed importance of long-term hypertensive control, recommended measuring BP at home and bring in journal on follow-up visit.  Continue Norvasc as is for now, likely will need second agent.  Check BMP (especially with AKI vs developing CKD recently), lipid panel, and screening A1c.

## 2020-08-09 ENCOUNTER — Encounter: Payer: Self-pay | Admitting: Family Medicine

## 2020-08-09 NOTE — Assessment & Plan Note (Signed)
Encourage complete cessation.

## 2020-08-09 NOTE — Assessment & Plan Note (Signed)
Mild in the setting of briefly discontinued controller inhaler use.  Reassuringly afebrile, breathing comfortable with appropriate oxygen saturations at rest.  Doubt Covid given presentation.  Rx'd short prednisone course, without increased sputum production/consistency does not need antibiotics at this time.  May use home albuterol as needed.

## 2020-08-09 NOTE — Assessment & Plan Note (Signed)
Incidental finding, 4 mm nodule on 02/2019 chest CT.  Given relatively high risk with severe COPD and long-term smoking history, will repeat CT chest.  Scheduled for 10/1.

## 2020-08-17 ENCOUNTER — Encounter: Payer: Self-pay | Admitting: Family Medicine

## 2020-08-17 ENCOUNTER — Telehealth: Payer: Self-pay

## 2020-08-17 NOTE — Telephone Encounter (Signed)
Patient calls nurse line stating his missed a call about results. I informed patient of results and scheduled him for 10/1 for HCV RNA Quant. The patient had no further questions.

## 2020-08-17 NOTE — Telephone Encounter (Signed)
Thank you so much!   Patriciaann Clan, DO

## 2020-08-21 ENCOUNTER — Other Ambulatory Visit: Payer: Self-pay

## 2020-08-21 ENCOUNTER — Other Ambulatory Visit: Payer: Medicare HMO

## 2020-08-21 ENCOUNTER — Ambulatory Visit (HOSPITAL_COMMUNITY)
Admission: RE | Admit: 2020-08-21 | Discharge: 2020-08-21 | Disposition: A | Discharge status: Home / Self Care | Payer: Medicare HMO | Source: Ambulatory Visit | Attending: Family Medicine | Admitting: Family Medicine

## 2020-08-21 DIAGNOSIS — R911 Solitary pulmonary nodule: Secondary | ICD-10-CM | POA: Insufficient documentation

## 2020-08-21 DIAGNOSIS — R768 Other specified abnormal immunological findings in serum: Secondary | ICD-10-CM

## 2020-08-21 DIAGNOSIS — IMO0001 Reserved for inherently not codable concepts without codable children: Secondary | ICD-10-CM

## 2020-08-22 LAB — HCV RNA QUANT: Hepatitis C Quantitation: NOT DETECTED IU/mL

## 2020-08-25 ENCOUNTER — Emergency Department (HOSPITAL_COMMUNITY): Payer: Medicare HMO

## 2020-08-25 ENCOUNTER — Other Ambulatory Visit: Payer: Self-pay

## 2020-08-25 ENCOUNTER — Telehealth: Payer: Self-pay

## 2020-08-25 ENCOUNTER — Inpatient Hospital Stay (HOSPITAL_COMMUNITY)
Admission: EM | Admit: 2020-08-25 | Discharge: 2020-08-28 | DRG: 190 | Disposition: A | Discharge status: Home / Self Care | Payer: Medicare HMO | Attending: Family Medicine | Admitting: Family Medicine

## 2020-08-25 DIAGNOSIS — Z20822 Contact with and (suspected) exposure to covid-19: Secondary | ICD-10-CM | POA: Diagnosis present

## 2020-08-25 DIAGNOSIS — Z8719 Personal history of other diseases of the digestive system: Secondary | ICD-10-CM

## 2020-08-25 DIAGNOSIS — Z79899 Other long term (current) drug therapy: Secondary | ICD-10-CM

## 2020-08-25 DIAGNOSIS — N1831 Chronic kidney disease, stage 3a: Secondary | ICD-10-CM | POA: Diagnosis present

## 2020-08-25 DIAGNOSIS — I129 Hypertensive chronic kidney disease with stage 1 through stage 4 chronic kidney disease, or unspecified chronic kidney disease: Secondary | ICD-10-CM | POA: Diagnosis present

## 2020-08-25 DIAGNOSIS — R911 Solitary pulmonary nodule: Secondary | ICD-10-CM | POA: Diagnosis present

## 2020-08-25 DIAGNOSIS — Z23 Encounter for immunization: Secondary | ICD-10-CM

## 2020-08-25 DIAGNOSIS — N529 Male erectile dysfunction, unspecified: Secondary | ICD-10-CM | POA: Diagnosis present

## 2020-08-25 DIAGNOSIS — J9621 Acute and chronic respiratory failure with hypoxia: Secondary | ICD-10-CM | POA: Diagnosis present

## 2020-08-25 DIAGNOSIS — Z888 Allergy status to other drugs, medicaments and biological substances status: Secondary | ICD-10-CM | POA: Diagnosis not present

## 2020-08-25 DIAGNOSIS — F17210 Nicotine dependence, cigarettes, uncomplicated: Secondary | ICD-10-CM | POA: Diagnosis present

## 2020-08-25 DIAGNOSIS — J441 Chronic obstructive pulmonary disease with (acute) exacerbation: Principal | ICD-10-CM | POA: Diagnosis present

## 2020-08-25 LAB — RESP PANEL BY RT PCR (RSV, FLU A&B, COVID)
Influenza A by PCR: NEGATIVE
Influenza B by PCR: NEGATIVE
Respiratory Syncytial Virus by PCR: NEGATIVE
SARS Coronavirus 2 by RT PCR: NEGATIVE

## 2020-08-25 LAB — BASIC METABOLIC PANEL
Anion gap: 12 (ref 5–15)
BUN: 19 mg/dL (ref 8–23)
CO2: 22 mmol/L (ref 22–32)
Calcium: 9.6 mg/dL (ref 8.9–10.3)
Chloride: 106 mmol/L (ref 98–111)
Creatinine, Ser: 1.5 mg/dL — ABNORMAL HIGH (ref 0.61–1.24)
GFR calc non Af Amer: 48 mL/min — ABNORMAL LOW (ref >60)
Glucose, Bld: 110 mg/dL — ABNORMAL HIGH (ref 70–99)
Potassium: 3.7 mmol/L (ref 3.5–5.1)
Sodium: 140 mmol/L (ref 135–145)

## 2020-08-25 LAB — CBC WITH DIFFERENTIAL/PLATELET
Abs Immature Granulocytes: 0.02 10*3/uL (ref 0.00–0.07)
Basophils Absolute: 0 10*3/uL (ref 0.0–0.1)
Basophils Relative: 0 %
Eosinophils Absolute: 0.3 10*3/uL (ref 0.0–0.5)
Eosinophils Relative: 3 %
HCT: 45.9 % (ref 39.0–52.0)
Hemoglobin: 14.6 g/dL (ref 13.0–17.0)
Immature Granulocytes: 0 %
Lymphocytes Relative: 32 %
Lymphs Abs: 3.7 10*3/uL (ref 0.7–4.0)
MCH: 32 pg (ref 26.0–34.0)
MCHC: 31.8 g/dL (ref 30.0–36.0)
MCV: 100.7 fL — ABNORMAL HIGH (ref 80.0–100.0)
Monocytes Absolute: 1.5 10*3/uL — ABNORMAL HIGH (ref 0.1–1.0)
Monocytes Relative: 13 %
Neutro Abs: 5.9 10*3/uL (ref 1.7–7.7)
Neutrophils Relative %: 52 %
Platelets: 211 10*3/uL (ref 150–400)
RBC: 4.56 MIL/uL (ref 4.22–5.81)
RDW: 12.1 % (ref 11.5–15.5)
WBC: 11.5 10*3/uL — ABNORMAL HIGH (ref 4.0–10.5)
nRBC: 0 % (ref 0.0–0.2)

## 2020-08-25 LAB — I-STAT VENOUS BLOOD GAS, ED
Acid-Base Excess: 0 mmol/L (ref 0.0–2.0)
Bicarbonate: 25.1 mmol/L (ref 20.0–28.0)
Calcium, Ion: 1.2 mmol/L (ref 1.15–1.40)
HCT: 44 % (ref 39.0–52.0)
Hemoglobin: 15 g/dL (ref 13.0–17.0)
O2 Saturation: 97 %
Potassium: 3.7 mmol/L (ref 3.5–5.1)
Sodium: 139 mmol/L (ref 135–145)
TCO2: 26 mmol/L (ref 22–32)
pCO2, Ven: 42.6 mmHg — ABNORMAL LOW (ref 44.0–60.0)
pH, Ven: 7.378 (ref 7.250–7.430)
pO2, Ven: 92 mmHg — ABNORMAL HIGH (ref 32.0–45.0)

## 2020-08-25 LAB — HIV ANTIBODY (ROUTINE TESTING W REFLEX): HIV Screen 4th Generation wRfx: NONREACTIVE

## 2020-08-25 LAB — TROPONIN I (HIGH SENSITIVITY)
Troponin I (High Sensitivity): 11 ng/L (ref <18)
Troponin I (High Sensitivity): 11 ng/L (ref <18)

## 2020-08-25 MED ORDER — IPRATROPIUM-ALBUTEROL 0.5-2.5 (3) MG/3ML IN SOLN
3.0000 mL | Freq: Once | RESPIRATORY_TRACT | Status: AC
  Administered 2020-08-25: 3 mL via RESPIRATORY_TRACT
  Filled 2020-08-25: qty 3

## 2020-08-25 MED ORDER — IPRATROPIUM-ALBUTEROL 0.5-2.5 (3) MG/3ML IN SOLN
3.0000 mL | Freq: Four times a day (QID) | RESPIRATORY_TRACT | Status: DC
  Administered 2020-08-25 (×2): 3 mL via RESPIRATORY_TRACT
  Filled 2020-08-25 (×2): qty 3

## 2020-08-25 MED ORDER — PREDNISONE 20 MG PO TABS: 40.0000 mg | ORAL_TABLET | Freq: Every day | ORAL | Status: DC

## 2020-08-25 MED ORDER — DOXYCYCLINE HYCLATE 100 MG PO TABS
100.0000 mg | ORAL_TABLET | Freq: Two times a day (BID) | ORAL | Status: DC
  Administered 2020-08-25 – 2020-08-28 (×6): 100 mg via ORAL
  Filled 2020-08-25 (×6): qty 1

## 2020-08-25 MED ORDER — UMECLIDINIUM BROMIDE 62.5 MCG/INH IN AEPB
1.0000 | INHALATION_SPRAY | Freq: Every day | RESPIRATORY_TRACT | Status: DC
  Filled 2020-08-25: qty 7

## 2020-08-25 MED ORDER — AMLODIPINE BESYLATE 10 MG PO TABS
10.0000 mg | ORAL_TABLET | Freq: Every day | ORAL | Status: DC
  Administered 2020-08-25 – 2020-08-28 (×4): 10 mg via ORAL
  Filled 2020-08-25 (×3): qty 1
  Filled 2020-08-25: qty 2

## 2020-08-25 MED ORDER — UMECLIDINIUM BROMIDE 62.5 MCG/INH IN AEPB
1.0000 | INHALATION_SPRAY | Freq: Every day | RESPIRATORY_TRACT | Status: DC
  Administered 2020-08-27 – 2020-08-28 (×2): 1 via RESPIRATORY_TRACT
  Filled 2020-08-25: qty 7

## 2020-08-25 MED ORDER — ALBUTEROL SULFATE (2.5 MG/3ML) 0.083% IN NEBU
2.5000 mg | INHALATION_SOLUTION | Freq: Once | RESPIRATORY_TRACT | Status: AC
  Administered 2020-08-25: 2.5 mg via RESPIRATORY_TRACT
  Filled 2020-08-25: qty 3

## 2020-08-25 MED ORDER — IPRATROPIUM-ALBUTEROL 0.5-2.5 (3) MG/3ML IN SOLN
3.0000 mL | RESPIRATORY_TRACT | Status: DC | PRN
  Administered 2020-08-26 – 2020-08-28 (×10): 3 mL via RESPIRATORY_TRACT
  Filled 2020-08-25 (×9): qty 3

## 2020-08-25 MED ORDER — FLUTICASONE FUROATE-VILANTEROL 100-25 MCG/INH IN AEPB
1.0000 | INHALATION_SPRAY | Freq: Every day | RESPIRATORY_TRACT | Status: DC
  Filled 2020-08-25: qty 28

## 2020-08-25 MED ORDER — FLUTICASONE FUROATE-VILANTEROL 100-25 MCG/INH IN AEPB
1.0000 | INHALATION_SPRAY | Freq: Every day | RESPIRATORY_TRACT | Status: DC
  Administered 2020-08-26 – 2020-08-28 (×3): 1 via RESPIRATORY_TRACT
  Filled 2020-08-25: qty 28

## 2020-08-25 MED ORDER — PREDNISONE 20 MG PO TABS
40.0000 mg | ORAL_TABLET | Freq: Every day | ORAL | Status: DC
  Administered 2020-08-26: 40 mg via ORAL
  Filled 2020-08-25: qty 2

## 2020-08-25 MED ORDER — ACETAMINOPHEN 500 MG PO TABS: 1000.0000 mg | ORAL_TABLET | Freq: Four times a day (QID) | ORAL | Status: DC | PRN

## 2020-08-25 MED ORDER — FLUTICASONE-UMECLIDIN-VILANT 100-62.5-25 MCG/INH IN AEPB: 1.0000 "application " | INHALATION_SPRAY | Freq: Every day | RESPIRATORY_TRACT | Status: DC

## 2020-08-25 MED ORDER — SODIUM CHLORIDE 0.9 % IV SOLN: 1.0000 g | INTRAVENOUS | Status: DC

## 2020-08-25 NOTE — H&P (Signed)
[facility] Admission History and Physical Service Pager: [phone]  Patient name: [name] Medical record number: [id number] Date of birth: 02/13/1956 Age: 64 y.o. Gender: male  Primary Care Provider: Danna Hefty, DO Consultants: None Code Status: Full code Preferred Emergency Contact: Fleet Contras [phone]  Chief Complaint: Shortness of breath  Assessment and Plan: [name] is a 64 y.o. male presenting with increased shortness of breath. PMH is significant for COPD, asthma, hypertension, tobacco use.  COPD exacerbation And shortness of breath starting Sunday 10/3.  This morning woke up with severely worsened shortness of breath.  He called EMS and was brought to the emergency department.  In route EMS gave the patient 2 g of magnesium, 10 albuterol, 1 Atrovent, 125 of Solu-Medrol.  He was placed on nonrebreather and O2 sat increased to 88% but patient was still tripoding and and distressed.  She was placed on BiPAP in the emergency department and rapidly improved.  Chest x-ray showed hyperinflated lungs but no focal consolidation.  On evaluation the patient was still on BiPAP but reported he was feeling much better than when he presented.  He reported symptoms started on Sunday and progressively worsened.  He has been unable to sleep for the past 2 days. -Admit to observation with Dr. Lennie Odor attending -Continue BiPAP and transition per RT's recommendations -Continuous pulse ox keeping SPO2 greater than 88% -P.o. prednisone 40 mg daily -Doxycycline 100 mg twice daily -DuoNebs every 6 hours, albuterol every 4 as needed -Continue home Trelegy starting tomorrow -Continue to encourage smoking cessation -PT/OT eval and treat -Ambulate with pulse ox  Hypertension Blood pressures on arrival elevated and have ranged from 148/89-189/116.  Patient's home medications include Norvasc 5 mg daily -Continue home medications -continue to  monitor blood pressure  Tobacco use Patient with history of tobacco abuse but reports that he has considerably cut down on smoking.  He reports that he occasionally drinks alcohol maybe twice a week and will have 2-3 beers.  When he drinks he will also smoke cigarettes but does not smoke any other time. -Offered nicotine replacement -Continue to encourage cessation  FEN/GI: N.p.o. while on BiPAP Prophylaxis: Lovenox  Disposition: Admit to observation  History of Present Illness:  [name] is a 64 y.o. male presenting with increased shortness of breath.  Patient has history of COPD requiring intubations in the past.  He reports that the shortness of breath is increased over the past 2 days as well as having increased sputum production.  Denies any fever but does report he felt cold but never checked his temperature.  Patient reports that this generally happens when the seasons are changing.  Does have home breathing treatments which she was using.  He is currently not regularly smoking but does smoke whenever he drinks alcohol which is approximately 2 days a week.  He reports that he called the family medicine clinic this morning after calling EMS and they gave him some treatments but recommended further evaluation in the emergency department.  Review Of Systems: Per HPI with the following additions:   Review of Systems   Patient Active Problem List   Diagnosis Date Noted  . Encounter for annual physical exam 08/08/2020  . Lung nodule < 6cm on CT 03/19/2019  . Uncontrolled hypertension 03/11/2019  . COPD exacerbation ([place]) 03/09/2019  . Primary hypertension   . Tobacco use disorder 08/04/2009  . Erectile dysfunction 12/31/2008  . COPD (chronic obstructive pulmonary disease) ([place]) 12/31/2008  .  History of colonic polyps 01/18/2007    Past Medical History: Past Medical History:  Diagnosis Date  . Asthma   . COPD (chronic obstructive pulmonary disease) ([place])   . Hypertension    . Shortness of breath     Past Surgical History: Past Surgical History:  Procedure Laterality Date  . COLONOSCOPY      Social History: Social History   Tobacco Use  . Smoking status: Current Some Day Smoker    Packs/day: 0.50    Years: 30.00    Pack years: 15.00    Types: Cigarettes    Last attempt to quit: 08/21/2013    Years since quitting: 7.0  . Smokeless tobacco: Never Used  Substance Use Topics  . Alcohol use: Yes    Comment: rare/occasional  . Drug use: No   Additional social history: Please also refer to relevant sections of EMR.  Family History: Family History  Problem Relation Age of Onset  . Cancer Father     Allergies and Medications: Allergies  Allergen Reactions  . Lisinopril Swelling   No current facility-administered medications on file prior to encounter.   Current Outpatient Medications on File Prior to Encounter  Medication Sig Dispense Refill  . acetaminophen (TYLENOL) 500 MG tablet Take 1,000 mg by mouth as needed for mild pain.    Marland Kitchen albuterol (VENTOLIN HFA) 108 (90 Base) MCG/ACT inhaler INHALE 2 PUFFS EVERY 6 HOURS AS NEEDED FOR WHEEZING OR SHORTNESS OF BREATH 18 g 1  . amLODipine (NORVASC) 10 MG tablet Take 1 tablet (10 mg total) by mouth daily. 90 tablet 3  . Fluticasone-Umeclidin-Vilant (TRELEGY ELLIPTA) 100-62.5-25 MCG/INH AEPB Inhale 1 application into the lungs daily. 3 each 3  . ipratropium-albuterol (DUONEB) 0.5-2.5 (3) MG/3ML SOLN INHALE THE CONTENTS OF 1 VIAL VIA NEBULIZER EVERY 6 HOURS AS NEEDED FOR SHORTNESS OF BREATH / WHEEZING 90 mL 2    Objective: BP (!) 189/116 (BP Location: Left Arm)   Pulse (!) 114   Temp 98.5 F (36.9 C) (Axillary)   Resp (!) 21   Ht 5\' 7"  (1.702 m)   Wt 57.7 kg   SpO2 98%   BMI 19.92 kg/m  Physical Exam Constitutional:      General: He is not in acute distress.    Appearance: Normal appearance. He is not diaphoretic.  HENT:     Head: Normocephalic and atraumatic.     Mouth/Throat:      Mouth: Mucous membranes are moist.  Eyes:     Extraocular Movements: Extraocular movements intact.     Pupils: Pupils are equal, round, and reactive to light.  Cardiovascular:     Rate and Rhythm: Regular rhythm. Tachycardia present.     Pulses: Normal pulses.     Heart sounds: Normal heart sounds.  Pulmonary:     Effort: Respiratory distress present.     Comments: Coarse breath sounds diffusely, decreased breath sounds diffusely, prolonged expiratory phase Abdominal:     General: Abdomen is flat. There is no distension.     Palpations: Abdomen is soft.  Musculoskeletal:        General: Normal range of motion.     Cervical back: Normal range of motion and neck supple. No rigidity or tenderness.  Skin:    General: Skin is warm.     Capillary Refill: Capillary refill takes less than 2 seconds.  Neurological:     General: No focal deficit present.     Mental Status: He is alert.     Labs  and Imaging: CBC BMET  Recent Labs  Lab 08/25/20 1006 08/25/20 1006 08/25/20 1106  WBC 11.5*  --   --   HGB 14.6   < > 15.0  HCT 45.9   < > 44.0  PLT 211  --   --    < > = values in this interval not displayed.   Recent Labs  Lab 08/25/20 1006 08/25/20 1006 08/25/20 1106  NA 140   < > 139  K 3.7   < > 3.7  CL 106  --   --   CO2 22  --   --   BUN 19  --   --   CREATININE 1.50*  --   --   GLUCOSE 110*  --   --   CALCIUM 9.6  --   --    < > = values in this interval not displayed.     EKG: My own interpretation (not copied from electronic read) sinus tachycardia  DG Chest Portable 1 View  Result Date: 08/25/2020 CLINICAL DATA:  Shortness of breath EXAM: PORTABLE CHEST 1 VIEW COMPARISON:  Chest radiograph dated 06/04/2020 and CT chest dated 08/21/2020. FINDINGS: The heart size is normal. Vascular calcifications are seen in the aortic arch. The lungs are hyperinflated without focal consolidation. The right costophrenic angle is incompletely imaged, however no pleural effusion is  visualized on either side. There is no pneumothorax. The visualized skeletal structures are unremarkable. IMPRESSION: Hyperinflated lungs may reflect chronic obstructive pulmonary disease. No focal consolidation. Aortic Atherosclerosis (ICD10-I70.0). Electronically Signed   By: Zerita Boers M.D.   On: 08/25/2020 10:31   Gifford Shave, MD 08/25/2020, 11:52 AM PGY-2, [facility] pager: [phone], text pages welcome

## 2020-08-25 NOTE — Progress Notes (Signed)
PT Cancellation Note  Patient Details Name: [name] MRN: [id number] DOB: Sep 28, 1956   Cancelled Treatment:    Reason Eval/Treat Not Completed: Medical issues which prohibited therapy Pt currently on BiPAP in ED. Will hold until pt medically appropriate and follow up as schedule allows.   Reuel Derby, PT, DPT  Acute Rehabilitation Services  Pager: [phone] Office: [phone]    Rudean Hitt 08/25/2020, 2:30 PM

## 2020-08-25 NOTE — ED Provider Notes (Addendum)
[facility] EMERGENCY DEPARTMENT Provider Note   CSN: [id number] Arrival date & time: 08/25/20  1003     History Chief Complaint  Patient presents with  . Respiratory Distress    [name] is a 64 y.o. male.  HPI   64 y/o male with a h/o asthma/COPD, HTN who present to the ED via EMS for eval of SOB.   Per EMS, on their arrival pt satting in the low 80s on RA with accessory muscle use. Placed on NRB and pt tripoding. Switched to cpap. En route received 2mg , 125 mg solumedrol, 10 albuterol, 1 atrovent.   Pt reports some improvement in sxs following meds given en route. He states he has had increased wheezing/cough for the last few days. He started becoming more SOB overnight. Denies any chest pain or BLE swelling. Denies fevers. Has been fully vaccinated against covid. Hx limited due to acuity of condition. Level 5 caveat applies.   Past Medical History:  Diagnosis Date  . Asthma   . COPD (chronic obstructive pulmonary disease) ([place])   . Hypertension   . Shortness of breath     Patient Active Problem List   Diagnosis Date Noted  . Encounter for annual physical exam 08/08/2020  . Lung nodule < 6cm on CT 03/19/2019  . Uncontrolled hypertension 03/11/2019  . COPD exacerbation ([place]) 03/09/2019  . Primary hypertension   . Tobacco use disorder 08/04/2009  . Erectile dysfunction 12/31/2008  . COPD (chronic obstructive pulmonary disease) ([place]) 12/31/2008  . History of colonic polyps 01/18/2007    Past Surgical History:  Procedure Laterality Date  . COLONOSCOPY         Family History  Problem Relation Age of Onset  . Cancer Father     Social History   Tobacco Use  . Smoking status: Current Some Day Smoker    Packs/day: 0.50    Years: 30.00    Pack years: 15.00    Types: Cigarettes    Last attempt to quit: 08/21/2013    Years since quitting: 7.0  . Smokeless tobacco: Never Used  Substance Use Topics  . Alcohol use: Yes    Comment:  rare/occasional  . Drug use: No    Home Medications Prior to Admission medications   Medication Sig Start Date End Date Taking? Authorizing Provider  acetaminophen (TYLENOL) 500 MG tablet Take 1,000 mg by mouth as needed for mild pain.   Yes [provider]  albuterol (VENTOLIN HFA) 108 (90 Base) MCG/ACT inhaler INHALE 2 PUFFS EVERY 6 HOURS AS NEEDED FOR WHEEZING OR SHORTNESS OF BREATH Patient taking differently: Inhale 2 puffs into the lungs every 6 (six) hours as needed for wheezing or shortness of breath. INHALE 2 PUFFS EVERY 6 HOURS AS NEEDED FOR WHEEZING OR SHORTNESS OF BREATH 08/07/20  Yes Beard, Samantha N, DO  amLODipine (NORVASC) 10 MG tablet Take 1 tablet (10 mg total) by mouth daily. 07/10/20  Yes Mullis, Kiersten P, DO  Fluticasone-Umeclidin-Vilant (TRELEGY ELLIPTA) 100-62.5-25 MCG/INH AEPB Inhale 1 application into the lungs daily. Patient taking differently: Inhale 1 puff into the lungs daily.  08/06/20  Yes Mullis, Kiersten P, DO  ipratropium-albuterol (DUONEB) 0.5-2.5 (3) MG/3ML SOLN INHALE THE CONTENTS OF 1 VIAL VIA NEBULIZER EVERY 6 HOURS AS NEEDED FOR SHORTNESS OF BREATH / WHEEZING Patient taking differently: Take 3 mLs by nebulization every 6 (six) hours as needed (shortness of breath/ wheezing). INHALE THE CONTENTS OF 1 VIAL VIA NEBULIZER EVERY 6 HOURS AS NEEDED FOR SHORTNESS  OF BREATH / WHEEZING 07/10/20  Yes Mullis, Kiersten P, DO    Allergies    Lisinopril and Spiriva respimat [tiotropium bromide monohydrate]  Review of Systems   Review of Systems  Unable to perform ROS: Acuity of condition  Constitutional: Negative for fever.  Respiratory: Positive for cough, shortness of breath and wheezing.   Cardiovascular: Negative for palpitations and leg swelling.  All other systems reviewed and are negative.   Physical Exam Updated Vital Signs BP (!) 148/89   Pulse 89   Temp 98.5 F (36.9 C) (Axillary)   Resp 17   Ht 5\' 7"  (1.702 m)   Wt 57.7 kg   SpO2 95%    BMI 19.92 kg/m   Physical Exam Vitals and nursing note reviewed.  Constitutional:      Appearance: He is well-developed. He is ill-appearing.  HENT:     Head: Normocephalic and atraumatic.  Eyes:     Conjunctiva/sclera: Conjunctivae normal.  Cardiovascular:     Rate and Rhythm: Regular rhythm. Tachycardia present.     Heart sounds: No murmur heard.   Pulmonary:     Breath sounds: Wheezing present.     Comments: Tachypneic, speaking in 1-2 word sentences. Using accessory muscles Abdominal:     Palpations: Abdomen is soft.     Tenderness: There is no abdominal tenderness.  Musculoskeletal:     Cervical back: Neck supple.     Right lower leg: No edema.     Left lower leg: No edema.  Skin:    General: Skin is warm and dry.  Neurological:     Mental Status: He is alert.     ED Results / Procedures / Treatments   Labs (all labs ordered are listed, but only abnormal results are displayed) Labs Reviewed  CBC WITH DIFFERENTIAL/PLATELET - Abnormal; Notable for the following components:      Result Value   WBC 11.5 (*)    MCV 100.7 (*)    Monocytes Absolute 1.5 (*)    All other components within normal limits  BASIC METABOLIC PANEL - Abnormal; Notable for the following components:   Glucose, Bld 110 (*)    Creatinine, Ser 1.50 (*)    GFR calc non Af Amer 48 (*)    All other components within normal limits  I-STAT VENOUS BLOOD GAS, ED - Abnormal; Notable for the following components:   pCO2, Ven 42.6 (*)    pO2, Ven 92.0 (*)    All other components within normal limits  RESP PANEL BY RT PCR (RSV, FLU A&B, COVID)  HIV ANTIBODY (ROUTINE TESTING W REFLEX)  TROPONIN I (HIGH SENSITIVITY)  TROPONIN I (HIGH SENSITIVITY)    EKG EKG Interpretation  Date/Time:  Tuesday August 25 2020 10:58:11 EDT Ventricular Rate:  102 PR Interval:    QRS Duration: 101 QT Interval:  373 QTC Calculation: 486 R Axis:   90 Text Interpretation: Sinus tachycardia Borderline right axis  deviation Probable anteroseptal infarct, old Confirmed by Gerlene Fee [phone]) on 08/25/2020 2:04:28 PM   Radiology DG Chest Portable 1 View  Result Date: 08/25/2020 CLINICAL DATA:  Shortness of breath EXAM: PORTABLE CHEST 1 VIEW COMPARISON:  Chest radiograph dated 06/04/2020 and CT chest dated 08/21/2020. FINDINGS: The heart size is normal. Vascular calcifications are seen in the aortic arch. The lungs are hyperinflated without focal consolidation. The right costophrenic angle is incompletely imaged, however no pleural effusion is visualized on either side. There is no pneumothorax. The visualized skeletal structures are unremarkable. IMPRESSION: Hyperinflated  lungs may reflect chronic obstructive pulmonary disease. No focal consolidation. Aortic Atherosclerosis (ICD10-I70.0). Electronically Signed   By: Zerita Boers M.D.   On: 08/25/2020 10:31    Procedures Procedures (including critical care time)  CRITICAL CARE Performed by: Rodney Booze   Total critical care time: 33 minutes  Critical care time was exclusive of separately billable procedures and treating other patients.  Critical care was necessary to treat or prevent imminent or life-threatening deterioration.  Critical care was time spent personally by me on the following activities: development of treatment plan with patient and/or surrogate as well as nursing, discussions with consultants, evaluation of patient's response to treatment, examination of patient, obtaining history from patient or surrogate, ordering and performing treatments and interventions, ordering and review of laboratory studies, ordering and review of radiographic studies, pulse oximetry and re-evaluation of patient's condition.   Medications Ordered in ED Medications  acetaminophen (TYLENOL) tablet 1,000 mg (has no administration in time range)  amLODipine (NORVASC) tablet 10 mg (has no administration in time range)  Fluticasone-Umeclidin-Vilant  100-62.5-25 MCG/INH AEPB 1 application (has no administration in time range)  predniSONE (DELTASONE) tablet 40 mg (has no administration in time range)  ipratropium-albuterol (DUONEB) 0.5-2.5 (3) MG/3ML nebulizer solution 3 mL (3 mLs Nebulization Given 08/25/20 1326)  doxycycline (VIBRA-TABS) tablet 100 mg (has no administration in time range)  ipratropium-albuterol (DUONEB) 0.5-2.5 (3) MG/3ML nebulizer solution 3 mL (3 mLs Nebulization Given 08/25/20 1017)  albuterol (PROVENTIL) (2.5 MG/3ML) 0.083% nebulizer solution 2.5 mg (2.5 mg Nebulization Given 08/25/20 1114)    ED Course  I have reviewed the triage vital signs and the nursing notes.  Pertinent labs & imaging results that were available during my care of the patient were reviewed by me and considered in my medical decision making (see chart for details).    MDM Rules/Calculators/A&P                          64 y/o M presenting for eval of SOB. H/o COPD.  EMS noted sats in low 80s, using accessory muscles and tripoding. Requiring cpap en route. Placed on BIPAP on arrival.   Reviewed/interpreted labs CBC with mild cytosis at 11 BMP with mildly elevated creatinine at 1.5, this appears to be somewhat near his baseline.  Otherwise unremarkable Trop negative COVID negative  VBG with no evidence of acidemia, PCO2 is somewhat low at 42.6.  EKG with sinus tach, borderline right axis deviation and old anteroseptal infarct.  CXR -  Hyperinflated lungs may reflect chronic obstructive pulmonary disease. No focal consolidation. Aortic Atherosclerosis  10:14 AM Reassessed pt. He states he is feeling much better after meds.   Pt with COPD exacerbation requiring bipap. Received 125 solumedrol, 2 mag, albuterol/atrovent. Sxs have improved somewhat but will require admission for further tx of acute respiratory failure.   11:48 AM CONSULT with Dr. Kathrynn Running with family medicine residency who accepts patient for admission.    Final Clinical  Impression(s) / ED Diagnoses Final diagnoses:  COPD exacerbation [facility])    Rx / DC Orders ED Discharge Orders    None       Rodney Booze, PA-C 08/25/20 1404    Wagner Tanzi S, PA-C 08/25/20 1405    Maudie Flakes, MD 08/27/20 2259

## 2020-08-25 NOTE — Progress Notes (Signed)
FPTS Interim Progress Note  S: Interviewed patient at bedside while he had his BiPAP on.  He reports being frustrated that his medications where not ordered how he usually takes them. States he knows what needs to be done to improve his breathing. States his medications are not ordered to be frequent enough. Discussed that team would change his medications.  O: BP (!) 168/107   Pulse 81   Temp 98.5 F (36.9 C) (Axillary)   Resp 19   Ht 5\' 7"  (1.702 m)   Wt 57.7 kg   SpO2 98%   BMI 19.92 kg/m   Physical Exam Vitals and nursing note reviewed.  Constitutional:      General: He is not in acute distress.    Appearance: Normal appearance. He is normal weight. He is not ill-appearing, toxic-appearing or diaphoretic.  HENT:     Head: Normocephalic and atraumatic.  Cardiovascular:     Rate and Rhythm: Normal rate and regular rhythm.     Pulses: Normal pulses.          Radial pulses are 2+ on the right side and 2+ on the left side.       Dorsalis pedis pulses are 2+ on the right side and 2+ on the left side.     Heart sounds: Normal heart sounds, S1 normal and S2 normal. No murmur heard.   Pulmonary:     Effort: No respiratory distress.     Breath sounds: Wheezing present.     Comments: On BiPAP Abdominal:     General: There is no distension.     Tenderness: There is no abdominal tenderness.  Musculoskeletal:     Right lower leg: No edema.     Left lower leg: No edema.  Neurological:     Mental Status: He is alert.      A/P: COPD Exacerbation: He reports that when these happen he takes his medications more frequently than what is currently ordered and they improve his breathing.  -Increase his Duonebs from q6 to q2 hours -Restarted his home Breo Ellipta 1 puff now then 1 puff daily   Briant Cedar, MD 08/25/2020, 10:13 PM PGY-1, [place] Medicine Service pager [phone]

## 2020-08-25 NOTE — ED Notes (Signed)
Called RT requesting assistance with Duo Neb [place] administration. Pt on BiPap.

## 2020-08-25 NOTE — Telephone Encounter (Signed)
Received return call from [facility] with [facility]. Paramedic reports that patient has decreased lung sounds, is currently hypertensive at 170/90s, 95% SpO2. Patient is requesting to be evaluated in our office. Due to patient's cold symptoms, he would have to be evaluated in [place] clinic, which has no openings until Friday. Advised that patient be further evaluated at ED. Paramedic will transport patient to [facility] ED for further evaluation.   Talbot Grumbling, RN

## 2020-08-25 NOTE — ED Triage Notes (Signed)
Pt arrives via EMS from home with complaints of respiratory distress. History of COPD. Pt increased wheezing X2 days. EMS arrives pt Sp02 82% RA. Placed NRB increased to 88% but pt tripoding and still in distress. CPAP applied.  2g Mag given 10 albuteral 1 adrovent 125 solumedrol  Pt alert and oriented X4

## 2020-08-25 NOTE — Telephone Encounter (Signed)
Yes he definitely needs to be evaluated today. Thanks for instructing him to the soonest opportunity.

## 2020-08-25 NOTE — Progress Notes (Signed)
Transported patient from ED to 2C03 without event.

## 2020-08-25 NOTE — ED Notes (Signed)
Called respiratory requesting pt placed back on bipap after finding pt SOB with RR low 30s.

## 2020-08-25 NOTE — Telephone Encounter (Signed)
Patient LVM on nurse line due to cold symptoms and difficulty breathing.   Returned call to patient. At this time, EMS is evaluating patient. Patient will return call to office if he needs to schedule follow up appointment.   FYI to PCP  Talbot Grumbling, RN

## 2020-08-26 DIAGNOSIS — J441 Chronic obstructive pulmonary disease with (acute) exacerbation: Secondary | ICD-10-CM | POA: Diagnosis not present

## 2020-08-26 LAB — CBC
HCT: 42.9 % (ref 39.0–52.0)
Hemoglobin: 14.1 g/dL (ref 13.0–17.0)
MCH: 33.1 pg (ref 26.0–34.0)
MCHC: 32.9 g/dL (ref 30.0–36.0)
MCV: 100.7 fL — ABNORMAL HIGH (ref 80.0–100.0)
Platelets: 224 10*3/uL (ref 150–400)
RBC: 4.26 MIL/uL (ref 4.22–5.81)
RDW: 12.1 % (ref 11.5–15.5)
WBC: 5.7 10*3/uL (ref 4.0–10.5)
nRBC: 0 % (ref 0.0–0.2)

## 2020-08-26 LAB — BASIC METABOLIC PANEL
Anion gap: 10 (ref 5–15)
BUN: 20 mg/dL (ref 8–23)
CO2: 22 mmol/L (ref 22–32)
Calcium: 9.6 mg/dL (ref 8.9–10.3)
Chloride: 105 mmol/L (ref 98–111)
Creatinine, Ser: 1.39 mg/dL — ABNORMAL HIGH (ref 0.61–1.24)
GFR calc non Af Amer: 53 mL/min — ABNORMAL LOW (ref >60)
Glucose, Bld: 133 mg/dL — ABNORMAL HIGH (ref 70–99)
Potassium: 4.7 mmol/L (ref 3.5–5.1)
Sodium: 137 mmol/L (ref 135–145)

## 2020-08-26 LAB — MRSA PCR SCREENING: MRSA by PCR: NEGATIVE

## 2020-08-26 MED ORDER — ENOXAPARIN SODIUM 40 MG/0.4ML ~~LOC~~ SOLN
40.0000 mg | SUBCUTANEOUS | Status: DC
  Administered 2020-08-26 – 2020-08-27 (×2): 40 mg via SUBCUTANEOUS
  Filled 2020-08-26 (×3): qty 0.4

## 2020-08-26 MED ORDER — METHYLPREDNISOLONE SODIUM SUCC 125 MG IJ SOLR
125.0000 mg | Freq: Every day | INTRAMUSCULAR | Status: DC
  Administered 2020-08-26: 125 mg via INTRAVENOUS
  Filled 2020-08-26: qty 2

## 2020-08-26 NOTE — Evaluation (Addendum)
Occupational Therapy Evaluation Patient Details Name: [name] MRN: [id number] DOB: 06/09/1956 Today's Date: 08/26/2020    History of Present Illness Pt is a 64 year old man admitted with COPD exacerbation. Other PMH includes asthma, HTN and tobacco use.    Clinical Impression   Pt is functioning modified independently to independently. Pt is familiar with pursed lip breathing, reinforced to use during exertion. Educated in energy conservation and pacing. Pt receptive. Pt currently on 4L 02 via [place] with Sp02 >90%. No further OT needs.     Follow Up Recommendations  No OT follow up    Equipment Recommendations  None recommended by OT    Recommendations for Other Services       Precautions / Restrictions Precautions Precautions: None      Mobility Bed Mobility Overal bed mobility: Independent                Transfers Overall transfer level: Independent Equipment used: None                  Balance Overall balance assessment: No apparent balance deficits (not formally assessed)                                         ADL either performed or assessed with clinical judgement   ADL Overall ADL's : Modified independent                                             Vision Baseline Vision/History: No visual deficits Patient Visual Report: No change from baseline       Perception     Praxis      Pertinent Vitals/Pain Pain Assessment: No/denies pain     Hand Dominance Right   Extremity/Trunk Assessment Upper Extremity Assessment Upper Extremity Assessment: Overall WFL for tasks assessed   Lower Extremity Assessment Lower Extremity Assessment: Overall WFL for tasks assessed   Cervical / Trunk Assessment Cervical / Trunk Assessment: Normal   Communication Communication Communication: No difficulties   Cognition Arousal/Alertness: Awake/alert Behavior During Therapy: WFL for tasks  assessed/performed Overall Cognitive Status: Within Functional Limits for tasks assessed                                     General Comments       Exercises     Shoulder Instructions      Home Living Family/patient expects to be discharged to:: Private residence Living Arrangements: Spouse/significant other Available Help at Discharge: Family;Available PRN/intermittently (wife works) Type of Home: House Home Access: Stairs to enter Technical brewer of Steps: 2   Home Layout: One level     Bathroom Shower/Tub: Teacher, early years/pre: [place]: None          Prior Functioning/Environment Level of Independence: Independent        Comments: retired, mows his grass, walks daily        OT Problem List:        OT Treatment/Interventions:      OT Goals(Current goals can be found in the care plan section) Acute Rehab OT Goals Patient Stated Goal: breathe better  OT Frequency:  Barriers to D/C:            Co-evaluation              AM-PAC OT "6 Clicks" Daily Activity     Outcome Measure Help from another person eating meals?: None Help from another person taking care of personal grooming?: None Help from another person toileting, which includes using toliet, bedpan, or urinal?: None Help from another person bathing (including washing, rinsing, drying)?: None Help from another person to put on and taking off regular upper body clothing?: None Help from another person to put on and taking off regular lower body clothing?: None 6 Click Score: 24   End of Session Equipment Utilized During Treatment: Oxygen (4L )  Activity Tolerance: Patient tolerated treatment well Patient left: in chair;with call bell/phone within reach;with nursing/sitter in room  OT Visit Diagnosis: Other (comment) (decreased activity tolerance)                Time: 9449-6759 OT Time Calculation (min): 18 min Charges:  OT  General Charges $OT Visit: 1 Visit OT Evaluation $OT Eval Low Complexity: 1 Low  Nestor Lewandowsky, OTR/L Acute Rehabilitation Services Pager: [phone] Office: [phone]  Malka So 08/26/2020, 9:03 AM

## 2020-08-26 NOTE — Progress Notes (Signed)
Pt didn't want to wear BiPAP tonight, I told him to let the nurse know if he wanted to go on it and told her to call.

## 2020-08-26 NOTE — Progress Notes (Signed)
Family Medicine Teaching Service Daily Progress Note Intern Pager: [phone]  Patient name: [name] Medical record number: [id number] Date of birth: 11-10-1956 Age: 64 y.o. Gender: male  Primary Care Provider: Danna Hefty, DO Consultants: None  Code Status: FULL  Pt Overview and Major Events to Date:  Admitted 08/26/20  Assessment and Plan: [name] is a 64 y.o. male presenting with increased shortness of breath. PMH is significant for COPD, asthma, hypertension, tobacco use.  COPD exacerbation Shortness of breath starting Sunday 10/3 that progressively worsened. Chest x-ray showed hyperinflated lungs but no focal consolidation. Of note, patient has required intubation on 3 different occasions in 2020. On evaluation today the patient was still on BiPAP and able to talk in complete sentences. Endorsed an episode of SOB this morning when walking to the restroom. Significant wheezing appreciated  -Continue BiPAP and transition per RT's recommendations -Continuous pulse ox keeping SPO2 greater than 88% -P.o. prednisone 40 mg daily -Doxycycline 100 mg twice daily -DuoNebs changed from q 6 hours to q 2 hours prn, albuterol every 4 as needed -Continue home Trelegy: Incruse Ellipta and Breo Ellipta 1 puff daily -PT/OT eval and treat  Hypertension Blood pressures on arrival elevated and have ranged from 148/89-189/116.  Patient's home medications include Norvasc 5 mg daily -Continue home medications -continue to monitor blood pressure  Tobacco use Patient with history of tobacco abuse but reports that he has considerably cut down on smoking.  He reports that he occasionally drinks alcohol maybe twice a week and will have 2-3 beers.  When he drinks he will also smoke cigarettes but does not smoke any other time. - Nicotine replacement if pt desires  -Continue to encourage cessation  FEN/GI: NPO while on BiPAP Prophylaxis: Lovenox  Disposition: Observation    Subjective:   Patient on bipap when I assessed him. Able to talk in complete sentences. Endorsed feeling short of breath when walking from the restroom this morning and had to stop. He expresses frustration that he did not get his prednisone and antibiotics yet and he states he knows this will help him because he knows his body. H  Objective: Temp:  [98.5 F (36.9 C)-98.6 F (37 C)] 98.6 F (37 C) (10/06 0500) Pulse Rate:  [73-114] 73 (10/06 0355) Resp:  [17-25] 17 (10/06 0355) BP: (141-189)/(82-148) 141/82 (10/06 0355) SpO2:  [95 %-100 %] 96 % (10/06 0355) FiO2 (%):  [28 %-50 %] 40 % (10/06 0047) Weight:  [55.6 kg-57.7 kg] 55.6 kg (10/05 2216) Physical Exam: General: alert, on bipap, NAD  Cardiovascular: RRR no murmurs  Respiratory: wheezing in UL, course breath sounds diffusely  Abdomen: flat, Non tender Extremities:warm, dry. No edema. Distal pulses 2+  Laboratory: Recent Labs  Lab 08/25/20 1006 08/25/20 1106 08/26/20 0113  WBC 11.5*  --  5.7  HGB 14.6 15.0 14.1  HCT 45.9 44.0 42.9  PLT 211  --  224   Recent Labs  Lab 08/25/20 1006 08/25/20 1106 08/26/20 0113  NA 140 139 137  K 3.7 3.7 4.7  CL 106  --  105  CO2 22  --  22  BUN 19  --  20  CREATININE 1.50*  --  1.39*  CALCIUM 9.6  --  9.6  GLUCOSE 110*  --  133*    Imaging/Diagnostic Tests: DG Chest Portable 1 View Result Date: 08/25/2020 IMPRESSION: Hyperinflated lungs may reflect chronic obstructive pulmonary disease. No focal consolidation. Aortic Atherosclerosis (ICD10-I70.0).  Shary Key, DO 08/26/2020, 7:03 AM  PGY-1, [place] Intern pager: [phone], text pages welcome

## 2020-08-26 NOTE — Evaluation (Signed)
Physical Therapy Evaluation Patient Details Name: [name] MRN: [id number] DOB: 1956-11-09 Today's Date: 08/26/2020   History of Present Illness  64 y.o. male presenting with increased shortness of breath. PMH is significant for COPD, asthma, hypertension, tobacco use.  Clinical Impression  Pt presents to PT with deficits in cardiopulmonary function and endurance. Pt demonstrates increased work of breathing and reports SOB with community ambulation at this time while utilizing 4L/min [place] supplemental oxygen. Pt ambulates without noticeable gait or balance deviation, only requiring PT supervision for line management and to monitor symptoms. Pt will benefit from continued acute PT POC to continue progressing upon activity tolerance and to aide in a complete return to independence.    Follow Up Recommendations No PT follow up    Equipment Recommendations  None recommended by PT    Recommendations for Other Services       Precautions / Restrictions Precautions Precautions: Other (comment) Precaution Comments: monitor SpO2 Restrictions Weight Bearing Restrictions: No      Mobility  Bed Mobility Overal bed mobility: Independent                Transfers Overall transfer level: Independent Equipment used: None                Ambulation/Gait Ambulation/Gait assistance: Supervision Gait Distance (Feet): 300 Feet Assistive device: None Gait Pattern/deviations: Step-through pattern Gait velocity: functional Gait velocity interpretation: 1.31 - 2.62 ft/sec, indicative of limited community ambulator General Gait Details: pt with steady step through gait, supervision provided for oygen and line management as well as to monitor symptoms  Stairs            Wheelchair Mobility    Modified Rankin (Stroke Patients Only)       Balance Overall balance assessment: Mild deficits observed, not formally tested                                            Pertinent Vitals/Pain Pain Assessment: No/denies pain    Home Living Family/patient expects to be discharged to:: Private residence Living Arrangements: Spouse/significant other Available Help at Discharge: Family;Available PRN/intermittently Type of Home: House Home Access: Stairs to enter Entrance Stairs-Rails: Can reach both Entrance Stairs-Number of Steps: 4 Home Layout: One level Home Equipment: None      Prior Function Level of Independence: Independent         Comments: retired, mows his grass, walks daily     Hand Dominance   Dominant Hand: Right    Extremity/Trunk Assessment   Upper Extremity Assessment Upper Extremity Assessment: Overall WFL for tasks assessed    Lower Extremity Assessment Lower Extremity Assessment: Overall WFL for tasks assessed    Cervical / Trunk Assessment Cervical / Trunk Assessment: Normal  Communication   Communication: No difficulties  Cognition Arousal/Alertness: Awake/alert Behavior During Therapy: WFL for tasks assessed/performed Overall Cognitive Status: Within Functional Limits for tasks assessed                                        General Comments General comments (skin integrity, edema, etc.): pt on 4L [place] upon PT arrival, pt desaturates to 90% when ambulating on 4L, reports SOB, takes standing rest break without improvement in sats, PT increases supplemental O2 to 6L/min with improvement in sats to  high 90s, PT then returns pt to 4L [place]. Pt left on 4L [place] with sats in mid 90s at rest    Exercises     Assessment/Plan    PT Assessment Patient needs continued PT services  PT Problem List Cardiopulmonary status limiting activity       PT Treatment Interventions Therapeutic exercise;Therapeutic activities;Gait training;Stair training;Patient/family education    PT Goals (Current goals can be found in the Care Plan section)  Acute Rehab PT Goals Patient Stated Goal: To return to  independence PT Goal Formulation: With patient Time For Goal Achievement: 09/09/20 Potential to Achieve Goals: Good Additional Goals Additional Goal #1: Pt will report a 4/10 or less on BORG dyspnea scale when ambulating over 125'    Frequency Min 3X/week   Barriers to discharge        Co-evaluation               AM-PAC PT "6 Clicks" Mobility  Outcome Measure Help needed turning from your back to your side while in a flat bed without using bedrails?: None Help needed moving from lying on your back to sitting on the side of a flat bed without using bedrails?: None Help needed moving to and from a bed to a chair (including a wheelchair)?: None Help needed standing up from a chair using your arms (e.g., wheelchair or bedside chair)?: None Help needed to walk in hospital room?: None Help needed climbing 3-5 steps with a railing? : None 6 Click Score: 24    End of Session Equipment Utilized During Treatment: Oxygen Activity Tolerance: Patient limited by fatigue Patient left: in bed;with call bell/phone within reach Nurse Communication: Mobility status PT Visit Diagnosis: Other (comment) (cardiopulmonary endurance)    Time: 0768-0881 PT Time Calculation (min) (ACUTE ONLY): 15 min   Charges:   PT Evaluation $PT Eval Low Complexity: 1 Low          Zenaida Niece, PT, DPT Acute Rehabilitation Pager: [phone]   Zenaida Niece 08/26/2020, 3:51 PM

## 2020-08-27 ENCOUNTER — Encounter (HOSPITAL_COMMUNITY): Payer: Self-pay | Admitting: Family Medicine

## 2020-08-27 ENCOUNTER — Other Ambulatory Visit: Payer: Self-pay | Admitting: Family Medicine

## 2020-08-27 DIAGNOSIS — J449 Chronic obstructive pulmonary disease, unspecified: Secondary | ICD-10-CM

## 2020-08-27 DIAGNOSIS — J441 Chronic obstructive pulmonary disease with (acute) exacerbation: Secondary | ICD-10-CM | POA: Diagnosis not present

## 2020-08-27 MED ORDER — PNEUMOCOCCAL VAC POLYVALENT 25 MCG/0.5ML IJ INJ
0.5000 mL | INJECTION | INTRAMUSCULAR | Status: AC
  Administered 2020-08-28: 0.5 mL via INTRAMUSCULAR
  Filled 2020-08-27: qty 0.5

## 2020-08-27 MED ORDER — PREDNISONE 20 MG PO TABS
40.0000 mg | ORAL_TABLET | Freq: Every day | ORAL | Status: DC
  Administered 2020-08-27 – 2020-08-28 (×2): 40 mg via ORAL
  Filled 2020-08-27 (×2): qty 2

## 2020-08-27 MED ORDER — NICOTINE POLACRILEX 2 MG MT GUM
2.0000 mg | CHEWING_GUM | OROMUCOSAL | Status: DC | PRN
  Filled 2020-08-27: qty 1

## 2020-08-27 NOTE — Progress Notes (Signed)
Family Medicine Teaching Service Daily Progress Note Intern Pager: [phone]  Patient name: [name] Medical record number: [id number] Date of birth: [birth year] Age: 64 y.o. Gender: male  Primary Care Provider: Danna Hefty, DO Consultants: None Code Status: FULL  Pt Overview and Major Events to Date:  Admitted 10/5  Assessment and Plan: [name] is a 64 y.o. male presenting with increased shortness of breath. PMH is significant for COPD, asthma, hypertension, tobacco use.  COPD exacerbation Shortness of breath starting Sunday 10/3 that progressively worsened. Chest x-ray showed hyperinflated lungs but no focal consolidation. Received IV solumedrol 125mg  yesterday. Has used his duonebs about every 4 hours today.    -Continuous pulse ox keeping SPO2 greater than 88% - P.o. prednisone 40 mg daily -Doxycycline 100 mg twice daily (10/5-) -DuoNebs changed from q 6 hours to q 2 hours prn, albuterol every 4 as needed -Continue home Trelegy: Incruse Ellipta and Breo Ellipta 1 puff daily -PT/OT evaluated- no further tx needed   Hypertension Blood pressures on arrival elevated and have ranged from 148/89-189/116.  Patient's home medications include Norvasc 10 mg daily -Continue home medications -continue to monitor blood pressure  Tobacco use Patient with history of tobacco abuse but reports that he has considerably cut down on smoking.  He reports that he occasionally drinks alcohol maybe twice a week and will have 2-3 beers.  When he drinks he will also smoke cigarettes but does not smoke any other time. Encouraged smoking cessation and he feels motivated to do so.  - Nicorette gum 2mg  prn   FEN/GI: NPO while on BiPAP Prophylaxis: Lovenox  Disposition: Home possibly tomorrow   Subjective:   Patient reports feeling better this morning and states taking his medication yesterday really helped. He states that he will occasionally remove his nasal cannula and feel  ok. He was fine with bumping his O2 down to 2L and going from there. He   Objective: Temp:  [98 F (36.7 C)-98.1 F (36.7 C)] 98 F (36.7 C) (10/06 1605) Pulse Rate:  [77-90] 80 (10/07 0300) Resp:  [13-21] 14 (10/07 0300) BP: (124-188)/(68-99) 162/87 (10/07 0300) SpO2:  [96 %-99 %] 98 % (10/07 0300) FiO2 (%):  [40 %] 40 % (10/06 0804) Physical Exam: General: alert, on 2LNC, NAD Cardiovascular: RRR no murmurs  Respiratory: Inspiratory and expiratory wheezing. Some excessory muscle use  Abdomen: flat, non tender Extremities: warm, dry. Distal pulses 2+ bilaterally  Laboratory: Recent Labs  Lab 08/25/20 1006 08/25/20 1106 08/26/20 0113  WBC 11.5*  --  5.7  HGB 14.6 15.0 14.1  HCT 45.9 44.0 42.9  PLT 211  --  224   Recent Labs  Lab 08/25/20 1006 08/25/20 1106 08/26/20 0113  NA 140 139 137  K 3.7 3.7 4.7  CL 106  --  105  CO2 22  --  22  BUN 19  --  20  CREATININE 1.50*  --  1.39*  CALCIUM 9.6  --  9.6  GLUCOSE 110*  --  133*     Imaging/Diagnostic Tests:  None new   Shary Key, DO 08/27/2020, 7:07 AM PGY-1, [place] Intern pager: [phone], text pages welcome

## 2020-08-27 NOTE — Progress Notes (Signed)
FPTS Interim Progress Note  Spoke to patient regarding pulmonology referral. He is very amendable to the referral and is hopeful it will help him stay out of the hospital. Referral placed.   Patient doing well on room air and is hopeful to discharge home tomorrow.  All questions were addressed appropriately.  Mina Marble [place], DO 08/27/2020, 8:07 PM PGY-3, [place] Medicine Service pager [phone]

## 2020-08-27 NOTE — Progress Notes (Signed)
Report given to Roselyn Reef, RN on 6N. Pt VSS. Pt belongings placed in pt belongings bag. NT transferred pt to 6N31 via wheelchair.

## 2020-08-28 ENCOUNTER — Encounter: Payer: Self-pay | Admitting: Family Medicine

## 2020-08-28 DIAGNOSIS — J441 Chronic obstructive pulmonary disease with (acute) exacerbation: Secondary | ICD-10-CM | POA: Diagnosis not present

## 2020-08-28 LAB — BASIC METABOLIC PANEL
Anion gap: 10 (ref 5–15)
BUN: 33 mg/dL — ABNORMAL HIGH (ref 8–23)
CO2: 25 mmol/L (ref 22–32)
Calcium: 9.7 mg/dL (ref 8.9–10.3)
Chloride: 103 mmol/L (ref 98–111)
Creatinine, Ser: 1.53 mg/dL — ABNORMAL HIGH (ref 0.61–1.24)
GFR calc non Af Amer: 47 mL/min — ABNORMAL LOW (ref >60)
Glucose, Bld: 91 mg/dL (ref 70–99)
Potassium: 4.1 mmol/L (ref 3.5–5.1)
Sodium: 138 mmol/L (ref 135–145)

## 2020-08-28 LAB — CBC
HCT: 43.1 % (ref 39.0–52.0)
Hemoglobin: 14.1 g/dL (ref 13.0–17.0)
MCH: 32.9 pg (ref 26.0–34.0)
MCHC: 32.7 g/dL (ref 30.0–36.0)
MCV: 100.5 fL — ABNORMAL HIGH (ref 80.0–100.0)
Platelets: 257 10*3/uL (ref 150–400)
RBC: 4.29 MIL/uL (ref 4.22–5.81)
RDW: 12 % (ref 11.5–15.5)
WBC: 10.5 10*3/uL (ref 4.0–10.5)
nRBC: 0 % (ref 0.0–0.2)

## 2020-08-28 MED ORDER — PREDNISONE 10 MG PO TABS: ORAL_TABLET | ORAL | 0 refills | Status: AC

## 2020-08-28 NOTE — Progress Notes (Signed)
PT Cancellation Note  Patient Details Name: [name] MRN: [id number] DOB: Dec 07, 1955   Cancelled Treatment:    Reason Eval/Treat Not Completed: Other (comment) per RN, patient discharging soon. Per chart review, patient highly mobile and has been mobilizing with nursing without difficulty. Holding PT for now. If patient has urgent rehab needs, please direct message this therapist in [place] (8am-4pm) or page at number below.    Windell Norfolk, DPT, PN1   Supplemental Physical Therapist [facility]    Pager [phone] Acute Rehab Office [phone]

## 2020-08-28 NOTE — Progress Notes (Signed)
Family Medicine Teaching Service Daily Progress Note Intern Pager: [phone]  Patient name: [name] Medical record number: [id number] Date of birth: [birth year] Age: 64 y.o. Gender: male  Primary Care Provider: Danna Hefty, DO Consultants: None  Code Status: Full   Pt Overview and Major Events to Date:  Admitted 10/5 on BiPap to Progressive  10/7 Moved to Med-Tele  Assessment and Plan:  64 year old with history of tobacco abuse presenting with severe exacerbation of COPD.  Severe COPD Exacerbation, causing hypoxemic respiratory failure, now improved.  - Prednisone 40 mg daily with taper as outpatient given previous history of rebound symptoms - Doxycycline end of therapy 10/8 - Ambulatory pulse oximetry  HTN - Continue Norvasc 10 mg, repeat as outpatient, consider addition of chlorthalidone or spironolactone if still elevated  Tobacco abuse, encouraged cessation    CKD IIIa, stable   FEN/GI: Regular PPx: Lovenox  Discharge today.     Subjective:  Patient reports he feels well. Denies chest pain. Endorses cough, which he reports is 'end of exacerbation usually'  Objective: Temp:  [97.6 F (36.4 C)-98.4 F (36.9 C)] 98.4 F (36.9 C) (10/08 0421) Pulse Rate:  [75-95] 95 (10/08 0421) Resp:  [15-19] 19 (10/08 0421) BP: (128-161)/(87-98) 158/91 (10/08 0421) SpO2:  [97 %-99 %] 99 % (10/08 0421) Physical Exam: General: Calm, no tachypnea Cardiovascular: RRR Respiratory: Lungs coarse bilaterally improved air entry Abdomen: Distended, soft, nontender  Extremities: Trace edema   Laboratory: Recent Labs  Lab 08/25/20 1006 08/25/20 1006 08/25/20 1106 08/26/20 0113 08/28/20 0025  WBC 11.5*  --   --  5.7 10.5  HGB 14.6   < > 15.0 14.1 14.1  HCT 45.9   < > 44.0 42.9 43.1  PLT 211  --   --  224 257   < > = values in this interval not displayed.   Recent Labs  Lab 08/25/20 1006 08/25/20 1006 08/25/20 1106 08/26/20 0113 08/28/20 0025  NA 140   <  > 139 137 138  K 3.7   < > 3.7 4.7 4.1  CL 106  --   --  105 103  CO2 22  --   --  22 25  BUN 19  --   --  20 33*  CREATININE 1.50*  --   --  1.39* 1.53*  CALCIUM 9.6  --   --  9.6 9.7  GLUCOSE 110*  --   --  133* 91   < > = values in this interval not displayed.    Imaging/Diagnostic Tests: None new   Dorris Singh, MD  [facility] Medicine Teaching Service

## 2020-08-28 NOTE — Discharge Summary (Addendum)
[facility] Discharge Summary  Patient name: [name] Medical record number: [id number] Date of birth: 14-Sep-1956 Age: 64 y.o. Gender: male Date of Admission: 08/25/2020  Date of Discharge: 08/28/20 Admitting Physician: Leeanne Rio, MD  Primary Care Provider: Danna Hefty, DO Consultants: None  Indication for Hospitalization: COPD exacerbation  Discharge Diagnoses/Problem List:  COPD exacerbation Hypertension  Tobacco abuse  CKD Stage 3   Disposition: Home  Discharge Condition: Stable  Discharge Exam:   General: alert, breathing comfortable on RA, NAD Cardiovascular: RRR no murmurs Respiratory: course breath sounds bilaterally. Normal WOB  Abdomen: flat, non tender to palpation Extremities: warm, dry. No edema. Distal pulses 2+ bilaterally   Brief Hospital Course:   [name] is a 64 y.o. male presenting with increased shortness of breath. PMH is significant for COPD, asthma, hypertension, tobacco use.  Severe COPD exacerbation  Acute on Chronic Hypoxic Respiratory Failure Patient was brought to the hospital via EMS who gave him 2 g of magnesium, 10 albuterol, 1 Atrovent, 125 of Solu-Medrol.  He was placed on nonrebreather and O2 sat increased to 88% but patient was still tripoding and and distressed. He was subsequently placed on BiPAP in the emergency department and rapidly improved.  Chest x-ray showed hyperinflated lungs but no focal consolidation. He transitioned from 125 IV solumedrol and transitioned to po prednisone 40mg  daily. He had DuoNebs and albuterol as needed. He was given Doxycycline 100mg  twice daily for 5 days, as well as Incruse Ellipta and Breo Ellipta 1 puff daily. He received PT and OT while in the hospital. By time of discharge he remained hemodynamically stable, and his breathing had improved and he was breathing comfortably on room air.   Home medications were continued for other chronic diseases  which remained stable.    Issues for Follow Up:  1. Please be sure to follow-up with our clinic on Monday 10/11 at 11:25pm. 2. Follow up with Pulmonology- a referral was placed and they should call you soon to schedule an appointment 3. He was discharged with steroid taper. He completed doxycycline prior to discharge.  4. If BP elevated consider chlorthalidone or spironolactone if BP still elevated.    Significant Procedures: None  Significant Labs and Imaging:  Recent Labs  Lab 08/25/20 1006 08/25/20 1006 08/25/20 1106 08/26/20 0113 08/28/20 0025  WBC 11.5*  --   --  5.7 10.5  HGB 14.6   < > 15.0 14.1 14.1  HCT 45.9   < > 44.0 42.9 43.1  PLT 211  --   --  224 257   < > = values in this interval not displayed.   Recent Labs  Lab 08/25/20 1006 08/25/20 1006 08/25/20 1106 08/25/20 1106 08/26/20 0113 08/28/20 0025  NA 140  --  139  --  137 138  K 3.7   < > 3.7   < > 4.7 4.1  CL 106  --   --   --  105 103  CO2 22  --   --   --  22 25  GLUCOSE 110*  --   --   --  133* 91  BUN 19  --   --   --  20 33*  CREATININE 1.50*  --   --   --  1.39* 1.53*  CALCIUM 9.6  --   --   --  9.6 9.7   < > = values in this interval not displayed.    Results/Tests Pending  at Time of Discharge: None  Discharge Medications:  Allergies as of 08/28/2020      Reactions   Lisinopril Swelling   Spiriva Respimat [tiotropium Bromide Monohydrate] Other (See Comments)   Red, glassy eyes      Medication List    TAKE these medications   acetaminophen 500 MG tablet Commonly known as: TYLENOL Take 1,000 mg by mouth as needed for mild pain.   albuterol 108 (90 Base) MCG/ACT inhaler Commonly known as: VENTOLIN HFA INHALE 2 PUFFS EVERY 6 HOURS AS NEEDED FOR WHEEZING OR SHORTNESS OF BREATH What changed:   how much to take  how to take this  when to take this  reasons to take this   amLODipine 10 MG tablet Commonly known as: NORVASC Take 1 tablet (10 mg total) by mouth daily.    ipratropium-albuterol 0.5-2.5 (3) MG/3ML Soln Commonly known as: DUONEB INHALE THE CONTENTS OF 1 VIAL VIA NEBULIZER EVERY 6 HOURS AS NEEDED FOR SHORTNESS OF BREATH / WHEEZING What changed:   how much to take  how to take this  when to take this  reasons to take this   predniSONE 10 MG tablet Commonly known as: DELTASONE Take 4 tablets (40 mg total) by mouth daily for 2 days, THEN 3 tablets (30 mg total) daily for 2 days, THEN 2 tablets (20 mg total) daily for 2 days, THEN 1 tablet (10 mg total) daily for 2 days. Start taking on: August 29, 2020   Trelegy Ellipta 100-62.5-25 MCG/INH Aepb Generic drug: Fluticasone-Umeclidin-Vilant Inhale 1 application into the lungs daily. What changed: how much to take       Discharge Instructions: Please refer to Patient Instructions section of EMR for full details.  Patient was counseled important signs and symptoms that should prompt return to medical care, changes in medications, dietary instructions, activity restrictions, and follow up appointments.   Follow-Up Appointments:  Follow-up Information    Mullis, Kiersten P, DO. Go in 3 day(s).   Specialty: Family Medicine Why: Appt scheduled for 10/11 at 11:25am. Please arrive 15 minutes early Contact information: [address] [place] [address] [place], [place], DO 08/28/2020, 7:05 PM PGY-1, [place], DO PGY-2, [facility] 08/30/2020 5:46 PM

## 2020-08-28 NOTE — Progress Notes (Signed)
SATURATION QUALIFICATIONS: (This note is used to comply with regulatory documentation for home oxygen)  Patient Saturations on Room Air at Rest = 100%  Patient Saturations on Room Air while Ambulating = 91-95%  Patient Saturations on 0 Liters of oxygen while Ambulating = 91-95%  Please briefly explain why patient needs home oxygen: Patient able to maintain o2 saturations greater than 88% while ambulating on room air. No supplemental oxygen needed at this time.

## 2020-08-28 NOTE — Progress Notes (Signed)
[name] to be D/C'd per MD order. Discussed with the patient and all questions fully answered. ? VSS, Skin clean, dry and intact without evidence of skin break down, no evidence of skin tears noted. ? IV catheter discontinued intact. Site without signs and symptoms of complications. Dressing and pressure applied. ? An After Visit Summary was printed and given to the patient. Patient informed where to pickup prescriptions. ? D/c education completed with patient/family including follow up instructions, medication list, d/c activities limitations if indicated, with other d/c instructions as indicated by MD - patient able to verbalize understanding, all questions fully answered.  ? Patient instructed to return to ED, call 911, or call MD for any changes in condition.  ? Patient to be escorted via [place], and D/C home via private auto.

## 2020-08-28 NOTE — Discharge Instructions (Signed)
You were hospitalized at [facility] due to a COPD exacerbation that was most likely triggered from a viral infection.  We are so glad you are feeling better.  A referral was placed to see pulmonology outpatient. Please also be sure to follow-up with our clinic on Monday 10/11 at 11:25pm.  Please continue your prednisone: 4 10mg  tablets for 2 days, 3 tablets for 2 days, 2 tablets for 2 days, and 1 tablet for 2 days.  Continue your home medications as prescribed.   Thank you for allowing [place] to take care of you.  Take care, [facility] family medicine team

## 2020-08-31 ENCOUNTER — Encounter: Payer: Self-pay | Admitting: Family Medicine

## 2020-09-28 ENCOUNTER — Inpatient Hospital Stay: Payer: Medicare HMO

## 2020-09-30 ENCOUNTER — Other Ambulatory Visit: Payer: Self-pay

## 2020-09-30 ENCOUNTER — Ambulatory Visit (INDEPENDENT_AMBULATORY_CARE_PROVIDER_SITE_OTHER): Payer: Medicare HMO | Admitting: Family Medicine

## 2020-09-30 VITALS — BP 138/72 | HR 87 | Wt 133.0 lb

## 2020-09-30 DIAGNOSIS — I10 Essential (primary) hypertension: Secondary | ICD-10-CM

## 2020-09-30 DIAGNOSIS — Z23 Encounter for immunization: Secondary | ICD-10-CM | POA: Diagnosis not present

## 2020-09-30 DIAGNOSIS — F17200 Nicotine dependence, unspecified, uncomplicated: Secondary | ICD-10-CM | POA: Diagnosis not present

## 2020-09-30 DIAGNOSIS — R911 Solitary pulmonary nodule: Secondary | ICD-10-CM | POA: Diagnosis not present

## 2020-09-30 DIAGNOSIS — J449 Chronic obstructive pulmonary disease, unspecified: Secondary | ICD-10-CM | POA: Diagnosis not present

## 2020-09-30 DIAGNOSIS — IMO0001 Reserved for inherently not codable concepts without codable children: Secondary | ICD-10-CM

## 2020-09-30 MED ORDER — ALBUTEROL SULFATE HFA 108 (90 BASE) MCG/ACT IN AERS: INHALATION_SPRAY | RESPIRATORY_TRACT | 1 refills | Status: DC

## 2020-09-30 MED ORDER — TRELEGY ELLIPTA 100-62.5-25 MCG/INH IN AEPB: 1.0000 "application " | INHALATION_SPRAY | Freq: Every day | RESPIRATORY_TRACT | 3 refills | Status: DC

## 2020-09-30 MED ORDER — IPRATROPIUM-ALBUTEROL 0.5-2.5 (3) MG/3ML IN SOLN: RESPIRATORY_TRACT | 2 refills | Status: DC

## 2020-09-30 NOTE — Progress Notes (Signed)
Subjective:   Patient ID: [name]    DOB: 1956/10/08, 64 y.o. male   MRN: [id number]  GARED GILLIE is a 64 y.o. male with a history of hypertension, COPD, ED, history of colonic polyps, lung nodule less than 6 cm on CT, tobacco use disorder here for hospital follow-up  Hospital Follow up: Patient was admitted on 08/25/2020 for COPD exacerbation and discharged on 08/28/2020.  He received 2 g of magnesium, 10 albuterol, 1 Atrovent, and Solu-Medrol.  Required BiPAP initially but quickly improved.  Chest x-ray showed hyperinflated lungs, no focal consolidation.  He was treated with doxycycline and prednisone for 5 days, as well as Incruse and Breo Ellipta.  Pulmonology referral was placed at discharge. Today he notes he has been doing great.  Has been compliant with his Trelegy.  Denies any shortness of breath or wheezing.  Notes that his COPD exacerbation was caused by a recent cold.  He notes that he waited too long for get medical attention which led his hospitalization.  He notes he has not heard from pulmonology.  CT chest performed and notable for resolution of prior lung nodule however new 1.0 cm nodular focus in the right lung apex is indeterminate and may represent nodular scarring. Consider short-term follow-up CT in 3 months versus PET-CT. He will need this repeat Chest CT in January 1st.  HTN:  BP: 138/72 today. Currently on Amlodipine 10mg  QD. Endorses compliance. Former smoker. Quit in October during hospitalizaton. Denies any chest pain, SOB, vision changes, or headaches.   Review of Systems:  Per HPI.   Objective:   BP 138/72   Pulse 87   Wt 133 lb (60.3 kg)   SpO2 96%   BMI 20.83 kg/m  Vitals and nursing note reviewed.  General: pleasant thin older male, sitting comfortably in exam chair, well nourished, well developed, in no acute distress with non-toxic appearance Lungs: clear to auscultation bilaterally with normal work of breathing on room air, speaking in full  sentences Extremities: warm and well perfused, normal tone BZJ:IRCV normal Neuro: Alert and oriented, speech normal  Assessment & Plan:   COPD (chronic obstructive pulmonary disease) (HCC) Chronic.  Follow-up for COPD exacerbation in October 2021.  Patient is doing much better.  Compliant with Trelegy.  Refills provided for inhalers and nebulizers.  Lungs clear on exam today.  Encourage patient to call if develops any signs of worsening shortness of breath in order to avoid hospitalization in the future.  He voiced understanding agreement plan. -Patient to call [place] pulmonology to schedule appointment at earliest convenience  Primary hypertension Chronic, slightly above goal.  Currently on amlodipine 10 mg daily.  Former smoker (quit in October 2021). -Continue amlodipine 10 mg daily -Can consider addition of chlorthalidone or spironolactone if remains elevated at follow-up visit  Lung nodule < 6cm on CT Patient informed of CT results. Repeat CT scan ordered and to be scheduled.  Tobacco use disorder Former smoker.  Quit smoking in October 2021.  Congratulated patient on success.  Declined nicotine replacement today.  We will continue to assess cessation at follow-up visit.  Health Maintenance: COVID booster given today Due for TDAP. Will discuss at follow up visit  Orders Placed This Encounter  Procedures  . CT CHEST NODULE FOLLOW UP LOW DOSE W/O    Standing Status:   Future    Standing Expiration Date:   09/30/2021    Order Specific Question:   Preferred imaging location?    Answer:  [place] SARS-COV-2 Vaccine   Meds ordered this encounter  Medications  . Fluticasone-Umeclidin-Vilant (TRELEGY ELLIPTA) 100-62.5-25 MCG/INH AEPB    Sig: Inhale 1 application into the lungs daily.    Dispense:  3 each    Refill:  3  . ipratropium-albuterol (DUONEB) 0.5-2.5 (3) MG/3ML SOLN    Sig: INHALE THE CONTENTS OF 1 VIAL VIA NEBULIZER EVERY 6 HOURS AS NEEDED FOR  SHORTNESS OF BREATH / WHEEZING    Dispense:  90 mL    Refill:  2  . albuterol (VENTOLIN HFA) 108 (90 Base) MCG/ACT inhaler    Sig: INHALE 2 PUFFS EVERY 6 HOURS AS NEEDED FOR WHEEZING OR SHORTNESS OF BREATH    Dispense:  18 g    Refill:  1   Follow up in 3 months for chronic care management and hypertension follow-up  Mina Marble, DO PGY-3, [place] [facility] 09/30/2020 12:11 PM

## 2020-09-30 NOTE — Assessment & Plan Note (Signed)
Patient informed of CT results. Repeat CT scan ordered and to be scheduled.

## 2020-09-30 NOTE — Assessment & Plan Note (Signed)
Chronic, slightly above goal.  Currently on amlodipine 10 mg daily.  Former smoker (quit in October 2021). -Continue amlodipine 10 mg daily -Can consider addition of chlorthalidone or spironolactone if remains elevated at follow-up visit

## 2020-09-30 NOTE — Assessment & Plan Note (Addendum)
Chronic.  Follow-up for COPD exacerbation in October 2021.  Patient is doing much better.  Compliant with Trelegy.  Refills provided for inhalers and nebulizers.  Lungs clear on exam today.  Encourage patient to call if develops any signs of worsening shortness of breath in order to avoid hospitalization in the future.  He voiced understanding agreement plan. -Patient to call [place] pulmonology to schedule appointment at earliest convenience

## 2020-09-30 NOTE — Assessment & Plan Note (Signed)
Former smoker.  Quit smoking in October 2021.  Congratulated patient on success.  Declined nicotine replacement today.  We will continue to assess cessation at follow-up visit.

## 2020-09-30 NOTE — Patient Instructions (Signed)
It was a pleasure to see you today!  Thank you for choosing [facility] for your primary care.  [name] was seen for COPD follow up  Our plans for today were:    I have refilled your Trilogy, ALbuteorl, and nebulizer.  I am so proud of you that you quit smoking  Call [facility] at [facility] at [phone]   You should return to our clinic in 3 months for check up.   Best Wishes,   Mina Marble, DO

## 2020-10-12 ENCOUNTER — Telehealth: Payer: Self-pay

## 2020-10-12 ENCOUNTER — Other Ambulatory Visit: Payer: Self-pay | Admitting: Family Medicine

## 2020-10-12 DIAGNOSIS — J441 Chronic obstructive pulmonary disease with (acute) exacerbation: Secondary | ICD-10-CM

## 2020-10-12 MED ORDER — PREDNISONE 20 MG PO TABS: 40.0000 mg | ORAL_TABLET | Freq: Every day | ORAL | 0 refills | Status: AC

## 2020-10-12 MED ORDER — AMOXICILLIN-POT CLAVULANATE 875-125 MG PO TABS: 1.0000 | ORAL_TABLET | Freq: Two times a day (BID) | ORAL | 0 refills | Status: AC

## 2020-10-12 NOTE — Telephone Encounter (Signed)
I am covering for Dr. Tarry Kos.  Per her last note, looks like she did want early treatment given recent hospitalization.  Rx sent for pred 40mg  x5 days and 5 days of augmentin.  Please let the patient know and that if not that does improve symptoms in 48 hrs, he needs to be seen.

## 2020-10-12 NOTE — Telephone Encounter (Signed)
Patient calls nurse line stating he saw PCP on 11/10 and was told to call if he needed medications for COPD. Patient is requesting Prednisone and an antibiotic to be called in. Advised patient he may need an apt. Patient adamant that PCP told him he did not have to come in for this at 11/10 visit. Will forward to PCP.

## 2020-10-12 NOTE — Progress Notes (Signed)
Patient calls in with symptoms of COPD exacerbation.  Abx indicated given recent hospitalization.

## 2020-10-13 NOTE — Telephone Encounter (Signed)
Late entry** Patient was informed yesterday and advised to schedule an apt if no improvement with medications.

## 2020-11-10 ENCOUNTER — Other Ambulatory Visit: Payer: Self-pay

## 2020-11-10 ENCOUNTER — Ambulatory Visit (INDEPENDENT_AMBULATORY_CARE_PROVIDER_SITE_OTHER): Payer: Medicare HMO | Admitting: Family Medicine

## 2020-11-10 ENCOUNTER — Encounter: Payer: Self-pay | Admitting: Family Medicine

## 2020-11-10 VITALS — BP 152/80 | HR 92 | Wt 134.0 lb

## 2020-11-10 DIAGNOSIS — J441 Chronic obstructive pulmonary disease with (acute) exacerbation: Secondary | ICD-10-CM

## 2020-11-10 DIAGNOSIS — N1831 Chronic kidney disease, stage 3a: Secondary | ICD-10-CM

## 2020-11-10 MED ORDER — PREDNISONE 20 MG PO TABS: 40.0000 mg | ORAL_TABLET | Freq: Every day | ORAL | 0 refills | Status: DC

## 2020-11-10 MED ORDER — TETANUS-DIPHTH-ACELL PERTUSSIS 5-2.5-18.5 LF-MCG/0.5 IM SUSP: 0.5000 mL | Freq: Once | INTRAMUSCULAR | 0 refills | Status: AC

## 2020-11-10 MED ORDER — DOXYCYCLINE HYCLATE 100 MG PO TABS: 100.0000 mg | ORAL_TABLET | Freq: Two times a day (BID) | ORAL | 0 refills | Status: DC

## 2020-11-10 NOTE — Assessment & Plan Note (Signed)
Given increased sputum production and worsening breathing, likely symptoms due to COPD exacerbation.  Patient has long hisotry of COPD with multiple exacerbations.  Praised patient on continued smoking cessation.   - Prescribe 5 day course of Prednisone 40 mg daily and Doxycycline 100 mg BID - Appt with pulmonology scheduled in January

## 2020-11-10 NOTE — Patient Instructions (Signed)
It was good to see you today.  Thank you for coming in.  I think you have COPD exacerbation.  I am prescribing you Prednisone 40 mg (2 tablets) with breakfeast and Doxycycline 100 mg twice a day for 5 day  You should be better in 1 week.  If you are not better by then or if you have worsening breathing, please go to the hospital.  Thanks, Dr Manus Rudd

## 2020-11-10 NOTE — Progress Notes (Signed)
    SUBJECTIVE:   CHIEF COMPLAINT / HPI: COPD Exacerbation  [name] is presenting due to increased work of breathing and increased sputum production.  Was feeling very short of breath when working up this morning.  Indicates he used Nebulizer he has at home with some improvement.  Typically uses everyday and uses rescue Albuterol as needed.  Gets exacerbations fairly frequently and indicates they resolve with course of Prednisone and Antibiotics.  Patient quit smoking 2 months ago.  PERTINENT  PMH / PSH: COPD, CKD  OBJECTIVE:   BP (!) 152/80   Pulse 92   Wt 134 lb (60.8 kg)   SpO2 96%   BMI 20.99 kg/m    Physical Exam Constitutional:      General: He is not in acute distress.    Appearance: Normal appearance. He is not ill-appearing.  HENT:     Head: Normocephalic and atraumatic.     Mouth/Throat:     Mouth: Mucous membranes are moist.  Cardiovascular:     Rate and Rhythm: Normal rate and regular rhythm.     Pulses: Normal pulses.  Pulmonary:     Effort: Pulmonary effort is normal. No respiratory distress.     Breath sounds: Wheezing present.     Comments: Moderate wheezing heard throughout lungs Skin:    General: Skin is warm.  Neurological:     Mental Status: He is alert.     ASSESSMENT/PLAN:   COPD (chronic obstructive pulmonary disease) (HCC) Given increased sputum production and worsening breathing, likely symptoms due to COPD exacerbation.  Patient has long hisotry of COPD with multiple exacerbations.  Praised patient on continued smoking cessation.   - Prescribe 5 day course of Prednisone 40 mg daily and Doxycycline 100 mg BID - Appt with pulmonology scheduled in January    CKD - Obtain BMP  Delora Fuel, MD [place]

## 2020-11-11 LAB — BASIC METABOLIC PANEL
BUN/Creatinine Ratio: 14 (ref 10–24)
BUN: 18 mg/dL (ref 8–27)
CO2: 17 mmol/L — ABNORMAL LOW (ref 20–29)
Calcium: 9.4 mg/dL (ref 8.6–10.2)
Chloride: 107 mmol/L — ABNORMAL HIGH (ref 96–106)
Creatinine, Ser: 1.33 mg/dL — ABNORMAL HIGH (ref 0.76–1.27)
GFR calc Af Amer: 64 mL/min/{1.73_m2} (ref >59)
GFR calc non Af Amer: 55 mL/min/{1.73_m2} — ABNORMAL LOW (ref >59)
Glucose: 86 mg/dL (ref 65–99)
Potassium: 4 mmol/L (ref 3.5–5.2)
Sodium: 140 mmol/L (ref 134–144)

## 2020-11-21 DIAGNOSIS — J449 Chronic obstructive pulmonary disease, unspecified: Secondary | ICD-10-CM | POA: Diagnosis not present

## 2020-11-24 ENCOUNTER — Ambulatory Visit (HOSPITAL_COMMUNITY)
Admission: RE | Admit: 2020-11-24 | Discharge: 2020-11-24 | Disposition: A | Discharge status: Home / Self Care | Payer: Medicare Other | Source: Ambulatory Visit | Attending: Family Medicine | Admitting: Family Medicine

## 2020-11-24 ENCOUNTER — Other Ambulatory Visit: Payer: Self-pay

## 2020-11-24 DIAGNOSIS — K769 Liver disease, unspecified: Secondary | ICD-10-CM | POA: Insufficient documentation

## 2020-11-24 DIAGNOSIS — R911 Solitary pulmonary nodule: Secondary | ICD-10-CM | POA: Diagnosis not present

## 2020-11-24 DIAGNOSIS — J439 Emphysema, unspecified: Secondary | ICD-10-CM | POA: Diagnosis not present

## 2020-11-24 DIAGNOSIS — I700 Atherosclerosis of aorta: Secondary | ICD-10-CM | POA: Diagnosis not present

## 2020-11-24 DIAGNOSIS — Z122 Encounter for screening for malignant neoplasm of respiratory organs: Secondary | ICD-10-CM | POA: Diagnosis not present

## 2020-11-24 DIAGNOSIS — IMO0001 Reserved for inherently not codable concepts without codable children: Secondary | ICD-10-CM

## 2020-11-25 ENCOUNTER — Institutional Professional Consult (permissible substitution): Payer: Medicare HMO | Admitting: Pulmonary Disease

## 2020-11-25 NOTE — Progress Notes (Incomplete)
Synopsis: Referred in 11/2020 for COPD by Dorris Singh, MD  Subjective:   PATIENT ID: [name] GENDER: male DOB: Aug 14, 1956, MRN: [id number]   HPI  No chief complaint on file.   [name] is a 65 year old male,   He was recently admitted 08/25/20 to 08/28/20 for COPD exacerbation and acute on chronic hypoxemic respiratory failure. He required bipap initially in the ED with rapid improvement. He was discharged on Trelegy Ellipta.   Past Medical History:  Diagnosis Date  . Asthma   . COPD (chronic obstructive pulmonary disease) ([place])   . Hypertension   . Shortness of breath      Family History  Problem Relation Age of Onset  . Cancer Father      Social History   Socioeconomic History  . Marital status: Married    Spouse name: Not on file  . Number of children: Not on file  . Years of education: Not on file  . Highest education level: Not on file  Occupational History  . Not on file  Tobacco Use  . Smoking status: Former Smoker    Packs/day: 0.50    Years: 30.00    Pack years: 15.00    Types: Cigarettes    Quit date: 08/25/2020    Years since quitting: 0.2  . Smokeless tobacco: Never Used  Vaping Use  . Vaping Use: Never used  Substance and Sexual Activity  . Alcohol use: Yes    Comment: rare/occasional  . Drug use: Not Currently  . Sexual activity: Yes    Partners: Female  Other Topics Concern  . Not on file  Social History Narrative  . Not on file   Social Determinants of Health   Financial Resource Strain: Not on file  Food Insecurity: Not on file  Transportation Needs: Not on file  Physical Activity: Not on file  Stress: Not on file  Social Connections: Not on file  Intimate Partner Violence: Not on file     Allergies  Allergen Reactions  . Lisinopril Swelling  . Spiriva Respimat [Tiotropium Bromide Monohydrate] Other (See Comments)    Red, glassy eyes     Outpatient Medications Prior to Visit  Medication Sig Dispense Refill   . acetaminophen (TYLENOL) 500 MG tablet Take 1,000 mg by mouth as needed for mild pain.    Marland Kitchen albuterol (VENTOLIN HFA) 108 (90 Base) MCG/ACT inhaler INHALE 2 PUFFS EVERY 6 HOURS AS NEEDED FOR WHEEZING OR SHORTNESS OF BREATH 18 g 1  . amLODipine (NORVASC) 10 MG tablet Take 1 tablet (10 mg total) by mouth daily. 90 tablet 3  . doxycycline (VIBRA-TABS) 100 MG tablet Take 1 tablet (100 mg total) by mouth 2 (two) times daily. 10 tablet 0  . Fluticasone-Umeclidin-Vilant (TRELEGY ELLIPTA) 100-62.5-25 MCG/INH AEPB Inhale 1 application into the lungs daily. 3 each 3  . ipratropium-albuterol (DUONEB) 0.5-2.5 (3) MG/3ML SOLN INHALE THE CONTENTS OF 1 VIAL VIA NEBULIZER EVERY 6 HOURS AS NEEDED FOR SHORTNESS OF BREATH / WHEEZING 90 mL 2  . predniSONE (DELTASONE) 20 MG tablet Take 2 tablets (40 mg total) by mouth daily with breakfast. 10 tablet 0   No facility-administered medications prior to visit.    ROS    Objective:  There were no vitals filed for this visit.   Physical Exam    CBC    Component Value Date/Time   WBC 10.5 08/28/2020 0025   RBC 4.29 08/28/2020 0025   HGB 14.1 08/28/2020 0025   HCT 43.1  08/28/2020 0025   PLT 257 08/28/2020 0025   MCV 100.5 (H) 08/28/2020 0025   MCH 32.9 08/28/2020 0025   MCHC 32.7 08/28/2020 0025   RDW 12.0 08/28/2020 0025   LYMPHSABS 3.7 08/25/2020 1006   MONOABS 1.5 (H) 08/25/2020 1006   EOSABS 0.3 08/25/2020 1006   BASOSABS 0.0 08/25/2020 1006   BMP Latest Ref Rng & Units 11/10/2020 08/28/2020 08/26/2020  Glucose 65 - 99 mg/dL 86 91 169(C)  BUN 8 - 27 mg/dL 18 78(L) 20  Creatinine 0.76 - 1.27 mg/dL 3.81(O) 1.75(Z) 0.25(E)  BUN/Creat Ratio 10 - 24 14 - -  Sodium 134 - 144 mmol/L 140 138 137  Potassium 3.5 - 5.2 mmol/L 4.0 4.1 4.7  Chloride 96 - 106 mmol/L 107(H) 103 105  CO2 20 - 29 mmol/L 17(L) 25 22  Calcium 8.6 - 10.2 mg/dL 9.4 9.7 9.6   Chest imaging: CT Chest low dose 11/24/2020 1. Stable 10 mm nodular opacity in the medial right lung apex  with adjacent subpleural changes. This is likely scarring and the 3 months of imaging stability is reassuring for benign etiology. Continued surveillance recommended with follow-up CT chest in 6-12 months. 2. Stable low-density liver lesions, most likely benign. 3. Aortic Atherosclerosis (ICD10-I70.0) and Emphysema (ICD10-J43.9).  CT Chest 08/21/20 1. Moderate centrilobular and paraseptal emphysema. 2. 1.0 cm nodular focus in the right lung apex is indeterminate and may represent nodular scarring. Consider short-term follow-up CT in 3 months versus PET-CT. 3. No nodule identified in the left lung apex (the previous CT where this nodule was identified is not available for comparison). There is a 2 mm nodule in the right lower lobe.  PFT: No flowsheet data found.  Echo: 2005 Overall left ventricular systolic function was normal. Left     ventricular ejection fraction was estimated , range being 55     % to 65 %.. There were no left ventricular regional wall     motion abnormalities.     Assessment & Plan:   No diagnosis found.  Discussion: ***    Current Outpatient Medications:  .  acetaminophen (TYLENOL) 500 MG tablet, Take 1,000 mg by mouth as needed for mild pain., Disp: , Rfl:  .  albuterol (VENTOLIN HFA) 108 (90 Base) MCG/ACT inhaler, INHALE 2 PUFFS EVERY 6 HOURS AS NEEDED FOR WHEEZING OR SHORTNESS OF BREATH, Disp: 18 g, Rfl: 1 .  amLODipine (NORVASC) 10 MG tablet, Take 1 tablet (10 mg total) by mouth daily., Disp: 90 tablet, Rfl: 3 .  doxycycline (VIBRA-TABS) 100 MG tablet, Take 1 tablet (100 mg total) by mouth 2 (two) times daily., Disp: 10 tablet, Rfl: 0 .  Fluticasone-Umeclidin-Vilant (TRELEGY ELLIPTA) 100-62.5-25 MCG/INH AEPB, Inhale 1 application into the lungs daily., Disp: 3 each, Rfl: 3 .  ipratropium-albuterol (DUONEB) 0.5-2.5 (3) MG/3ML SOLN, INHALE THE CONTENTS OF 1 VIAL VIA NEBULIZER EVERY 6 HOURS AS NEEDED FOR SHORTNESS OF BREATH / WHEEZING, Disp:  90 mL, Rfl: 2 .  predniSONE (DELTASONE) 20 MG tablet, Take 2 tablets (40 mg total) by mouth daily with breakfast., Disp: 10 tablet, Rfl: 0

## 2020-12-13 ENCOUNTER — Other Ambulatory Visit: Payer: Self-pay | Admitting: Family Medicine

## 2020-12-13 DIAGNOSIS — I10 Essential (primary) hypertension: Secondary | ICD-10-CM

## 2020-12-22 ENCOUNTER — Encounter: Payer: Self-pay | Admitting: Family Medicine

## 2020-12-22 ENCOUNTER — Other Ambulatory Visit: Payer: Self-pay

## 2020-12-22 ENCOUNTER — Ambulatory Visit (INDEPENDENT_AMBULATORY_CARE_PROVIDER_SITE_OTHER): Payer: Medicare Other | Admitting: Family Medicine

## 2020-12-22 VITALS — BP 158/88 | HR 86 | Ht 69.0 in | Wt 138.2 lb

## 2020-12-22 DIAGNOSIS — I10 Essential (primary) hypertension: Secondary | ICD-10-CM | POA: Diagnosis not present

## 2020-12-22 DIAGNOSIS — J441 Chronic obstructive pulmonary disease with (acute) exacerbation: Secondary | ICD-10-CM | POA: Insufficient documentation

## 2020-12-22 MED ORDER — PREDNISONE 20 MG PO TABS: 40.0000 mg | ORAL_TABLET | Freq: Every day | ORAL | 0 refills | Status: AC

## 2020-12-22 NOTE — Assessment & Plan Note (Signed)
Acute. Symptoms appear most consistent with mild acute COPD exacerbation. Hemodynamically stable with normal O2 sats on room air. Breathing comfortably on room air and speaking in full sentences. Do not feel antibiotics are indicated at this time.  - Prednisone 40mg  QD x 7 days - continue Trelegy daily - Duonebs every 4 hours x 48 hours then PRN - call clinic if no improvement or worsening, will call in antibiotic   Given history of >3 exacerbation in last year Antibiotic options include:  Amoxicillin-clavulanate 875-125 mg PO BID OR Doxycycline 100 mg PO BID x 5 days

## 2020-12-22 NOTE — Assessment & Plan Note (Signed)
Chronic. Multiple elevated blood pressures over last few visits. Current medications include Amlodipine 10mg  QD. - Follow up scheduled for blood pressure management. Likely benefit from additional agent (chlorthalidone or spironolactone). History of swelling with ACE-I.

## 2020-12-22 NOTE — Patient Instructions (Signed)
It was a pleasure to see you today!  Thank you for choosing [facility] for your primary care.   Our plans for today were:  Start Prednisone 40mg  daily x 7 days  Continue Trelegy daily  Do Duonebs every 4 hours x 48 hours.   If no improvement in 48 hours, call the clinic so we can discuss possible need for antibiotics. Call sooner if worsening.  Best Wishes,   Mina Marble, DO

## 2020-12-22 NOTE — Progress Notes (Signed)
   Subjective:   Patient ID: [name]    DOB: [birth year], 65 y.o. male   MRN: [id number]  [name] is a 65 y.o. male with a history ofHTN, COPD, ED, lung nodule <6cm, tobacco abuse disorder, h/o colonic polyps here for "COPD flare"  "COPD Flare" Patient notes that he developed a runny nose, nonproductive cough, and worsening shortness of breath x 4 days ago. She endorses difficulty walking from the bedroom to the bathroom due to shortness of breath. He has been having to use the Duonebs throughout the day (about 4x/day). He notes that he gets to the point of "panting for air" > uses the duonebs > symptoms improve for a while. He has been compliant with his Trelegy inhaler. Notes that he took a friends Prednisone 40mg  x 1 yesterday and feels somewhat improved today.   Review of Systems:  Per HPI.   Objective:   BP (!) 158/88   Pulse 86   Ht 5\' 9"  (1.753 m)   Wt 138 lb 3.2 oz (62.7 kg)   SpO2 98%   BMI 20.41 kg/m  Vitals and nursing note reviewed.  General: pleasant thin older male, sitting comfortably in exam chair, well nourished, well developed, in no acute distress with non-toxic appearance CV: regular rate and rhythm without murmurs, rubs, or gallops, no lower extremity edema Lungs: decreased air movement throughout but L>R, no wheezing appreciated, normal work of breathing on room air, speaking in full sentences Skin: warm, dry MSK:  gait normal Neuro: Alert and oriented, speech normal  Assessment & Plan:   COPD with acute exacerbation ([place]) Acute. Symptoms appear most consistent with mild acute COPD exacerbation. Hemodynamically stable with normal O2 sats on room air. Breathing comfortably on room air and speaking in full sentences. Do not feel antibiotics are indicated at this time.  - Prednisone 40mg  QD x 7 days - continue Trelegy daily - Duonebs every 4 hours x 48 hours then PRN - call clinic if no improvement or worsening, will call in antibiotic   Given  history of >3 exacerbation in last year Antibiotic options include:  Amoxicillin-clavulanate 875-125 mg PO BID OR Doxycycline 100 mg PO BID x 5 days  Primary hypertension Chronic. Multiple elevated blood pressures over last few visits. Current medications include Amlodipine 10mg  QD. - Follow up scheduled for blood pressure management. Likely benefit from additional agent (chlorthalidone or spironolactone). History of swelling with ACE-I.  No orders of the defined types were placed in this encounter.  Meds ordered this encounter  Medications  . predniSONE (DELTASONE) 20 MG tablet    Sig: Take 2 tablets (40 mg total) by mouth daily with breakfast for 7 days.    Dispense:  14 tablet    Refill:  0    Mina Marble, DO PGY-3, [facility] 12/22/2020 10:01 PM

## 2020-12-23 ENCOUNTER — Telehealth: Payer: Self-pay | Admitting: Family Medicine

## 2020-12-23 NOTE — Telephone Encounter (Signed)
During visit on 12/22/20, provided patient with pulmonology office information for him to call and schedule follow up appointment given number of exacerbations and history of intubation. He missed his most recent appointment in January.   Call patient today to check in. He notes he has not picked up his prednisone but plans to pick it up today. He denies worsening of his symptoms.  Emphasized again importance of scheduling follow up appointment with pulm. He voiced understanding and plans to call them today to schedule.

## 2021-01-11 ENCOUNTER — Other Ambulatory Visit: Payer: Self-pay

## 2021-01-11 ENCOUNTER — Encounter: Payer: Self-pay | Admitting: Family Medicine

## 2021-01-11 ENCOUNTER — Ambulatory Visit (INDEPENDENT_AMBULATORY_CARE_PROVIDER_SITE_OTHER): Payer: Medicare Other | Admitting: Family Medicine

## 2021-01-11 VITALS — BP 160/80 | HR 85 | Wt 135.8 lb

## 2021-01-11 DIAGNOSIS — J441 Chronic obstructive pulmonary disease with (acute) exacerbation: Secondary | ICD-10-CM

## 2021-01-11 DIAGNOSIS — I10 Essential (primary) hypertension: Secondary | ICD-10-CM | POA: Diagnosis not present

## 2021-01-11 MED ORDER — CHLORTHALIDONE 25 MG PO TABS: 25.0000 mg | ORAL_TABLET | Freq: Every day | ORAL | 3 refills | Status: DC

## 2021-01-11 NOTE — Patient Instructions (Signed)
It was a pleasure to see you today!  Thank you for choosing [facility] for your primary care.   Our plans for today were:  For your blood pressure: Continue Amlodipine 10mg  daily. Start Chlorthalidone 25mg  daily.   Monitor blood pressure daily and keep a log. Bring log with you to blood pressure check.  Follow up on 01/27/21 at 3:15pm for repeat labs and BP check    DONT FORGET TO SCHEDULE WITH PULMONOLOGY!!!!!  Best Wishes,   Mina Marble, DO

## 2021-01-11 NOTE — Assessment & Plan Note (Signed)
Resolved

## 2021-01-11 NOTE — Assessment & Plan Note (Signed)
Chronic, uncontrolled. - continue Amlodipine 10mg  QD - start Chlorthalidone 25mg  QD - Recommend to monitor blood pressure at home - Follow-up in 2 weeks for blood pressure check and BMP to monitor kidney function -If still elevated will increase chlorthalidone to 50 mg daily

## 2021-01-11 NOTE — Assessment & Plan Note (Signed)
Chronic.  History of multiple exacerbations, some requiring hospitalization.  Again strongly encouraged patient to schedule follow-up appointment with pulmonology.  Continue inhalers as prescribed.

## 2021-01-11 NOTE — Progress Notes (Signed)
   Subjective:   Patient ID: [name]    DOB: Jan 12, 1956, 65 y.o. male   MRN: [id number]  [name] is a 65 y.o. male with a history of HTN, COPD, ED, h/o colonic polyps, tobacco use disorder here for BP management.  HTN:  BP: (!) 160/80 today. Currently on Amlodipine 10mg  QD. Endorses compliance. Former Smoker. Denies any chest pain, SOB, vision changes, or headaches.  History of swelling with Lisinopril.   Lab Results  Component Value Date   CREATININE 1.33 (H) 11/10/2020   CREATININE 1.53 (H) 08/28/2020   CREATININE 1.39 (H) 08/26/2020    Review of Systems:  Per HPI.   Objective:   BP (!) 160/80   Pulse 85   Wt 135 lb 12.8 oz (61.6 kg)   SpO2 96%   BMI 20.05 kg/m   Repeat BP: 177/90  Vitals and nursing note reviewed.  General: pleasant thin older male, sitting comfortably in exam chair, well nourished, well developed, in no acute distress with non-toxic appearance Resp: breathing comfortably on room air, speaking in full sentences Skin: warm, dry MSK: gait normal Neuro: Alert and oriented, speech normal  Assessment & Plan:   Primary hypertension Chronic, uncontrolled. - continue Amlodipine 10mg  QD - start Chlorthalidone 25mg  QD - Recommend to monitor blood pressure at home - Follow-up in 2 weeks for blood pressure check and BMP to monitor kidney function -If still elevated will increase chlorthalidone to 50 mg daily  COPD with acute exacerbation (HCC) Resolved  COPD (chronic obstructive pulmonary disease) (HCC) Chronic.  History of multiple exacerbations, some requiring hospitalization.  Again strongly encouraged patient to schedule follow-up appointment with pulmonology.  Continue inhalers as prescribed.  Orders Placed This Encounter  Procedures  . Basic Metabolic Panel    Standing Status:   Future    Standing Expiration Date:   01/11/2022   Meds ordered this encounter  Medications  . chlorthalidone (HYGROTON) 25 MG tablet    Sig: Take 1  tablet (25 mg total) by mouth daily.    Dispense:  90 tablet    Refill:  [place], DO PGY-3, [facility] 01/11/2021 4:42 PM

## 2021-01-15 ENCOUNTER — Other Ambulatory Visit: Payer: Self-pay

## 2021-01-15 DIAGNOSIS — J449 Chronic obstructive pulmonary disease, unspecified: Secondary | ICD-10-CM

## 2021-01-15 MED ORDER — IPRATROPIUM-ALBUTEROL 0.5-2.5 (3) MG/3ML IN SOLN: RESPIRATORY_TRACT | 2 refills | Status: DC

## 2021-01-15 MED ORDER — ALBUTEROL SULFATE HFA 108 (90 BASE) MCG/ACT IN AERS: INHALATION_SPRAY | RESPIRATORY_TRACT | 1 refills | Status: DC

## 2021-01-22 ENCOUNTER — Other Ambulatory Visit: Payer: Self-pay | Admitting: Family Medicine

## 2021-01-22 DIAGNOSIS — J449 Chronic obstructive pulmonary disease, unspecified: Secondary | ICD-10-CM

## 2021-01-22 MED ORDER — ALBUTEROL SULFATE HFA 108 (90 BASE) MCG/ACT IN AERS: INHALATION_SPRAY | RESPIRATORY_TRACT | 1 refills | Status: DC

## 2021-01-22 NOTE — Telephone Encounter (Signed)
Received phone call that patient has not received albuterol inhaler. Original rx was set to print. Resent electronically per original instructions.   Talbot Grumbling, RN

## 2021-01-22 NOTE — Addendum Note (Signed)
Addended by: Talbot Grumbling on: 01/22/2021 04:05 PM   Modules accepted: Orders

## 2021-01-27 ENCOUNTER — Other Ambulatory Visit: Payer: Self-pay

## 2021-01-27 ENCOUNTER — Other Ambulatory Visit: Payer: Medicare Other

## 2021-01-27 ENCOUNTER — Ambulatory Visit (INDEPENDENT_AMBULATORY_CARE_PROVIDER_SITE_OTHER): Payer: Medicare Other

## 2021-01-27 VITALS — BP 160/86 | HR 79

## 2021-01-27 DIAGNOSIS — I10 Essential (primary) hypertension: Secondary | ICD-10-CM

## 2021-01-27 DIAGNOSIS — Z0130 Encounter for examination of blood pressure without abnormal findings: Secondary | ICD-10-CM

## 2021-01-27 NOTE — Patient Instructions (Signed)
Please call our office at [phone] once you receive your new BP medication, chlorthalidone. We would like to see you for a nurse visit one week after beginning this medication.   Today your BP was 160/86. Please contact our office if you become symptomatic with your blood pressure. Including headache, blurry vision, chest pain or worsening shortness of breath.   We will draw labs at this follow up visit as well.    Have a nice day,   Talbot Grumbling, RN

## 2021-01-27 NOTE — Progress Notes (Signed)
Patient here today for BP check.      Last BP was on 01/11/2021 and was 160/80.  BP today is 160/86 with a pulse of 85.    Checked BP in right arm with adult regular cuff.    Symptoms present: none.   Patient last took BP med this morning. Patient reports that he has not yet received his chlorthalidone yet from [facility]. Patient states that he spoke with [facility] earlier this week and that medication is being shipped. Advised patient to follow up one week after starting medication for RN visit for BP check and labs, per Dr. Ardelia Mems.     Routed note to PCP.         Talbot Grumbling, RN

## 2021-01-28 ENCOUNTER — Telehealth: Payer: Self-pay

## 2021-01-28 NOTE — Telephone Encounter (Signed)
Patient calls nurse line requesting a steroid prescription. Patient reports there was a mix up with ordering his albuterol inhaler and it will not be here for a few days via mailorder. Patient reports she has been using his nebulizer solution, however he feels its not controlling his breathing all that well. Patient is speaking in full sentences. We have not apts until next week. Strict ED precautions given. Will forward to PCP for steroid prescription. Please send to local pharmacy.

## 2021-01-29 ENCOUNTER — Other Ambulatory Visit: Payer: Self-pay | Admitting: Family Medicine

## 2021-01-29 DIAGNOSIS — J449 Chronic obstructive pulmonary disease, unspecified: Secondary | ICD-10-CM

## 2021-01-29 MED ORDER — PREDNISONE 20 MG PO TABS: 40.0000 mg | ORAL_TABLET | Freq: Every day | ORAL | 0 refills | Status: AC

## 2021-01-29 MED ORDER — ALBUTEROL SULFATE HFA 108 (90 BASE) MCG/ACT IN AERS: INHALATION_SPRAY | RESPIRATORY_TRACT | 1 refills | Status: DC

## 2021-01-29 NOTE — Telephone Encounter (Signed)
Attempted to contact patient multiple times to obtain more information regarding symptoms. No answer and unable to leave VM.  Patient has significant history of COPD exacerbation thus will send in Albuterol inhaler to pharmacy as well as 5 day course of steroids.  He should follow up if no improvement over the weekend as he may need antibiotics. He should report to the ED if no worsening despite prednisone.

## 2021-03-06 ENCOUNTER — Ambulatory Visit (HOSPITAL_COMMUNITY)
Admission: EM | Admit: 2021-03-06 | Discharge: 2021-03-06 | Disposition: A | Discharge status: Home / Self Care | Payer: Medicare Other | Attending: Urgent Care | Admitting: Urgent Care

## 2021-03-06 ENCOUNTER — Encounter (HOSPITAL_COMMUNITY): Payer: Self-pay | Admitting: Emergency Medicine

## 2021-03-06 ENCOUNTER — Ambulatory Visit (INDEPENDENT_AMBULATORY_CARE_PROVIDER_SITE_OTHER): Payer: Medicare Other

## 2021-03-06 ENCOUNTER — Other Ambulatory Visit: Payer: Self-pay

## 2021-03-06 DIAGNOSIS — R059 Cough, unspecified: Secondary | ICD-10-CM | POA: Diagnosis not present

## 2021-03-06 DIAGNOSIS — R0602 Shortness of breath: Secondary | ICD-10-CM

## 2021-03-06 DIAGNOSIS — R062 Wheezing: Secondary | ICD-10-CM

## 2021-03-06 DIAGNOSIS — J449 Chronic obstructive pulmonary disease, unspecified: Secondary | ICD-10-CM | POA: Diagnosis not present

## 2021-03-06 MED ORDER — PREDNISONE 20 MG PO TABS: ORAL_TABLET | ORAL | 0 refills | Status: DC

## 2021-03-06 MED ORDER — PROMETHAZINE-DM 6.25-15 MG/5ML PO SYRP: 5.0000 mL | ORAL_SOLUTION | Freq: Every evening | ORAL | 0 refills | Status: DC | PRN

## 2021-03-06 NOTE — ED Triage Notes (Signed)
Pt is present today with SOB. Pt states that he has a hx of COPD.Pt states that he only noticed the SOB when he is up moving around

## 2021-03-06 NOTE — ED Provider Notes (Signed)
[place]   MRN: [id number] DOB: 1956-01-24  Subjective:   [name] is a 65 y.o. male presenting for 2-day history of acute onset recurrent shortness of breath, chest tightness, cough, wheezing.  Patient has a history of COPD, actually quit smoking on 2 lung ago.  Has been using his inhalers with very temporary relief.  States that he wanted to come in and make sure he got checked in case he needed antibiotics.  Denies chest pain, hemoptysis, body aches, fevers.  Patient does have allergies as well and is taking oral antihistamines that he has at home.  No current facility-administered medications for this encounter.  Current Outpatient Medications:  .  acetaminophen (TYLENOL) 500 MG tablet, Take 1,000 mg by mouth as needed for mild pain., Disp: , Rfl:  .  albuterol (VENTOLIN HFA) 108 (90 Base) MCG/ACT inhaler, INHALE 2 PUFFS EVERY 6 HOURS AS NEEDED FOR WHEEZING OR SHORTNESS OF BREATH, Disp: 18 g, Rfl: 1 .  amLODipine (NORVASC) 10 MG tablet, TAKE 1 TABLET BY MOUTH EVERY DAY, Disp: 90 tablet, Rfl: 3 .  chlorthalidone (HYGROTON) 25 MG tablet, Take 1 tablet (25 mg total) by mouth daily., Disp: 90 tablet, Rfl: 3 .  Fluticasone-Umeclidin-Vilant (TRELEGY ELLIPTA) 100-62.5-25 MCG/INH AEPB, Inhale 1 application into the lungs daily., Disp: 3 each, Rfl: 3 .  ipratropium-albuterol (DUONEB) 0.5-2.5 (3) MG/3ML SOLN, INHALE 1 VIAL VIA NEBULIZER EVERY 6 HOURS AS NEEDED FOR SHORTNESS OF BREATH /  WHEEZING, Disp: 270 mL, Rfl: 11   Allergies  Allergen Reactions  . Lisinopril Swelling  . Spiriva Respimat [Tiotropium Bromide Monohydrate] Other (See Comments)    Red, glassy eyes    Past Medical History:  Diagnosis Date  . Asthma   . COPD (chronic obstructive pulmonary disease) ([place])   . Hypertension   . Shortness of breath      Past Surgical History:  Procedure Laterality Date  . COLONOSCOPY      Family History  Problem Relation Age of Onset  . Cancer Father      Social History   Tobacco Use  . Smoking status: Former Smoker    Packs/day: 0.50    Years: 30.00    Pack years: 15.00    Types: Cigarettes    Quit date: 08/25/2020    Years since quitting: 0.5  . Smokeless tobacco: Never Used  Vaping Use  . Vaping Use: Never used  Substance Use Topics  . Alcohol use: Yes    Comment: rare/occasional  . Drug use: Not Currently    ROS   Objective:   Vitals: BP (!) 157/91 (BP Location: Right Arm)   Pulse 86   Temp 98.4 F (36.9 C) (Temporal)   Resp 17   SpO2 99%   Physical Exam Constitutional:      General: He is not in acute distress.    Appearance: Normal appearance. He is well-developed and normal weight. He is not ill-appearing, toxic-appearing or diaphoretic.  HENT:     Head: Normocephalic and atraumatic.     Right Ear: External ear normal.     Left Ear: External ear normal.     Nose: Nose normal.     Mouth/Throat:     Mouth: Mucous membranes are moist.     Pharynx: Oropharynx is clear.  Eyes:     General: No scleral icterus.       Right eye: No discharge.        Left eye: No discharge.  Extraocular Movements: Extraocular movements intact.     Conjunctiva/sclera: Conjunctivae normal.     Pupils: Pupils are equal, round, and reactive to light.  Cardiovascular:     Rate and Rhythm: Normal rate and regular rhythm.     Heart sounds: Normal heart sounds. No murmur heard. No friction rub. No gallop.   Pulmonary:     Effort: Pulmonary effort is normal. No respiratory distress.     Breath sounds: No stridor. Wheezing (mild over mid to lower lung fields bilaterally) present. No decreased breath sounds, rhonchi or rales.  Neurological:     Mental Status: He is alert and oriented to person, place, and time.  Psychiatric:        Mood and Affect: Mood normal.        Behavior: Behavior normal.        Thought Content: Thought content normal.        Judgment: Judgment normal.     DG Chest 2 View  Result Date:  03/06/2021 CLINICAL DATA:  Cough and shortness of breath.  History of COPD. EXAM: CHEST - 2 VIEW COMPARISON:  August 25, 2020 FINDINGS: The heart size and mediastinal contours are within normal limits. Both lungs are clear. Lungs are hyperinflated. The visualized skeletal structures are unremarkable. IMPRESSION: No active cardiopulmonary disease. COPD. Electronically Signed   By: Abelardo Diesel M.D.   On: 03/06/2021 13:57    Assessment and Plan :   PDMP not reviewed this encounter.  1. Chronic obstructive pulmonary disease, unspecified COPD type ([place])   2. Shortness of breath   3. Wheezing     Suspect mild COPD flare.  Recommended oral prednisone course, supportive care otherwise.  Maintain inhalers.  Patient deferred COVID-19 testing.  Counseled patient on potential for adverse effects with medications prescribed/recommended today, ER and return-to-clinic precautions discussed, patient verbalized understanding.    Jaynee Eagles, PA-C 03/06/21 1515

## 2021-03-27 ENCOUNTER — Emergency Department (HOSPITAL_COMMUNITY): Payer: Medicare Other

## 2021-03-27 ENCOUNTER — Inpatient Hospital Stay (HOSPITAL_COMMUNITY)
Admission: EM | Admit: 2021-03-27 | Discharge: 2021-03-29 | DRG: 190 | Disposition: A | Discharge status: Home / Self Care | Payer: Medicare Other | Attending: Family Medicine | Admitting: Family Medicine

## 2021-03-27 ENCOUNTER — Encounter (HOSPITAL_COMMUNITY): Payer: Self-pay

## 2021-03-27 ENCOUNTER — Other Ambulatory Visit: Payer: Self-pay

## 2021-03-27 DIAGNOSIS — Z20822 Contact with and (suspected) exposure to covid-19: Secondary | ICD-10-CM | POA: Diagnosis present

## 2021-03-27 DIAGNOSIS — Z888 Allergy status to other drugs, medicaments and biological substances status: Secondary | ICD-10-CM

## 2021-03-27 DIAGNOSIS — N179 Acute kidney failure, unspecified: Secondary | ICD-10-CM | POA: Diagnosis present

## 2021-03-27 DIAGNOSIS — Z87891 Personal history of nicotine dependence: Secondary | ICD-10-CM

## 2021-03-27 DIAGNOSIS — Z86010 Personal history of colonic polyps: Secondary | ICD-10-CM

## 2021-03-27 DIAGNOSIS — I129 Hypertensive chronic kidney disease with stage 1 through stage 4 chronic kidney disease, or unspecified chronic kidney disease: Secondary | ICD-10-CM | POA: Diagnosis present

## 2021-03-27 DIAGNOSIS — J449 Chronic obstructive pulmonary disease, unspecified: Secondary | ICD-10-CM | POA: Diagnosis present

## 2021-03-27 DIAGNOSIS — J441 Chronic obstructive pulmonary disease with (acute) exacerbation: Principal | ICD-10-CM | POA: Diagnosis present

## 2021-03-27 DIAGNOSIS — N1831 Chronic kidney disease, stage 3a: Secondary | ICD-10-CM | POA: Diagnosis present

## 2021-03-27 DIAGNOSIS — Z79899 Other long term (current) drug therapy: Secondary | ICD-10-CM

## 2021-03-27 DIAGNOSIS — R911 Solitary pulmonary nodule: Secondary | ICD-10-CM | POA: Diagnosis present

## 2021-03-27 DIAGNOSIS — J9601 Acute respiratory failure with hypoxia: Secondary | ICD-10-CM | POA: Diagnosis present

## 2021-03-27 LAB — CBC WITH DIFFERENTIAL/PLATELET
Abs Immature Granulocytes: 0.04 10*3/uL (ref 0.00–0.07)
Basophils Absolute: 0 10*3/uL (ref 0.0–0.1)
Basophils Relative: 1 %
Eosinophils Absolute: 0.7 10*3/uL — ABNORMAL HIGH (ref 0.0–0.5)
Eosinophils Relative: 8 %
HCT: 43.6 % (ref 39.0–52.0)
Hemoglobin: 14.5 g/dL (ref 13.0–17.0)
Immature Granulocytes: 1 %
Lymphocytes Relative: 42 %
Lymphs Abs: 3.6 10*3/uL (ref 0.7–4.0)
MCH: 33.3 pg (ref 26.0–34.0)
MCHC: 33.3 g/dL (ref 30.0–36.0)
MCV: 100.2 fL — ABNORMAL HIGH (ref 80.0–100.0)
Monocytes Absolute: 1.7 10*3/uL — ABNORMAL HIGH (ref 0.1–1.0)
Monocytes Relative: 20 %
Neutro Abs: 2.3 10*3/uL (ref 1.7–7.7)
Neutrophils Relative %: 28 %
Platelets: 271 10*3/uL (ref 150–400)
RBC: 4.35 MIL/uL (ref 4.22–5.81)
RDW: 11.9 % (ref 11.5–15.5)
WBC: 8.3 10*3/uL (ref 4.0–10.5)
nRBC: 0 % (ref 0.0–0.2)

## 2021-03-27 LAB — COMPREHENSIVE METABOLIC PANEL
ALT: 25 U/L (ref 0–44)
AST: 30 U/L (ref 15–41)
Albumin: 3.8 g/dL (ref 3.5–5.0)
Alkaline Phosphatase: 53 U/L (ref 38–126)
Anion gap: 11 (ref 5–15)
BUN: 26 mg/dL — ABNORMAL HIGH (ref 8–23)
CO2: 26 mmol/L (ref 22–32)
Calcium: 9 mg/dL (ref 8.9–10.3)
Chloride: 99 mmol/L (ref 98–111)
Creatinine, Ser: 2.07 mg/dL — ABNORMAL HIGH (ref 0.61–1.24)
GFR, Estimated: 35 mL/min — ABNORMAL LOW (ref >60)
Glucose, Bld: 121 mg/dL — ABNORMAL HIGH (ref 70–99)
Potassium: 3.6 mmol/L (ref 3.5–5.1)
Sodium: 136 mmol/L (ref 135–145)
Total Bilirubin: 0.4 mg/dL (ref 0.3–1.2)
Total Protein: 7.1 g/dL (ref 6.5–8.1)

## 2021-03-27 LAB — BASIC METABOLIC PANEL
Anion gap: 8 (ref 5–15)
BUN: 24 mg/dL — ABNORMAL HIGH (ref 8–23)
CO2: 22 mmol/L (ref 22–32)
Calcium: 9 mg/dL (ref 8.9–10.3)
Chloride: 103 mmol/L (ref 98–111)
Creatinine, Ser: 1.62 mg/dL — ABNORMAL HIGH (ref 0.61–1.24)
GFR, Estimated: 47 mL/min — ABNORMAL LOW (ref >60)
Glucose, Bld: 169 mg/dL — ABNORMAL HIGH (ref 70–99)
Potassium: 3.9 mmol/L (ref 3.5–5.1)
Sodium: 133 mmol/L — ABNORMAL LOW (ref 135–145)

## 2021-03-27 LAB — SARS CORONAVIRUS 2 (TAT 6-24 HRS): SARS Coronavirus 2: NEGATIVE

## 2021-03-27 MED ORDER — ALBUTEROL (5 MG/ML) CONTINUOUS INHALATION SOLN
10.0000 mg/h | INHALATION_SOLUTION | RESPIRATORY_TRACT | Status: DC
  Administered 2021-03-27: 10 mg/h via RESPIRATORY_TRACT
  Filled 2021-03-27: qty 20

## 2021-03-27 MED ORDER — IPRATROPIUM-ALBUTEROL 0.5-2.5 (3) MG/3ML IN SOLN
3.0000 mL | RESPIRATORY_TRACT | Status: DC
  Administered 2021-03-27: 3 mL via RESPIRATORY_TRACT
  Filled 2021-03-27: qty 3

## 2021-03-27 MED ORDER — PREDNISONE 20 MG PO TABS
40.0000 mg | ORAL_TABLET | Freq: Every day | ORAL | Status: DC
  Administered 2021-03-28 – 2021-03-29 (×2): 40 mg via ORAL
  Filled 2021-03-27 (×3): qty 2

## 2021-03-27 MED ORDER — SODIUM CHLORIDE 0.9 % IV BOLUS
1000.0000 mL | Freq: Once | INTRAVENOUS | Status: AC
  Administered 2021-03-27: 1000 mL via INTRAVENOUS

## 2021-03-27 MED ORDER — DOXYCYCLINE HYCLATE 100 MG PO TABS
100.0000 mg | ORAL_TABLET | Freq: Two times a day (BID) | ORAL | Status: DC
  Administered 2021-03-27 – 2021-03-29 (×5): 100 mg via ORAL
  Filled 2021-03-27 (×5): qty 1

## 2021-03-27 MED ORDER — UMECLIDINIUM-VILANTEROL 62.5-25 MCG/INH IN AEPB
1.0000 | INHALATION_SPRAY | Freq: Every day | RESPIRATORY_TRACT | Status: DC
  Administered 2021-03-28 – 2021-03-29 (×2): 1 via RESPIRATORY_TRACT
  Filled 2021-03-27: qty 14

## 2021-03-27 MED ORDER — ALBUTEROL SULFATE (2.5 MG/3ML) 0.083% IN NEBU
2.5000 mg | INHALATION_SOLUTION | RESPIRATORY_TRACT | Status: DC | PRN
  Administered 2021-03-27: 2.5 mg via RESPIRATORY_TRACT
  Filled 2021-03-27: qty 3

## 2021-03-27 MED ORDER — IPRATROPIUM-ALBUTEROL 0.5-2.5 (3) MG/3ML IN SOLN
3.0000 mL | Freq: Four times a day (QID) | RESPIRATORY_TRACT | Status: DC
  Administered 2021-03-27 – 2021-03-29 (×10): 3 mL via RESPIRATORY_TRACT
  Filled 2021-03-27 (×9): qty 3

## 2021-03-27 MED ORDER — ENOXAPARIN SODIUM 40 MG/0.4ML IJ SOSY
40.0000 mg | PREFILLED_SYRINGE | Freq: Every day | INTRAMUSCULAR | Status: DC
  Administered 2021-03-27 – 2021-03-29 (×3): 40 mg via SUBCUTANEOUS
  Filled 2021-03-27 (×3): qty 0.4

## 2021-03-27 MED ORDER — AMLODIPINE BESYLATE 10 MG PO TABS
10.0000 mg | ORAL_TABLET | Freq: Every day | ORAL | Status: DC
  Administered 2021-03-27 – 2021-03-29 (×3): 10 mg via ORAL
  Filled 2021-03-27 (×2): qty 1
  Filled 2021-03-27: qty 2

## 2021-03-27 MED ORDER — PREDNISONE 20 MG PO TABS: 40.0000 mg | ORAL_TABLET | Freq: Every day | ORAL | Status: DC

## 2021-03-27 NOTE — ED Notes (Signed)
Patient is resting comfortably. 

## 2021-03-27 NOTE — ED Notes (Signed)
Floor nurse cannot take report at this time.  

## 2021-03-27 NOTE — Progress Notes (Signed)
Patient trialed off BIPAP and is currently on room air.

## 2021-03-27 NOTE — ED Notes (Signed)
Patient ate 50% of his breakfast

## 2021-03-27 NOTE — ED Notes (Signed)
Nurse report given to Aida Raider

## 2021-03-27 NOTE — Plan of Care (Signed)
  Problem: Education: Goal: Knowledge of General Education information will improve Description Including pain rating scale, medication(s)/side effects and non-pharmacologic comfort measures Outcome: Progressing   

## 2021-03-27 NOTE — H&P (Addendum)
[facility] Admission History and Physical Service Pager: [phone]  Patient name: [name] Medical record number: [id number] Date of birth: Nov 15, 1956 Age: 65 y.o. Gender: male  Primary Care Provider: Danna Hefty, DO Consultants: None Code Status: Full Contact: Trinity Hyland, wife, [phone]  Chief Complaint: Shortness of breath  Assessment and Plan: ZAYD BONET is a 65 y.o. male presenting with progressively worsening SOB over the last few days. PMH is significant for COPD, hypertension, tobacco abuse, known pulmonary nodule.  COPD exacerbation 1-1.5 weeks of progressively worsening shortness of breath, seen at [facility] on Saturday per his report and given medication, but not steroids.  Got acutely worse overnight.  No chest pain, fevers.  Has been having some cough, congestion, increased sputum, but remains clear.  No known sick contacts.  Has been compliant with inhalers, was used duonebs very regularly without improvement at home over the last few days.  Presented via EMS on BiPAP s/p albuterol, mag, solumedrol, noted to have diminished air movement.  In ED, transitioned to CAT x 1 hr and improved, able to rest on RA, but significantly dyspneic with any exertion.  On exam, continues to have wheezing L slightly > R, resting comfortably on RA with air movement throughout all lung fields.  O2 sats stable on exam at 94%.  CXR negative.  Initially tachycardic, had received albuterol en route.  Wells score 1.5 with tachycardia only, low risk for PE.  WBC WNL, no fevers, CXR without evidence of pneumonia, low suspicion.  No history of CHF and no fluid overload on exam, CXR also without pulmonary edema.  COVID test pending.  Home meds: Trelegy Ellpita, albuterol prn, duonebs prn.  Patient reports a history of multiple prior intubations 2/2 COPD exacerbations and given his continued dyspnea with presentation on BiPAP will treat inpatient for severe COPD  exacerbation. - place in obs, Dr. Lowella Bandy service - vitals per floor - continuous pulse ox - doxycycline x 5 days  - s/p solumedrol today, start ped 40mg  x5 days on 5/8 - sch duonebs q6h - albuterol prn q2h - Anoro Ellipta with systemic steroids per formulary, start home trelegy as able - monitor resp status  AKI Cr 2.07, increased from baseline of around 1.3.  Reports decreased PO intake in last few days, likely pre-renal.  Has been urinating normally per his report. - give 1L NS now - recheck BMP at 1600, dose fluids pending these results - BMP in AM - holding chlorthalidone  HTN Home meds norvasc 10mg , chlorthalidone 25mg  QD.  BP on admission 148/86. - cont norvasc - holding chlorthalidone per above  Tobacco Abuse No longer smoking since last hospitalization in October 2021.   Known pulmonary nodule Last image on 11/24/2020 and was stable at 10 mm in right lung apex.  Recommended repeat CT chest in 6 to 12 months for continued surveillance. -Outpatient follow-up  FEN/GI: Heart Healthy Prophylaxis: Lovenox  Disposition: place in obs, home tomorrow pending continued clinical improvement  History of Present Illness:  [name] is a 65 y.o. male presenting with shortness of breath, worsening over the last 1-1.5 weeks.  He reports that he has been having increased congestion and dyspnea on exertion.  States that he was seen at [facility] on 4/30, but was not treated for COPD.  Reports that he continued to worsen over the week.  Had been using inhalers as prescribed, no improvement with continued duonebs at home.  No fevers at home.   Acute  worsening of shortness of breath this AM, called EMS.  Per EMS report, had diminished breath sounds.  Was placed on BiPAP, given solumedrol, mag, albuterol en route.  Placed on CAT in ED x 1 hr.  Continued to have shortness of breath, especially with exertion.  Review Of Systems: Per HPI with the following additions:   Review of Systems   Constitutional: Negative for fever.  HENT: Positive for congestion.   Respiratory: Positive for cough, sputum production, shortness of breath and wheezing.   Cardiovascular: Negative for chest pain, palpitations and leg swelling.  Gastrointestinal: Negative for constipation, diarrhea, nausea and vomiting.  Genitourinary: Negative for dysuria, frequency and urgency.  Neurological: Negative for dizziness and headaches.    Patient Active Problem List   Diagnosis Date Noted  . COPD exacerbation ([place]) 03/27/2021  . Lung nodule < 6cm on CT 03/19/2019  . Primary hypertension   . History of tobacco use 02/06/2014  . Erectile dysfunction 12/31/2008  . COPD (chronic obstructive pulmonary disease) ([place]) 12/31/2008  . History of colonic polyps 01/18/2007    Past Medical History: Past Medical History:  Diagnosis Date  . Asthma   . COPD (chronic obstructive pulmonary disease) ([place])   . Hypertension   . Shortness of breath     Past Surgical History: Past Surgical History:  Procedure Laterality Date  . COLONOSCOPY      Social History: Social History   Tobacco Use  . Smoking status: Former Smoker    Packs/day: 0.50    Years: 30.00    Pack years: 15.00    Types: Cigarettes    Quit date: 08/25/2020    Years since quitting: 0.5  . Smokeless tobacco: Never Used  Vaping Use  . Vaping Use: Never used  Substance Use Topics  . Alcohol use: Yes    Comment: rare/occasional  . Drug use: Not Currently   Additional social history: quit smoking in October 2021 Please also refer to relevant sections of EMR.  Family History: Family History  Problem Relation Age of Onset  . Cancer Father     Allergies and Medications: Allergies  Allergen Reactions  . Lisinopril Swelling  . Spiriva Respimat [Tiotropium Bromide Monohydrate] Other (See Comments)    Red, glassy eyes   No current facility-administered medications on file prior to encounter.   Current Outpatient Medications on File  Prior to Encounter  Medication Sig Dispense Refill  . acetaminophen (TYLENOL) 500 MG tablet Take 1,000 mg by mouth as needed for mild pain.    Marland Kitchen albuterol (VENTOLIN HFA) 108 (90 Base) MCG/ACT inhaler INHALE 2 PUFFS EVERY 6 HOURS AS NEEDED FOR WHEEZING OR SHORTNESS OF BREATH (Patient taking differently: Inhale 2 puffs into the lungs every 6 (six) hours as needed for wheezing or shortness of breath.) 18 g 1  . amLODipine (NORVASC) 10 MG tablet TAKE 1 TABLET BY MOUTH EVERY DAY (Patient taking differently: Take 10 mg by mouth daily.) 90 tablet 3  . chlorthalidone (HYGROTON) 25 MG tablet Take 1 tablet (25 mg total) by mouth daily. 90 tablet 3  . Fluticasone-Umeclidin-Vilant (TRELEGY ELLIPTA) 100-62.5-25 MCG/INH AEPB Inhale 1 application into the lungs daily. (Patient taking differently: Inhale 1 puff into the lungs daily.) 3 each 3  . ipratropium-albuterol (DUONEB) 0.5-2.5 (3) MG/3ML SOLN INHALE 1 VIAL VIA NEBULIZER EVERY 6 HOURS AS NEEDED FOR SHORTNESS OF BREATH /  WHEEZING (Patient taking differently: Inhale 3 mLs into the lungs every 6 (six) hours as needed (shortness of breath/wheezing).) 270 mL 11  . promethazine-dextromethorphan (  PROMETHAZINE-DM) 6.25-15 MG/5ML syrup Take 5 mLs by mouth at bedtime as needed for cough. 100 mL 0  . predniSONE (DELTASONE) 20 MG tablet Take 2 tablets daily with breakfast. (Patient not taking: Reported on 03/27/2021) 10 tablet 0    Objective: BP 130/83   Pulse 94   Resp 19   Ht 5\' 9"  (1.753 m)   Wt 63.5 kg   SpO2 92%   BMI 20.67 kg/m   Physical Exam:  General: 65 y.o. male in NAD HENT: NCAT, EOMI, tacky mucus membrances Cardio: RRR no m/r/g Lungs: diffuse scattered wheezing with air movement throughout all lung fields, wheezing slightly worse on left than right, no increased WOB on RA Abdomen: Soft, non-tender to palpation, non-distended, positive bowel sounds Skin: warm and dry Extremities: No edema Neuro: grossly intact, sensation intact throughout Psych:  A&Ox4   Labs and Imaging: CBC BMET  Recent Labs  Lab 03/27/21 0250  WBC 8.3  HGB 14.5  HCT 43.6  PLT 271   Recent Labs  Lab 03/27/21 0250  NA 136  K 3.6  CL 99  CO2 26  BUN 26*  CREATININE 2.07*  GLUCOSE 121*  CALCIUM 9.0     DG Chest Portable 1 View  Result Date: 03/27/2021 CLINICAL DATA:  EVA EXAM: PORTABLE CHEST 1 VIEW COMPARISON:  Chest x-ray 03/06/2021, CT chest 11/24/2020 FINDINGS: The heart size and mediastinal contours are unchanged. Aortic arch calcification. No focal consolidation. No pulmonary edema. No pleural effusion. No pneumothorax. No acute osseous abnormality. IMPRESSION: No active disease. Electronically Signed   By: Iven Finn M.D.   On: 03/27/2021 03:41    Lekha Dancer, Bernita Raisin, DO 03/27/2021, 6:46 AM PGY-3, [facility] pager: [phone], text pages welcome

## 2021-03-27 NOTE — Evaluation (Signed)
Physical Therapy Evaluation Patient Details Name: [name] MRN: [id number] DOB: 1956-03-28 Today's Date: 03/27/2021   History of Present Illness  65 yo male with onset of SOB and esp exertional SOB was admitted and found to have COPD exac with clear chest xray for new findings.  PMHx:  COPD, HTN, pulm nodule, colon polyps  Clinical Impression  Pt was seen for mobility on side of bed, sitting with no support but is a bit weak and SOB with movement.  O2 sat pregait was 99% and post 97%.  Follow along with him for progression of strengthening and endurance activities, and transition care to home therapy when ready.  Will focus on acute PT goals as a outlined below.    Follow Up Recommendations Home health PT;Supervision for mobility/OOB    Equipment Recommendations  None recommended by PT    Recommendations for Other Services       Precautions / Restrictions Precautions Precautions: Fall Precaution Comments: monitor O2 sats and HR Restrictions Weight Bearing Restrictions: No Other Position/Activity Restrictions: new O2 use 2L      Mobility  Bed Mobility Overal bed mobility: Modified Independent             General bed mobility comments: able to get up to side of bed alone    Transfers Overall transfer level: Needs assistance Equipment used: 1 person hand held assist Transfers: Sit to/from Stand Sit to Stand: Min guard         General transfer comment: min guard for safety and due to lines  Ambulation/Gait Ambulation/Gait assistance: Min guard Gait Distance (Feet): 120 Feet Assistive device: 1 person hand held assist Gait Pattern/deviations: Step-to pattern;Step-through pattern;Decreased stride length Gait velocity: reduced Gait velocity interpretation: <1.31 ft/sec, indicative of household ambulator General Gait Details: on 2L O2 with maintenance of O2 sats during walk  Stairs            Wheelchair Mobility    Modified Rankin (Stroke Patients  Only)       Balance Overall balance assessment: Needs assistance Sitting-balance support: Feet supported Sitting balance-Leahy Scale: Good       Standing balance-Leahy Scale: Fair                               Pertinent Vitals/Pain Pain Assessment: No/denies pain    Home Living Family/patient expects to be discharged to:: Private residence Living Arrangements: Spouse/significant other Available Help at Discharge: Family;Available PRN/intermittently Type of Home: House Home Access: Stairs to enter Entrance Stairs-Rails: Right Entrance Stairs-Number of Steps: 5 Home Layout: One level Home Equipment: None Additional Comments: pt has given information that conflicts with previous admission    Prior Function Level of Independence: Independent         Comments: one to two daily walks     Hand Dominance   Dominant Hand: Right    Extremity/Trunk Assessment   Upper Extremity Assessment Upper Extremity Assessment: Overall WFL for tasks assessed    Lower Extremity Assessment Lower Extremity Assessment: Overall WFL for tasks assessed (weakness of hips)    Cervical / Trunk Assessment Cervical / Trunk Assessment: Normal  Communication   Communication: No difficulties  Cognition Arousal/Alertness: Awake/alert Behavior During Therapy: WFL for tasks assessed/performed Overall Cognitive Status: Within Functional Limits for tasks assessed (although gave some conflicting information about home details)  General Comments General comments (skin integrity, edema, etc.): tolerating a lot of movement considering he is SOB and on O2    Exercises     Assessment/Plan    PT Assessment Patient needs continued PT services  PT Problem List Cardiopulmonary status limiting activity;Decreased strength;Decreased balance;Decreased mobility       PT Treatment Interventions DME instruction;Gait training;Functional  mobility training;Stair training;Therapeutic activities;Therapeutic exercise;Balance training;Neuromuscular re-education;Patient/family education    PT Goals (Current goals can be found in the Care Plan section)  Acute Rehab PT Goals Patient Stated Goal: to feel more energetic and less SOB PT Goal Formulation: With patient Time For Goal Achievement: 04/03/21 Potential to Achieve Goals: Good    Frequency Min 3X/week   Barriers to discharge Inaccessible home environment;Decreased caregiver support home with wife    Co-evaluation               AM-PAC PT "6 Clicks" Mobility  Outcome Measure Help needed turning from your back to your side while in a flat bed without using bedrails?: None Help needed moving from lying on your back to sitting on the side of a flat bed without using bedrails?: None Help needed moving to and from a bed to a chair (including a wheelchair)?: None Help needed standing up from a chair using your arms (e.g., wheelchair or bedside chair)?: A Little Help needed to walk in hospital room?: A Little Help needed climbing 3-5 steps with a railing? : A Lot 6 Click Score: 20    End of Session Equipment Utilized During Treatment: Oxygen Activity Tolerance: Patient limited by fatigue;Treatment limited secondary to medical complications (Comment) Patient left: in bed;with call bell/phone within reach Nurse Communication: Mobility status PT Visit Diagnosis: Other abnormalities of gait and mobility (R26.89);Muscle weakness (generalized) (M62.81)    Time: 1610-9604 PT Time Calculation (min) (ACUTE ONLY): 16 min   Charges:   PT Evaluation $PT Eval Moderate Complexity: 1 Mod         Ramond Dial 03/27/2021, 4:47 PM  Mee Hives, PT MS Acute Rehab Dept. Number: [place] and [place]

## 2021-03-27 NOTE — ED Triage Notes (Signed)
Pt brought in by EMS on CPAP for respiratory distress. Pt has a history of COPD. EMS administered albuterol, mag sulfate, and solu-medrol.

## 2021-03-27 NOTE — Progress Notes (Signed)
Family Medicine Teaching Service Daily Progress Note Intern Pager: [phone]  Patient name: [name] Medical record number: [id number] Date of birth: Apr 09, 1956 Age: 65 y.o. Gender: male  Primary Care Provider: Danna Hefty, DO Consultants: Dietician  Code Status: Full Code   Pt Overview and Major Events to Date:  Hospital Day: 2 03/27/2021: admitted for Respiratory Distress   Assessment and Plan: [name] is a 65 y.o. male who presented w/ respiratory distress found to have COPD exacerbation.  PMHx s/f COPD, HTN, known pulmonary nodule.  COPD Exacerbation Mulitple prior intubations 2/2 COPD exacerbations.  Patient reports that he is stable, still requiring O2 with ambulation. Looks like he was able to be on room air a bit overnight, but is requiring 2-3L when I was in the room with RT this morning. Physical exam s/f continued wheezes throughout posterior lung fields. . Continue doxycycline, day 2/5 (5/7-5/11) . Continue steroid, day 2/5 (5/7 - solumedrol), Prednisone 5/9-5/11 . Scheduled duonebs and albuterol, per RT . Continue Anoro Ellipta 1 puff daily  . Monitor repiratory status, continuous pulse ox . O2, goal sat 88-92%   HTN BP: (106-168)/(63-98) 146/82 (05/08 0525) Home meds: amlodipine 10 mg, chlorthalidone 25 mg . Continue amlodipine 10 mg  . Restart chlorthalidone 25 mg due to AKI  AKI on CKD IIIa, improving  Improving. Creatinine 2.07>1.62> 1.46  Known Pulmonary nodule  Last image on 11/24/2020 and was stable at 10 mm in right lung apex.  Recommended repeat CT chest in 6 to 12 months for continued surveillance. -Outpatient follow-up  FEN/GI: Heart healthy  VTE prophylaxis: Lovenox 40 (CrCl>30)   Status is: Observation  The patient remains OBS appropriate and will d/c before 2 midnights.  Dispo: The patient is from: Home              Anticipated d/c is to: Home              Patient currently is not medically stable to d/c.   Difficult to  place patient No  Subjective:  NAEO. Patient reports feeling okay. He tried to get up to go to the bathroom without O2 and had difficulty breathing by the time he got to the commode.  Objective: Temp:  [98 F (36.7 C)-98.2 F (36.8 C)] 98 F (36.7 C) (05/08 0525) Pulse Rate:  [79-103] 79 (05/08 0525) Cardiac Rhythm: Normal sinus rhythm (05/07 1956) Resp:  [15-21] 15 (05/08 0525) BP: (106-168)/(63-98) 146/82 (05/08 0525) SpO2:  [95 %-100 %] 98 % (05/08 0525) Intake/Output      05/07 0701 05/08 0700 05/08 0701 05/09 0700   IV Piggyback 1000    Total Intake(mL/kg) 1000 (15.7)    Net +1000             Physical Exam: General: NAD, non-toxic, well-appearing, sitting comfortably on side of bed   HEENT: [place]/AT. EOMI.  Cardiovascular: RRR, normal S1, S2. B/L 2+ RP. No BLEE Respiratory: Wheezing throughout, mild respiratory distress. Accessory muscle use.  Abdomen: + BS. NT, ND, soft to palpation.  Extremities: Warm and well perfused. Moving spontaneously.  Integumentary: No obvious rashes, lesions, trauma on general exam. Neuro: CN grossly intact. No FND  Laboratory: I have personally read and reviewed all labs and imaging studies.  CBC: Recent Labs  Lab 03/27/21 0250 03/28/21 0050  WBC 8.3 5.2  NEUTROABS 2.3  --   HGB 14.5 12.8*  HCT 43.6 38.4*  MCV 100.2* 98.7  PLT 271 257   CMP: Recent Labs  Lab 03/27/21 0250 03/27/21 1615 03/28/21 0050  NA 136 133* 136  K 3.6 3.9 4.3  CL 99 103 105  CO2 26 22 22   GLUCOSE 121* 169* 107*  BUN 26* 24* 23  CREATININE 2.07* 1.62* 1.46*  CALCIUM 9.0 9.0 9.0  ALBUMIN 3.8  --   --    CBG: No results for input(s): GLUCAP in the last 168 hours. Micro: Covid Negative  Imaging/Diagnostic Tests: DG Chest Portable 1 View  Result Date: 03/27/2021 CLINICAL DATA:  EVA EXAM: PORTABLE CHEST 1 VIEW COMPARISON:  Chest x-ray 03/06/2021, CT chest 11/24/2020 FINDINGS: The heart size and mediastinal contours are unchanged. Aortic arch  calcification. No focal consolidation. No pulmonary edema. No pleural effusion. No pneumothorax. No acute osseous abnormality. IMPRESSION: No active disease. Electronically Signed   By: Iven Finn M.D.   On: 03/27/2021 03:41    EKG Interpretation  Date/Time:  Saturday Mar 27 2021 02:51:26 EDT Ventricular Rate:  117 PR Interval:  149 QRS Duration: 104 QT Interval:  335 QTC Calculation: 468 R Axis:   175 Text Interpretation: Sinus tachycardia Right axis deviation Minimal ST elevation, anterolateral leads Confirmed by Merrily Pew [phone]) on 03/27/2021 4:07:07 AM        Procedures:   Procedure Orders     Critical Care     EKG 12-Lead  Wilber Oliphant, MD 03/28/2021, 7:39 AM PGY-3, [place] Intern pager: [phone], text pages welcome

## 2021-03-27 NOTE — ED Notes (Signed)
Breakfast tray ordered  Pt provided drinks with crackers and snacks

## 2021-03-27 NOTE — ED Notes (Signed)
Vital signs stable. 

## 2021-03-27 NOTE — ED Provider Notes (Signed)
[facility] EMERGENCY DEPARTMENT Provider Note   CSN: [id number] Arrival date & time:        History Chief Complaint  Patient presents with  . Respiratory Distress    [name] is a 65 y.o. male.   Shortness of Breath Severity:  Moderate Onset quality:  Gradual Timing:  Constant Progression:  Worsening Chronicity:  New Context: not activity   Relieved by:  None tried Worsened by:  Nothing Ineffective treatments:  None tried Associated symptoms: cough   Associated symptoms: no headaches and no sputum production        Past Medical History:  Diagnosis Date  . Asthma   . COPD (chronic obstructive pulmonary disease) ([place])   . Hypertension   . Shortness of breath     Patient Active Problem List   Diagnosis Date Noted  . COPD exacerbation ([place]) 03/27/2021  . Lung nodule < 6cm on CT 03/19/2019  . Primary hypertension   . History of tobacco use 02/06/2014  . Erectile dysfunction 12/31/2008  . COPD (chronic obstructive pulmonary disease) ([place]) 12/31/2008  . History of colonic polyps 01/18/2007    Past Surgical History:  Procedure Laterality Date  . COLONOSCOPY         Family History  Problem Relation Age of Onset  . Cancer Father     Social History   Tobacco Use  . Smoking status: Former Smoker    Packs/day: 0.50    Years: 30.00    Pack years: 15.00    Types: Cigarettes    Quit date: 08/25/2020    Years since quitting: 0.5  . Smokeless tobacco: Never Used  Vaping Use  . Vaping Use: Never used  Substance Use Topics  . Alcohol use: Yes    Comment: rare/occasional  . Drug use: Not Currently    Home Medications Prior to Admission medications   Medication Sig Start Date End Date Taking? Authorizing Provider  acetaminophen (TYLENOL) 500 MG tablet Take 1,000 mg by mouth as needed for mild pain.   Yes [provider]  albuterol (VENTOLIN HFA) 108 (90 Base) MCG/ACT inhaler INHALE 2 PUFFS EVERY 6 HOURS AS NEEDED FOR  WHEEZING OR SHORTNESS OF BREATH Patient taking differently: Inhale 2 puffs into the lungs every 6 (six) hours as needed for wheezing or shortness of breath. 01/29/21  Yes Mullis, Kiersten P, DO  amLODipine (NORVASC) 10 MG tablet TAKE 1 TABLET BY MOUTH EVERY DAY Patient taking differently: Take 10 mg by mouth daily. 12/14/20  Yes Mullis, Kiersten P, DO  chlorthalidone (HYGROTON) 25 MG tablet Take 1 tablet (25 mg total) by mouth daily. 01/11/21  Yes Mullis, Kiersten P, DO  Fluticasone-Umeclidin-Vilant (TRELEGY ELLIPTA) 100-62.5-25 MCG/INH AEPB Inhale 1 application into the lungs daily. Patient taking differently: Inhale 1 puff into the lungs daily. 09/30/20  Yes Mullis, Kiersten P, DO  ipratropium-albuterol (DUONEB) 0.5-2.5 (3) MG/3ML SOLN INHALE 1 VIAL VIA NEBULIZER EVERY 6 HOURS AS NEEDED FOR SHORTNESS OF BREATH /  WHEEZING Patient taking differently: Inhale 3 mLs into the lungs every 6 (six) hours as needed (shortness of breath/wheezing). 01/25/21  Yes Mullis, Kiersten P, DO  promethazine-dextromethorphan (PROMETHAZINE-DM) 6.25-15 MG/5ML syrup Take 5 mLs by mouth at bedtime as needed for cough. 03/06/21  Yes Jaynee Eagles, PA-C  predniSONE (DELTASONE) 20 MG tablet Take 2 tablets daily with breakfast. Patient not taking: Reported on 03/27/2021 03/06/21   Jaynee Eagles, PA-C    Allergies    Lisinopril and Spiriva respimat [tiotropium bromide monohydrate]  Review  of Systems   Review of Systems  Respiratory: Positive for cough and shortness of breath. Negative for sputum production.   Neurological: Negative for headaches.  All other systems reviewed and are negative.   Physical Exam Updated Vital Signs BP 130/83   Pulse 94   Resp 19   Ht 5\' 9"  (1.753 m)   Wt 63.5 kg   SpO2 92%   BMI 20.67 kg/m   Physical Exam Vitals and nursing note reviewed.  Constitutional:      Appearance: He is well-developed.  HENT:     Head: Normocephalic and atraumatic.     Nose: No congestion or rhinorrhea.      Mouth/Throat:     Mouth: Mucous membranes are moist.     Pharynx: Oropharynx is clear.  Eyes:     Pupils: Pupils are equal, round, and reactive to light.  Cardiovascular:     Rate and Rhythm: Normal rate.  Pulmonary:     Effort: Pulmonary effort is normal. Tachypnea present.     Breath sounds: Decreased air movement present. Decreased breath sounds and wheezing present.  Abdominal:     General: Abdomen is flat. There is no distension.  Musculoskeletal:        General: Normal range of motion.     Cervical back: Normal range of motion.  Skin:    General: Skin is warm and dry.     Coloration: Skin is not jaundiced or pale.  Neurological:     General: No focal deficit present.     Mental Status: He is alert.     ED Results / Procedures / Treatments   Labs (all labs ordered are listed, but only abnormal results are displayed) Labs Reviewed  CBC WITH DIFFERENTIAL/PLATELET - Abnormal; Notable for the following components:      Result Value   MCV 100.2 (*)    Monocytes Absolute 1.7 (*)    Eosinophils Absolute 0.7 (*)    All other components within normal limits  COMPREHENSIVE METABOLIC PANEL - Abnormal; Notable for the following components:   Glucose, Bld 121 (*)    BUN 26 (*)    Creatinine, Ser 2.07 (*)    GFR, Estimated 35 (*)    All other components within normal limits  SARS CORONAVIRUS 2 (TAT 6-24 HRS)  BASIC METABOLIC PANEL    EKG EKG Interpretation  Date/Time:  Saturday Mar 27 2021 02:51:26 EDT Ventricular Rate:  117 PR Interval:  149 QRS Duration: 104 QT Interval:  335 QTC Calculation: 468 R Axis:   175 Text Interpretation: Sinus tachycardia Right axis deviation Minimal ST elevation, anterolateral leads Confirmed by Merrily Pew [phone]) on 03/27/2021 4:07:07 AM   Radiology DG Chest Portable 1 View  Result Date: 03/27/2021 CLINICAL DATA:  EVA EXAM: PORTABLE CHEST 1 VIEW COMPARISON:  Chest x-ray 03/06/2021, CT chest 11/24/2020 FINDINGS: The heart size and  mediastinal contours are unchanged. Aortic arch calcification. No focal consolidation. No pulmonary edema. No pleural effusion. No pneumothorax. No acute osseous abnormality. IMPRESSION: No active disease. Electronically Signed   By: Iven Finn M.D.   On: 03/27/2021 03:41    Procedures .Critical Care Performed by: Merrily Pew, MD Authorized by: Merrily Pew, MD   Critical care provider statement:    Critical care time (minutes):  45   Critical care was necessary to treat or prevent imminent or life-threatening deterioration of the following conditions:  Respiratory failure   Critical care was time spent personally by me on the following activities:  Discussions  with consultants, evaluation of patient's response to treatment, examination of patient, ordering and performing treatments and interventions, ordering and review of laboratory studies, ordering and review of radiographic studies, pulse oximetry, re-evaluation of patient's condition, obtaining history from patient or surrogate and review of old charts     Medications Ordered in ED Medications  amLODipine (NORVASC) tablet 10 mg (has no administration in time range)  enoxaparin (LOVENOX) injection 40 mg (has no administration in time range)  doxycycline (VIBRA-TABS) tablet 100 mg (has no administration in time range)  umeclidinium-vilanterol (ANORO ELLIPTA) 62.5-25 MCG/INH 1 puff (has no administration in time range)  albuterol (PROVENTIL) (2.5 MG/3ML) 0.083% nebulizer solution 2.5 mg (has no administration in time range)  ipratropium-albuterol (DUONEB) 0.5-2.5 (3) MG/3ML nebulizer solution 3 mL (has no administration in time range)  sodium chloride 0.9 % bolus 1,000 mL (has no administration in time range)  predniSONE (DELTASONE) tablet 40 mg (has no administration in time range)    ED Course  I have reviewed the triage vital signs and the nursing notes.  Pertinent labs & imaging results that were available during my care  of the patient were reviewed by me and considered in my medical decision making (see chart for details).    MDM Rules/Calculators/A&P                          65 yo M here with COPD exacerbation. Even after bipap, multiple albuterol treatments to include continuous here, solumedrol with EMS continued tachypnea with exertion but no severe hypoxia. duonebs initiated. Will admit for same.   Final Clinical Impression(s) / ED Diagnoses Final diagnoses:  None    Rx / DC Orders ED Discharge Orders    None       Kimber Fritts, Corene Cornea, MD 03/27/21 [phone]

## 2021-03-27 NOTE — ED Notes (Signed)
Patient denies pain and is resting comfortably.  

## 2021-03-28 DIAGNOSIS — Z20822 Contact with and (suspected) exposure to covid-19: Secondary | ICD-10-CM | POA: Diagnosis present

## 2021-03-28 DIAGNOSIS — J441 Chronic obstructive pulmonary disease with (acute) exacerbation: Secondary | ICD-10-CM | POA: Diagnosis present

## 2021-03-28 DIAGNOSIS — Z86010 Personal history of colonic polyps: Secondary | ICD-10-CM | POA: Diagnosis not present

## 2021-03-28 DIAGNOSIS — Z79899 Other long term (current) drug therapy: Secondary | ICD-10-CM | POA: Diagnosis not present

## 2021-03-28 DIAGNOSIS — I129 Hypertensive chronic kidney disease with stage 1 through stage 4 chronic kidney disease, or unspecified chronic kidney disease: Secondary | ICD-10-CM | POA: Diagnosis present

## 2021-03-28 DIAGNOSIS — Z888 Allergy status to other drugs, medicaments and biological substances status: Secondary | ICD-10-CM | POA: Diagnosis not present

## 2021-03-28 DIAGNOSIS — N1831 Chronic kidney disease, stage 3a: Secondary | ICD-10-CM | POA: Diagnosis present

## 2021-03-28 DIAGNOSIS — J9601 Acute respiratory failure with hypoxia: Secondary | ICD-10-CM | POA: Diagnosis present

## 2021-03-28 DIAGNOSIS — Z87891 Personal history of nicotine dependence: Secondary | ICD-10-CM | POA: Diagnosis not present

## 2021-03-28 DIAGNOSIS — N179 Acute kidney failure, unspecified: Secondary | ICD-10-CM | POA: Diagnosis present

## 2021-03-28 DIAGNOSIS — R911 Solitary pulmonary nodule: Secondary | ICD-10-CM | POA: Diagnosis present

## 2021-03-28 LAB — BASIC METABOLIC PANEL
Anion gap: 9 (ref 5–15)
BUN: 23 mg/dL (ref 8–23)
CO2: 22 mmol/L (ref 22–32)
Calcium: 9 mg/dL (ref 8.9–10.3)
Chloride: 105 mmol/L (ref 98–111)
Creatinine, Ser: 1.46 mg/dL — ABNORMAL HIGH (ref 0.61–1.24)
GFR, Estimated: 53 mL/min — ABNORMAL LOW (ref >60)
Glucose, Bld: 107 mg/dL — ABNORMAL HIGH (ref 70–99)
Potassium: 4.3 mmol/L (ref 3.5–5.1)
Sodium: 136 mmol/L (ref 135–145)

## 2021-03-28 LAB — CBC
HCT: 38.4 % — ABNORMAL LOW (ref 39.0–52.0)
Hemoglobin: 12.8 g/dL — ABNORMAL LOW (ref 13.0–17.0)
MCH: 32.9 pg (ref 26.0–34.0)
MCHC: 33.3 g/dL (ref 30.0–36.0)
MCV: 98.7 fL (ref 80.0–100.0)
Platelets: 257 10*3/uL (ref 150–400)
RBC: 3.89 MIL/uL — ABNORMAL LOW (ref 4.22–5.81)
RDW: 11.9 % (ref 11.5–15.5)
WBC: 5.2 10*3/uL (ref 4.0–10.5)
nRBC: 0 % (ref 0.0–0.2)

## 2021-03-28 MED ORDER — CHLORTHALIDONE 25 MG PO TABS
25.0000 mg | ORAL_TABLET | Freq: Every day | ORAL | Status: DC
  Administered 2021-03-28 – 2021-03-29 (×2): 25 mg via ORAL
  Filled 2021-03-28 (×3): qty 1

## 2021-03-28 NOTE — Hospital Course (Addendum)
[name] is a 65 y.o. male presenting with progressively worsening SOB over the last few days second to COPD exacerbation. PMH is significant for COPD, hypertension, tobacco abuse, known pulmonary nodule. Below is his hospital course.   COPD Exacerbation Patient presenting with progressively worsening dyspnea and cough with increased sputum production.  Presented by EMS on BiPAP s/p albuterol, magnesium, methylprednisolone.  He received CAT for 1 hour with improvement.  BiPAP weaned off after about 1 hour.  He was treated with steroids and antibiotics, discharged with prednisone and doxycycline to complete a 5-day total course of steroids and antibiotics.  Noted to have desaturation to 83% while ambulating so was discharged on home oxygen.  AKI superimposed on CKD IIIa Elevated creatinine 2.07 increased from baseline of around 1.3 on admission, given 1L NS with improvement.  Serum creatinine on discharge 1.70.  Other chronic conditions stable.    Follow Up Recommendations 1. Known 10 mm pulmonary nodule in right lung apex seen on imaging 11/24/20. Recommend repeat imaging for surveillance with CT Chest in 6-12 months.  2. Discharged on home O2. 3. Check BMP to assess kidney function.

## 2021-03-28 NOTE — Evaluation (Signed)
Occupational Therapy Evaluation Patient Details Name: [name] MRN: [id number] DOB: 01-25-1956 Today's Date: 03/28/2021    History of Present Illness 65 yo male with onset of SOB and esp exertional SOB was admitted and found to have COPD exac with clear chest xray for new findings.  PMHx:  COPD, HTN, pulm nodule, colon polyps   Clinical Impression   Pt is functioning modified independently in ADL and ADL transfers. Sp02 97% on 2L02. Pt able to verbalize and/or demonstrate knowledge of energy conservation strategies and pursed lip breathing. No further OT needs. Recommend ADL with nursing staff.     Follow Up Recommendations  No OT follow up    Equipment Recommendations  None recommended by OT (declining tub equipment for energy conservation)    Recommendations for Other Services       Precautions / Restrictions Restrictions Weight Bearing Restrictions: No Other Position/Activity Restrictions: does not use 02 at home      Mobility Bed Mobility Overal bed mobility: Modified Independent             General bed mobility comments: HOB up slightly    Transfers Overall transfer level: Modified independent               General transfer comment: pt has been routinely taking himself to the bathroom, removes pulse ox and manages extended 02 tubing    Balance     Sitting balance-Leahy Scale: Normal       Standing balance-Leahy Scale: Fair                             ADL either performed or assessed with clinical judgement   ADL Overall ADL's : Modified independent                                       General ADL Comments: Pt is knowledgeable in pursed lip breathing and energy conservation strategies.     Vision Patient Visual Report: No change from baseline       Perception     Praxis      Pertinent Vitals/Pain Pain Assessment: No/denies pain     Hand Dominance Right   Extremity/Trunk Assessment Upper  Extremity Assessment Upper Extremity Assessment: Overall WFL for tasks assessed   Lower Extremity Assessment Lower Extremity Assessment: Defer to PT evaluation   Cervical / Trunk Assessment Cervical / Trunk Assessment: Normal   Communication Communication Communication: No difficulties   Cognition Arousal/Alertness: Awake/alert Behavior During Therapy: WFL for tasks assessed/performed Overall Cognitive Status: Within Functional Limits for tasks assessed                                     General Comments       Exercises     Shoulder Instructions      Home Living Family/patient expects to be discharged to:: Private residence Living Arrangements: Spouse/significant other Available Help at Discharge: Family;Available PRN/intermittently Type of Home: House Home Access: Stairs to enter [employer] of Steps: 5 Entrance Stairs-Rails: Right Home Layout: One level     Bathroom Shower/Tub: Teacher, early years/pre: Standard     Home Equipment: None          Prior Functioning/Environment Level of Independence: Independent  OT Problem List:        OT Treatment/Interventions:      OT Goals(Current goals can be found in the care plan section) Acute Rehab OT Goals Patient Stated Goal: to feel more energetic and less SOB  OT Frequency:     Barriers to D/C:            Co-evaluation              AM-PAC OT "6 Clicks" Daily Activity     Outcome Measure Help from another person eating meals?: None Help from another person taking care of personal grooming?: None Help from another person toileting, which includes using toliet, bedpan, or urinal?: None Help from another person bathing (including washing, rinsing, drying)?: None Help from another person to put on and taking off regular upper body clothing?: None Help from another person to put on and taking off regular lower body clothing?: None 6 Click  Score: 24   End of Session Equipment Utilized During Treatment: Oxygen (2L)  Activity Tolerance: Patient tolerated treatment well Patient left: in bed;with call bell/phone within reach (EOB)  OT Visit Diagnosis: Other (comment) (decreased activity tolerance)                Time: 5997-7414 OT Time Calculation (min): 13 min Charges:  OT General Charges $OT Visit: 1 Visit OT Evaluation $OT Eval Low Complexity: 1 Low  Malka So 03/28/2021, 9:27 AM  Nestor Lewandowsky, OTR/L Acute Rehabilitation Services Pager: [phone] Office: [phone]

## 2021-03-28 NOTE — Plan of Care (Signed)

## 2021-03-28 NOTE — Plan of Care (Signed)
  Problem: Activity: Goal: Risk for activity intolerance will decrease Outcome: Progressing   Problem: Nutrition: Goal: Adequate nutrition will be maintained Outcome: Progressing   Problem: Coping: Goal: Level of anxiety will decrease Outcome: Progressing   Problem: Pain Managment: Goal: General experience of comfort will improve Outcome: Progressing   Problem: Safety: Goal: Ability to remain free from injury will improve Outcome: Progressing   

## 2021-03-28 NOTE — Care Management Obs Status (Signed)
[facility] NOTIFICATION   Patient Details  Name: BRANKO STEEVES MRN: [id number] Date of Birth: 1956/05/27   Medicare Observation Status Notification Given:  Yes    Carles Collet, RN 03/28/2021, 4:06 PM

## 2021-03-29 ENCOUNTER — Other Ambulatory Visit (HOSPITAL_COMMUNITY): Payer: Self-pay

## 2021-03-29 LAB — BASIC METABOLIC PANEL
Anion gap: 8 (ref 5–15)
BUN: 33 mg/dL — ABNORMAL HIGH (ref 8–23)
CO2: 29 mmol/L (ref 22–32)
Calcium: 9.6 mg/dL (ref 8.9–10.3)
Chloride: 100 mmol/L (ref 98–111)
Creatinine, Ser: 1.7 mg/dL — ABNORMAL HIGH (ref 0.61–1.24)
GFR, Estimated: 44 mL/min — ABNORMAL LOW (ref >60)
Glucose, Bld: 89 mg/dL (ref 70–99)
Potassium: 4 mmol/L (ref 3.5–5.1)
Sodium: 137 mmol/L (ref 135–145)

## 2021-03-29 MED ORDER — DOXYCYCLINE HYCLATE 100 MG PO TABS
100.0000 mg | ORAL_TABLET | Freq: Two times a day (BID) | ORAL | 0 refills | Status: AC
  Filled 2021-03-29: qty 5, 3d supply, fill #0

## 2021-03-29 MED ORDER — PREDNISONE 20 MG PO TABS
40.0000 mg | ORAL_TABLET | Freq: Every day | ORAL | 0 refills | Status: DC
  Filled 2021-03-29: qty 4, 2d supply, fill #0

## 2021-03-29 NOTE — TOC Initial Note (Signed)
Transition of Care [facility]) - Initial/Assessment Note    Patient Details  Name: [name] MRN: [id number] Date of Birth: [birth year]  Transition of Care [facility]) CM/SW Contact:    Joanne Chars, LCSW Phone Number: 03/29/2021, 10:11 AM  Clinical Narrative:    CSW met with pt regarding discharge recommendation for [facility].  Pt declines [facility] services, reports he is very active, walks one mile every day and does not think he needs additional help.  CSW described [facility] services and pt will let CSW know if he changes his mind. Pt reports no current services at home, no home O2.  No equipment recommendations made and pt reports no DME needs. Pt is currently on O2 at the hospital.  Permission given to speak with wife [place].  PCP in place.  Pt is vaccinated for covid.                Expected Discharge Plan: Home/Self Care Barriers to Discharge: Continued Medical Work up   Patient Goals and CMS Choice Patient states their goals for this hospitalization and ongoing recovery are:: "get as close as I can back to normal" CMS Medicare.gov Compare Post Acute Care list provided to::  (na)    Expected Discharge Plan and Services Expected Discharge Plan: Home/Self Care     Post Acute Care Choice: NA Living arrangements for the past 2 months: Single Family Home                           HH Arranged: Patient Refused HH          Prior Living Arrangements/Services Living arrangements for the past 2 months: Single Family Home Lives with:: Spouse Patient language and need for interpreter reviewed:: Yes Do you feel safe going back to the place where you live?: Yes      Need for Family Participation in Patient Care: No (Comment) Care giver support system in place?: Yes (comment) Current home services: Other (comment) (none) Criminal Activity/Legal Involvement Pertinent to Current Situation/Hospitalization: No - Comment as needed  Activities of Daily Living Home Assistive Devices/Equipment:  None ADL Screening (condition at time of admission) Patient's cognitive ability adequate to safely complete daily activities?: Yes Is the patient deaf or have difficulty hearing?: No Does the patient have difficulty seeing, even when wearing glasses/contacts?: No Does the patient have difficulty concentrating, remembering, or making decisions?: No Patient able to express need for assistance with ADLs?: Yes Does the patient have difficulty dressing or bathing?: No Independently performs ADLs?: Yes (appropriate for developmental age) Does the patient have difficulty walking or climbing stairs?: No Weakness of Legs: None Weakness of Arms/Hands: None  Permission Sought/Granted Permission sought to share information with : Family Supports Permission granted to share information with : Yes, Verbal Permission Granted  Share Information with NAME: wife [name]           Emotional Assessment Appearance:: Appears stated age Attitude/Demeanor/Rapport: Engaged Affect (typically observed): Appropriate,Pleasant Orientation: : Oriented to Self,Oriented to Place,Oriented to  Time,Oriented to Situation Alcohol / Substance Use: Not Applicable Psych Involvement: No (comment)  Admission diagnosis:  COPD exacerbation ([place]) [J44.1] COPD (chronic obstructive pulmonary disease) ([place]) [J44.9] Patient Active Problem List   Diagnosis Date Noted  . COPD exacerbation ([place]) 03/27/2021  . Lung nodule < 6cm on CT 03/19/2019  . Primary hypertension   . History of tobacco use 02/06/2014  . Erectile dysfunction 12/31/2008  . COPD (chronic obstructive pulmonary disease) ([place]) 12/31/2008  .  History of colonic polyps 01/18/2007   PCP:  Danna Hefty, DO Pharmacy:   [facility], [address], [address] [place], [place] [address] Phone: [phone] Fax: [phone]  [id number] - [place], [place] [address] Phone: [phone] Fax: [phone]     Social Determinants of Health (SDOH) Interventions    Readmission Risk Interventions No flowsheet data found.

## 2021-03-29 NOTE — TOC Transition Note (Signed)
Transition of Care [facility]) - CM/SW Discharge Note   Patient Details  Name: [name] MRN: [id number] Date of Birth: 10/18/1956  Transition of Care [facility]) CM/SW Contact:  Joanne Chars, LCSW Phone Number: 03/29/2021, 2:28 PM   Clinical Narrative:   Pt discharging home with home oxygen provided by [employer].  Pt declined [place] services.  Wife will transport home.  No other needs identified.      Final next level of care: Home/Self Care Barriers to Discharge: Barriers Resolved   Patient Goals and CMS Choice Patient states their goals for this hospitalization and ongoing recovery are:: "get as close as I can back to normal" CMS Medicare.gov Compare Post Acute Care list provided to::  (na)    Discharge Placement                       Discharge Plan and Services     Post Acute Care Choice: NA          DME Arranged: Oxygen DME Agency: [employer] Date DME Agency Contacted: 03/29/21 Time DME Agency Contacted: 0947 Representative spoke with at DME Agency: [place] Arranged: Patient Refused [place]          Social Determinants of Health ([place]) Interventions     Readmission Risk Interventions No flowsheet data found.

## 2021-03-29 NOTE — Discharge Instructions (Signed)
Dear [name],   Thank you so much for allowing [place] to be part of your care!  You were admitted to [facility] for COPD exacerbation. You were treated with steroids, antibiotics, and oxygen.  We are sending you home with oxygen because your oxygen levels dropped while walking.   POST-HOSPITAL & CARE INSTRUCTIONS 1. You will need to continue the antibiotics and steroids for two more days until 5/11. 2. We have made you a follow-up appointment with your PCP [name] on 5/16 at 10:10 AM at the [facility].  Please call our office at [phone] if you need to reschedule. 3. Please let PCP/Specialists know of any changes that were made.  4. Please see medications section of this packet for any medication changes.   DOCTOR'S APPOINTMENT & FOLLOW UP CARE INSTRUCTIONS  Future Appointments  Date Time Provider [place]  04/05/2021 10:10 AM [name], Anthony Endo, [name] [facility] [place]    RETURN PRECAUTIONS: Please call the clinic or return to the ED if your shortness of breath is worsening.  Take care and be well!  [facility]  [place], [place] [address] [phone]

## 2021-03-29 NOTE — Plan of Care (Signed)
Pt discharging home.

## 2021-03-29 NOTE — Progress Notes (Signed)
Physical Therapy Treatment Patient Details Name: WOODFORD STREGE MRN: [id number] DOB: [birth year] Today's Date: 03/29/2021    History of Present Illness 65 yo male with onset of SOB and esp exertional SOB was admitted and found to have COPD exac with clear chest xray for new findings.  PMHx:  COPD, HTN, pulm nodule, colon polyps    PT Comments    Pt doing well with mobility and no further PT needed.  Ready for dc from PT standpoint. Will need home O2. See additional note.      Follow Up Recommendations  No PT follow up     Equipment Recommendations  None recommended by PT    Recommendations for Other Services       Precautions / Restrictions Precautions Precautions: None Restrictions Other Position/Activity Restrictions: does not use 02 at home    Mobility  Bed Mobility Overal bed mobility: Independent                  Transfers Overall transfer level: Independent   Transfers: Sit to/from Stand Sit to Stand: Independent            Ambulation/Gait Ambulation/Gait assistance: Independent Gait Distance (Feet): 450 Feet Assistive device: None Gait Pattern/deviations: WFL(Within Functional Limits) Gait velocity: normal Gait velocity interpretation: >4.37 ft/sec, indicative of normal walking speed General Gait Details: Steady gait   Stairs             Wheelchair Mobility    Modified Rankin (Stroke Patients Only)       Balance Overall balance assessment: Independent                                          Cognition Arousal/Alertness: Awake/alert Behavior During Therapy: WFL for tasks assessed/performed Overall Cognitive Status: Within Functional Limits for tasks assessed                                        Exercises      General Comments General comments (skin integrity, edema, etc.): SpO2 83% on RA with amb. 94% on 2L with amb      Pertinent Vitals/Pain      Home Living                       Prior Function            PT Goals (current goals can now be found in the care plan section) Progress towards PT goals: Goals met/education completed, patient discharged from PT    Frequency           PT Plan Discharge plan needs to be updated    Co-evaluation              AM-PAC PT "6 Clicks" Mobility   Outcome Measure  Help needed turning from your back to your side while in a flat bed without using bedrails?: None Help needed moving from lying on your back to sitting on the side of a flat bed without using bedrails?: None Help needed moving to and from a bed to a chair (including a wheelchair)?: None Help needed standing up from a chair using your arms (e.g., wheelchair or bedside chair)?: None Help needed to walk in hospital room?: None Help needed climbing 3-5 steps with a railing? :  None 6 Click Score: 24    End of Session Equipment Utilized During Treatment: Oxygen Activity Tolerance: Patient tolerated treatment well Patient left: in bed;with call bell/phone within reach (sitting EOB) Nurse Communication: Other (comment) (SpO2 levels) PT Visit Diagnosis: Other abnormalities of gait and mobility (R26.89)     Time: 7628-3151 PT Time Calculation (min) (ACUTE ONLY): 19 min  Charges:  $Gait Training: 8-22 mins                     [place] Pager [phone] Office [place] 03/29/2021, 12:48 PM

## 2021-03-29 NOTE — Progress Notes (Signed)
SATURATION QUALIFICATIONS: (This note is used to comply with regulatory documentation for home oxygen)  Patient Saturations on Room Air at Rest = 92%  Patient Saturations on Room Air while Ambulating = 83%  Patient Saturations on 3 Liters of oxygen while Ambulating = 94%  Please briefly explain why patient needs home oxygen:Unable to maintain adequate oxygenation without supplemental O2.  [place] Pager [phone] Office [phone]

## 2021-03-29 NOTE — Discharge Summary (Signed)
[facility] Discharge Summary  Patient name: [name] Medical record number: [id number] Date of birth: 05-Dec-1955 Age: 65 y.o. Gender: male Date of Admission: 03/27/2021  Date of Discharge: 03/29/2021  Admitting Physician: Zenia Resides, MD  Primary Care Provider: Danna Hefty, DO Consultants: none  Indication for Hospitalization: COPD exacerbation  Discharge Diagnoses/Problem List:  Active Problems:   COPD (chronic obstructive pulmonary disease) ([place])   COPD exacerbation ([place])   AKI on CKD IIIa   Pulmonary nodule  Disposition: home  Discharge Condition: Stable  Discharge Exam:  General: Older male resting comfortably in bed, NAD CV: RRR, no murmurs Pulm: Faint diffuse wheezes, mildly decreased breath sounds, breathing comfortably on room air Abd: Soft, nontender, positive bowel sounds Ext: Warm and well perfused, no edema   Brief Hospital Course:  [name] is a 65 y.o. male presenting with progressively worsening SOB over the last few days second to COPD exacerbation. PMH is significant for COPD, hypertension, tobacco abuse, known pulmonary nodule. Below is his hospital course.   COPD Exacerbation Patient presenting with progressively worsening dyspnea and cough with increased sputum production.  Presented by EMS on BiPAP s/p albuterol, magnesium, methylprednisolone.  He received CAT for 1 hour with improvement.  BiPAP weaned off after about 1 hour.  He was treated with steroids and antibiotics, discharged with prednisone and doxycycline to complete a 5-day total course of steroids and antibiotics.  Noted to have desaturation to 83% while ambulating so was discharged on home oxygen.  AKI superimposed on CKD IIIa Elevated creatinine 2.07 increased from baseline of around 1.3 on admission, given 1L NS with improvement.  Serum creatinine on discharge 1.70.  Other chronic conditions stable.    Follow Up Recommendations 1. Known  10 mm pulmonary nodule in right lung apex seen on imaging 11/24/20. Recommend repeat imaging for surveillance with CT Chest in 6-12 months.  2. Discharged on home O2. 3. Check BMP to assess kidney function.    Significant Procedures: None  Significant Labs and Imaging:  Recent Labs  Lab 03/27/21 0250 03/28/21 0050  WBC 8.3 5.2  HGB 14.5 12.8*  HCT 43.6 38.4*  PLT 271 257   Recent Labs  Lab 03/27/21 0250 03/27/21 1615 03/28/21 0050 03/29/21 0454  NA 136 133* 136 137  K 3.6 3.9 4.3 4.0  CL 99 103 105 100  CO2 26 22 22 29   GLUCOSE 121* 169* 107* 89  BUN 26* 24* 23 33*  CREATININE 2.07* 1.62* 1.46* 1.70*  CALCIUM 9.0 9.0 9.0 9.6  ALKPHOS 53  --   --   --   AST 30  --   --   --   ALT 25  --   --   --   ALBUMIN 3.8  --   --   --     DG Chest 2 View  Result Date: 03/06/2021 CLINICAL DATA:  Cough and shortness of breath.  History of COPD. EXAM: CHEST - 2 VIEW COMPARISON:  August 25, 2020 FINDINGS: The heart size and mediastinal contours are within normal limits. Both lungs are clear. Lungs are hyperinflated. The visualized skeletal structures are unremarkable. IMPRESSION: No active cardiopulmonary disease. COPD. Electronically Signed   By: Abelardo Diesel M.D.   On: 03/06/2021 13:57   DG Chest Portable 1 View  Result Date: 03/27/2021 CLINICAL DATA:  EVA EXAM: PORTABLE CHEST 1 VIEW COMPARISON:  Chest x-ray 03/06/2021, CT chest 11/24/2020 FINDINGS: The heart size and mediastinal contours are unchanged.  Aortic arch calcification. No focal consolidation. No pulmonary edema. No pleural effusion. No pneumothorax. No acute osseous abnormality. IMPRESSION: No active disease. Electronically Signed   By: Iven Finn M.D.   On: 03/27/2021 03:41     Results/Tests Pending at Time of Discharge: none  Discharge Medications:  Allergies as of 03/29/2021      Reactions   Lisinopril Swelling   Spiriva Respimat [tiotropium Bromide Monohydrate] Other (See Comments)   Red, glassy eyes       Medication List    TAKE these medications   acetaminophen 500 MG tablet Commonly known as: TYLENOL Take 1,000 mg by mouth as needed for mild pain.   albuterol 108 (90 Base) MCG/ACT inhaler Commonly known as: VENTOLIN HFA INHALE 2 PUFFS EVERY 6 HOURS AS NEEDED FOR WHEEZING OR SHORTNESS OF BREATH What changed:   how much to take  how to take this  when to take this  reasons to take this  additional instructions   amLODipine 10 MG tablet Commonly known as: NORVASC TAKE 1 TABLET BY MOUTH EVERY DAY   chlorthalidone 25 MG tablet Commonly known as: HYGROTON Take 1 tablet (25 mg total) by mouth daily.   doxycycline 100 MG tablet Commonly known as: VIBRA-TABS Take 1 tablet (100 mg total) by mouth 2 (two) times daily for 3 days.   ipratropium-albuterol 0.5-2.5 (3) MG/3ML Soln Commonly known as: DUONEB INHALE 1 VIAL VIA NEBULIZER EVERY 6 HOURS AS NEEDED FOR SHORTNESS OF BREATH /  WHEEZING What changed:   how much to take  how to take this  when to take this  reasons to take this  additional instructions   predniSONE 20 MG tablet Commonly known as: DELTASONE Take 2 tablets (40 mg total) by mouth daily with breakfast. Start taking on: Mar 30, 2021   promethazine-dextromethorphan 6.25-15 MG/5ML syrup Commonly known as: PROMETHAZINE-DM Take 5 mLs by mouth at bedtime as needed for cough.   Trelegy Ellipta 100-62.5-25 MCG/INH Aepb Generic drug: Fluticasone-Umeclidin-Vilant Inhale 1 application into the lungs daily. What changed: how much to take            Durable Medical Equipment  (From admission, onward)         Start     Ordered   03/29/21 1427  For home use only DME oxygen  Once       Question Answer Comment  Length of Need Lifetime   Mode or (Route) Nasal cannula   Liters per Minute 2   Frequency Continuous (stationary and portable oxygen unit needed)   Oxygen delivery system Gas      03/29/21 1427          Discharge Instructions: Please  refer to Patient Instructions section of EMR for full details.  Patient was counseled important signs and symptoms that should prompt return to medical care, changes in medications, dietary instructions, activity restrictions, and follow up appointments.   Follow-Up Appointments:  Follow-up Information    [facility] Follow up.   Why: This company, now called [employer], will provide your home oxygen.  Contact information: [address] [place] [address] [phone]               Zola Button, MD 03/29/2021, 3:05 PM PGY-1, [place]

## 2021-03-31 ENCOUNTER — Ambulatory Visit: Payer: Medicare Other

## 2021-04-03 NOTE — Progress Notes (Signed)
   Subjective:   Patient ID: [name]    DOB: Jul 31, 1956, 65 y.o. male   MRN: [id number]  [name] is a 65 y.o. male with a history of HTN, COPD, ED, h/o colon polyps, h/o tobacco use, lung nodule <6cm on CT here for hospital follow up  Hospital Follow up 2/2 COPD Exacerbation: Patient admitted to hospital on 03/27/21 to 03/29/21 for acute hypoxic respiratory failure 2/2 COPD exacerbation. Initially required BiPAP, albuterol, magnesium, and methylprednisolone. He received CAT x 1 hour with improvement. He was able to be weaned off BIPAP to [place]. He was treated with steroids and antibiotics with slow improvement. He was discharged on Prednisone and Doxycycline to complete a 5 day course. He was also discharged on oxygen due to hypoxia with ambulation.  Current inhalers include Albuterol PRN, Trelegy (Fluticasone-Umeclinidin-Vilant) + PRN Duonebs  Today he notes his cough has returned to its normal baseline. He didn't need oxygen overnight or during the day. Only seldomly feels SOB. He presents today without oxygen.  He completed antibiotics and steroids. He continues to use his inhalers as prescribed.   AKI on CKD IIIA Noted to have elevated Cr to 2.07 (baseline 1.3) on admission. He was treated with fluids with improvement of Cr to 1.7 by discharge.   Review of Systems:  Per HPI.   Objective:   BP 124/80   Pulse 63   Wt 135 lb 6.4 oz (61.4 kg)   SpO2 97%   BMI 20.00 kg/m  Vitals and nursing note reviewed.  General: pleasant older thin male, sitting comfortably in exam chair, well nourished, well developed, in no acute distress with non-toxic appearance CV: regular rate and rhythm without murmurs, rubs, or gallops Lungs: breath sounds are soft but appreciated throughout with long expiratory phase of breathing, speaking in full sentences Neuro: Alert and oriented, speech normal  Ambulation with Pulse Ox: At rest without oxygen: 96% Ambulation without oxygen:  97%   Assessment & Plan:   COPD (chronic obstructive pulmonary disease) ([place]) Acute on chronic. History of multiple exacerbations - at least 4 exacerbation in the last 6 months, one requiring hospitalization. History of intubation in 2020. Compliant with Trelegy. Per patient, has history of taking Prednisone 5mg  daily many years ago which helped control exacerbations. I have been requesting patient follow up with Pulmonology consistently over the last year but appears patient has not done this. Overall, acute symptoms have resolved. He is satting well on room air at rest and with ambulation.  - Scheduled patient with Pulm on 04/23/21. STRONGLY encouraged to attend this appointment - Considered starting PDE-4 inhibitor or alternative agent to help control exacerbations however will defer to pulm given upcoming appointment - continue Trelegy as prescribed with Albuterol and Duonebs as needed - discontinue oxygen therapy   Primary hypertension Initially elevated, repeat normotensive.  AKI (acute kidney injury) ([place]) BMP today to monitor.  Orders Placed This Encounter  Procedures  . Basic Metabolic Panel  . Ambulatory referral to Pulmonology    Referral Priority:   Routine    Referral Type:   Consultation    Referral Reason:   Specialty Services Required    Requested Specialty:   Pulmonary Disease    Number of Visits Requested:   1   No orders of the defined types were placed in this encounter.     Anthony Marble, DO PGY-3, [facility] 04/05/2021 5:34 PM

## 2021-04-05 ENCOUNTER — Ambulatory Visit (INDEPENDENT_AMBULATORY_CARE_PROVIDER_SITE_OTHER): Payer: Medicare Other | Admitting: Family Medicine

## 2021-04-05 ENCOUNTER — Encounter: Payer: Self-pay | Admitting: Family Medicine

## 2021-04-05 ENCOUNTER — Other Ambulatory Visit: Payer: Self-pay

## 2021-04-05 VITALS — BP 124/80 | HR 63 | Wt 135.4 lb

## 2021-04-05 DIAGNOSIS — J441 Chronic obstructive pulmonary disease with (acute) exacerbation: Secondary | ICD-10-CM | POA: Diagnosis not present

## 2021-04-05 DIAGNOSIS — I10 Essential (primary) hypertension: Secondary | ICD-10-CM | POA: Diagnosis not present

## 2021-04-05 DIAGNOSIS — N179 Acute kidney failure, unspecified: Secondary | ICD-10-CM | POA: Insufficient documentation

## 2021-04-05 NOTE — Assessment & Plan Note (Signed)
Acute on chronic. History of multiple exacerbations - at least 4 exacerbation in the last 6 months, one requiring hospitalization. History of intubation in 2020. Compliant with Trelegy. Per patient, has history of taking Prednisone 5mg  daily many years ago which helped control exacerbations. I have been requesting patient follow up with Pulmonology consistently over the last year but appears patient has not done this. Overall, acute symptoms have resolved. He is satting well on room air at rest and with ambulation.  - Scheduled patient with Pulm on 04/23/21. STRONGLY encouraged to attend this appointment - Considered starting PDE-4 inhibitor or alternative agent to help control exacerbations however will defer to pulm given upcoming appointment - continue Trelegy as prescribed with Albuterol and Duonebs as needed - discontinue oxygen therapy

## 2021-04-05 NOTE — Assessment & Plan Note (Signed)
BMP today to monitor.

## 2021-04-05 NOTE — Patient Instructions (Signed)
It was a pleasure to see you today!  Thank you for choosing [facility] for your primary care.   Our plans for today were:  You can discontinue the oxygen  I have scheduled you appointment with Pulmonology on 04/23/21. Please Please PLEASE attend this appointment.   I am getting labs today to check your kidney function  We are checking some labs today, I will call you if they are abnormal will send you a MyChart message or a letter if they are normal.  If you do not hear about your labs in the next 2 weeks please let us know.  BRING ALL OF YOUR MEDICATIONS WITH YOU TO EVERY VISIT Best Wishes,   Mina Marble, DO

## 2021-04-05 NOTE — Assessment & Plan Note (Signed)
Initially elevated, repeat normotensive.

## 2021-04-06 LAB — BASIC METABOLIC PANEL
BUN/Creatinine Ratio: 24 (ref 10–24)
BUN: 35 mg/dL — ABNORMAL HIGH (ref 8–27)
CO2: 21 mmol/L (ref 20–29)
Calcium: 10.2 mg/dL (ref 8.6–10.2)
Chloride: 102 mmol/L (ref 96–106)
Creatinine, Ser: 1.47 mg/dL — ABNORMAL HIGH (ref 0.76–1.27)
Glucose: 73 mg/dL (ref 65–99)
Potassium: 4 mmol/L (ref 3.5–5.2)
Sodium: 138 mmol/L (ref 134–144)
eGFR: 52 mL/min/{1.73_m2} — ABNORMAL LOW (ref >59)

## 2021-04-23 ENCOUNTER — Ambulatory Visit (INDEPENDENT_AMBULATORY_CARE_PROVIDER_SITE_OTHER): Payer: Medicare Other | Admitting: Pulmonary Disease

## 2021-04-23 ENCOUNTER — Other Ambulatory Visit: Payer: Self-pay

## 2021-04-23 ENCOUNTER — Encounter: Payer: Self-pay | Admitting: Pulmonary Disease

## 2021-04-23 DIAGNOSIS — J9611 Chronic respiratory failure with hypoxia: Secondary | ICD-10-CM | POA: Insufficient documentation

## 2021-04-23 DIAGNOSIS — J441 Chronic obstructive pulmonary disease with (acute) exacerbation: Secondary | ICD-10-CM | POA: Diagnosis not present

## 2021-04-23 DIAGNOSIS — R911 Solitary pulmonary nodule: Secondary | ICD-10-CM

## 2021-04-23 MED ORDER — PREDNISONE 10 MG PO TABS: ORAL_TABLET | ORAL | 0 refills | Status: AC

## 2021-04-23 NOTE — Assessment & Plan Note (Signed)
Low-dose CT follow-up 6 months in July 2022. Stable on CT chest and favor scarring in the right upper lobe

## 2021-04-23 NOTE — Assessment & Plan Note (Signed)
Schedule spirometry pre and post. We will treat him as an exacerbation, noninfectious, likely related to weather change Prednisone 10 mg tabs  Take 2 tabs daily with food x 5ds, then 1 tab daily with food x 5ds then STOP Stay on Trelegy once daily, rinse mouth after use,  use albuterol on an as-needed basis

## 2021-04-23 NOTE — Assessment & Plan Note (Signed)
He was discharged on oxygen after recent hospitalization 03/2021.  No evidence of hypercarbia on VBG. He did not desaturate on exertion today, he can use oxygen on an as-needed basis

## 2021-04-23 NOTE — Progress Notes (Signed)
Subjective:    Patient ID: [name], male    DOB: [birth year], 65 y.o.   MRN: [id number]  HPI  65 year old smoker presents to establish care for COPD due to repeated exacerbations  He smoked about 2 packs/week starting as a teenager more than 30 pack years.  He is cut down to 1 to 2 cigarettes/day.  He was able to quit in 2021 but is started again.  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  He also drinks 6-8 beers per week.  Accompanied by his wife, family today who provides some of the history. He reports dyspnea on exertion for 20 years.  He reports intermittent wheezing.  Breathing is worse with weather changes and certain smells and odors he is maintained on a regimen of Trelegy which he uses daily with albuterol MDI and albuterol nebs for breakthrough which she can use up to 4 times a day.  He reports cough productive of clear sputum for the last 2 days. I reviewed hospital discharge summary from 03/2021, he was hospitalized for 2 days for COPD exacerbation triggered by URI symptoms.  He required transient BiPAP, on discharge his oxygen saturation was 83% on ambulation and he was discharged on home oxygen.  He only uses this on a as needed basis now.  Oxygen saturation is 95% on arrival today. He also had AKI with creatinine increased to 2.0 from baseline of 1.3 and on discharge this was 1.7 He reports an episode of mechanical ventilation 6 years ago  Significant tests/ events reviewed  LDCT 11/2020 >> Centrilobular emphsyema .Stable 10 mm nodular opacity in the medial right lung apex with adjacent subpleural changes. This is likely scarring  - compared to 08/2020   Spirometry 01/2014 ratio 72, FEV1 1.97/68%, FVC 76%, no bronchodilator response  Past Medical History:  Diagnosis Date  . Asthma   . COPD (chronic obstructive pulmonary disease) ([place])   . Hypertension   . Shortness of breath    Past Surgical History:  Procedure  Laterality Date  . COLONOSCOPY      Allergies  Allergen Reactions  . Lisinopril Swelling  . Spiriva Respimat [Tiotropium Bromide Monohydrate] Other (See Comments)    Red, glassy eyes    Social History   Socioeconomic History  . Marital status: Married    Spouse name: Not on file  . Number of children: Not on file  . Years of education: Not on file  . Highest education level: Not on file  Occupational History  . Not on file  Tobacco Use  . Smoking status: Current Some Day Smoker    Packs/day: 0.50    Years: 30.00    Pack years: 15.00    Types: Cigarettes    Last attempt to quit: 08/25/2020    Years since quitting: 0.6  . Smokeless tobacco: Never Used  . Tobacco comment: 2 cigs smoked a few days a week 04/23/21 [facility]   Vaping Use  . Vaping Use: Never used  Substance and Sexual Activity  . Alcohol use: Yes    Comment: rare/occasional  . Drug use: Not Currently  . Sexual activity: Yes    Partners: Female  Other Topics Concern  . Not on file  Social History Narrative  . Not on file   Social Determinants of Health   Financial Resource Strain: Not on file  Food Insecurity: Not on file  Transportation Needs: Not on file  Physical Activity: Not on file  Stress: Not on file  Social Connections: Not on file  Intimate Partner Violence: Not on file    Family History  Problem Relation Age of Onset  . Cancer Father       Review of Systems Shortness of breath with activity, cough productive of clear sputum Irregular heartbeats Headaches Nasal congestion Sneezing  Constitutional: negative for anorexia, fevers and sweats  Eyes: negative for irritation, redness and visual disturbance  Ears, nose, mouth, throat, and face: negative for earaches, epistaxisand sore throat   Cardiovascular: negative for chest pain,  lower extremity edema, orthopnea, palpitations and syncope  Gastrointestinal: negative for abdominal pain, constipation, diarrhea, melena, nausea and vomiting   Genitourinary:negative for dysuria, frequency and hematuria  Hematologic/lymphatic: negative for bleeding, easy bruising and lymphadenopathy  Musculoskeletal:negative for arthralgias, muscle weakness and stiff joints  Neurological: negative for coordination problems, gait problems, headaches and weakness  Endocrine: negative for diabetic symptoms including polydipsia, polyuria and weight loss     Objective:   Physical Exam  Gen. Pleasant, thin, in no distress, normal affect ENT - no pallor,icterus, no post nasal drip Neck: No JVD, no thyromegaly, no carotid bruits Lungs: no use of accessory muscles, no dullness to percussion, decreased breath sounds bilateral without rales or rhonchi  Cardiovascular: Rhythm regular, heart sounds  normal, no murmurs or gallops, no peripheral edema Abdomen: soft and non-tender, no hepatosplenomegaly, BS normal. Musculoskeletal: No deformities, no cyanosis or clubbing Neuro:  alert, non focal       Assessment & Plan:

## 2021-04-23 NOTE — Patient Instructions (Signed)
  Ambulatory saturation. Schedule spirometry pre and post. Low-dose CT follow-up 6 months in July 2022.  Prednisone 10 mg tabs  Take 2 tabs daily with food x 5ds, then 1 tab daily with food x 5ds then STOP Stay on Trelegy once daily, rinse mouth after use,  use albuterol on an as-needed basis

## 2021-05-21 ENCOUNTER — Other Ambulatory Visit: Payer: Self-pay

## 2021-05-21 ENCOUNTER — Telehealth: Payer: Self-pay | Admitting: Pulmonary Disease

## 2021-05-21 ENCOUNTER — Ambulatory Visit (HOSPITAL_COMMUNITY)
Admission: EM | Admit: 2021-05-21 | Discharge: 2021-05-21 | Disposition: A | Discharge status: Home / Self Care | Payer: Medicare Other | Attending: Physician Assistant | Admitting: Physician Assistant

## 2021-05-21 ENCOUNTER — Encounter (HOSPITAL_COMMUNITY): Payer: Self-pay | Admitting: Emergency Medicine

## 2021-05-21 DIAGNOSIS — J441 Chronic obstructive pulmonary disease with (acute) exacerbation: Secondary | ICD-10-CM | POA: Diagnosis not present

## 2021-05-21 DIAGNOSIS — R0602 Shortness of breath: Secondary | ICD-10-CM

## 2021-05-21 DIAGNOSIS — R059 Cough, unspecified: Secondary | ICD-10-CM

## 2021-05-21 MED ORDER — PREDNISONE 10 MG (21) PO TBPK: ORAL_TABLET | ORAL | 0 refills | Status: DC

## 2021-05-21 MED ORDER — DOXYCYCLINE HYCLATE 100 MG PO CAPS: 100.0000 mg | ORAL_CAPSULE | Freq: Two times a day (BID) | ORAL | 0 refills | Status: DC

## 2021-05-21 NOTE — ED Triage Notes (Signed)
COPD flare up started Sunday. Uses O2 PRN. Is on 2L today. Requesting prednisone. Exertional shortness of breath has worsened over last few days.   Denies chest pain.

## 2021-05-21 NOTE — Telephone Encounter (Signed)
Primary Pulmonologist: Dr. [name] Last office visit and with whom: 04/23/21 Dr [name] What do we see them for (pulmonary problems): COPD Last OV assessment/plan:  Instructions   Return in about 3 months (around 07/24/2021).   Ambulatory saturation. Schedule spirometry pre and post. Low-dose CT follow-up 6 months in July 2022.   Prednisone 10 mg tabs  Take 2 tabs daily with food x 5ds, then 1 tab daily with food x 5ds then STOP Stay on Trelegy once daily, rinse mouth after use, use albuterol on an as-needed basis        Reason for call Called and spoke with Patient.  Patient stated he is having a COPD flare.  Patient stated it started earlier this week. Patient stated he is having increased sob and nonproductive cough. Patient denies fever or chills.  Patient stated he has O2 that he uses as needed. Patient stated last night he slept with 2L O2, because he felt like it was difficult to breathe.Patient has continued to wear 2L O2 this morning and stated the O2 does help. Patient stated he gets winded very easily the last few days and is requesting a Prednisone taper to help. Patient stated he has been using his Trelegy daily. Patient has also been using nebs and albuterol inhaler. Patient requested Prednisone to be sent to [facility].   Message routed to Dr. [name] to advise    Allergies  Allergen Reactions   Lisinopril Swelling   Spiriva Respimat [Tiotropium Bromide Monohydrate] Other (See Comments)    Red, glassy eyes    Immunization History  Administered Date(s) Administered   Fluad Quad(high Dose 65+) 08/07/2020   H1N1 12/02/2008   Influenza Whole 09/06/2007, 12/02/2008   Influenza, Seasonal, Injecte, Preservative Fre 08/01/2014   Influenza,inj,Quad PF,6+ Mos 09/04/2015, 09/01/2018, 10/09/2019   Influenza-Unspecified 08/21/2013   PFIZER(Purple Top)SARS-COV-2 Vaccination 03/05/2020, 03/30/2020, 09/30/2020   Pneumococcal Conjugate-13 10/23/2015   Pneumococcal  Polysaccharide-23 08/21/2004, 08/28/2020   Td 01/19/2006   Zoster, Live 12/04/2015

## 2021-05-21 NOTE — ED Provider Notes (Signed)
[facility]    CSN: [id number] Arrival date & time: 05/21/21  1546      History   Chief Complaint Chief Complaint  Patient presents with   COPD    Flare up - few days    HPI [name] is a 65 y.o. male.   Patient presents today with a several day history of increased shortness of breath, oxygen requirement, increased sputum production.  He reports that symptoms began approximately 4 days ago but have worsened significantly in the past 24 to 48 hours.  He is now requiring 2 L of oxygen continuously whereas typically he only uses as night or as needed.  He denies any known sick contacts.  Denies any recent antibiotic use.  He has been using inhaler regimen as prescribed by pulmonology without improvement of symptoms.  He is having shortness of breath to the point that it is impacting his ability to perform daily activities.  He is a former smoker but does not smoke any longer.  He did attempt to call his primary care provider and pulmonologist to be evaluated with symptoms but they were unable to see him prompting evaluation here at urgent care.  He does report previous hospitalization related to COPD and is hoping that starting medication now will prevent him from having to go to hospital in the near future.  He is up-to-date on immunizations including influenza and COVID-19.   Past Medical History:  Diagnosis Date   Asthma    COPD (chronic obstructive pulmonary disease) ([place])    Hypertension    Shortness of breath     Patient Active Problem List   Diagnosis Date Noted   Chronic respiratory failure with hypoxia ([place]) 04/23/2021   AKI (acute kidney injury) ([place]) 04/05/2021   COPD exacerbation ([place]) 03/27/2021   Pulmonary nodule 1 cm or greater in diameter 03/19/2019   Primary hypertension    History of tobacco use 02/06/2014   Erectile dysfunction 12/31/2008   COPD (chronic obstructive pulmonary disease) ([place]) 12/31/2008   History of colonic polyps 01/18/2007     Past Surgical History:  Procedure Laterality Date   COLONOSCOPY         Home Medications    Prior to Admission medications   Medication Sig Start Date End Date Taking? Authorizing Provider  doxycycline (VIBRAMYCIN) 100 MG capsule Take 1 capsule (100 mg total) by mouth 2 (two) times daily. 05/21/21  Yes Alenah Sarria K, PA-C  predniSONE (STERAPRED UNI-PAK 21 TAB) 10 MG (21) TBPK tablet As directed 05/21/21  Yes Marquise Wicke K, PA-C  acetaminophen (TYLENOL) 500 MG tablet Take 1,000 mg by mouth as needed for mild pain.    [provider]  albuterol (VENTOLIN HFA) 108 (90 Base) MCG/ACT inhaler INHALE 2 PUFFS EVERY 6 HOURS AS NEEDED FOR WHEEZING OR SHORTNESS OF BREATH Patient taking differently: Inhale 2 puffs into the lungs every 6 (six) hours as needed for wheezing or shortness of breath. 01/29/21   Mullis, Kiersten P, DO  amLODipine (NORVASC) 10 MG tablet TAKE 1 TABLET BY MOUTH EVERY DAY Patient taking differently: Take 10 mg by mouth daily. 12/14/20   Mullis, Kiersten P, DO  chlorthalidone (HYGROTON) 25 MG tablet Take 1 tablet (25 mg total) by mouth daily. 01/11/21   Mullis, Kiersten P, DO  Fluticasone-Umeclidin-Vilant (TRELEGY ELLIPTA) 100-62.5-25 MCG/INH AEPB Inhale 1 application into the lungs daily. Patient taking differently: Inhale 1 puff into the lungs daily. 09/30/20   Mullis, Kiersten P, DO  ipratropium-albuterol (DUONEB) 0.5-2.5 (  3) MG/3ML SOLN INHALE 1 VIAL VIA NEBULIZER EVERY 6 HOURS AS NEEDED FOR SHORTNESS OF BREATH /  WHEEZING Patient taking differently: Inhale 3 mLs into the lungs every 6 (six) hours as needed (shortness of breath/wheezing). 01/25/21   Mullis, Kiersten P, DO  promethazine-dextromethorphan (PROMETHAZINE-DM) 6.25-15 MG/5ML syrup Take 5 mLs by mouth at bedtime as needed for cough. 03/06/21   Jaynee Eagles, PA-C    Family History Family History  Problem Relation Age of Onset   Cancer Father     Social History Social History   Tobacco Use   Smoking  status: Some Days    Packs/day: 0.50    Years: 30.00    Pack years: 15.00    Types: Cigarettes    Last attempt to quit: 08/25/2020    Years since quitting: 0.7   Smokeless tobacco: Never   Tobacco comments:    2 cigs smoked a few days a week 04/23/21 [facility]   Vaping Use   Vaping Use: Never used  Substance Use Topics   Alcohol use: Yes    Comment: rare/occasional   Drug use: Not Currently     Allergies   Lisinopril and Spiriva respimat [tiotropium bromide monohydrate]   Review of Systems Review of Systems  Constitutional:  Positive for activity change and fatigue. Negative for appetite change and fever.  HENT:  Negative for congestion, sinus pressure, sneezing and sore throat.   Respiratory:  Positive for cough, shortness of breath and wheezing. Negative for chest tightness.   Cardiovascular:  Negative for chest pain.  Gastrointestinal:  Negative for abdominal pain, diarrhea, nausea and vomiting.  Neurological:  Negative for dizziness, light-headedness and headaches.    Physical Exam Triage Vital Signs ED Triage Vitals  Enc Vitals Group     BP 05/21/21 1644 136/81     Pulse Rate 05/21/21 1644 88     Resp 05/21/21 1644 16     Temp 05/21/21 1644 98.3 F (36.8 C)     Temp Source 05/21/21 1644 Oral     SpO2 05/21/21 1644 100 %     Weight --      Height --      Head Circumference --      Peak Flow --      Pain Score 05/21/21 1645 0     Pain Loc --      Pain Edu? --      Excl. in [place]? --    No data found.  Updated Vital Signs BP 136/81   Pulse 88   Temp 98.3 F (36.8 C) (Oral)   Resp 16   SpO2 100%   Visual Acuity Right Eye Distance:   Left Eye Distance:   Bilateral Distance:    Right Eye Near:   Left Eye Near:    Bilateral Near:     Physical Exam Vitals reviewed.  Constitutional:      General: He is awake.     Appearance: Normal appearance. He is normal weight. He is not ill-appearing.     Comments: Very pleasant male appears stated age no acute  distress  HENT:     Head: Normocephalic and atraumatic.     Mouth/Throat:     Pharynx: Uvula midline. No oropharyngeal exudate or posterior oropharyngeal erythema.  Cardiovascular:     Rate and Rhythm: Normal rate and regular rhythm.     Heart sounds: Normal heart sounds, S1 normal and S2 normal. No murmur heard. Pulmonary:     Effort: Pulmonary effort is normal.  Breath sounds: No stridor. Wheezing present. No rhonchi or rales.     Comments: Scattered wheezes throughout lung fields worse in bilateral bases Abdominal:     General: Bowel sounds are normal.     Palpations: Abdomen is soft.     Tenderness: There is no abdominal tenderness.  Neurological:     Mental Status: He is alert.  Psychiatric:        Behavior: Behavior is cooperative.     UC Treatments / Results  Labs (all labs ordered are listed, but only abnormal results are displayed) Labs Reviewed - No data to display  EKG   Radiology No results found.  Procedures Procedures (including critical care time)  Medications Ordered in UC Medications - No data to display  Initial Impression / Assessment and Plan / UC Course  I have reviewed the triage vital signs and the nursing notes.  Pertinent labs & imaging results that were available during my care of the patient were reviewed by me and considered in my medical decision making (see chart for details).      Vital signs and physical exam reassuring today; no indication for emergent evaluation or imaging.  Concern for COPD exacerbation given increased sputum production and increased oxygen requirement.  Patient was started on prednisone taper with instruction not to take NSAIDs with this medication due to risk of GI bleeding.  He was prescribed doxycycline twice daily for 10 days with instruction to avoid prolonged sun exposure due to photosensitivity associated with this medication.  Discussed potential utility of screening for viral infections with patient  declined this today as current symptoms are similar to previous COPD exacerbations.  He is to continue inhaler regimen as prescribed by specialist.  Discussed alarm symptoms or warrant emergent evaluation.  Strict return precautions given to which patient expressed understanding.  Recommend he follow-up within 1 week with PCP to ensure symptom improvement unless need to be seen sooner.  Final Clinical Impressions(s) / UC Diagnoses   Final diagnoses:  COPD exacerbation (HCC)  Cough  Shortness of breath     Discharge Instructions      Start prednisone taper.  Do not take NSAIDs including aspirin, ibuprofen/Advil, naproxen/Aleve with this medication as it can cause stomach bleeding.  Take doxycycline twice daily for 10 days.  This antibiotic can make you sensitive to the sun so try to stay out of the sun while on it.  Continue with your oxygen as needed.  Get a pulse oximeter to monitor oxygen saturation.  If this is dropping under 93% you need to be reevaluated.  If your symptoms are not improving within the next 24 to 48 hours you need to be seen again.  If you have any worsening symptoms please go to the hospital.     ED Prescriptions     Medication Sig Dispense Auth. Provider   predniSONE (STERAPRED UNI-PAK 21 TAB) 10 MG (21) TBPK tablet As directed 21 tablet Stalin Gruenberg K, PA-C   doxycycline (VIBRAMYCIN) 100 MG capsule Take 1 capsule (100 mg total) by mouth 2 (two) times daily. 20 capsule Latisha Lasch, Derry Skill, PA-C      PDMP not reviewed this encounter.   Terrilee Croak, PA-C 05/21/21 1739

## 2021-05-21 NOTE — Discharge Instructions (Addendum)
Start prednisone taper.  Do not take NSAIDs including aspirin, ibuprofen/Advil, naproxen/Aleve with this medication as it can cause stomach bleeding.  Take doxycycline twice daily for 10 days.  This antibiotic can make you sensitive to the sun so try to stay out of the sun while on it.  Continue with your oxygen as needed.  Get a pulse oximeter to monitor oxygen saturation.  If this is dropping under 93% you need to be reevaluated.  If your symptoms are not improving within the next 24 to 48 hours you need to be seen again.  If you have any worsening symptoms please go to the hospital.

## 2021-05-25 MED ORDER — PREDNISONE 10 MG PO TABS: ORAL_TABLET | ORAL | 0 refills | Status: DC

## 2021-05-25 NOTE — Telephone Encounter (Signed)
I called and spoke with the pt and notified of response per Dr [name]  Pt verbalized understanding  Rx sent

## 2021-05-25 NOTE — Telephone Encounter (Signed)
Rigoberto Noel, MD  Elton Sin, LPN; P Lb[facility] Triage Pool Caller: Unspecified (4 days ago,  8:47 AM) Prednisone 10 mg tabs  Take 2 tabs daily with food x 5ds, then 1 tab daily with food x 5ds then STOP  Offer OV if needed with APP   Tried calling the pt and there was no answer  LMTCB and rx pending

## 2021-06-03 ENCOUNTER — Other Ambulatory Visit: Payer: Self-pay

## 2021-06-03 ENCOUNTER — Ambulatory Visit (INDEPENDENT_AMBULATORY_CARE_PROVIDER_SITE_OTHER)
Admission: RE | Admit: 2021-06-03 | Discharge: 2021-06-03 | Disposition: A | Discharge status: Home / Self Care | Payer: Medicare Other | Source: Ambulatory Visit | Attending: Pulmonary Disease | Admitting: Pulmonary Disease

## 2021-06-03 DIAGNOSIS — R911 Solitary pulmonary nodule: Secondary | ICD-10-CM

## 2021-06-03 DIAGNOSIS — J9611 Chronic respiratory failure with hypoxia: Secondary | ICD-10-CM

## 2021-06-03 DIAGNOSIS — J441 Chronic obstructive pulmonary disease with (acute) exacerbation: Secondary | ICD-10-CM

## 2021-06-04 NOTE — Progress Notes (Signed)
Spoke with pt and notified of results per Dr. Alva. Pt verbalized understanding and denied any questions. 

## 2021-06-12 IMAGING — CT CT CHEST W/O CM
2 of 4 series · 15 of 36 positions shown, 18 images · non-contrast
Comparison: Chest radiograph 06/04/2020

CLINICAL DATA: Follow-up lung nodule.

EXAM:
CT CHEST WITHOUT CONTRAST
TECHNIQUE: Multidetector CT imaging of the chest was performed following the
standard protocol without IV contrast.

[Series 4: thorax 2.0 · axial · 0.78mm/px · z∈[+983,+1317]mm · 12 of 187 slices shown, 15 images]
[im 10/187  mediastinal]
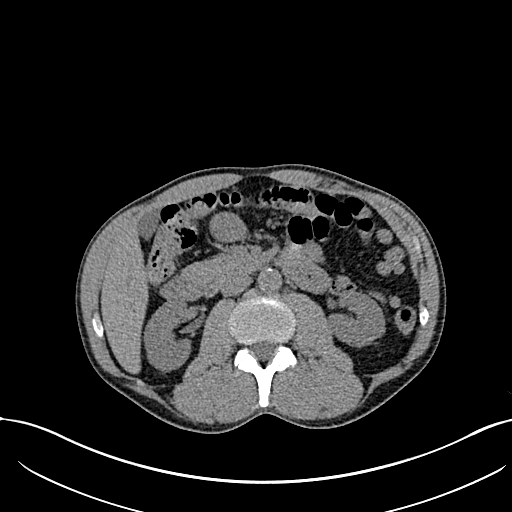
[im 10/187  lung]
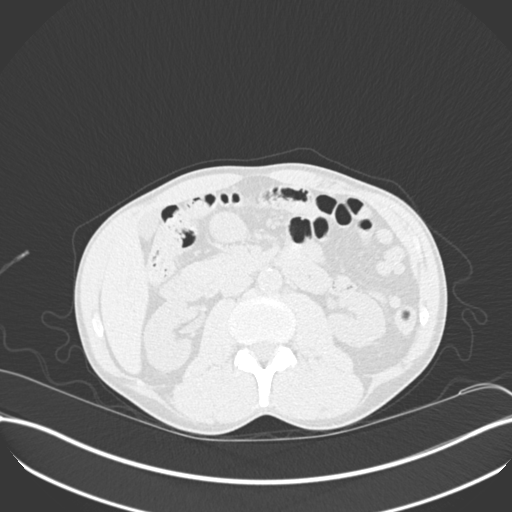
[im 30/187  lung]
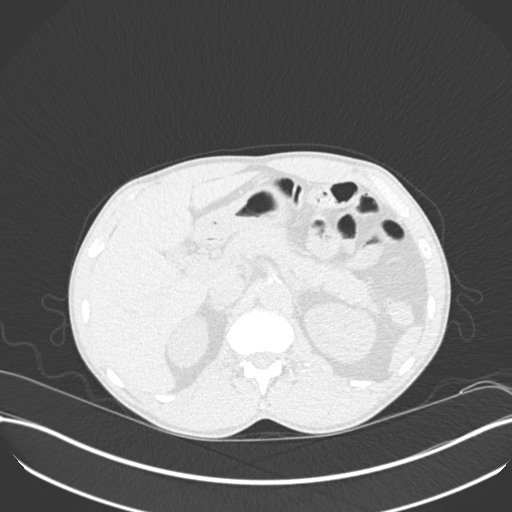
[im 40/187  lung]
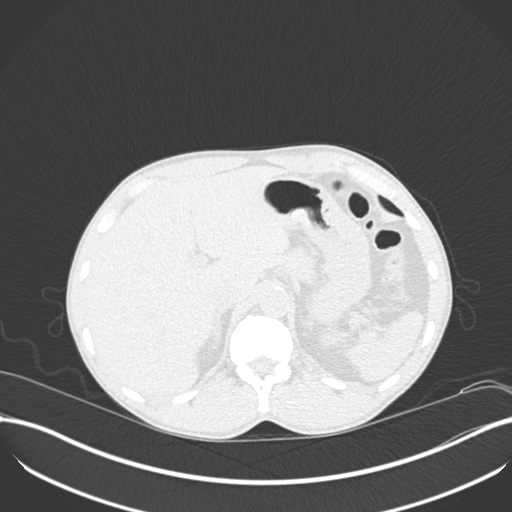
[im 59/187  lung]
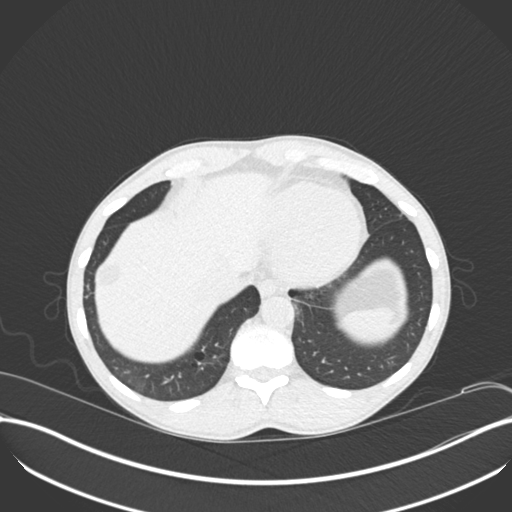
[im 69/187  mediastinal]
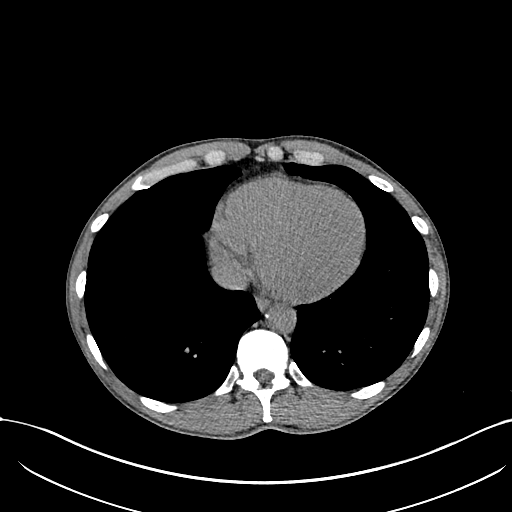
[im 69/187  lung]
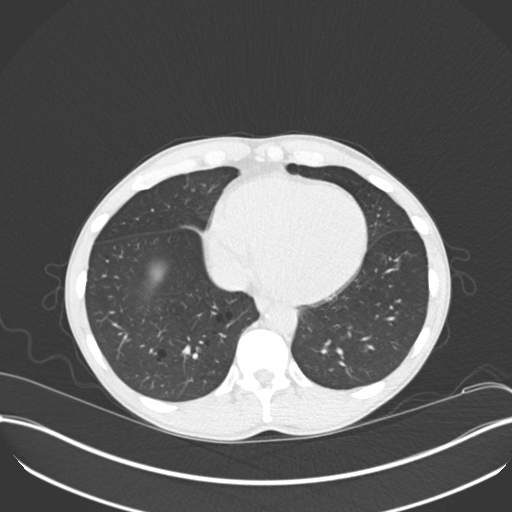
[im 89/187  lung]
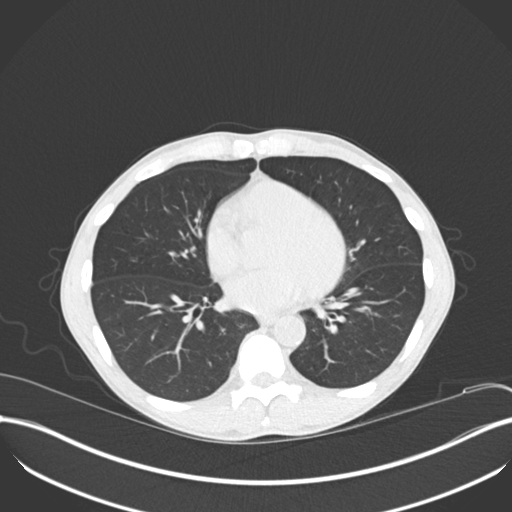
[im 98/187  lung]
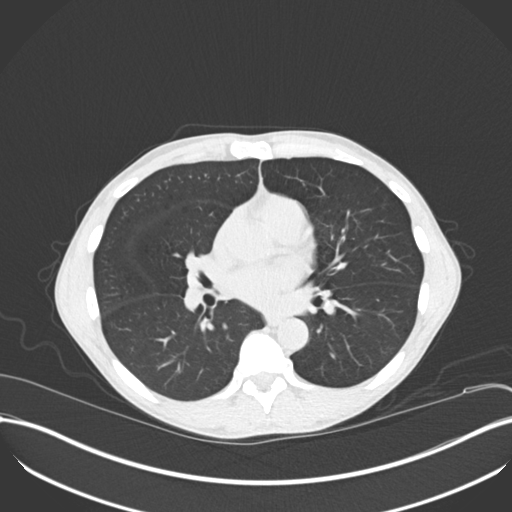
[im 118/187  lung]
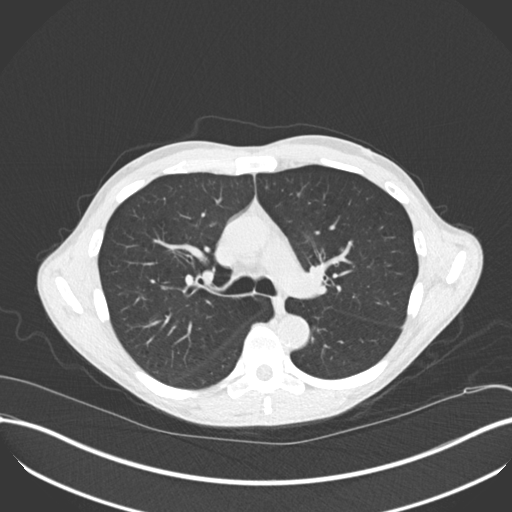
[im 128/187  mediastinal]
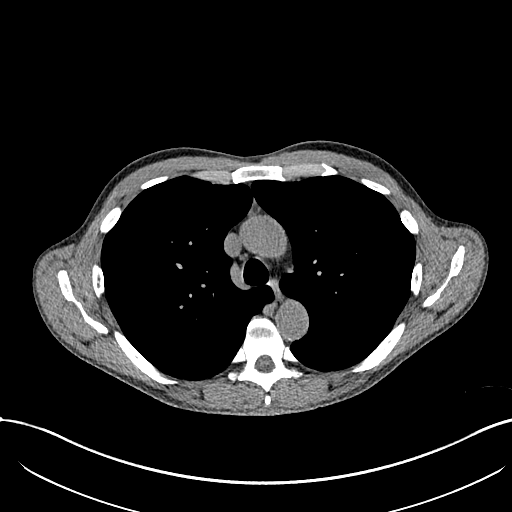
[im 128/187  lung]
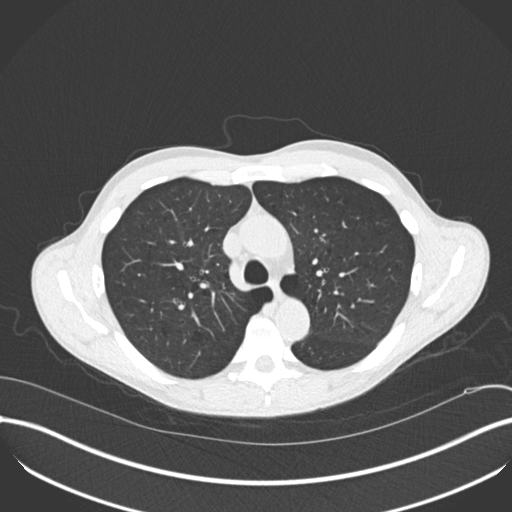
[im 147/187  lung]
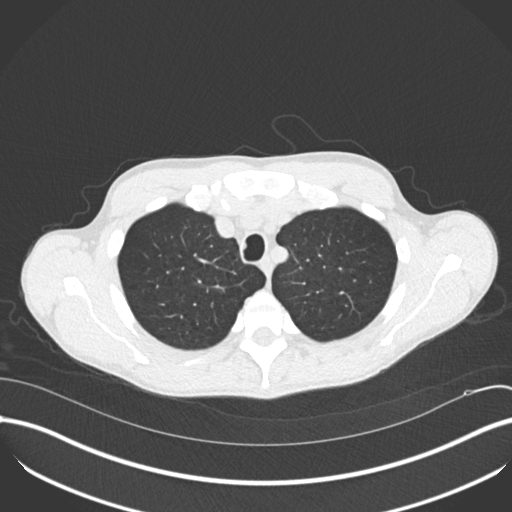
[im 157/187  lung]
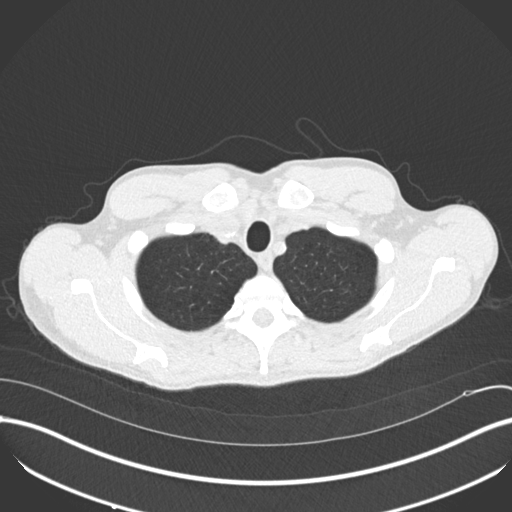
[im 177/187  lung]
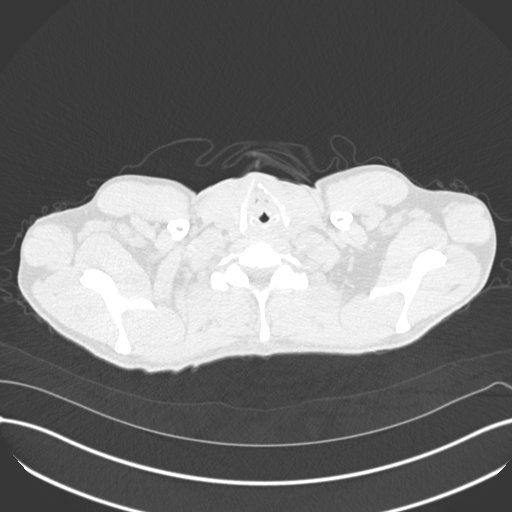

[Series 7: coronal · coronal · 0.74mm/px · 3 of 105 slices shown]
[im 21/105  lung]
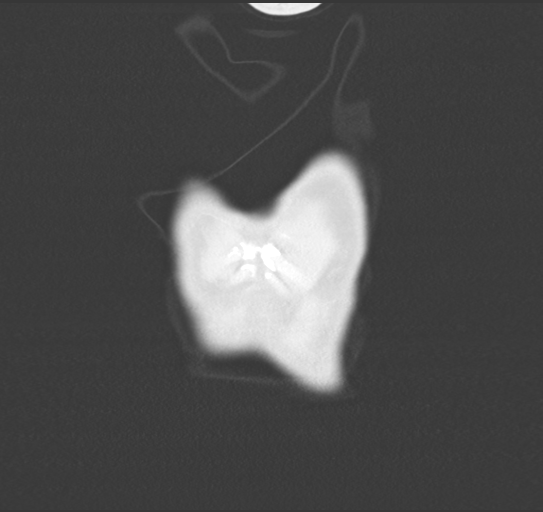
[im 42/105  lung]
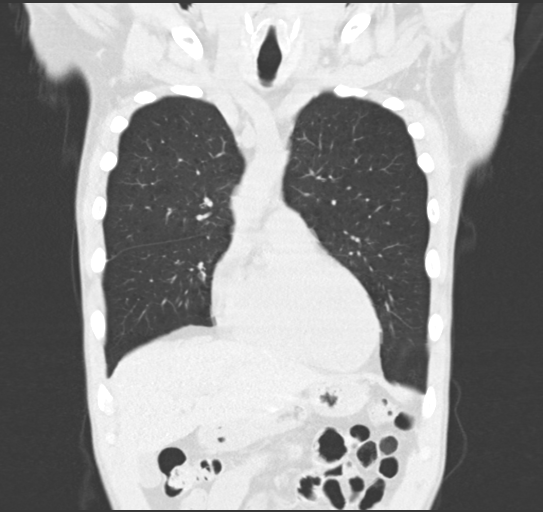
[im 63/105  lung]
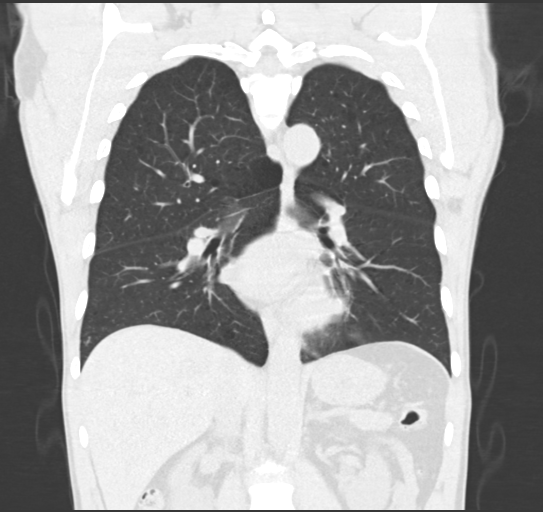

[15 of 36 positions shown; findings below may reference images not displayed]

FINDINGS: Cardiovascular: Normal heart size. Normal caliber of the thoracic
aorta. Aortic atherosclerotic calcification. Normal caliber of the
main pulmonary artery. No pericardial effusion.

Mediastinum/Nodes: No enlarged mediastinal or axillary lymph nodes.
Thyroid gland, trachea, and esophagus demonstrate no significant
findings.

Lungs/Pleura: Small amount of debris in the upper trachea. Moderate
centrilobular and paraseptal emphysema. No focal consolidation. No
pneumothorax or pleural effusion. There is a 1.0 cm nodular focus in
the medial subpleural right lung apex which may represent nodular
scarring (series 6, image 25). There is a 2 mm nodule in the right
lower lobe (series 6, image 97). No nodule is visualized in the left
lung.

Upper Abdomen: Multiple hepatic cysts. Colonic diverticula.
Otherwise unremarkable.

Musculoskeletal: No chest wall mass or suspicious bone lesions
identified.
IMPRESSION: 1. Moderate centrilobular and paraseptal emphysema.
2. 1.0 cm nodular focus in the right lung apex is indeterminate and
may represent nodular scarring. Consider short-term follow-up CT in
3 months versus PET-CT.
3. No nodule identified in the left lung apex (the previous CT where
this nodule was identified is not available for comparison). There
is a 2 mm nodule in the right lower lobe.

Aortic Atherosclerosis (9AOWV-470.0) and Emphysema (9AOWV-2SJ.C).

## 2021-06-16 ENCOUNTER — Other Ambulatory Visit (HOSPITAL_COMMUNITY): Payer: Self-pay

## 2021-06-30 ENCOUNTER — Telehealth: Payer: Self-pay

## 2021-06-30 NOTE — Telephone Encounter (Signed)
LVM to have pt call back to schedule AWV.   RE: confirm insurance and schedule AWV on my schedule if times are convenient for patient or other AWV schedule as template permits.   

## 2021-07-06 ENCOUNTER — Other Ambulatory Visit: Payer: Self-pay

## 2021-07-06 DIAGNOSIS — J449 Chronic obstructive pulmonary disease, unspecified: Secondary | ICD-10-CM

## 2021-07-06 MED ORDER — ALBUTEROL SULFATE HFA 108 (90 BASE) MCG/ACT IN AERS: INHALATION_SPRAY | RESPIRATORY_TRACT | 1 refills | Status: DC

## 2021-07-10 ENCOUNTER — Encounter (HOSPITAL_COMMUNITY): Payer: Self-pay

## 2021-07-10 ENCOUNTER — Ambulatory Visit (HOSPITAL_COMMUNITY)
Admission: EM | Admit: 2021-07-10 | Discharge: 2021-07-10 | Disposition: A | Discharge status: Home / Self Care | Payer: Medicare Other | Attending: Emergency Medicine | Admitting: Emergency Medicine

## 2021-07-10 ENCOUNTER — Other Ambulatory Visit: Payer: Self-pay

## 2021-07-10 DIAGNOSIS — J441 Chronic obstructive pulmonary disease with (acute) exacerbation: Secondary | ICD-10-CM

## 2021-07-10 DIAGNOSIS — R0602 Shortness of breath: Secondary | ICD-10-CM

## 2021-07-10 MED ORDER — AZITHROMYCIN 250 MG PO TABS: 250.0000 mg | ORAL_TABLET | Freq: Every day | ORAL | 0 refills | Status: DC

## 2021-07-10 MED ORDER — PREDNISONE 10 MG (21) PO TBPK: ORAL_TABLET | Freq: Every day | ORAL | 0 refills | Status: DC

## 2021-07-10 NOTE — ED Provider Notes (Signed)
[facility]    CSN: [id number] Arrival date & time: 07/10/21  1254      History   Chief Complaint Chief Complaint  Patient presents with   Shortness of Breath    HPI [name] is a 65 y.o. male.   Patient here for evaluation of shortness of breath and possible COPD exacerbation that has been ongoing for the past 6 days.  Reports history of same in the past required antibiotics and steroids.  Reports having to increase home oxygen use over the past 6 days and using inhaler and nebulizer as prescribed.  Reports increased wheezing and shortness of breath especially on exertion.  Denies any recent sick contacts.  Denies any trauma, injury, or other precipitating event.  Denies any fevers, chest pain, shortness of breath, N/V/D, numbness, tingling, weakness, abdominal pain, or headaches.    The history is provided by the patient.  Shortness of Breath Associated symptoms: wheezing   Associated symptoms: no chest pain    Past Medical History:  Diagnosis Date   Asthma    COPD (chronic obstructive pulmonary disease) (HCC)    Hypertension    Shortness of breath     Patient Active Problem List   Diagnosis Date Noted   Chronic respiratory failure with hypoxia ([place]) 04/23/2021   AKI (acute kidney injury) ([place]) 04/05/2021   COPD exacerbation ([place]) 03/27/2021   Pulmonary nodule 1 cm or greater in diameter 03/19/2019   Primary hypertension    History of tobacco use 02/06/2014   Erectile dysfunction 12/31/2008   COPD (chronic obstructive pulmonary disease) ([place]) 12/31/2008   History of colonic polyps 01/18/2007    Past Surgical History:  Procedure Laterality Date   COLONOSCOPY         Home Medications    Prior to Admission medications   Medication Sig Start Date End Date Taking? Authorizing Provider  azithromycin (ZITHROMAX) 250 MG tablet Take 1 tablet (250 mg total) by mouth daily. Take first 2 tablets together, then 1 every day until finished. 07/10/21  Yes  Pearson Forster, NP  predniSONE (STERAPRED UNI-PAK 21 TAB) 10 MG (21) TBPK tablet Take by mouth daily. Take 6 tabs by mouth daily  for 2 days, then 5 tabs for 2 days, then 4 tabs for 2 days, then 3 tabs for 2 days, 2 tabs for 2 days, then 1 tab by mouth daily for 2 days 07/10/21  Yes Pearson Forster, NP  acetaminophen (TYLENOL) 500 MG tablet Take 1,000 mg by mouth as needed for mild pain.    [provider]  albuterol (VENTOLIN HFA) 108 (90 Base) MCG/ACT inhaler INHALE 2 PUFFS EVERY 6 HOURS AS NEEDED FOR WHEEZING OR SHORTNESS OF BREATH 07/06/21   [name], MD  amLODipine (NORVASC) 10 MG tablet TAKE 1 TABLET BY MOUTH EVERY DAY Patient taking differently: Take 10 mg by mouth daily. 12/14/20   Mullis, Archie Endo, MD  chlorthalidone (HYGROTON) 25 MG tablet Take 1 tablet (25 mg total) by mouth daily. 01/11/21   Mullis, Archie Endo, MD  Fluticasone-Umeclidin-Vilant (TRELEGY ELLIPTA) 100-62.5-25 MCG/INH AEPB Inhale 1 application into the lungs daily. Patient taking differently: Inhale 1 puff into the lungs daily. 09/30/20   Mullis, Archie Endo, MD  ipratropium-albuterol (DUONEB) 0.5-2.5 (3) MG/3ML SOLN INHALE 1 VIAL VIA NEBULIZER EVERY 6 HOURS AS NEEDED FOR SHORTNESS OF BREATH /  WHEEZING Patient taking differently: Inhale 3 mLs into the lungs every 6 (six) hours as needed (shortness of breath/wheezing). 01/25/21   Mina Marble  P, MD  promethazine-dextromethorphan (PROMETHAZINE-DM) 6.25-15 MG/5ML syrup Take 5 mLs by mouth at bedtime as needed for cough. 03/06/21   Jaynee Eagles, PA-C    Family History Family History  Problem Relation Age of Onset   Cancer Father     Social History Social History   Tobacco Use   Smoking status: Some Days    Packs/day: 0.50    Years: 30.00    Pack years: 15.00    Types: Cigarettes    Last attempt to quit: 08/25/2020    Years since quitting: 0.8   Smokeless tobacco: Never   Tobacco comments:    2 cigs smoked a few days a week 04/23/21 [facility]   Vaping Use    Vaping Use: Never used  Substance Use Topics   Alcohol use: Yes    Comment: rare/occasional   Drug use: Not Currently     Allergies   Lisinopril and Spiriva respimat [tiotropium bromide monohydrate]   Review of Systems Review of Systems  Respiratory:  Positive for shortness of breath and wheezing. Negative for chest tightness.   Cardiovascular:  Negative for chest pain.  All other systems reviewed and are negative.   Physical Exam Triage Vital Signs ED Triage Vitals  Enc Vitals Group     BP 07/10/21 1431 (!) 176/81     Pulse Rate 07/10/21 1304 76     Resp 07/10/21 1431 16     Temp 07/10/21 1431 97.8 F (36.6 C)     Temp Source 07/10/21 1431 Oral     SpO2 07/10/21 1304 96 %     Weight --      Height --      Head Circumference --      Peak Flow --      Pain Score 07/10/21 1432 0     Pain Loc --      Pain Edu? --      Excl. in [place]? --    No data found.  Updated Vital Signs BP (!) 176/81 (BP Location: Right Arm)   Pulse 64   Temp 97.8 F (36.6 C) (Oral)   Resp 16   SpO2 99%   Visual Acuity Right Eye Distance:   Left Eye Distance:   Bilateral Distance:    Right Eye Near:   Left Eye Near:    Bilateral Near:     Physical Exam Vitals and nursing note reviewed.  Constitutional:      General: He is not in acute distress.    Appearance: Normal appearance. He is not ill-appearing, toxic-appearing or diaphoretic.  HENT:     Head: Normocephalic and atraumatic.  Eyes:     Conjunctiva/sclera: Conjunctivae normal.  Cardiovascular:     Rate and Rhythm: Normal rate.     Pulses: Normal pulses.     Heart sounds: Normal heart sounds.  Pulmonary:     Effort: Pulmonary effort is normal.     Breath sounds: Examination of the right-upper field reveals wheezing. Examination of the left-upper field reveals wheezing. Examination of the right-middle field reveals wheezing. Examination of the right-lower field reveals wheezing. Examination of the left-lower field reveals  wheezing. Wheezing present.  Chest:     Chest wall: No mass, deformity, tenderness, crepitus or edema. There is no dullness to percussion.  Abdominal:     General: Abdomen is flat.  Musculoskeletal:        General: Normal range of motion.     Cervical back: Normal range of motion.  Skin:  General: Skin is warm and dry.  Neurological:     General: No focal deficit present.     Mental Status: He is alert and oriented to person, place, and time.  Psychiatric:        Mood and Affect: Mood normal.     UC Treatments / Results  Labs (all labs ordered are listed, but only abnormal results are displayed) Labs Reviewed - No data to display  EKG   Radiology No results found.  Procedures Procedures (including critical care time)  Medications Ordered in UC Medications - No data to display  Initial Impression / Assessment and Plan / UC Course  I have reviewed the triage vital signs and the nursing notes.  Pertinent labs & imaging results that were available during my care of the patient were reviewed by me and considered in my medical decision making (see chart for details).    Assessment negative for red flags or concerns.  Likely COPD exacerbation with shortness of breath.  Will treat with azithromycin and prednisone taper.  Continue to use home medications as previously prescribed.  Strict ED follow-up for any worsening or red flag symptoms.  Follow-up with primary care for reevaluation. Final Clinical Impressions(s) / UC Diagnoses   Final diagnoses:  COPD exacerbation ([place])  Shortness of breath     Discharge Instructions      Take the prednisone as prescribed.  Take the azithromycin two pills today and then one pill daily until they are gone.    Continue to use your inhaler and nebulizer as prescribed.   If your shortness of breath gets worse, go to the Emergency Department for further evaluation.   Follow up with your primary care provider as soon as possible for  re-evaluation.       ED Prescriptions     Medication Sig Dispense Auth. Provider   azithromycin (ZITHROMAX) 250 MG tablet Take 1 tablet (250 mg total) by mouth daily. Take first 2 tablets together, then 1 every day until finished. 6 tablet Pearson Forster, NP   predniSONE (STERAPRED UNI-PAK 21 TAB) 10 MG (21) TBPK tablet Take by mouth daily. Take 6 tabs by mouth daily  for 2 days, then 5 tabs for 2 days, then 4 tabs for 2 days, then 3 tabs for 2 days, 2 tabs for 2 days, then 1 tab by mouth daily for 2 days 42 tablet Pearson Forster, NP      PDMP not reviewed this encounter.   Pearson Forster, NP 07/10/21 1520

## 2021-07-10 NOTE — ED Triage Notes (Signed)
Pt presents with shortness of breath that is chronic from COPD but has increased more than usual over past 6 days.

## 2021-07-10 NOTE — Discharge Instructions (Addendum)
Take the prednisone as prescribed.  Take the azithromycin two pills today and then one pill daily until they are gone.    Continue to use your inhaler and nebulizer as prescribed.   If your shortness of breath gets worse, go to the Emergency Department for further evaluation.   Follow up with your primary care provider as soon as possible for re-evaluation.

## 2021-07-20 ENCOUNTER — Other Ambulatory Visit: Payer: Self-pay | Admitting: Family Medicine

## 2021-07-20 DIAGNOSIS — J449 Chronic obstructive pulmonary disease, unspecified: Secondary | ICD-10-CM

## 2021-08-03 ENCOUNTER — Telehealth: Payer: Self-pay | Admitting: Emergency Medicine

## 2021-08-03 MED ORDER — PREDNISONE 10 MG PO TABS: ORAL_TABLET | ORAL | 0 refills | Status: DC

## 2021-08-03 NOTE — Telephone Encounter (Signed)
Increased SOB, using 2L of O2 at night, "COPD flare", pt denies F/C/N/V/D. Pt is requesting prednisone. Pt states using rescue inhaler and ned as ordered along with Trelegy as directed. Dr. [name] could you please advise.

## 2021-08-03 NOTE — Telephone Encounter (Signed)
Called and spoke with Jonnie Kind. She verbalized understanding. Prednisone has been ordered.   Nothing further needed at time of call.

## 2021-08-03 NOTE — Telephone Encounter (Signed)
ATC LVMTCB x 1  

## 2021-08-20 ENCOUNTER — Ambulatory Visit (HOSPITAL_COMMUNITY)
Admission: EM | Admit: 2021-08-20 | Discharge: 2021-08-20 | Disposition: A | Discharge status: Home / Self Care | Payer: Medicare Other | Attending: Emergency Medicine | Admitting: Emergency Medicine

## 2021-08-20 ENCOUNTER — Other Ambulatory Visit: Payer: Self-pay

## 2021-08-20 ENCOUNTER — Encounter (HOSPITAL_COMMUNITY): Payer: Self-pay

## 2021-08-20 DIAGNOSIS — J441 Chronic obstructive pulmonary disease with (acute) exacerbation: Secondary | ICD-10-CM

## 2021-08-20 MED ORDER — AZITHROMYCIN 250 MG PO TABS: 250.0000 mg | ORAL_TABLET | Freq: Every day | ORAL | 0 refills | Status: DC

## 2021-08-20 MED ORDER — PREDNISONE 10 MG (21) PO TBPK: ORAL_TABLET | Freq: Every day | ORAL | 0 refills | Status: DC

## 2021-08-20 NOTE — Discharge Instructions (Signed)
Take the azithromycin 2 pills today and then 1 daily until finished. Take the prednisone as prescribed.  Continue your inhaler and nebulizer as needed for shortness of breath and wheezing.  Follow-up with your primary care provider and pulmonologist as soon as possible for reevaluation of frequent COPD exacerbations.  If you develop any worsening shortness of breath, difficulty breathing, chest pain, or headaches please call 911 or go to the emergency room.

## 2021-08-20 NOTE — ED Triage Notes (Signed)
Pt presents with progressing of shortness of breath X 1 week; Pt has COPD  Pt is on 2 lt of oxygen

## 2021-08-20 NOTE — ED Provider Notes (Signed)
[place]    CSN: [id number] Arrival date & time: 08/20/21  0841      History   Chief Complaint Chief Complaint  Patient presents with   Shortness of Breath    HPI [name] is a 65 y.o. male.   Patient here for evaluation of worsening shortness of breath that has been ongoing for the past week.  Reports history of COPD with frequent exacerbations.  Reports having to use 2 L of oxygen all the time, typically only uses it as needed.  Reports shortness of breath is worse on exertion.  Patient has been seen here for the same multiple times in the last few months.  Reports appointment with pulmonary at the end of October.  Denies any trauma, injury, or other precipitating event.  Denies any fevers, chest pain, N/V/D, numbness, tingling, weakness, abdominal pain, or headaches.    The history is provided by the patient.  Shortness of Breath Associated symptoms: no cough and no fever    Past Medical History:  Diagnosis Date   Asthma    COPD (chronic obstructive pulmonary disease) (HCC)    Hypertension    Shortness of breath     Patient Active Problem List   Diagnosis Date Noted   Chronic respiratory failure with hypoxia ([place]) 04/23/2021   AKI (acute kidney injury) ([place]) 04/05/2021   COPD exacerbation ([place]) 03/27/2021   Pulmonary nodule 1 cm or greater in diameter 03/19/2019   Primary hypertension    History of tobacco use 02/06/2014   Erectile dysfunction 12/31/2008   COPD (chronic obstructive pulmonary disease) ([place]) 12/31/2008   History of colonic polyps 01/18/2007    Past Surgical History:  Procedure Laterality Date   COLONOSCOPY         Home Medications    Prior to Admission medications   Medication Sig Start Date End Date Taking? Authorizing Provider  predniSONE (STERAPRED UNI-PAK 21 TAB) 10 MG (21) TBPK tablet Take by mouth daily. Take 6 tabs by mouth daily  for 2 days, then 5 tabs for 2 days, then 4 tabs for 2 days, then 3 tabs for 2 days,  2 tabs for 2 days, then 1 tab by mouth daily for 2 days 08/20/21  Yes Pearson Forster, NP  acetaminophen (TYLENOL) 500 MG tablet Take 1,000 mg by mouth as needed for mild pain.    [provider]  albuterol (VENTOLIN HFA) 108 (90 Base) MCG/ACT inhaler INHALE 2 PUFFS EVERY 6 HOURS AS NEEDED FOR WHEEZING OR SHORTNESS OF BREATH 07/06/21   [name], MD  amLODipine (NORVASC) 10 MG tablet TAKE 1 TABLET BY MOUTH EVERY DAY Patient taking differently: Take 10 mg by mouth daily. 12/14/20   Mullis, Kiersten P, DO  azithromycin (ZITHROMAX) 250 MG tablet Take 1 tablet (250 mg total) by mouth daily. Take first 2 tablets together, then 1 every day until finished. 08/20/21   Pearson Forster, NP  chlorthalidone (HYGROTON) 25 MG tablet Take 1 tablet (25 mg total) by mouth daily. 01/11/21   Mullis, Kiersten P, DO  Fluticasone-Umeclidin-Vilant (TRELEGY ELLIPTA) 100-62.5-25 MCG/INH AEPB Inhale 1 application into the lungs daily. Patient taking differently: Inhale 1 puff into the lungs daily. 09/30/20   Mullis, Kiersten P, DO  ipratropium-albuterol (DUONEB) 0.5-2.5 (3) MG/3ML SOLN INHALE 1 VIAL VIA NEBULIZER EVERY 6 HOURS AS NEEDED FOR SHORTNESS OF BREATH /  WHEEZING Patient taking differently: Inhale 3 mLs into the lungs every 6 (six) hours as needed (shortness of breath/wheezing). 01/25/21  Mullis, Kiersten P, DO  promethazine-dextromethorphan (PROMETHAZINE-DM) 6.25-15 MG/5ML syrup Take 5 mLs by mouth at bedtime as needed for cough. 03/06/21   Jaynee Eagles, PA-C    Family History Family History  Problem Relation Age of Onset   Cancer Father     Social History Social History   Tobacco Use   Smoking status: Some Days    Packs/day: 0.50    Years: 30.00    Pack years: 15.00    Types: Cigarettes    Last attempt to quit: 08/25/2020    Years since quitting: 0.9   Smokeless tobacco: Never   Tobacco comments:    2 cigs smoked a few days a week 04/23/21 [facility]   Vaping Use   Vaping Use: Never used  Substance  Use Topics   Alcohol use: Yes    Comment: rare/occasional   Drug use: Not Currently     Allergies   Lisinopril and Spiriva respimat [tiotropium bromide monohydrate]   Review of Systems Review of Systems  Constitutional:  Negative for fever.  HENT:  Negative for congestion.   Respiratory:  Positive for shortness of breath. Negative for cough.   All other systems reviewed and are negative.   Physical Exam Triage Vital Signs ED Triage Vitals  Enc Vitals Group     BP 08/20/21 0900 (!) 163/92     Pulse Rate 08/20/21 0900 88     Resp 08/20/21 0900 18     Temp 08/20/21 0900 98.2 F (36.8 C)     Temp Source 08/20/21 0900 Oral     SpO2 08/20/21 0900 100 %     Weight --      Height --      Head Circumference --      Peak Flow --      Pain Score 08/20/21 0858 0     Pain Loc --      Pain Edu? --      Excl. in [place]? --    No data found.  Updated Vital Signs BP (!) 163/92 (BP Location: Left Arm)   Pulse 88   Temp 98.2 F (36.8 C) (Oral)   Resp 18   SpO2 100%   Visual Acuity Right Eye Distance:   Left Eye Distance:   Bilateral Distance:    Right Eye Near:   Left Eye Near:    Bilateral Near:     Physical Exam Vitals and nursing note reviewed.  Constitutional:      General: He is not in acute distress.    Appearance: Normal appearance. He is not ill-appearing, toxic-appearing or diaphoretic.  HENT:     Head: Normocephalic and atraumatic.  Eyes:     Conjunctiva/sclera: Conjunctivae normal.  Cardiovascular:     Rate and Rhythm: Normal rate and regular rhythm.     Pulses: Normal pulses.     Heart sounds: Normal heart sounds.  Pulmonary:     Effort: Pulmonary effort is normal.     Breath sounds: Examination of the right-upper field reveals decreased breath sounds. Examination of the left-upper field reveals decreased breath sounds. Decreased breath sounds present. No wheezing, rhonchi or rales.  Abdominal:     General: Abdomen is flat.     Palpations: Abdomen is  soft.  Musculoskeletal:        General: Normal range of motion.     Cervical back: Normal range of motion.  Skin:    General: Skin is warm and dry.  Neurological:     General: No  focal deficit present.     Mental Status: He is alert and oriented to person, place, and time.  Psychiatric:        Mood and Affect: Mood normal.     UC Treatments / Results  Labs (all labs ordered are listed, but only abnormal results are displayed) Labs Reviewed - No data to display  EKG   Radiology No results found.  Procedures Procedures (including critical care time)  Medications Ordered in UC Medications - No data to display  Initial Impression / Assessment and Plan / UC Course  I have reviewed the triage vital signs and the nursing notes.  Pertinent labs & imaging results that were available during my care of the patient were reviewed by me and considered in my medical decision making (see chart for details).    Assessment negative for red flags or concerns.  Likely COPD exacerbation.  Will treat with azithromycin and prednisone as prescribed.  Continue inhaler and nebulizer as needed.  Follow-up with primary care and pulmonologist as scheduled.  Strict ED follow-up for any worsening symptoms. Final Clinical Impressions(s) / UC Diagnoses   Final diagnoses:  COPD exacerbation ([place])     Discharge Instructions      Take the azithromycin 2 pills today and then 1 daily until finished. Take the prednisone as prescribed.  Continue your inhaler and nebulizer as needed for shortness of breath and wheezing.  Follow-up with your primary care provider and pulmonologist as soon as possible for reevaluation of frequent COPD exacerbations.  If you develop any worsening shortness of breath, difficulty breathing, chest pain, or headaches please call 911 or go to the emergency room.     ED Prescriptions     Medication Sig Dispense Auth. Provider   azithromycin (ZITHROMAX) 250 MG tablet  Take 1 tablet (250 mg total) by mouth daily. Take first 2 tablets together, then 1 every day until finished. 6 tablet Pearson Forster, NP   predniSONE (STERAPRED UNI-PAK 21 TAB) 10 MG (21) TBPK tablet Take by mouth daily. Take 6 tabs by mouth daily  for 2 days, then 5 tabs for 2 days, then 4 tabs for 2 days, then 3 tabs for 2 days, 2 tabs for 2 days, then 1 tab by mouth daily for 2 days 42 tablet Pearson Forster, NP      PDMP not reviewed this encounter.   Pearson Forster, NP 08/20/21 [phone]

## 2021-09-14 ENCOUNTER — Other Ambulatory Visit: Payer: Self-pay

## 2021-09-14 ENCOUNTER — Ambulatory Visit (INDEPENDENT_AMBULATORY_CARE_PROVIDER_SITE_OTHER): Payer: Medicare Other | Admitting: Pulmonary Disease

## 2021-09-14 ENCOUNTER — Encounter: Payer: Self-pay | Admitting: Pulmonary Disease

## 2021-09-14 VITALS — BP 150/90 | HR 100 | Temp 97.5°F | Ht 69.0 in | Wt 145.0 lb

## 2021-09-14 DIAGNOSIS — Z23 Encounter for immunization: Secondary | ICD-10-CM

## 2021-09-14 DIAGNOSIS — R911 Solitary pulmonary nodule: Secondary | ICD-10-CM

## 2021-09-14 DIAGNOSIS — J9611 Chronic respiratory failure with hypoxia: Secondary | ICD-10-CM | POA: Diagnosis not present

## 2021-09-14 DIAGNOSIS — J441 Chronic obstructive pulmonary disease with (acute) exacerbation: Secondary | ICD-10-CM | POA: Diagnosis not present

## 2021-09-14 MED ORDER — PREDNISONE 10 MG PO TABS: ORAL_TABLET | ORAL | 0 refills | Status: DC

## 2021-09-14 NOTE — Progress Notes (Signed)
   Subjective:    Patient ID: [name], male    DOB: 05-07-1956, 65 y.o.   MRN: [id number]  HPI  65 year old smoker for FU of COPD due to repeated exacerbations   He smoked about 2 packs/week starting as a teenager more than 30 pack years.  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  He also drinks 6-8 beers per week.  03/2021, he was hospitalized for 2 days for COPD exacerbation triggered by URI symptoms.  He required transient BiPAP, on discharge his oxygen saturation was 83% on ambulation and he was discharged on home oxygen  27-month follow-up visit 07/2021 flare >> pred taper  He complains of increased shortness of breath, has finally quit smoking. Blood pressure slight high today. He denies sputum production but reports intermittent wheezing  For some reason PFTs were not performed, we reviewed low-dose CT chest today   Significant tests/ events reviewed  LDCT 05/2021 Interval decrease in size of the medial right apical nodule with a more linear/platelike   LDCT 11/2020 >> Centrilobular emphsyema .Stable 10 mm nodular opacity in the medial right lung apex with adjacent subpleural changes. This is likely scarring  - compared to 08/2020    Spirometry 01/2014 ratio 72, FEV1 1.97/68%, FVC 76%, no bronchodilator response  Review of Systems neg for any significant sore throat, dysphagia, itching, sneezing, nasal congestion or excess/ purulent secretions, fever, chills, sweats, unintended wt loss, pleuritic or exertional cp, hempoptysis, orthopnea pnd or change in chronic leg swelling. Also denies presyncope, palpitations, heartburn, abdominal pain, nausea, vomiting, diarrhea or change in bowel or urinary habits, dysuria,hematuria, rash, arthralgias, visual complaints, headache, numbness weakness or ataxia.     Objective:   Physical Exam  Gen. Pleasant, well-nourished, in no distress ENT - no thrush, no pallor/icterus,no post nasal  drip Neck: No JVD, no thyromegaly, no carotid bruits Lungs: no use of accessory muscles, no dullness to percussion, decreased bilateral without rales or rhonchi  Cardiovascular: Rhythm regular, heart sounds  normal, no murmurs or gallops, no peripheral edema Musculoskeletal: No deformities, no cyanosis or clubbing         Assessment & Plan:

## 2021-09-14 NOTE — Assessment & Plan Note (Signed)
We will treat him as an exacerbation but likely due to seasonal allergies rather than infective, with prednisone starting at 40 mg and tapering over 2 weeks. We will continue on Trelegy

## 2021-09-14 NOTE — Addendum Note (Signed)
Addended by: Kara Mead V on: 09/14/2021 05:00 PM   Modules accepted: Level of Service

## 2021-09-14 NOTE — Patient Instructions (Addendum)
Prednisone 10 mg tabs Take 4 tabs  daily with food x 4 days, then 3 tabs daily x 4 days, then 2 tabs daily x 4 days, then 1 tab daily x4 days then stop. #40   Flu shot  Reschedule pFTs

## 2021-09-14 NOTE — Assessment & Plan Note (Addendum)
He will continue to use oxygen during sleep and exertion as needed

## 2021-09-14 NOTE — Assessment & Plan Note (Signed)
Nodule has decreased in size and is more platelike and appears to be scarring rather than malignancy. Will resume annual low-dose CT, next planned for 05/2022

## 2021-09-23 ENCOUNTER — Telehealth: Payer: Self-pay | Admitting: Pulmonary Disease

## 2021-09-23 NOTE — Telephone Encounter (Signed)
LVMTCB to schedule PFT per Dr. [name]. Patient is scheduled for a follow up on 11/26/2021. Needs PFT before this appointment.

## 2021-09-28 NOTE — Telephone Encounter (Signed)
Patient is scheduled for 12/12 at 12pm for PFT

## 2021-10-18 ENCOUNTER — Ambulatory Visit (INDEPENDENT_AMBULATORY_CARE_PROVIDER_SITE_OTHER): Payer: Medicare Other | Admitting: Family Medicine

## 2021-10-18 ENCOUNTER — Other Ambulatory Visit: Payer: Self-pay

## 2021-10-18 ENCOUNTER — Ambulatory Visit (INDEPENDENT_AMBULATORY_CARE_PROVIDER_SITE_OTHER): Payer: Medicare Other

## 2021-10-18 VITALS — BP 160/88 | HR 80 | Ht 69.0 in | Wt 148.8 lb

## 2021-10-18 DIAGNOSIS — J449 Chronic obstructive pulmonary disease, unspecified: Secondary | ICD-10-CM | POA: Diagnosis not present

## 2021-10-18 DIAGNOSIS — Z23 Encounter for immunization: Secondary | ICD-10-CM | POA: Diagnosis not present

## 2021-10-18 DIAGNOSIS — I10 Essential (primary) hypertension: Secondary | ICD-10-CM

## 2021-10-18 DIAGNOSIS — D171 Benign lipomatous neoplasm of skin and subcutaneous tissue of trunk: Secondary | ICD-10-CM

## 2021-10-18 NOTE — Patient Instructions (Addendum)
It was great to meet you!  The large bump on your shoulder area is called a "lipoma" which is a collection of fat cells. It's not dangerous, but if it's growing large or causing symptoms we often recommend removal.  I have placed a referral to general surgery for possible removal. They will contact you for an appointment.  I will send refills on your medications. Since you've been off the blood pressure medicine for a little while, we will only restart one blood pressure medication. We would like to see you for another appointment in a few weeks and we can consider restarting the other blood pressure medicine if it's still high.    Take care, Dr. Rock Nephew

## 2021-10-18 NOTE — Progress Notes (Signed)
    SUBJECTIVE:   CHIEF COMPLAINT / HPI:   R Shoulder Bump/Pain Patient noticed large mass-like bump on his right posterior shoulder about 3 months ago. For the past couple of weeks it has become painful, particularly when raising his right arm. It is not painful to the touch. He takes Tylenol as needed which is helpful. He denies injury or trauma to the area. No systemic symptoms (no fever/chills, no weight loss, night sweats, etc, no other joint issues)  Medication Refills Patient requests refills on the following medications -Albuterol inhaler -Amlodipine 10mg  -Chlorthalidone 25mg  -Trelegy inhaler Reports he has been off his blood pressure medications for the past several months because he ran out.  PERTINENT  PMH / PSH: HTN, COPD, tobacco use  OBJECTIVE:   BP (!) 160/88   Pulse 80   Ht 5\' 9"  (1.753 m)   Wt 148 lb 12.8 oz (67.5 kg)   SpO2 97%   BMI 21.97 kg/m   General: NAD, pleasant, able to participate in exam Respiratory: No respiratory distress Skin: warm and dry, no rashes noted Psych: Normal affect and mood Neuro: grossly intact  MSK: soft, moveable, nontender subcutaneous nodule/mass lateral to right posterior scapula (as shown below) without overlying skin changes. Full ROM of right shoulder. Neurovascularly intact distally.  ASSESSMENT/PLAN:   Lipoma of back Patient with large subcutaneous mass-like lesion on right posterior shoulder that has been present x3 months and now causes pain with ROM of right arm. Exam consistent with lipoma. Given symptoms and large size, will refer to general surgery for removal.  Primary hypertension Not well-controlled currently due to medication non-compliance over past several months. BP elevated to 160/88 today. He is asymptomatic. Given that he has been off medication, will restart BP meds gradually. -Refills sent on amlodipine 10mg  daily -Follow up in 4 weeks. Consider restarting chlorthalidone 25mg  daily at that time. Also  consider obtaining BMP at that time.   Med Refills Refills also sent for patient's albuterol and trelegy inhalers as requested.  Alcus Dad, MD [place]

## 2021-10-19 DIAGNOSIS — D171 Benign lipomatous neoplasm of skin and subcutaneous tissue of trunk: Secondary | ICD-10-CM | POA: Insufficient documentation

## 2021-10-19 MED ORDER — ALBUTEROL SULFATE HFA 108 (90 BASE) MCG/ACT IN AERS: INHALATION_SPRAY | RESPIRATORY_TRACT | 1 refills | Status: DC

## 2021-10-19 MED ORDER — TRELEGY ELLIPTA 100-62.5-25 MCG/ACT IN AEPB
INHALATION_SPRAY | RESPIRATORY_TRACT | 1 refills | Status: DC
Start: 2021-10-19 — End: 2022-03-02

## 2021-10-19 MED ORDER — AMLODIPINE BESYLATE 10 MG PO TABS: 10.0000 mg | ORAL_TABLET | Freq: Every day | ORAL | 3 refills | Status: DC

## 2021-10-19 NOTE — Assessment & Plan Note (Signed)
Patient with large subcutaneous mass-like lesion on right posterior shoulder that has been present x3 months and now causes pain with ROM of right arm. Exam consistent with lipoma. Given symptoms and large size, will refer to general surgery for removal.

## 2021-10-19 NOTE — Assessment & Plan Note (Signed)
Not well-controlled currently due to medication non-compliance over past several months. BP elevated to 160/88 today. He is asymptomatic. Given that he has been off medication, will restart BP meds gradually. -Refills sent on amlodipine 10mg  daily -Follow up in 4 weeks. Consider restarting chlorthalidone 25mg  daily at that time. Also consider obtaining BMP at that time.

## 2021-10-20 ENCOUNTER — Ambulatory Visit (HOSPITAL_COMMUNITY)
Admission: EM | Admit: 2021-10-20 | Discharge: 2021-10-20 | Disposition: A | Discharge status: Home / Self Care | Payer: Medicare Other | Attending: Emergency Medicine | Admitting: Emergency Medicine

## 2021-10-20 ENCOUNTER — Other Ambulatory Visit: Payer: Self-pay

## 2021-10-20 ENCOUNTER — Encounter (HOSPITAL_COMMUNITY): Payer: Self-pay | Admitting: Emergency Medicine

## 2021-10-20 DIAGNOSIS — M25511 Pain in right shoulder: Secondary | ICD-10-CM

## 2021-10-20 MED ORDER — DICLOFENAC SODIUM 1 % EX GEL
2.0000 g | Freq: Four times a day (QID) | CUTANEOUS | 0 refills | Status: DC
Start: 2021-10-20 — End: 2022-07-22

## 2021-10-20 NOTE — ED Provider Notes (Signed)
[place]    CSN: [id number] Arrival date & time: 10/20/21  0945      History   Chief Complaint Chief Complaint  Patient presents with   Shoulder Pain    HPI [name] is a 65 y.o. male. Pt reports 2 year hx of lipoma on his R upper back that is progressively enlarging and starting to bother him/cause him R shoulder pain. Saw PCP 2 days, given referral to general surgery but not given anything for pain. Pt reports he's been "eating tylenol" but it's no longer helping with his pain.    Shoulder Pain  Past Medical History:  Diagnosis Date   Asthma    COPD (chronic obstructive pulmonary disease) ([place])    Hypertension    Shortness of breath     Patient Active Problem List   Diagnosis Date Noted   Lipoma of back 10/19/2021   Chronic respiratory failure with hypoxia ([place]) 04/23/2021   AKI (acute kidney injury) ([place]) 04/05/2021   COPD exacerbation ([place]) 03/27/2021   Pulmonary nodule 1 cm or greater in diameter 03/19/2019   Primary hypertension    History of tobacco use 02/06/2014   Erectile dysfunction 12/31/2008   COPD (chronic obstructive pulmonary disease) ([place]) 12/31/2008   History of colonic polyps 01/18/2007    Past Surgical History:  Procedure Laterality Date   COLONOSCOPY         Home Medications    Prior to Admission medications   Medication Sig Start Date End Date Taking? Authorizing Provider  diclofenac Sodium (VOLTAREN) 1 % GEL Apply 2 g topically 4 (four) times daily. 10/20/21  Yes Carvel Getting, NP  acetaminophen (TYLENOL) 500 MG tablet Take 1,000 mg by mouth as needed for mild pain.    [provider]  albuterol (VENTOLIN HFA) 108 (90 Base) MCG/ACT inhaler INHALE 2 PUFFS EVERY 6 HOURS AS NEEDED FOR WHEEZING OR SHORTNESS OF BREATH 10/19/21   Alcus Dad, MD  amLODipine (NORVASC) 10 MG tablet Take 1 tablet (10 mg total) by mouth daily. 10/19/21   Alcus Dad, MD  chlorthalidone (HYGROTON) 25 MG tablet Take 1  tablet (25 mg total) by mouth daily. 01/11/21   Mullis, Kiersten P, DO  Fluticasone-Umeclidin-Vilant (TRELEGY ELLIPTA) 100-62.5-25 MCG/ACT AEPB Inhale one puff into the lungs daily 10/19/21   Alcus Dad, MD  ipratropium-albuterol (DUONEB) 0.5-2.5 (3) MG/3ML SOLN INHALE 1 VIAL VIA NEBULIZER EVERY 6 HOURS AS NEEDED FOR SHORTNESS OF BREATH /  WHEEZING Patient taking differently: Inhale 3 mLs into the lungs every 6 (six) hours as needed (shortness of breath/wheezing). 01/25/21   Mullis, Archie Endo, DO    Family History Family History  Problem Relation Age of Onset   Cancer Father     Social History Social History   Tobacco Use   Smoking status: Some Days    Packs/day: 0.50    Years: 30.00    Pack years: 15.00    Types: Cigarettes    Last attempt to quit: 08/25/2020    Years since quitting: 1.1   Smokeless tobacco: Never   Tobacco comments:    2 cigs smoked a few days a week 04/23/21 [facility]   Vaping Use   Vaping Use: Never used  Substance Use Topics   Alcohol use: Yes    Comment: rare/occasional   Drug use: Not Currently     Allergies   Lisinopril and Spiriva respimat [tiotropium bromide monohydrate]   Review of Systems Review of Systems  Musculoskeletal:  R shoulder joint pain, reduced rom  Skin:        Growing lipoma  Neurological:  Negative for weakness and numbness.    Physical Exam Triage Vital Signs ED Triage Vitals  Enc Vitals Group     BP 10/20/21 1058 (!) 171/97     Pulse Rate 10/20/21 1058 83     Resp 10/20/21 1058 17     Temp 10/20/21 1058 98.4 F (36.9 C)     Temp Source 10/20/21 1058 Oral     SpO2 10/20/21 1058 96 %     Weight --      Height --      Head Circumference --      Peak Flow --      Pain Score 10/20/21 1056 10     Pain Loc --      Pain Edu? --      Excl. in [place]? --    No data found.  Updated Vital Signs BP (!) 171/97 (BP Location: Left Arm)   Pulse 83   Temp 98.4 F (36.9 C) (Oral)   Resp 17   SpO2 96%   Visual  Acuity Right Eye Distance:   Left Eye Distance:   Bilateral Distance:    Right Eye Near:   Left Eye Near:    Bilateral Near:     Physical Exam Constitutional:      General: He is not in acute distress.    Appearance: Normal appearance.  Pulmonary:     Effort: Pulmonary effort is normal.  Musculoskeletal:     Right shoulder: Tenderness present. No swelling or bony tenderness. Decreased range of motion.     Comments: Pt keeps R shoulder in elevated "shrug" position for comfort - hurts to relax shoulder. Is unable to adduct R shoulder all the way up due to pain in [facility] area.   Skin:      Neurological:     Mental Status: He is alert.     UC Treatments / Results  Labs (all labs ordered are listed, but only abnormal results are displayed) Labs Reviewed - No data to display  EKG   Radiology No results found.  Procedures Procedures (including critical care time)  Medications Ordered in UC Medications - No data to display  Initial Impression / Assessment and Plan / UC Course  I have reviewed the triage vital signs and the nursing notes.  Pertinent labs & imaging results that were available during my care of the patient were reviewed by me and considered in my medical decision making (see chart for details).    I'm not confident pt's shoulder pain and ROM limitations are due to lipoma given location of lipoma and location of shoulder pain. Has CKD III, rx diclofenac gel to use topically, reviewed safe dosage of tylenol. Referred to sports medicine.   Final Clinical Impressions(s) / UC Diagnoses   Final diagnoses:  Acute pain of right shoulder   Discharge Instructions   None    ED Prescriptions     Medication Sig Dispense Auth. Provider   diclofenac Sodium (VOLTAREN) 1 % GEL Apply 2 g topically 4 (four) times daily. 150 g Carvel Getting, NP      PDMP not reviewed this encounter.   Carvel Getting, NP 10/20/21 [phone]

## 2021-11-26 ENCOUNTER — Encounter: Payer: Self-pay | Admitting: Pulmonary Disease

## 2021-11-26 ENCOUNTER — Ambulatory Visit (INDEPENDENT_AMBULATORY_CARE_PROVIDER_SITE_OTHER): Payer: Medicare Other | Admitting: Pulmonary Disease

## 2021-11-26 ENCOUNTER — Other Ambulatory Visit: Payer: Self-pay

## 2021-11-26 DIAGNOSIS — R911 Solitary pulmonary nodule: Secondary | ICD-10-CM

## 2021-11-26 DIAGNOSIS — J9611 Chronic respiratory failure with hypoxia: Secondary | ICD-10-CM

## 2021-11-26 DIAGNOSIS — J441 Chronic obstructive pulmonary disease with (acute) exacerbation: Secondary | ICD-10-CM | POA: Diagnosis not present

## 2021-11-26 NOTE — Progress Notes (Signed)
° °  Subjective:    Patient ID: [name], male    DOB: July 10, 1956, 66 y.o.   MRN: [id number]  HPI  66 yo smoker for FU of COPD with repeated exacerbations - on oxygen during sleep and exertion   He smoked about 2 packs/week starting as a teenager more than 30 pack years.  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  He also drinks 6-8 beers per week.   03/2021 hospitalized for 2 days for COPD exacerbation>> transient BiPAP, on discharge his oxygen saturation was 83% on ambulation and discharged on home oxygen 07/2021 flare >> pred taper   Chief Complaint  Patient presents with   Follow-up    Patient states he is having some trouble breathing over the last couple weeks. Runny nose. Some cough that's dry   He finally quit smoking to 3 months ago.  Reports fluctuation in his breathing, worse on cold days.  He is compliant with Trelegy.  He uses albuterol on an as-needed basis. Has not needed nebulizers much. Last office visit we gave him a short prednisone taper.  He has had 2 flareups in the past year including 1 major requiring hospitalization. We reviewed last CT scan  He has a lipoma on his right shoulder and may require same-day surgery.  He was unable to keep PFT appointments  Significant tests/ events reviewed  LDCT 05/2021 Interval decrease in size of the medial right apical nodule with a more linear/platelike     LDCT 11/2020 >> Centrilobular emphsyema .Stable 10 mm nodular opacity in the medial right lung apex with adjacent subpleural changes. This is likely scarring  - compared to 08/2020    Spirometry 01/2014 ratio 72, FEV1 1.97/68%, FVC 76%, no bronchodilator response  Review of Systems neg for any significant sore throat, dysphagia, itching, sneezing, nasal congestion or excess/ purulent secretions, fever, chills, sweats, unintended wt loss, pleuritic or exertional cp, hempoptysis, orthopnea pnd or change in chronic leg  swelling. Also denies presyncope, palpitations, heartburn, abdominal pain, nausea, vomiting, diarrhea or change in bowel or urinary habits, dysuria,hematuria, rash, arthralgias, visual complaints, headache, numbness weakness or ataxia.     Objective:   Physical Exam  Gen. Pleasant, well-nourished, in no distress ENT - no thrush, no pallor/icterus,no post nasal drip Neck: No JVD, no thyromegaly, no carotid bruits Lungs: no use of accessory muscles, no dullness to percussion, decreased  without rales or rhonchi  Cardiovascular: Rhythm regular, heart sounds  normal, no murmurs or gallops, no peripheral edema Musculoskeletal: No deformities, no cyanosis or clubbing        Assessment & Plan:    Tobacco abuse -I congratulated him on smoking cessation.  He would be high risk for relapse.

## 2021-11-26 NOTE — Assessment & Plan Note (Signed)
Nodule noted previously seems to be scar We will resume annual low-dose CT chest screening, next would be in July 2023

## 2021-11-26 NOTE — Assessment & Plan Note (Signed)
He did not desaturate on ambulation.  He can stay off oxygen for longer periods

## 2021-11-26 NOTE — Patient Instructions (Addendum)
°  Stay active Congratulations on Quitting  Continue trelegy  X ambulatory sat  X LD CT chest in july

## 2021-11-26 NOTE — Assessment & Plan Note (Signed)
Stay active Congratulations on Quitting  Continue trelegy  Reschedule PFTs. Will also refer him to pulmonary rehab program

## 2021-12-06 ENCOUNTER — Telehealth: Payer: Self-pay | Admitting: Pulmonary Disease

## 2021-12-06 NOTE — Telephone Encounter (Signed)
Attempted to call [name] with pulm rehab but unable to reach. Left message for her to return call.

## 2021-12-07 NOTE — Telephone Encounter (Signed)
ATC LVMTCB x 1 If pts calls back please schedule in first available 60 min PFT slot and cancel PFT in May.

## 2021-12-27 ENCOUNTER — Ambulatory Visit (HOSPITAL_COMMUNITY)
Admission: EM | Admit: 2021-12-27 | Discharge: 2021-12-27 | Disposition: A | Discharge status: Home / Self Care | Payer: Medicare Other | Attending: Family Medicine | Admitting: Family Medicine

## 2021-12-27 ENCOUNTER — Other Ambulatory Visit: Payer: Self-pay

## 2021-12-27 ENCOUNTER — Ambulatory Visit (INDEPENDENT_AMBULATORY_CARE_PROVIDER_SITE_OTHER): Payer: Medicare Other

## 2021-12-27 ENCOUNTER — Encounter (HOSPITAL_COMMUNITY): Payer: Self-pay | Admitting: *Deleted

## 2021-12-27 DIAGNOSIS — R0602 Shortness of breath: Secondary | ICD-10-CM

## 2021-12-27 DIAGNOSIS — J441 Chronic obstructive pulmonary disease with (acute) exacerbation: Secondary | ICD-10-CM

## 2021-12-27 DIAGNOSIS — R059 Cough, unspecified: Secondary | ICD-10-CM

## 2021-12-27 MED ORDER — PREDNISONE 10 MG (21) PO TBPK: ORAL_TABLET | Freq: Every day | ORAL | 0 refills | Status: DC

## 2021-12-27 MED ORDER — DOXYCYCLINE HYCLATE 100 MG PO CAPS: 100.0000 mg | ORAL_CAPSULE | Freq: Two times a day (BID) | ORAL | 0 refills | Status: DC

## 2021-12-27 NOTE — ED Provider Notes (Signed)
[facility]    CSN: [id number] Arrival date & time: 12/27/21  0867      History   Chief Complaint Chief Complaint  Patient presents with   Shortness of Breath    HPI [name] is a 66 y.o. male.   Patient is here for increased sob over the last 3-4 days.  + cough, wheezing.  No fevers/chills.  + runny nose, congestion, drainage.  H/o copd, has home oxygen to use as needed, but with the increasing sob he has been using home oxygen throughout the day.  He has nebs/inhalers and has increased that throughout the weekend.   Past Medical History:  Diagnosis Date   Asthma    COPD (chronic obstructive pulmonary disease) ([place])    Hypertension    Shortness of breath     Patient Active Problem List   Diagnosis Date Noted   Lipoma of back 10/19/2021   Chronic respiratory failure with hypoxia ([place]) 04/23/2021   AKI (acute kidney injury) ([place]) 04/05/2021   COPD exacerbation ([place]) 03/27/2021   Pulmonary nodule 1 cm or greater in diameter 03/19/2019   Primary hypertension    History of tobacco use 02/06/2014   Erectile dysfunction 12/31/2008   COPD (chronic obstructive pulmonary disease) ([place]) 12/31/2008   History of colonic polyps 01/18/2007    Past Surgical History:  Procedure Laterality Date   COLONOSCOPY         Home Medications    Prior to Admission medications   Medication Sig Start Date End Date Taking? Authorizing Provider  acetaminophen (TYLENOL) 500 MG tablet Take 1,000 mg by mouth as needed for mild pain.    [provider]  albuterol (VENTOLIN HFA) 108 (90 Base) MCG/ACT inhaler INHALE 2 PUFFS EVERY 6 HOURS AS NEEDED FOR WHEEZING OR SHORTNESS OF BREATH 10/19/21   Alcus Dad, MD  amLODipine (NORVASC) 10 MG tablet Take 1 tablet (10 mg total) by mouth daily. 10/19/21   Alcus Dad, MD  chlorthalidone (HYGROTON) 25 MG tablet Take 1 tablet (25 mg total) by mouth daily. 01/11/21   Mullis, Kiersten P, DO  diclofenac Sodium (VOLTAREN) 1  % GEL Apply 2 g topically 4 (four) times daily. 10/20/21   Carvel Getting, NP  Fluticasone-Umeclidin-Vilant (TRELEGY ELLIPTA) 100-62.5-25 MCG/ACT AEPB Inhale one puff into the lungs daily 10/19/21   Alcus Dad, MD  ipratropium-albuterol (DUONEB) 0.5-2.5 (3) MG/3ML SOLN INHALE 1 VIAL VIA NEBULIZER EVERY 6 HOURS AS NEEDED FOR SHORTNESS OF BREATH /  WHEEZING Patient taking differently: Inhale 3 mLs into the lungs every 6 (six) hours as needed (shortness of breath/wheezing). 01/25/21   Mullis, Archie Endo, DO    Family History Family History  Problem Relation Age of Onset   Cancer Father     Social History Social History   Tobacco Use   Smoking status: Former    Packs/day: 0.50    Years: 30.00    Pack years: 15.00    Types: Cigarettes    Quit date: 08/25/2020    Years since quitting: 1.3   Smokeless tobacco: Never  Vaping Use   Vaping Use: Never used  Substance Use Topics   Alcohol use: Yes    Comment: rare/occasional   Drug use: Not Currently     Allergies   Lisinopril, Spiriva respimat [tiotropium bromide monohydrate], and Tiotropium   Review of Systems Review of Systems  Constitutional:  Negative for activity change, appetite change, chills and fever.  HENT:  Positive for congestion and rhinorrhea. Negative for ear  discharge, sinus pain and sore throat.   Respiratory:  Positive for shortness of breath.   Cardiovascular: Negative.   Gastrointestinal: Negative.   Genitourinary: Negative.     Physical Exam Triage Vital Signs ED Triage Vitals  Enc Vitals Group     BP 12/27/21 0940 (!) 159/87     Pulse Rate 12/27/21 0940 76     Resp 12/27/21 0940 18     Temp 12/27/21 0940 98.7 F (37.1 C)     Temp src --      SpO2 12/27/21 0940 100 %     Weight --      Height --      Head Circumference --      Peak Flow --      Pain Score 12/27/21 0937 0     Pain Loc --      Pain Edu? --      Excl. in [place]? --    No data found.  Updated Vital Signs BP (!) 159/87     Pulse 76    Temp 98.7 F (37.1 C)    Resp 18    SpO2 100%   Visual Acuity Right Eye Distance:   Left Eye Distance:   Bilateral Distance:    Right Eye Near:   Left Eye Near:    Bilateral Near:     Physical Exam Constitutional:      Appearance: He is well-developed.  HENT:     Head: Normocephalic and atraumatic.     Mouth/Throat:     Pharynx: Oropharynx is clear.  Cardiovascular:     Rate and Rhythm: Normal rate and regular rhythm.  Pulmonary:     Effort: Pulmonary effort is normal.     Breath sounds: Examination of the left-lower field reveals decreased breath sounds. Decreased breath sounds present. No wheezing or rhonchi.  Musculoskeletal:     Cervical back: Normal range of motion and neck supple.  Lymphadenopathy:     Cervical: No cervical adenopathy.  Neurological:     Mental Status: He is alert.     UC Treatments / Results  Labs (all labs ordered are listed, but only abnormal results are displayed) Labs Reviewed - No data to display  EKG   Radiology DG Chest 2 View  Result Date: 12/27/2021 CLINICAL DATA:  Cough and shortness of breath EXAM: CHEST - 2 VIEW COMPARISON:  Chest x-ray dated Mar 27, 2021 FINDINGS: Cardiac and mediastinal contours are unchanged. Lungs are clear. No pleural effusion or pneumothorax. IMPRESSION: No active cardiopulmonary disease. Electronically Signed   By: Yetta Glassman M.D.   On: 12/27/2021 10:09    Procedures Procedures (including critical care time)  Medications Ordered in UC Medications - No data to display  Initial Impression / Assessment and Plan / UC Course  I have reviewed the triage vital signs and the nursing notes.  Pertinent labs & imaging results that were available during my care of the patient were reviewed by me and considered in my medical decision making (see chart for details).  Patient seen for copd exacerbation.  Exam was good, xray was normal.  However, will treat with steroid and antibiotic today.  Advised  to follow up if not improving or worsening, or go to the ER.    Final Clinical Impressions(s) / UC Diagnoses   Final diagnoses:  SOB (shortness of breath)  COPD exacerbation (HCC)     Discharge Instructions      You were seen today for sob, copd exacerbation.  Your xray was normal.  I have sent out a course of steroids as well as an antibiotic.  You will take the antibiotic twice/day x 10 days.  Please continue to use your nebulizer and inhaler, as well as your home oxygen as needed.  Please go to the ER if you have worsening shortness of breath despite the medications given.     ED Prescriptions     Medication Sig Dispense Auth. Provider   predniSONE (STERAPRED UNI-PAK 21 TAB) 10 MG (21) TBPK tablet Take by mouth daily. Take 6 tabs by mouth daily  for 2 days, then 5 tabs for 2 days, then 4 tabs for 2 days, then 3 tabs for 2 days, 2 tabs for 2 days, then 1 tab by mouth daily for 2 days 42 tablet Gurpreet Mikhail, MD   doxycycline (VIBRAMYCIN) 100 MG capsule Take 1 capsule (100 mg total) by mouth 2 (two) times daily. 20 capsule Rondel Oh, MD      PDMP not reviewed this encounter.   Rondel Oh, MD 12/27/21 1031

## 2021-12-27 NOTE — ED Triage Notes (Signed)
Pt reports worsening of SHOB over past 3-4 days. Pt has a cough with some mucous per Pt Pt arrives with own Nasal O2 on 2 liters.

## 2021-12-27 NOTE — Discharge Instructions (Addendum)
You were seen today for sob, copd exacerbation.  Your xray was normal.  I have sent out a course of steroids as well as an antibiotic.  You will take the antibiotic twice/day x 10 days.  Please continue to use your nebulizer and inhaler, as well as your home oxygen as needed.  Please go to the ER if you have worsening shortness of breath despite the medications given.

## 2022-01-10 NOTE — Telephone Encounter (Signed)
FYI: Patient's PFT moved up to 01/13/2022 at 4pm.

## 2022-01-10 NOTE — Telephone Encounter (Signed)
Noted.  Will close encounter.  

## 2022-02-15 ENCOUNTER — Ambulatory Visit (INDEPENDENT_AMBULATORY_CARE_PROVIDER_SITE_OTHER): Payer: Medicare Other | Admitting: Student

## 2022-02-15 ENCOUNTER — Other Ambulatory Visit: Payer: Self-pay | Admitting: Student

## 2022-02-15 ENCOUNTER — Other Ambulatory Visit: Payer: Self-pay

## 2022-02-15 ENCOUNTER — Encounter: Payer: Self-pay | Admitting: Student

## 2022-02-15 VITALS — BP 128/72 | HR 92 | Ht 69.0 in | Wt 149.8 lb

## 2022-02-15 DIAGNOSIS — J449 Chronic obstructive pulmonary disease, unspecified: Secondary | ICD-10-CM

## 2022-02-15 DIAGNOSIS — T7840XA Allergy, unspecified, initial encounter: Secondary | ICD-10-CM | POA: Insufficient documentation

## 2022-02-15 DIAGNOSIS — J441 Chronic obstructive pulmonary disease with (acute) exacerbation: Secondary | ICD-10-CM

## 2022-02-15 MED ORDER — AZITHROMYCIN 250 MG PO TABS: ORAL_TABLET | ORAL | 0 refills | Status: DC

## 2022-02-15 MED ORDER — FLUTICASONE PROPIONATE 50 MCG/ACT NA SUSP: 2.0000 | Freq: Every day | NASAL | 12 refills | Status: DC

## 2022-02-15 MED ORDER — IPRATROPIUM-ALBUTEROL 0.5-2.5 (3) MG/3ML IN SOLN: RESPIRATORY_TRACT | 11 refills | Status: DC

## 2022-02-15 MED ORDER — PREDNISONE 20 MG PO TABS: 40.0000 mg | ORAL_TABLET | Freq: Every day | ORAL | 0 refills | Status: AC

## 2022-02-15 MED ORDER — CETIRIZINE HCL 10 MG PO TABS: 10.0000 mg | ORAL_TABLET | Freq: Every day | ORAL | 11 refills | Status: DC

## 2022-02-15 NOTE — Patient Instructions (Addendum)
It was great to see you! Thank you for allowing me to participate in your care! ? ?It looks like you are having a COPD exacerbation, likely brought on from your allergies. We will give you a short course of steroids and antibiotics, as well as allergy medicine ? ?Our plans for today:  ?- Azithromycin 250 mg daily for 5 days ? Take 500 mg the first day ( 2 pills) ? Take 250 mg daily for 4 days ?- Prednisone 40 mg daily for 5 days ?- Zyrtec 10 mg daily ?- Fluticasone 2 spray each nostril, daily ?- Refill Duoneb solution ?- Follow up in 2 weeks ? Discuss COPD and Health Maintenance (coloscopy, vaccines, blood pressure, cholesterol, ect)  ? ?Take care and seek immediate care sooner if you develop any concerns.  ? ?Dr. [name], MD ?[place] ? ?

## 2022-02-15 NOTE — Progress Notes (Signed)
?SUBJECTIVE:  ? ?CHIEF COMPLAINT / HPI:  ? ?COPD flare: ?Has been intubate 3 x for COPD exacerbation. Had flare in 12/27/21, was seen in ED and prescribed Prednisone 10 mg taper, with doxy  ?Symp:Today he's having SOB using 2 L, not much cough, but increased sputum that was clear in appearance. Off oxygen feels light headed and dizzy. Has been feeling unwell for last 2 weeks. No complaints of chest pain. Usually doesn't need O2, but only uses it during an exacerbation.  ?O2: usually not needed, but has been on for 2 weeks. ?Meds: Trellegy daily, Duoneb BID and prn. Taking both daily.  ?*Wanting relill of duoneb ? ? ?Cold/Allergies: ?Been having issues for about 2 weeks. Cut grass one day, and had bad allergies the next day (nose, eyes, sneezing), had some SOB. Took allergy pill (unsure which kind) which helped with runny nose, but he still felt ill. Taking allergy pills with BP med.  ? ? ? ?PERTINENT  PMH / PSH: COPD, Asthma, HTN ? ?OBJECTIVE:  ?BP 128/72   Pulse 92   Ht '5\' 9"'$  (1.753 m)   Wt 149 lb 12.8 oz (67.9 kg)   SpO2 100%   BMI 22.12 kg/m?  ? ?General: NAD, pleasant, able to participate in exam ?Cardiac: RRR, no murmurs auscultated. ?Respiratory: Decreased air movement bilaterally with minimal end expiratory wheezes. Comfortable speech without signs of breathlessness. Normal work of breathing. No crackles ?Abdomen: soft, non-tender, non-distended, normoactive bowel sounds ?Extremities: warm and well perfused, no edema or cyanosis. ?Skin: warm and dry, no rashes noted ?Neuro: alert, no obvious focal deficits, speech normal ?Psych: Normal affect and mood ? ?ASSESSMENT/PLAN:  ?COPD exacerbation ([place]) ?Patient having increased work of breathing and sputum production for last 2 weeks. Episode triggered by allergies from yard work (patient complaining of rhinorrhea and itchy eyes). Has been using O2, but is usually not on O2 at home. Denies any infectious symptoms (fever, chills). No signs/symptoms of volume  overload or HF on exam/history. Patient had recent exacerbation in February and was tx with steroids and abx. Will treat with abx and steroids today. Will also start allergy medicine. Patient walked lap around the clinic while on O2, and saturation didn't drop below 96, patient ok for outpatient management of exacerbation.  ?-Azithromycin 250 mg x 5 days ?-Prednisone 40 mg x 5 days ?-Fluticasone 2 spray each nares, daily ?-Zyrtec 10 mg daily ?-F/u in 2 weeks (also discuss health maintenance) ? ?Allergies ?Patient complaining of continued issues with rhinorrhea and itchy eyes, after yard work one day. Has tried some allergy medicine with some relief. Allergies also seem to have triggered most recent COPD exacerbation. Will start Allergy medicine today. ?-Fluticasone 2 spray each nares, daily ?-Zyrtec 10 mg daily ?  ?Health Maintenance: ?Patient needing check up on: Cholesterol, A1c, HTN, Colonoscopy, Tdap ?-Patient to schedule appointment in 2 weeks for f/u ? ?No orders of the defined types were placed in this encounter. ? ?Meds ordered this encounter  ?Medications  ? ipratropium-albuterol (DUONEB) 0.5-2.5 (3) MG/3ML SOLN  ?  Sig: INHALE 1 VIAL VIA NEBULIZER EVERY 6 HOURS AS NEEDED FOR SHORTNESS OF BREATH /  WHEEZING  ?  Dispense:  270 mL  ?  Refill:  11  ? cetirizine (ZYRTEC) 10 MG tablet  ?  Sig: Take 1 tablet (10 mg total) by mouth daily.  ?  Dispense:  30 tablet  ?  Refill:  11  ? fluticasone (FLONASE) 50 MCG/ACT nasal spray  ?  Sig: Place 2  sprays into both nostrils daily. 1 spray in each nostril every day  ?  Dispense:  16 g  ?  Refill:  12  ? azithromycin (ZITHROMAX) 250 MG tablet  ?  Sig: Take 2 tabs (500 mg) the first day ?Then take 1 tab (250 mg) each day for 4 days  ?  Dispense:  6 each  ?  Refill:  0  ? predniSONE (DELTASONE) 20 MG tablet  ?  Sig: Take 2 tablets (40 mg total) by mouth daily with breakfast for 5 days.  ?  Dispense:  10 tablet  ?  Refill:  0  ? ?No follow-ups on file. ?'@SIGNNOTE'$ @ ? ?

## 2022-02-15 NOTE — Assessment & Plan Note (Signed)
Patient complaining of continued issues with rhinorrhea and itchy eyes, after yard work one day. Has tried some allergy medicine with some relief. Allergies also seem to have triggered most recent COPD exacerbation. Will start Allergy medicine today. ?-Fluticasone 2 spray each nares, daily ?-Zyrtec 10 mg daily ?

## 2022-02-15 NOTE — Assessment & Plan Note (Addendum)
Patient having increased work of breathing and sputum production for last 2 weeks. Episode triggered by allergies from yard work (patient complaining of rhinorrhea and itchy eyes). Has been using O2, but is usually not on O2 at home. Denies any infectious symptoms (fever, chills). No signs/symptoms of volume overload or HF on exam/history. Patient had recent exacerbation in February and was tx with steroids and abx. Will treat with abx and steroids today. Will also start allergy medicine. Patient walked lap around the clinic while on O2, and saturation didn't drop below 96, patient ok for outpatient management of exacerbation.  ?-Azithromycin 250 mg x 5 days ?-Prednisone 40 mg x 5 days ?-Fluticasone 2 spray each nares, daily ?-Zyrtec 10 mg daily ?-F/u in 2 weeks (also discuss health maintenance) ?

## 2022-02-16 ENCOUNTER — Other Ambulatory Visit: Payer: Self-pay | Admitting: Student

## 2022-02-16 DIAGNOSIS — J441 Chronic obstructive pulmonary disease with (acute) exacerbation: Secondary | ICD-10-CM

## 2022-02-16 MED ORDER — ALBUTEROL SULFATE (2.5 MG/3ML) 0.083% IN NEBU: 2.5000 mg | INHALATION_SOLUTION | Freq: Four times a day (QID) | RESPIRATORY_TRACT | 1 refills | Status: DC | PRN

## 2022-02-16 MED ORDER — IPRATROPIUM-ALBUTEROL 0.5-2.5 (3) MG/3ML IN SOLN: 3.0000 mL | Freq: Once | RESPIRATORY_TRACT | Status: DC

## 2022-02-16 MED ORDER — IPRATROPIUM-ALBUTEROL 0.5-2.5 (3) MG/3ML IN SOLN: RESPIRATORY_TRACT | 11 refills | Status: DC

## 2022-02-16 NOTE — Addendum Note (Signed)
Addended by: [name] T on: 02/16/2022 05:00 PM ? ? Modules accepted: Orders ? ?

## 2022-03-02 ENCOUNTER — Other Ambulatory Visit: Payer: Self-pay | Admitting: Family Medicine

## 2022-03-02 DIAGNOSIS — J449 Chronic obstructive pulmonary disease, unspecified: Secondary | ICD-10-CM

## 2022-03-04 ENCOUNTER — Ambulatory Visit: Payer: Medicare Other | Admitting: Student

## 2022-03-04 NOTE — Progress Notes (Deleted)
?  SUBJECTIVE:  ? ?CHIEF COMPLAINT / HPI:  ? ?HTN ?BP today ?Meds: Amlodipine 10, Chlorthalidone 25 ?Compliance: ?Symptoms: ?BMP ? ? ?HLD ?Lab Results  ?Component Value Date  ? CHOL 159 08/07/2020  ? HDL 61 08/07/2020  ? [place] 81 08/07/2020  ? TRIG 95 08/07/2020  ? CHOLHDL 2.6 08/07/2020  ?Meds: None ?Lipid panel ? ?DM ?Lab Results  ?Component Value Date  ? HGBA1C 4.5 08/07/2020  ?Meds: None ?Symp: ?A1c ? ?PHQ9 ? ?Tobacco? ?AAA screen ? ?Colonoscopy ?Last in 2008/2018?? ? ?Vaccine: ?Tdap ?Shingrix ? ?PERTINENT  PMH / PSH: COPD, HTN  ? ? ? ? ?OBJECTIVE:  ?There were no vitals taken for this visit. ? ?General: NAD, pleasant, able to participate in exam ?Cardiac: RRR, no murmurs auscultated. ?Respiratory: CTAB, normal effort, no wheezes, rales or rhonchi ?Abdomen: soft, non-tender, non-distended, normoactive bowel sounds ?Extremities: warm and well perfused, no edema or cyanosis. ?Skin: warm and dry, no rashes noted ?Neuro: alert, no obvious focal deficits, speech normal ?Psych: Normal affect and mood ? ?ASSESSMENT/PLAN:  ?No problem-specific Assessment & Plan notes found for this encounter. ?  ?No orders of the defined types were placed in this encounter. ? ?No orders of the defined types were placed in this encounter. ? ?No follow-ups on file. ?'@SIGNNOTE'$ @ ?{    This will disappear when note is signed, click to select method of visit    :1} ? ?

## 2022-03-04 NOTE — Progress Notes (Deleted)
  SUBJECTIVE:   CHIEF COMPLAINT / HPI:   HTN Meds: Amlodipine 10, Chlorthalidone 25 Compliance: Symptoms: -BMP   DM Lab Results  Component Value Date   HGBA1C 4.5 08/07/2020  Symptoms: -Hgb A1c   HLD Lab Results  Component Value Date   CHOL 159 08/07/2020   HDL 61 08/07/2020   LDLCALC 81 08/07/2020   TRIG 95 08/07/2020   CHOLHDL 2.6 08/07/2020  -Lipid panel  Colonoscopy  Vaccines: Tdap Shirngrix  PERTINENT  PMH / PSH: COPD, HTN      OBJECTIVE:  There were no vitals taken for this visit.  General: NAD, pleasant, able to participate in exam Cardiac: RRR, no murmurs auscultated. Respiratory: CTAB, normal effort, no wheezes, rales or rhonchi Abdomen: soft, non-tender, non-distended, normoactive bowel sounds Extremities: warm and well perfused, no edema or cyanosis. Skin: warm and dry, no rashes noted Neuro: alert, no obvious focal deficits, speech normal Psych: Normal affect and mood  ASSESSMENT/PLAN:  No problem-specific Assessment & Plan notes found for this encounter.   No orders of the defined types were placed in this encounter.  No orders of the defined types were placed in this encounter.  No follow-ups on file. '@SIGNNOTE'$ @ {    This will disappear when note is signed, click to select method of visit    :1}

## 2022-03-04 NOTE — Patient Instructions (Incomplete)
It was great to see you! Thank you for allowing me to participate in your care! ? ?I recommend that you always bring your medications to each appointment as this makes it easy to ensure we are on the correct medications and helps [place] not miss when refills are needed. ? ?Our plans for today:  ?- High Blood Pressure ?- Diabetes ?- High Cholesterol ?- Colonoscopy ?- Vaccines ? ?We are checking some labs today, I will call you if they are abnormal will send you a MyChart message or a letter if they are normal.  If you do not hear about your labs in the next 2 weeks please let us know.*** ? ?Take care and seek immediate care sooner if you develop any concerns.  ? ?Dr. [name], MD ?[place] ? ?

## 2022-03-12 ENCOUNTER — Ambulatory Visit (HOSPITAL_COMMUNITY)
Admission: EM | Admit: 2022-03-12 | Discharge: 2022-03-12 | Disposition: A | Discharge status: Home / Self Care | Payer: Medicare Other | Attending: Nurse Practitioner | Admitting: Nurse Practitioner

## 2022-03-12 ENCOUNTER — Encounter (HOSPITAL_COMMUNITY): Payer: Self-pay | Admitting: *Deleted

## 2022-03-12 ENCOUNTER — Other Ambulatory Visit: Payer: Self-pay

## 2022-03-12 DIAGNOSIS — J441 Chronic obstructive pulmonary disease with (acute) exacerbation: Secondary | ICD-10-CM | POA: Diagnosis not present

## 2022-03-12 MED ORDER — METHYLPREDNISOLONE SODIUM SUCC 125 MG IJ SOLR
INTRAMUSCULAR | Status: AC
  Filled 2022-03-12: qty 2

## 2022-03-12 MED ORDER — CEFUROXIME AXETIL 500 MG PO TABS
500.0000 mg | ORAL_TABLET | Freq: Two times a day (BID) | ORAL | 0 refills | Status: AC
Start: 2022-03-12 — End: 2022-03-17

## 2022-03-12 MED ORDER — PREDNISONE 20 MG PO TABS: 40.0000 mg | ORAL_TABLET | Freq: Every day | ORAL | 0 refills | Status: AC

## 2022-03-12 MED ORDER — METHYLPREDNISOLONE SODIUM SUCC 125 MG IJ SOLR
60.0000 mg | Freq: Once | INTRAMUSCULAR | Status: AC
  Administered 2022-03-12: 60 mg via INTRAMUSCULAR

## 2022-03-12 NOTE — Discharge Instructions (Signed)
-   Please follow-up with your primary care provider even if your symptoms improve to discuss maintenance medicine for COPD ?-We have given you a shot of Solu-Medrol (steroid) today in clinic to help with the exacerbation ?-Please start the prednisone tomorrow ?-I have given you prescription for Ceftin that you can use if you start coughing up discolored mucus ?-If your symptoms worsen, please go to the emergency room ?

## 2022-03-12 NOTE — ED Triage Notes (Signed)
PT reports SHOB and productive cough. Pt O2 dependent on 2 liters. ?

## 2022-03-12 NOTE — ED Provider Notes (Signed)
?[facility] ? ? ? ?CSN: [id number] ?Arrival date & time: 03/12/22  1300 ? ? ?  ? ?History   ?Chief Complaint ?Chief Complaint  ?Patient presents with  ? Shortness of Breath  ? Cough  ? ? ?HPI ?[name] is a 66 y.o. male.  ? ?Patient presents today for what he thinks is a COPD exacerbation.  He reports worsening shortness of breath, chest tightness, cough that produces clear sputum for the past week or so.  He reports he had a follow-up visit with his primary care provider, however missed it because his wife was in a car accident.  He reports he is taking his inhalers as prescribed.  He reports usually when this happens, he is given prednisone and an antibiotic.  Thinks it was about 3 months since the last time he was treated.  Does report the breathing is worse with exertion.  He is currently wearing oxygen, for does not wear oxygen at his baseline.  Denies any active chest pain, palpitations, nausea/vomiting, fevers, diaphoresis.  He is eating and drinking normally. ? ?He has had 3 COPD exacerbations that have ended up at the emergency room and with him intubated.  He is worried about having to go back to the emergency room. ? ? ? ?Past Medical History:  ?Diagnosis Date  ? Asthma   ? COPD (chronic obstructive pulmonary disease) ([place])   ? Hypertension   ? Shortness of breath   ? ? ?Patient Active Problem List  ? Diagnosis Date Noted  ? Allergies 02/15/2022  ? Lipoma of back 10/19/2021  ? Chronic respiratory failure with hypoxia ([place]) 04/23/2021  ? AKI (acute kidney injury) ([place]) 04/05/2021  ? COPD exacerbation ([place]) 03/27/2021  ? Pulmonary nodule 1 cm or greater in diameter 03/19/2019  ? Primary hypertension   ? History of tobacco use 02/06/2014  ? Erectile dysfunction 12/31/2008  ? COPD (chronic obstructive pulmonary disease) ([place]) 12/31/2008  ? History of colonic polyps 01/18/2007  ? ? ?Past Surgical History:  ?Procedure Laterality Date  ? COLONOSCOPY    ? ? ? ? ? ?Home Medications   ? ?Prior to  Admission medications   ?Medication Sig Start Date End Date Taking? Authorizing Provider  ?cefUROXime (CEFTIN) 500 MG tablet Take 1 tablet (500 mg total) by mouth 2 (two) times daily with a meal for 5 days. 03/12/22 03/17/22 Yes Eulogio Bear, NP  ?predniSONE (DELTASONE) 20 MG tablet Take 2 tablets (40 mg total) by mouth daily with breakfast for 5 days. 03/12/22 03/17/22 Yes Eulogio Bear, NP  ?acetaminophen (TYLENOL) 500 MG tablet Take 1,000 mg by mouth as needed for mild pain.    [provider]  ?albuterol (VENTOLIN HFA) 108 (90 Base) MCG/ACT inhaler INHALE 2 PUFFS EVERY 6 HOURS AS NEEDED FOR WHEEZING OR SHORTNESS OF BREATH 10/19/21   Alcus Dad, MD  ?amLODipine (NORVASC) 10 MG tablet Take 1 tablet (10 mg total) by mouth daily. 10/19/21   Alcus Dad, MD  ?cetirizine (ZYRTEC) 10 MG tablet Take 1 tablet (10 mg total) by mouth daily. 02/15/22   [name], MD  ?chlorthalidone (HYGROTON) 25 MG tablet Take 1 tablet (25 mg total) by mouth daily. 01/11/21   Mullis, Kiersten P, DO  ?diclofenac Sodium (VOLTAREN) 1 % GEL Apply 2 g topically 4 (four) times daily. 10/20/21   Carvel Getting, NP  ?fluticasone (FLONASE) 50 MCG/ACT nasal spray Place 2 sprays into both nostrils daily. 1 spray in each nostril every day 02/15/22  [name], MD  ?Fluticasone-Umeclidin-Vilant (TRELEGY ELLIPTA) 100-62.5-25 MCG/ACT AEPB TAKE 1 PUFF BY MOUTH EVERY DAY 03/02/22   [name], MD  ?ipratropium-albuterol (DUONEB) 0.5-2.5 (3) MG/3ML SOLN INHALE 1 VIAL VIA NEBULIZER EVERY 6 HOURS AS NEEDED FOR SHORTNESS OF BREATH /  WHEEZING 02/16/22   [name], MD  ? ? ?Family History ?Family History  ?Problem Relation Age of Onset  ? Cancer Father   ? ? ?Social History ?Social History  ? ?Tobacco Use  ? Smoking status: Former  ?  Packs/day: 0.50  ?  Years: 30.00  ?  Pack years: 15.00  ?  Types: Cigarettes  ?  Quit date: 08/25/2020  ?  Years since quitting: 1.5  ? Smokeless tobacco: Never  ?Vaping Use  ? Vaping  Use: Never used  ?Substance Use Topics  ? Alcohol use: Yes  ?  Comment: rare/occasional  ? Drug use: Not Currently  ? ? ? ?Allergies   ?Lisinopril, Spiriva respimat [tiotropium bromide monohydrate], and Tiotropium ? ? ?Review of Systems ?Review of Systems ?Per HPI ? ?Physical Exam ?Triage Vital Signs ?ED Triage Vitals  ?Enc Vitals Group  ?   BP 03/12/22 1438 (!) 179/90  ?   Pulse Rate 03/12/22 1438 67  ?   Resp 03/12/22 1438 20  ?   Temp 03/12/22 1438 98.4 ?F (36.9 ?C)  ?   Temp src --   ?   SpO2 03/12/22 1438 98 %  ?   Weight --   ?   Height --   ?   Head Circumference --   ?   Peak Flow --   ?   Pain Score 03/12/22 1439 0  ?   Pain Loc --   ?   Pain Edu? --   ?   Excl. in [place]? --   ? ?No data found. ? ?Updated Vital Signs ?BP (!) 179/90   Pulse 67   Temp 98.4 ?F (36.9 ?C)   Resp 20   SpO2 98%  ? ?Visual Acuity ?Right Eye Distance:   ?Left Eye Distance:   ?Bilateral Distance:   ? ?Right Eye Near:   ?Left Eye Near:    ?Bilateral Near:    ? ?Physical Exam ?Vitals and nursing note reviewed.  ?Constitutional:   ?   General: He is not in acute distress. ?   Appearance: He is well-developed. He is not toxic-appearing.  ?HENT:  ?   Head: Normocephalic and atraumatic.  ?   Mouth/Throat:  ?   Mouth: Mucous membranes are moist.  ?   Pharynx: No pharyngeal swelling.  ?Eyes:  ?   Pupils: Pupils are equal, round, and reactive to light.  ?Cardiovascular:  ?   Rate and Rhythm: Normal rate and regular rhythm.  ?Pulmonary:  ?   Effort: Tachypnea present. No accessory muscle usage or respiratory distress.  ?   Breath sounds: Examination of the right-lower field reveals wheezing. Examination of the left-lower field reveals wheezing. Decreased breath sounds and wheezing present. No rhonchi or rales.  ?Chest:  ?   Chest wall: No mass.  ?Musculoskeletal:  ?   Cervical back: Neck supple.  ?   Right lower leg: No edema.  ?   Left lower leg: No edema.  ?Lymphadenopathy:  ?   Cervical: No cervical adenopathy.  ?Skin: ?   General: Skin is  warm and dry.  ?   Capillary Refill: Capillary refill takes less than 2 seconds.  ?   Coloration: Skin is not cyanotic  or pale.  ?   Findings: No erythema.  ?Neurological:  ?   Mental Status: He is alert and oriented to person, place, and time.  ? ? ? ?UC Treatments / Results  ?Labs ?(all labs ordered are listed, but only abnormal results are displayed) ?Labs Reviewed - No data to display ? ?EKG ? ? ?Radiology ?No results found. ? ?Procedures ?Procedures (including critical care time) ? ?Medications Ordered in UC ?Medications  ?methylPREDNISolone sodium succinate (SOLU-MEDROL) 125 mg/2 mL injection 60 mg (60 mg Intramuscular Given 03/12/22 1538)  ? ? ?Initial Impression / Assessment and Plan / UC Course  ?I have reviewed the triage vital signs and the nursing notes. ? ?Pertinent labs & imaging results that were available during my care of the patient were reviewed by me and considered in my medical decision making (see chart for details). ? ?  ?We will treat patient for COPD exacerbation with Solu-Medrol 60 mg today in urgent care.  Tomorrow, start prednisone 40 mg for 5 days.  Discussed use of antibiotics-he is not coughing up purulent sputum, therefore I think we can hold off on antibiotics.  However, given his significant history and severity of COPD, I gave the patient a prescription for Ceftin that he can start if he starts coughing up thick, discolored mucus.  Encouraged follow-up with primary care provider to see if maintenance inhaler medications need to be adjusted.  Upon review of chart, it looks like this is his about third exacerbation in the past 2 to 3 months.  We discussed if his symptoms worsen, he is to go to emergency room.  The patient was given the opportunity to ask questions.  All questions answered to their satisfaction.  The patient is in agreement to this plan.  ? ?Final Clinical Impressions(s) / UC Diagnoses  ? ?Final diagnoses:  ?COPD exacerbation ([place])  ? ? ? ?Discharge Instructions   ? ?   ?- Please follow-up with your primary care provider even if your symptoms improve to discuss maintenance medicine for COPD ?-We have given you a shot of Solu-Medrol (steroid) today in clinic to help with

## 2022-03-17 ENCOUNTER — Ambulatory Visit: Payer: Medicare Other | Admitting: Student

## 2022-03-17 NOTE — Progress Notes (Deleted)
    SUBJECTIVE:   CHIEF COMPLAINT / HPI:   COPD Hx of being intubated three times, recent outpatient treatment 02/15/2022 with antibiotic and steroids.  PERTINENT  PMH / PSH: ***  OBJECTIVE:   There were no vitals taken for this visit.  ***  ASSESSMENT/PLAN:   No problem-specific Assessment & Plan notes found for this encounter.     Gerrit Heck, MD [place]

## 2022-03-24 ENCOUNTER — Ambulatory Visit: Payer: Medicare Other | Admitting: Student

## 2022-03-28 ENCOUNTER — Ambulatory Visit: Payer: Medicare Other | Admitting: Primary Care

## 2022-03-29 ENCOUNTER — Emergency Department (HOSPITAL_COMMUNITY): Payer: Medicare Other

## 2022-03-29 ENCOUNTER — Other Ambulatory Visit: Payer: Self-pay

## 2022-03-29 ENCOUNTER — Emergency Department (HOSPITAL_COMMUNITY)
Admission: EM | Admit: 2022-03-29 | Discharge: 2022-03-30 | Discharge status: Against Medical Advice | Payer: Medicare Other | Attending: Emergency Medicine | Admitting: Emergency Medicine

## 2022-03-29 ENCOUNTER — Encounter (HOSPITAL_COMMUNITY): Payer: Self-pay

## 2022-03-29 DIAGNOSIS — Z5321 Procedure and treatment not carried out due to patient leaving prior to being seen by health care provider: Secondary | ICD-10-CM | POA: Diagnosis not present

## 2022-03-29 DIAGNOSIS — R5383 Other fatigue: Secondary | ICD-10-CM | POA: Diagnosis not present

## 2022-03-29 DIAGNOSIS — R062 Wheezing: Secondary | ICD-10-CM | POA: Insufficient documentation

## 2022-03-29 DIAGNOSIS — R0602 Shortness of breath: Secondary | ICD-10-CM | POA: Diagnosis present

## 2022-03-29 DIAGNOSIS — R059 Cough, unspecified: Secondary | ICD-10-CM | POA: Diagnosis not present

## 2022-03-29 LAB — COMPREHENSIVE METABOLIC PANEL
ALT: 77 U/L — ABNORMAL HIGH (ref 0–44)
AST: 52 U/L — ABNORMAL HIGH (ref 15–41)
Albumin: 3.7 g/dL (ref 3.5–5.0)
Alkaline Phosphatase: 59 U/L (ref 38–126)
Anion gap: 6 (ref 5–15)
BUN: 18 mg/dL (ref 8–23)
CO2: 19 mmol/L — ABNORMAL LOW (ref 22–32)
Calcium: 9.5 mg/dL (ref 8.9–10.3)
Chloride: 115 mmol/L — ABNORMAL HIGH (ref 98–111)
Creatinine, Ser: 1.28 mg/dL — ABNORMAL HIGH (ref 0.61–1.24)
GFR, Estimated: >60 mL/min (ref >60)
Glucose, Bld: 78 mg/dL (ref 70–99)
Potassium: 4.5 mmol/L (ref 3.5–5.1)
Sodium: 140 mmol/L (ref 135–145)
Total Bilirubin: 0.5 mg/dL (ref 0.3–1.2)
Total Protein: 6.7 g/dL (ref 6.5–8.1)

## 2022-03-29 LAB — CBC
HCT: 41.7 % (ref 39.0–52.0)
Hemoglobin: 13.8 g/dL (ref 13.0–17.0)
MCH: 33.7 pg (ref 26.0–34.0)
MCHC: 33.1 g/dL (ref 30.0–36.0)
MCV: 101.7 fL — ABNORMAL HIGH (ref 80.0–100.0)
Platelets: 239 10*3/uL (ref 150–400)
RBC: 4.1 MIL/uL — ABNORMAL LOW (ref 4.22–5.81)
RDW: 12.6 % (ref 11.5–15.5)
WBC: 6.8 10*3/uL (ref 4.0–10.5)
nRBC: 0 % (ref 0.0–0.2)

## 2022-03-29 LAB — TROPONIN I (HIGH SENSITIVITY): Troponin I (High Sensitivity): 7 ng/L (ref <18)

## 2022-03-29 MED ORDER — PREDNISONE 20 MG PO TABS: 60.0000 mg | ORAL_TABLET | Freq: Once | ORAL | Status: DC

## 2022-03-29 MED ORDER — ALBUTEROL SULFATE HFA 108 (90 BASE) MCG/ACT IN AERS: 4.0000 | INHALATION_SPRAY | Freq: Once | RESPIRATORY_TRACT | Status: DC

## 2022-03-29 MED ORDER — AEROCHAMBER PLUS FLO-VU MEDIUM MISC
1.0000 | Freq: Once | Status: DC
  Filled 2022-03-29: qty 1

## 2022-03-29 NOTE — ED Provider Triage Note (Signed)
Emergency Medicine Provider Triage Evaluation Note ? ?[name] , a 66 y.o. male  was evaluated in triage.  Pt complains of difficulty breathing 7-8 days of wheezing, SOB, fatigue, cough. ? ? ?No fever or CP. States this feels just like COPD exacerbations in the past.  ? ? ? ? ?Review of Systems  ?Positive: SOB, wheezing ?Negative: Fever  ? ?Physical Exam  ?BP (!) 169/84 (BP Location: Right Arm)   Pulse 83   Temp 98.6 ?F (37 ?C) (Oral)   Resp 15   SpO2 97%  ?Gen:   Awake, no distress ?Resp:  Normal effort, on O2 2L (home O2) ?MSK:   Moves extremities without difficulty  ?Other:  Faint wheezing  ? ?Medical Decision Making  ?Medically screening exam initiated at 9:59 PM.  Appropriate orders placed.  [name] was informed that the remainder of the evaluation will be completed by another provider, this initial triage assessment does not replace that evaluation, and the importance of remaining in the ED until their evaluation is complete. ? ?Labs, cxr, pred, albut ?  ?Tedd Sias, [place] ?03/29/22 2201 ? ?

## 2022-03-31 ENCOUNTER — Ambulatory Visit (INDEPENDENT_AMBULATORY_CARE_PROVIDER_SITE_OTHER): Payer: Medicare Other | Admitting: Student

## 2022-03-31 ENCOUNTER — Encounter: Payer: Self-pay | Admitting: Student

## 2022-03-31 VITALS — BP 159/87 | HR 78 | Ht 69.0 in | Wt 149.6 lb

## 2022-03-31 DIAGNOSIS — Z131 Encounter for screening for diabetes mellitus: Secondary | ICD-10-CM

## 2022-03-31 DIAGNOSIS — J449 Chronic obstructive pulmonary disease, unspecified: Secondary | ICD-10-CM | POA: Diagnosis not present

## 2022-03-31 DIAGNOSIS — Z13220 Encounter for screening for lipoid disorders: Secondary | ICD-10-CM | POA: Diagnosis not present

## 2022-03-31 DIAGNOSIS — Z0000 Encounter for general adult medical examination without abnormal findings: Secondary | ICD-10-CM | POA: Diagnosis not present

## 2022-03-31 LAB — POCT GLYCOSYLATED HEMOGLOBIN (HGB A1C): Hemoglobin A1C: 4.8 % (ref 4.0–5.6)

## 2022-03-31 MED ORDER — TRELEGY ELLIPTA 100-62.5-25 MCG/ACT IN AEPB: INHALATION_SPRAY | RESPIRATORY_TRACT | 5 refills | Status: DC

## 2022-03-31 MED ORDER — IPRATROPIUM-ALBUTEROL 0.5-2.5 (3) MG/3ML IN SOLN: RESPIRATORY_TRACT | 11 refills | Status: DC

## 2022-03-31 MED ORDER — PREDNISONE 20 MG PO TABS: 40.0000 mg | ORAL_TABLET | Freq: Every day | ORAL | 0 refills | Status: AC

## 2022-03-31 MED ORDER — AZITHROMYCIN 250 MG PO TABS: ORAL_TABLET | ORAL | 0 refills | Status: DC

## 2022-03-31 NOTE — Assessment & Plan Note (Signed)
PCP Dr. Marcina Millard had ordered BMP, A1c, and Lipids at last visit but these were never collected. Will collect today and route results to Dr. Marcina Millard.  ?

## 2022-03-31 NOTE — Assessment & Plan Note (Signed)
Appears to be in exacerbation today with increased dyspnea, cough, and sputum quantity.  Patient has been treated for several COPD exacerbations in recent months.  Consider if no improvement to azithromycin and prednisone, that he may need to have antibiotic coverage escalated to cover Pseudomonas or drug-resistant organisms.  Discussed with patient, he will call our office if no improvement after a few days of therapy. ?-Azithromycin 500 mg x 1 day followed by 250 mg x 4 days ?-Prednisone 40 mg x 5 days ?

## 2022-03-31 NOTE — Progress Notes (Signed)
? ? ?  SUBJECTIVE:  ? ?CHIEF COMPLAINT / HPI:  ? ?COPD Exacerbation ?Patient presents with 1 week of shortness of breath with increased cough and phlegm production.  Phlegm is nonpurulent.  He has oxygen at home that he usually only uses as needed at night but he has been on 2 L consistently since some symptoms began about 1 week ago.  He went to the ED Tuesday night via EMS but left prior to being seen stating "I will just die at home." ?He has had multiple COPD exacerbations over the last several months.  Was most recently treated with prednisone and doxycycline.  Denies any fever or other infectious symptoms at this time.  He is not a smoker.  He is on Trelegy at home and reports that this is very helpful though he needs refills.  He is also requesting refills of his DuoNeb vials. ? ? ?OBJECTIVE:  ? ?BP (!) 159/87   Pulse 78   Ht '5\' 9"'$  (1.753 m)   Wt 149 lb 9.6 oz (67.9 kg)   SpO2 100%   BMI 22.09 kg/m?   ?Physical Exam ?Constitutional:   ?   General: He is not in acute distress. ?Cardiovascular:  ?   Rate and Rhythm: Normal rate and regular rhythm.  ?Pulmonary:  ?   Effort: Pulmonary effort is normal.  ?   Breath sounds: Examination of the right-upper field reveals decreased breath sounds. Examination of the left-upper field reveals decreased breath sounds. Examination of the right-middle field reveals wheezing. Examination of the left-middle field reveals wheezing. Examination of the right-lower field reveals wheezing. Examination of the left-lower field reveals wheezing. Decreased breath sounds and wheezing present.  ? ? ? ?ASSESSMENT/PLAN:  ? ?Health care maintenance ?PCP Dr. Marcina Millard had ordered BMP, A1c, and Lipids at last visit but these were never collected. Will collect today and route results to Dr. Marcina Millard.  ? ?COPD (chronic obstructive pulmonary disease) ([place]) ?Appears to be in exacerbation today with increased dyspnea, cough, and sputum quantity.  Patient has been treated for several COPD  exacerbations in recent months.  Consider if no improvement to azithromycin and prednisone, that he may need to have antibiotic coverage escalated to cover Pseudomonas or drug-resistant organisms.  Discussed with patient, he will call our office if no improvement after a few days of therapy. ?-Azithromycin 500 mg x 1 day followed by 250 mg x 4 days ?-Prednisone 40 mg x 5 days ?  ?Pearla Dubonnet, MD ?[place]  ?

## 2022-03-31 NOTE — Patient Instructions (Signed)
[name], ? ?I am so sorry that you are not feeling well.  I think that you are right and that you are having a COPD exacerbation.  I will prescribe you azithromycin and prednisone for the next 5 days.  I am also refilling your Trelegy and your DuoNeb vials.  If you notice that you are not feeling better in the next 2 days or so, please call our clinic as we may need to expand your antibiotic coverage.  We would like to see you back in about 1 week to make sure you are doing better. ? ?Pearla Dubonnet, MD ? ?

## 2022-04-01 LAB — LIPID PANEL
Chol/HDL Ratio: 3.7 ratio (ref 0.0–5.0)
Cholesterol, Total: 221 mg/dL — ABNORMAL HIGH (ref 100–199)
HDL: 59 mg/dL (ref >39)
LDL Chol Calc (NIH): 142 mg/dL — ABNORMAL HIGH (ref 0–99)
Triglycerides: 110 mg/dL (ref 0–149)
VLDL Cholesterol Cal: 20 mg/dL (ref 5–40)

## 2022-04-07 ENCOUNTER — Ambulatory Visit: Payer: Medicare Other | Admitting: Student

## 2022-04-07 NOTE — Patient Instructions (Incomplete)
It was great to see you! Thank you for allowing me to participate in your care!  I recommend that you always bring your medications to each appointment as this makes it easy to ensure we are on the correct medications and helps [place] not miss when refills are needed.  Our plans for today:  - Colonoscopy - High Cholesterol - High Blood Pressure    Take care and seek immediate care sooner if you develop any concerns.   Dr. [name], MD [place]

## 2022-04-07 NOTE — Progress Notes (Deleted)
  SUBJECTIVE:   CHIEF COMPLAINT / HPI:   F/u COPD and labs  COPD exacerbation 03/31/22 - Azithromycin 250 mg and Prednisone 40 mg x 5 days Symptoms:   Health Maintenance:  HTN: BP Readings from Last 3 Encounters:  03/31/22 (!) 159/87  03/29/22 (!) 169/84  03/12/22 (!) 179/90  Symptoms: Meds: Chlorthalidone 25, amlodipine 10 Compliance:  HLD: Lab Results  Component Value Date   CHOL 221 (H) 03/31/2022   HDL 59 03/31/2022   LDLCALC 142 (H) 03/31/2022   TRIG 110 03/31/2022   CHOLHDL 3.7 03/31/2022  Recommend statin: Crestor 20 The 10-year ASCVD risk score (Arnett DK, et al., 2019) is: 38.5%   Values used to calculate the score:     Age: 66 years     Sex: Male     Is Non-Hispanic African American: Yes     Diabetic: No     Tobacco smoker: Yes     Systolic Blood Pressure: 680 mmHg     Is BP treated: Yes     HDL Cholesterol: 59 mg/dL     Total Cholesterol: 221 mg/dL   DM Lab Results  Component Value Date   HGBA1C 4.8 03/31/2022  No concern at this time   Screenings Colonoscopy: Can't find last record.   Lung Nodule CT Chest Tobacco use: EtOH: Drugs:   Vaccines: T-Dap  PERTINENT  PMH / PSH: Asthma, COPD, HTN, HLD    OBJECTIVE:  There were no vitals taken for this visit.  General: NAD, pleasant, able to participate in exam Cardiac: RRR, no murmurs auscultated. Respiratory: CTAB, normal effort, no wheezes, rales or rhonchi Abdomen: soft, non-tender, non-distended, normoactive bowel sounds Extremities: warm and well perfused, no edema or cyanosis. Skin: warm and dry, no rashes noted Neuro: alert, no obvious focal deficits, speech normal Psych: Normal affect and mood  ASSESSMENT/PLAN:  No problem-specific Assessment & Plan notes found for this encounter.   No orders of the defined types were placed in this encounter.  No orders of the defined types were placed in this encounter.  No follow-ups on file. '@SIGNNOTE'$ @ {    This will disappear  when note is signed, click to select method of visit    :1}

## 2022-04-14 ENCOUNTER — Encounter: Payer: Self-pay | Admitting: Family Medicine

## 2022-04-14 ENCOUNTER — Ambulatory Visit (INDEPENDENT_AMBULATORY_CARE_PROVIDER_SITE_OTHER): Payer: Medicare Other | Admitting: Family Medicine

## 2022-04-14 ENCOUNTER — Other Ambulatory Visit: Payer: Self-pay

## 2022-04-14 VITALS — BP 140/70 | HR 87 | Wt 153.4 lb

## 2022-04-14 DIAGNOSIS — I10 Essential (primary) hypertension: Secondary | ICD-10-CM | POA: Diagnosis not present

## 2022-04-14 DIAGNOSIS — R0609 Other forms of dyspnea: Secondary | ICD-10-CM | POA: Diagnosis not present

## 2022-04-14 DIAGNOSIS — R0600 Dyspnea, unspecified: Secondary | ICD-10-CM | POA: Insufficient documentation

## 2022-04-14 MED ORDER — PREDNISONE 20 MG PO TABS: 40.0000 mg | ORAL_TABLET | Freq: Every day | ORAL | 0 refills | Status: DC

## 2022-04-14 NOTE — Patient Instructions (Addendum)
It was wonderful to see you today.  Please bring ALL of your medications with you to every visit.   Today we talked about:  -We are going to schedule an echo, or ultrasound of your heart.  We will call you with that appointment time. -I will send a short supply of prednisone but this does not sound like COPD exacerbation to me. -I think is important that you get in with your pulmonologist to see if there are any adjustments that need to be made -If you develop fevers, increased sputum production or worsening cough, please return. -If you develop worsening shortness of breath or chest pain please go to the emergency department immediately.  Thank you for choosing [facility].   Please call [phone] with any questions about today's appointment.  Please be sure to schedule follow up at the front  desk before you leave today.   Sharion Settler, DO PGY-2 Family Medicine

## 2022-04-14 NOTE — Progress Notes (Signed)
    SUBJECTIVE:   CHIEF COMPLAINT / HPI:   Concern for COPD Flare States that "it seems like every 2 weeks this happens".  Worsened again last night. He is using 2L of O2 now, but states that he doesn't normally need oxygen all the time. He is needing it more often. Exacerbated by exertion. Typically able to move more and go on walks, hasn't been able to. Does endorse PND. Awoke overnight last night and had to use neb treatments. No cough. States that sometimes he has cough and phlegm but not lately. Quit smoking back in over a year, previously smoked for 30-40 years.   Per chart review, last here 5/11 for COPD exacerbation, was given 5-days of Prednisone and 5-day course of Azithromycin.   Hypertension Reports good compliance with Amlodipine. No chest pain, lower extremity swelling, orthopnea.   PERTINENT  PMH / PSH:  Past Medical History:  Diagnosis Date   Asthma    COPD (chronic obstructive pulmonary disease) ([facility])    Hypertension    Shortness of breath    OBJECTIVE:   BP 140/70   Pulse 87   Wt 153 lb 6.4 oz (69.6 kg)   SpO2 100%   BMI 22.65 kg/m   Vitals:   04/14/22 1527 04/14/22 1602  BP: (!) 172/147 140/70  Pulse: 87   SpO2: 100%    General: NAD, pleasant, able to participate in exam Cardiac: RRR Respiratory: Decreased air sounds in all lobes, end expiratory wheezing in the right lower lobe.  No rales. On 2L [place] Extremities: no edema or cyanosis. Skin: warm and dry, no rashes noted Psych: Normal affect and mood  ASSESSMENT/PLAN:   Dyspnea Intermittent dyspnea on exertion.  He has been seen several times in the last couple months for concerns of COPD exacerbation.  Given that he has not having any cough or increased sputum production I feel this is less likely today.  He is satting 100% on 2 L nasal cannula.  Ambulatory pulse ox unremarkable, without need for increased oxygen support.  I have encouraged him to make an appointment with his pulmonologist due to his  ongoing symptoms.  He does seem compliant with his medications.  Previous chest x-ray earlier this month was unremarkable.  We will plan for echo to further evaluate cardiac etiologies, possible pulmonary hypertension, which would change management. Low concern for PE, not tachycardic and without other risk factors.  Low concern for pneumonia as he has been treated several times with antibiotics and has had no fevers.   -Echo -Strict return precautions given -Will do short course of Prednisone (3-days) -Hold abx for now -Encouraged Pulm f/u  Primary hypertension Significantly elevated on initial check, largely improved on subsequent check.  Will not adjust any medications at this time.     Sharion Settler, [place]

## 2022-04-14 NOTE — Assessment & Plan Note (Addendum)
Intermittent dyspnea on exertion.  He has been seen several times in the last couple months for concerns of COPD exacerbation.  Given that he has not having any cough or increased sputum production I feel this is less likely today.  He is satting 100% on 2 L nasal cannula.  Ambulatory pulse ox unremarkable, without need for increased oxygen support.  I have encouraged him to make an appointment with his pulmonologist due to his ongoing symptoms.  He does seem compliant with his medications.  Previous chest x-ray earlier this month was unremarkable.  We will plan for echo to further evaluate cardiac etiologies, possible pulmonary hypertension, which would change management. Low concern for PE, not tachycardic and without other risk factors.  Low concern for pneumonia as he has been treated several times with antibiotics and has had no fevers.   -Echo -Strict return precautions given -Will do short course of Prednisone (3-days) -Hold abx for now -Encouraged Pulm f/u

## 2022-04-14 NOTE — Assessment & Plan Note (Signed)
Significantly elevated on initial check, largely improved on subsequent check.  Will not adjust any medications at this time.

## 2022-04-18 ENCOUNTER — Other Ambulatory Visit: Payer: Self-pay | Admitting: Student

## 2022-04-18 MED ORDER — ROSUVASTATIN CALCIUM 20 MG PO TABS: 20.0000 mg | ORAL_TABLET | Freq: Every day | ORAL | 0 refills | Status: DC

## 2022-04-27 ENCOUNTER — Ambulatory Visit (HOSPITAL_COMMUNITY)
Admission: EM | Admit: 2022-04-27 | Discharge: 2022-04-27 | Disposition: A | Discharge status: Home / Self Care | Payer: Medicare Other | Attending: Family Medicine | Admitting: Family Medicine

## 2022-04-27 ENCOUNTER — Ambulatory Visit (INDEPENDENT_AMBULATORY_CARE_PROVIDER_SITE_OTHER): Payer: Medicare Other

## 2022-04-27 ENCOUNTER — Encounter (HOSPITAL_COMMUNITY): Payer: Self-pay | Admitting: Emergency Medicine

## 2022-04-27 DIAGNOSIS — J441 Chronic obstructive pulmonary disease with (acute) exacerbation: Secondary | ICD-10-CM | POA: Diagnosis not present

## 2022-04-27 DIAGNOSIS — R0602 Shortness of breath: Secondary | ICD-10-CM

## 2022-04-27 MED ORDER — PREDNISONE 10 MG (48) PO TBPK: ORAL_TABLET | ORAL | 0 refills | Status: DC

## 2022-04-27 NOTE — ED Triage Notes (Signed)
Pt reports SOB x 4 days. States the COPD flare up started after taking a new medication (Rosuvastatin 20 mg) prescribed. States he stopped taking the medication. Also reports oxygen (2L) use the last 4 days and states he doesn't normally have to wear oxygen.

## 2022-04-28 NOTE — ED Provider Notes (Signed)
[place]   [id number] 04/27/22 Arrival Time: 5631  ASSESSMENT & PLAN:  1. COPD exacerbation ([place])    I have personally viewed the imaging studies ordered this visit. No acute changes on CXR.  No resp distress. BP (!) 158/84 (BP Location: Right Arm)   Pulse 87   Temp 97.9 F (36.6 C) (Oral)   Resp 20   SpO2 98%   Has been on a couple courses of antibiotics recently. Will hold. Begin: Meds ordered this encounter  Medications   predniSONE (STERAPRED UNI-PAK 48 TAB) 10 MG (48) TBPK tablet    Sig: Take as directed.    Dispense:  48 tablet    Refill:  0   OTC symptom care as needed.  Recommend:  Follow-up Information     Schedule an appointment as soon as possible for a visit  with [name], MD.   Specialty: Family Medicine Contact information: [place] [id number] [phone]         [place].   Specialty: Emergency Medicine Why: If symptoms worsen in any way. Contact information: [address] [id number] [place] [phone]                Reviewed expectations re: course of current medical issues. Questions answered. Outlined signs and symptoms indicating need for more acute intervention. Patient verbalized understanding. After Visit Summary given.  SUBJECTIVE: History from: patient.  [name] is a 66 y.o. male who presents with complaint of intermittent coughing and wheezing. With occas SOB; Similar symptoms with COPD exacerbations. Finished Zithromax recently. Afebrile. No assoc CP. Social History   Tobacco Use  Smoking Status Former   Packs/day: 0.50   Years: 30.00   Total pack years: 15.00   Types: Cigarettes   Quit date: 08/25/2020   Years since quitting: 1.6  Smokeless Tobacco Never   No LE edema. Ambulatory.  OBJECTIVE:  Vitals:   04/27/22 1700  BP: (!) 158/84  Pulse: 87  Resp: 20  Temp: 97.9 F (36.6  C)  TempSrc: Oral  SpO2: 98%     General appearance: alert; NAD HEENT: [place]; AT; with mild nasal congestion Neck: supple without LAD CV: reg Lungs: unlabored respirations, moderate bilateral expiratory wheezing; cough: mild and non-prod; no significant respiratory distress Skin: warm and dry Psychological: alert and cooperative; normal mood and affect  Imaging: DG Chest 2 View  Result Date: 04/27/2022 CLINICAL DATA:  Shortness of breath x4 days EXAM: CHEST - 2 VIEW COMPARISON:  03/29/2022 FINDINGS: Cardiac size is within normal limits. Increase in AP diameter of chest suggests COPD. There are no signs of pulmonary edema or focal pulmonary consolidation. Linear densities are noted in the left parahilar region and medial right lower lung fields suggesting subsegmental atelectasis. There is no pleural effusion or pneumothorax. IMPRESSION: There are no signs of pulmonary edema or focal pulmonary consolidation. COPD. There are linear densities overlying the left hilum and medial right lower lung fields suggesting subsegmental atelectasis. Electronically Signed   By: Elmer Picker M.D.   On: 04/27/2022 17:15    Allergies  Allergen Reactions   Lisinopril Swelling   Spiriva Respimat [Tiotropium Bromide Monohydrate] Other (See Comments)    Red, glassy eyes   Tiotropium Other (See Comments)    Red glassy eyes     Past Medical History:  Diagnosis Date   Asthma    COPD (chronic obstructive pulmonary disease) ([place])    Hypertension  Shortness of breath    Family History  Problem Relation Age of Onset   Cancer Father    Social History   Socioeconomic History   Marital status: Married    Spouse name: Not on file   Number of children: Not on file   Years of education: Not on file   Highest education level: Not on file  Occupational History   Not on file  Tobacco Use   Smoking status: Former    Packs/day: 0.50    Years: 30.00    Total pack years: 15.00    Types: Cigarettes     Quit date: 08/25/2020    Years since quitting: 1.6   Smokeless tobacco: Never  Vaping Use   Vaping Use: Never used  Substance and Sexual Activity   Alcohol use: Yes    Comment: rare/occasional   Drug use: Not Currently   Sexual activity: Yes    Partners: Female  Other Topics Concern   Not on file  Social History Narrative   Not on file   Social Determinants of Health   Financial Resource Strain: Not on file  Food Insecurity: Not on file  Transportation Needs: Not on file  Physical Activity: Not on file  Stress: Not on file  Social Connections: Not on file  Intimate Partner Violence: Not on file             [place], MD 04/28/22 [phone]

## 2022-05-14 NOTE — Patient Instructions (Signed)
It was great to see you! Thank you for allowing me to participate in your care!  I recommend that you always bring your medications to each appointment as this makes it easy to ensure we are on the correct medications and helps [place] not miss when refills are needed.  Our plans for today:  - High Blood Pressure: we are going to recheck your blood pressure in 2-3 weeks, after you have been off your prednisone - Lipoma: I will contact you about follow up - Cholesterol: We will start you on lower dose of medicine. Give it 1 week and see if symptoms of shortness of breath, do not get better. Call clinic about switching meds, if this continues to  be an issue.  - Colonoscopy  Take care and seek immediate care sooner if you develop any concerns.   Dr. [name], MD [place]

## 2022-05-14 NOTE — Progress Notes (Signed)
SUBJECTIVE:   CHIEF COMPLAINT / HPI:   COPD: COPD has been well controlled. Patient feels like he can do what he want's without breathing issues. Has been on prednisone 20 mg for the last 30 days. He was started at 40 mg, now taking 20 mg, has last dose today. Using the nebulizer, just for the routine, not for symptoms. Stopped smoking a little over a year ago. Saw pulmonologist in January.   HTN Vitals:   05/20/22 0909  BP: (!) 153/66  Pulse: 83  SpO2: 98%   BP Readings from Last 3 Encounters:  05/20/22 (!) 153/66  04/27/22 (!) 158/84  04/14/22 140/70  Meds: Amlodipine 10, Chlorthalidone 25 mg Compliance: taking meds daily Symptoms: No chest pain, SOB  HLD Lab Results  Component Value Date   CHOL 221 (H) 03/31/2022   HDL 59 03/31/2022   LDLCALC 142 (H) 03/31/2022   TRIG 110 03/31/2022   CHOLHDL 3.7 03/31/2022  Meds: Crestor 20 mg Compliance: Stopped taking the medicine, because it caused SOB. Medicine was started at previous visit  Screening: Colonoscopy - patient agreeable  AAA w/ ultrasound - patient agreeable  Lung Cx Low-dose CT: following with Pulm, has one scheduled.   PERTINENT  PMH / PSH: HTN, HLD, COPD    OBJECTIVE:  BP (!) 153/66   Pulse 83   Ht '5\' 9"'$  (1.753 m)   Wt 158 lb 6 oz (71.8 kg)   SpO2 98%   BMI 23.39 kg/m   General: NAD, pleasant, able to participate in exam Cardiac: RRR, no murmurs auscultated. Respiratory: CTAB, normal effort, no wheezes, rales or rhonchi Abdomen: firm, non-tender, non-distended, Extremities: warm and well perfused, no edema or cyanosis. Skin: warm and dry, no rashes noted Neuro: alert, no obvious focal deficits, speech normal Psych: Normal affect and mood  ASSESSMENT/PLAN:  Primary hypertension Patient BP continues to be elevated today, 150's/60's. Patient taking BP meds but has been on prednisone for last 30 days. Will recheck BP in 2 weeks, after being off of prednisone (last dose today), to determine need for  3rd antihypertensive.  -Follow up in 2 weeks -Continue amlodipine and chlorthalidone  COPD (chronic obstructive pulmonary disease) ([place]) COPD well controlled today. Patient has been following up with pulmonology and continues to take her home Trellergy. Patient has been on prednisone for last 30 days for COPD exacerbation. Patient seen by pulmonology in January.  -Continue trellergy  Pulmonary nodule 1 cm or greater in diameter Has been stable in previous imaging. Patient has CT scan scheduled 06/02/22, is following with Pulmonology.    Hyperlipidemia Patient was noted to have elevated T. Chol 221, and LDL 142 and started on Crestor. Patient reports Crestor 20 mg gave him SOB, and he d/c. Patient willing to retry at lower dose.  -Crestor 10 mg -Will consider atorvastatin if Crestor not working   Orders Placed This Encounter  Procedures   [facility] MEDICARE SCREENING    Standing Status:   Future    Standing Expiration Date:   05/21/2023    Order Specific Question:   Reason for Exam (SYMPTOM  OR DIAGNOSIS REQUIRED)    Answer:   AAA screning    Order Specific Question:   Preferred imaging location?    Answer:   [facility]   Ambulatory referral to Gastroenterology    Referral Priority:   Routine    Referral Type:   Consultation    Referral Reason:   Specialty Services Required    Number of Visits  Requested:   1   Meds ordered this encounter  Medications   rosuvastatin (CRESTOR) 20 MG tablet    Sig: Take 0.5 tablets (10 mg total) by mouth daily.    Dispense:  90 tablet    Refill:  0   No follow-ups on file. '@SIGNNOTE'$ @

## 2022-05-20 ENCOUNTER — Ambulatory Visit (INDEPENDENT_AMBULATORY_CARE_PROVIDER_SITE_OTHER): Payer: Medicare Other | Admitting: Student

## 2022-05-20 ENCOUNTER — Encounter: Payer: Self-pay | Admitting: Student

## 2022-05-20 VITALS — BP 153/66 | HR 83 | Ht 69.0 in | Wt 158.4 lb

## 2022-05-20 DIAGNOSIS — Z1211 Encounter for screening for malignant neoplasm of colon: Secondary | ICD-10-CM

## 2022-05-20 DIAGNOSIS — J449 Chronic obstructive pulmonary disease, unspecified: Secondary | ICD-10-CM | POA: Diagnosis not present

## 2022-05-20 DIAGNOSIS — R911 Solitary pulmonary nodule: Secondary | ICD-10-CM | POA: Diagnosis not present

## 2022-05-20 DIAGNOSIS — Z136 Encounter for screening for cardiovascular disorders: Secondary | ICD-10-CM

## 2022-05-20 DIAGNOSIS — E785 Hyperlipidemia, unspecified: Secondary | ICD-10-CM | POA: Insufficient documentation

## 2022-05-20 DIAGNOSIS — I10 Essential (primary) hypertension: Secondary | ICD-10-CM

## 2022-05-20 MED ORDER — ROSUVASTATIN CALCIUM 20 MG PO TABS: 10.0000 mg | ORAL_TABLET | Freq: Every day | ORAL | 0 refills | Status: DC

## 2022-05-20 NOTE — Assessment & Plan Note (Signed)
Patient was noted to have elevated T. Chol 221, and LDL 142 and started on Crestor. Patient reports Crestor 20 mg gave him SOB, and he d/c. Patient willing to retry at lower dose.  -Crestor 10 mg -Will consider atorvastatin if Crestor not working

## 2022-05-20 NOTE — Assessment & Plan Note (Signed)
Patient BP continues to be elevated today, 150's/60's. Patient taking BP meds but has been on prednisone for last 30 days. Will recheck BP in 2 weeks, after being off of prednisone (last dose today), to determine need for 3rd antihypertensive.  -Follow up in 2 weeks -Continue amlodipine and chlorthalidone

## 2022-05-20 NOTE — Assessment & Plan Note (Signed)
COPD well controlled today. Patient has been following up with pulmonology and continues to take her home Trellergy. Patient has been on prednisone for last 30 days for COPD exacerbation. Patient seen by pulmonology in January.  -Continue trellergy

## 2022-05-20 NOTE — Assessment & Plan Note (Signed)
Has been stable in previous imaging. Patient has CT scan scheduled 06/02/22, is following with Pulmonology.

## 2022-06-01 ENCOUNTER — Ambulatory Visit: Payer: Medicare Other

## 2022-06-02 ENCOUNTER — Telehealth: Payer: Self-pay | Admitting: Pulmonary Disease

## 2022-06-02 ENCOUNTER — Ambulatory Visit (HOSPITAL_COMMUNITY)
Admission: EM | Admit: 2022-06-02 | Discharge: 2022-06-02 | Disposition: A | Discharge status: Home / Self Care | Payer: Medicare Other

## 2022-06-02 ENCOUNTER — Encounter (HOSPITAL_COMMUNITY): Payer: Self-pay

## 2022-06-02 ENCOUNTER — Ambulatory Visit (HOSPITAL_COMMUNITY)
Admission: RE | Admit: 2022-06-02 | Discharge: 2022-06-02 | Disposition: A | Discharge status: Home / Self Care | Payer: Medicare Other | Source: Ambulatory Visit | Attending: Pulmonary Disease | Admitting: Pulmonary Disease

## 2022-06-02 DIAGNOSIS — R0609 Other forms of dyspnea: Secondary | ICD-10-CM | POA: Diagnosis not present

## 2022-06-02 DIAGNOSIS — R0602 Shortness of breath: Secondary | ICD-10-CM

## 2022-06-02 DIAGNOSIS — J441 Chronic obstructive pulmonary disease with (acute) exacerbation: Secondary | ICD-10-CM | POA: Diagnosis not present

## 2022-06-02 DIAGNOSIS — R911 Solitary pulmonary nodule: Secondary | ICD-10-CM | POA: Diagnosis present

## 2022-06-02 NOTE — ED Triage Notes (Signed)
Patient having SOB. Ongoing even with portable oxygen. States he can not even go from one room to the other without his oxygen. SOB better with the oxygen but still there. Productive cough with clear mucus, no chest pain, no dizziness/lightheadedness, no blurred vision.   Patient on 2L of oxygen. Patient has COPD. Used his inhaler and breathing treatment this morning around 9 am.   Patient states nothing helps but steroid medication.

## 2022-06-02 NOTE — ED Provider Notes (Signed)
[place]    CSN: [id number] Arrival date & time: 06/02/22  1125      History   Chief Complaint Chief Complaint  Patient presents with   Shortness of Breath   Cough    HPI [name] is a 66 y.o. male.   Patient presents to urgent care for evaluation of shortness of breath and cough over the last couple of days after finishing a long course of prednisone taper for COPD exacerbation 1 week ago.  Patient was seen on April 27, 2022 at urgent care and given a month-long prednisone taper for COPD exacerbation.  Patient wears oxygen as needed for shortness of breath and he states that he has had to wear oxygen around-the-clock since 2 days ago.  He has been using his DuoNeb treatments and albuterol breathing treatments/inhaler "countless times" at home for his symptoms.  He uses Trelegy daily and is followed by pulmonology for management of his COPD.  Patient states that when he is using steroid medication, he is "normal" and does not have to use his oxygen/does not have to use his breathing treatments regularly. Patient does not like to wear his oxygen since it is not as easy to do the things that bring him enjoyment like washing his car, fishing, etc. He states he feels "like himself" while on the steroid medication. Patient denies hemoptysis, nausea, vomiting, abdominal pain, fever/chills, back pain, recent falls/injuries, chest pain, and dizziness. He is a former smoker and quit smoking 1 year ago.    Shortness of Breath Associated symptoms: cough   Cough Associated symptoms: shortness of breath     Past Medical History:  Diagnosis Date   Asthma    COPD (chronic obstructive pulmonary disease) ([facility])    Hypertension    Shortness of breath     Patient Active Problem List   Diagnosis Date Noted   Hyperlipidemia 05/20/2022   Dyspnea 04/14/2022   Allergies 02/15/2022   Lipoma of back 10/19/2021   Chronic respiratory failure with hypoxia ([place]) 04/23/2021   AKI  (acute kidney injury) ([place]) 04/05/2021   COPD exacerbation ([place]) 03/27/2021   Health care maintenance 08/08/2020   Pulmonary nodule 1 cm or greater in diameter 03/19/2019   Primary hypertension    History of tobacco use 02/06/2014   Erectile dysfunction 12/31/2008   COPD (chronic obstructive pulmonary disease) ([place]) 12/31/2008   History of colonic polyps 01/18/2007    Past Surgical History:  Procedure Laterality Date   COLONOSCOPY         Home Medications    Prior to Admission medications   Medication Sig Start Date End Date Taking? Authorizing Provider  albuterol (VENTOLIN HFA) 108 (90 Base) MCG/ACT inhaler INHALE 2 PUFFS EVERY 6 HOURS AS NEEDED FOR WHEEZING OR SHORTNESS OF BREATH 10/19/21  Yes Alcus Dad, MD  amLODipine (NORVASC) 10 MG tablet Take 1 tablet (10 mg total) by mouth daily. 10/19/21  Yes Alcus Dad, MD  cetirizine (ZYRTEC) 10 MG tablet Take 1 tablet (10 mg total) by mouth daily. 02/15/22  Yes Sowell, Erlene Quan, MD  fluticasone (FLONASE) 50 MCG/ACT nasal spray Place 2 sprays into both nostrils daily. 1 spray in each nostril every day 02/15/22  Yes [name], MD  Fluticasone-Umeclidin-Vilant (TRELEGY ELLIPTA) 100-62.5-25 MCG/ACT AEPB TAKE 1 PUFF BY MOUTH EVERY DAY 03/31/22  Yes Eppie Gibson, MD  ipratropium-albuterol (DUONEB) 0.5-2.5 (3) MG/3ML SOLN INHALE 1 VIAL VIA NEBULIZER EVERY 6 HOURS AS NEEDED FOR SHORTNESS OF BREATH /  WHEEZING 03/31/22  Yes Eppie Gibson, MD  predniSONE (STERAPRED UNI-PAK 48 TAB) 10 MG (48) TBPK tablet Take as directed. 04/27/22  Yes Hagler, Aaron Edelman, MD  acetaminophen (TYLENOL) 500 MG tablet Take 1,000 mg by mouth as needed for mild pain. Patient not taking: Reported on 04/14/2022    [provider]  chlorthalidone (HYGROTON) 25 MG tablet Take 1 tablet (25 mg total) by mouth daily. Patient not taking: Reported on 04/14/2022 01/11/21   Mina Marble P, DO  diclofenac Sodium (VOLTAREN) 1 % GEL Apply 2 g topically 4 (four)  times daily. Patient not taking: Reported on 04/14/2022 10/20/21   Carvel Getting, NP  rosuvastatin (CRESTOR) 20 MG tablet Take 0.5 tablets (10 mg total) by mouth daily. 05/20/22   [name], MD    Family History Family History  Problem Relation Age of Onset   Cancer Father     Social History Social History   Tobacco Use   Smoking status: Former    Packs/day: 0.50    Years: 30.00    Total pack years: 15.00    Types: Cigarettes    Quit date: 08/25/2020    Years since quitting: 1.7   Smokeless tobacco: Never  Vaping Use   Vaping Use: Never used  Substance Use Topics   Alcohol use: Yes    Comment: rare/occasional   Drug use: Not Currently     Allergies   Lisinopril, Spiriva respimat [tiotropium bromide monohydrate], and Tiotropium   Review of Systems Review of Systems  Respiratory:  Positive for cough and shortness of breath.   Per HPI   Physical Exam Triage Vital Signs ED Triage Vitals  Enc Vitals Group     BP 06/02/22 1132 (!) 151/84     Pulse Rate 06/02/22 1132 82     Resp --      Temp 06/02/22 1132 (!) 97.5 F (36.4 C)     Temp Source 06/02/22 1132 Oral     SpO2 06/02/22 1132 99 %     Weight 06/02/22 1134 158 lb 9.6 oz (71.9 kg)     Height 06/02/22 1134 '5\' 9"'$  (1.753 m)     Head Circumference --      Peak Flow --      Pain Score 06/02/22 1134 0     Pain Loc --      Pain Edu? --      Excl. in [place]? --    No data found.  Updated Vital Signs BP (!) 151/84 (BP Location: Right Arm)   Pulse 82   Temp (!) 97.5 F (36.4 C) (Oral)   Ht '5\' 9"'$  (1.753 m)   Wt 158 lb 9.6 oz (71.9 kg)   SpO2 99%   BMI 23.42 kg/m   Visual Acuity Right Eye Distance:   Left Eye Distance:   Bilateral Distance:    Right Eye Near:   Left Eye Near:    Bilateral Near:     Physical Exam Vitals and nursing note reviewed.  Constitutional:      General: He is not in acute distress.    Appearance: Normal appearance. He is normal weight. He is not ill-appearing or  toxic-appearing.  HENT:     Head: Normocephalic and atraumatic.     Right Ear: Hearing and external ear normal.     Left Ear: Hearing and external ear normal.     Nose: Nose normal.     Mouth/Throat:     Lips: Pink.     Mouth: Mucous membranes are  moist.  Eyes:     General: Lids are normal. Vision grossly intact. Gaze aligned appropriately.     Extraocular Movements: Extraocular movements intact.     Conjunctiva/sclera: Conjunctivae normal.  Cardiovascular:     Rate and Rhythm: Normal rate and regular rhythm.     Heart sounds: Normal heart sounds, S1 normal and S2 normal.  Pulmonary:     Effort: Pulmonary effort is normal. No tachypnea, accessory muscle usage or retractions.     Breath sounds: Normal air entry. No stridor. Examination of the right-upper field reveals decreased breath sounds. Examination of the left-upper field reveals decreased breath sounds. Examination of the right-middle field reveals decreased breath sounds. Examination of the left-middle field reveals decreased breath sounds. Examination of the right-lower field reveals decreased breath sounds. Examination of the left-lower field reveals decreased breath sounds. Decreased breath sounds present. No wheezing, rhonchi or rales.     Comments: Respiratory rate 20. No wheezes, rhonchi, or rales heard to auscultation. Decreased breath sounds heard throughout. No acute respiratory distress at time of exam.  Abdominal:     Palpations: Abdomen is soft.  Musculoskeletal:     Cervical back: Neck supple. No tenderness.     Right lower leg: No edema.     Left lower leg: No edema.     Comments: 5/5 power to bilateral upper and lower extremities.   Lymphadenopathy:     Cervical: No cervical adenopathy.  Skin:    General: Skin is warm and dry.     Capillary Refill: Capillary refill takes less than 2 seconds.     Findings: No rash.     Nails: There is no clubbing.  Neurological:     General: No focal deficit present.      Mental Status: He is alert and oriented to person, place, and time. Mental status is at baseline.     Cranial Nerves: No dysarthria or facial asymmetry.     Motor: Motor function is intact.     Gait: Gait is intact.  Psychiatric:        Mood and Affect: Mood normal.        Speech: Speech normal.        Behavior: Behavior normal.        Thought Content: Thought content normal.        Judgment: Judgment normal.      UC Treatments / Results  Labs (all labs ordered are listed, but only abnormal results are displayed) Labs Reviewed - No data to display  EKG   Radiology No results found.  Procedures Procedures (including critical care time)  Medications Ordered in UC Medications - No data to display  Initial Impression / Assessment and Plan / UC Course  I have reviewed the triage vital signs and the nursing notes.  Pertinent labs & imaging results that were available during my care of the patient were reviewed by me and considered in my medical decision making (see chart for details).   1. Shortness of breath Shortness of breath is likely related to intermittent oxygen use and COPD exacerbation. Deferred imaging based on stable cardiopulmonary exam and vital signs at time of visit. No indication for albuterol or duoneb treatment in clinic due to clear lung sounds and stable clinical appearance. Patient able to ambulate around the urgent care more than 6 meters without oxygen with lowest oxygen saturation level of 92%. Patient became winded and after this and oxygen was placed back onto patient in timely manner with improvement  of shortness of breath symptoms back to baseline. Recommend patient wear oxygen 2L all the time to prevent shortness of breath episodes. He is to continue use of Trelegy daily and albuterol/duoneb as needed. No indication for another course of steroids due to stable pulse oximetry with ambulation and clinical appearance. Discussed these findings with patient who  verbalizes understanding and agreement with plan to use continuous oxygen at 2L nasal cannula and follow-up with pulmonologist. He is due for a follow-up appointment with pulmonologist as it has been over 6 months since his last visit in January 2023.   Discussed physical exam and available lab work findings in clinic with patient.  Counseled patient regarding appropriate use of medications and potential side effects for all medications recommended or prescribed today. Discussed red flag signs and symptoms of worsening condition,when to call the PCP office, return to urgent care, and when to seek higher level of care in the emergency department. Patient verbalizes understanding and agreement with plan. All questions answered. Patient discharged in stable condition.  Final Clinical Impressions(s) / UC Diagnoses   Final diagnoses:  Shortness of breath  Dyspnea on exertion     Discharge Instructions      Continue to use your Trelegy.  Continue wearing your oxygen at 2 L every day.  If it is very hot and humid outside, please avoid going outside and doing outdoor activities as this can make her breathing worse.  Return to your pulmonologist as scheduled.  If you develop any new or worsening symptoms over the next 3 to 4 days, please return.  If you have severe symptoms, please go to the emergency room for evaluation. I hope you feel better!      ED Prescriptions   None    PDMP not reviewed this encounter.   Talbot Grumbling, [place] 06/05/22 1022

## 2022-06-02 NOTE — ED Notes (Signed)
Pt ambulated around department without wearing supplemental oxygen. O2 saturation dropped to 92-93%. Pt became very winded with ambulation. Pt was placed back on O2 via nasal canula after returning back to room O2 saturation went up to 95%

## 2022-06-02 NOTE — Telephone Encounter (Signed)
Pt will need to be seen for a visit due to last OV being 11/2021.   Called and spoke with pt who stated he began to have increased SOB 2 days ago. States that he also has been wearing his O2 to see if that would help with his breathing.  Pt's spouse Corliss Skains then got on the phone and I stated to her that we needed to get pt in for an appt due to last visit that pt had at office and she verbalized understanding. Appt has been scheduled for pt. Nothing further needed.

## 2022-06-02 NOTE — Discharge Instructions (Addendum)
Continue to use your Trelegy.  Continue wearing your oxygen at 2 L every day.  If it is very hot and humid outside, please avoid going outside and doing outdoor activities as this can make her breathing worse.  Return to your pulmonologist as scheduled.  If you develop any new or worsening symptoms over the next 3 to 4 days, please return.  If you have severe symptoms, please go to the emergency room for evaluation. I hope you feel better!

## 2022-06-03 ENCOUNTER — Encounter: Payer: Self-pay | Admitting: Pulmonary Disease

## 2022-06-03 ENCOUNTER — Ambulatory Visit (INDEPENDENT_AMBULATORY_CARE_PROVIDER_SITE_OTHER): Payer: Medicare Other | Admitting: Pulmonary Disease

## 2022-06-03 ENCOUNTER — Ambulatory Visit
Admission: RE | Admit: 2022-06-03 | Discharge: 2022-06-03 | Disposition: A | Discharge status: Home / Self Care | Payer: Medicare Other | Source: Ambulatory Visit | Attending: Family Medicine | Admitting: Family Medicine

## 2022-06-03 VITALS — BP 136/84 | HR 78 | Ht 69.0 in | Wt 156.3 lb

## 2022-06-03 DIAGNOSIS — Z136 Encounter for screening for cardiovascular disorders: Secondary | ICD-10-CM

## 2022-06-03 DIAGNOSIS — J441 Chronic obstructive pulmonary disease with (acute) exacerbation: Secondary | ICD-10-CM | POA: Diagnosis not present

## 2022-06-03 MED ORDER — PREDNISONE 10 MG PO TABS: ORAL_TABLET | ORAL | 0 refills | Status: AC

## 2022-06-03 MED ORDER — AZITHROMYCIN 250 MG PO TABS: ORAL_TABLET | ORAL | 0 refills | Status: DC

## 2022-06-03 NOTE — Patient Instructions (Addendum)
You appear to have exacerbation of your COPD.  Start prednisone taper  Start Zpak antibiotic  Continue trelegy ellipta 1 puff daily  Return in 6 weeks for updated pulmonary function tests and lab testing with a nurse practitioner  Follow up with Dr. [name] as scheduled

## 2022-06-03 NOTE — Progress Notes (Unsigned)
Synopsis: Referred in July 2023 for acute visit, shortness of breath. Followed by Dr. [name]  Subjective:   PATIENT ID: [name] GENDER: male DOB: 11-Nov-1956, MRN: [id number]  HPI  Chief Complaint  Patient presents with   Acute Visit    Increased wheezing and SOB for the past 3-4 days. Productive cough with clear phlegm.    [name] is a 66 year old male, recent former smoker with COPD and nocturnal hypoxemia who returns to pulmonary clinic for acute visit due to dyspnea.   He went to urgent care yesterday for shortness of breath. He was seen at urgent care 04/27/22, 03/29/22, 03/12/22 and 01/16/22 all for shortness of breath. Last seen by Dr. [name] 11/2021.  Increased shortness of breath over the past couple of days. He has a cough that is productive of clear sputum. He has been sneezing more often recently. He has been wheezing.   PFTs 2015 showed FVC 2.85L (79%) and FEV1 1.66L (57%).   CT Chest 06/02/22 for nodule follow up showed stable RUL apical nodule and new RML curvilinear parenchymal band. Moderate centrilobular emphysema.   Past Medical History:  Diagnosis Date   Asthma    COPD (chronic obstructive pulmonary disease) (HCC)    Hypertension    Shortness of breath      Family History  Problem Relation Age of Onset   Cancer Father      Social History   Socioeconomic History   Marital status: Married    Spouse name: Not on file   Number of children: Not on file   Years of education: Not on file   Highest education level: Not on file  Occupational History   Not on file  Tobacco Use   Smoking status: Former    Packs/day: 0.50    Years: 30.00    Total pack years: 15.00    Types: Cigarettes    Quit date: 08/25/2020    Years since quitting: 1.7   Smokeless tobacco: Never  Vaping Use   Vaping Use: Never used  Substance and Sexual Activity   Alcohol use: Yes    Comment: rare/occasional   Drug use: Not Currently   Sexual activity: Yes    Partners: Female   Other Topics Concern   Not on file  Social History Narrative   Not on file   Social Determinants of Health   Financial Resource Strain: Not on file  Food Insecurity: Not on file  Transportation Needs: Not on file  Physical Activity: Not on file  Stress: Not on file  Social Connections: Not on file  Intimate Partner Violence: Not on file     Allergies  Allergen Reactions   Lisinopril Swelling   Spiriva Respimat [Tiotropium Bromide Monohydrate] Other (See Comments)    Red, glassy eyes   Tiotropium Other (See Comments)    Red glassy eyes      Outpatient Medications Prior to Visit  Medication Sig Dispense Refill   acetaminophen (TYLENOL) 500 MG tablet Take 1,000 mg by mouth as needed for mild pain.     albuterol (VENTOLIN HFA) 108 (90 Base) MCG/ACT inhaler INHALE 2 PUFFS EVERY 6 HOURS AS NEEDED FOR WHEEZING OR SHORTNESS OF BREATH 18 g 1   amLODipine (NORVASC) 10 MG tablet Take 1 tablet (10 mg total) by mouth daily. 90 tablet 3   cetirizine (ZYRTEC) 10 MG tablet Take 1 tablet (10 mg total) by mouth daily. 30 tablet 11   chlorthalidone (HYGROTON) 25 MG tablet Take 1  tablet (25 mg total) by mouth daily. 90 tablet 3   diclofenac Sodium (VOLTAREN) 1 % GEL Apply 2 g topically 4 (four) times daily. 150 g 0   fluticasone (FLONASE) 50 MCG/ACT nasal spray Place 2 sprays into both nostrils daily. 1 spray in each nostril every day 16 g 12   Fluticasone-Umeclidin-Vilant (TRELEGY ELLIPTA) 100-62.5-25 MCG/ACT AEPB TAKE 1 PUFF BY MOUTH EVERY DAY 60 each 5   ipratropium-albuterol (DUONEB) 0.5-2.5 (3) MG/3ML SOLN INHALE 1 VIAL VIA NEBULIZER EVERY 6 HOURS AS NEEDED FOR SHORTNESS OF BREATH /  WHEEZING 270 mL 11   rosuvastatin (CRESTOR) 20 MG tablet Take 0.5 tablets (10 mg total) by mouth daily. 90 tablet 0   predniSONE (STERAPRED UNI-PAK 48 TAB) 10 MG (48) TBPK tablet Take as directed. 48 tablet 0   No facility-administered medications prior to visit.    Review of Systems  Constitutional:   Negative for chills, fever, malaise/fatigue and weight loss.  HENT:  Negative for congestion, sinus pain and sore throat.   Eyes: Negative.   Respiratory:  Positive for cough, sputum production, shortness of breath and wheezing. Negative for hemoptysis.   Cardiovascular:  Negative for chest pain, palpitations, orthopnea, claudication and leg swelling.  Gastrointestinal:  Negative for abdominal pain, heartburn, nausea and vomiting.  Genitourinary: Negative.   Musculoskeletal:  Negative for joint pain and myalgias.  Skin:  Negative for rash.  Neurological:  Negative for weakness.  Endo/Heme/Allergies: Negative.   Psychiatric/Behavioral: Negative.     Objective:   Vitals:   06/03/22 1331  BP: 136/84  Pulse: 78  SpO2: 99%  Weight: 156 lb 4.8 oz (70.9 kg)  Height: '5\' 9"'$  (1.753 m)   Physical Exam Constitutional:      General: He is not in acute distress. HENT:     Head: Normocephalic and atraumatic.  Eyes:     Extraocular Movements: Extraocular movements intact.     Conjunctiva/sclera: Conjunctivae normal.     Pupils: Pupils are equal, round, and reactive to light.  Cardiovascular:     Rate and Rhythm: Normal rate and regular rhythm.     Pulses: Normal pulses.     Heart sounds: Normal heart sounds. No murmur heard. Pulmonary:     Breath sounds: Wheezing and rhonchi present.  Abdominal:     General: Bowel sounds are normal.     Palpations: Abdomen is soft.  Musculoskeletal:     Right lower leg: No edema.     Left lower leg: No edema.  Lymphadenopathy:     Cervical: No cervical adenopathy.  Skin:    General: Skin is warm and dry.  Neurological:     General: No focal deficit present.     Mental Status: He is alert.  Psychiatric:        Mood and Affect: Mood normal.        Behavior: Behavior normal.        Thought Content: Thought content normal.        Judgment: Judgment normal.    CBC    Component Value Date/Time   WBC 6.8 03/29/2022 2215   RBC 4.10 (L)  03/29/2022 2215   HGB 13.8 03/29/2022 2215   HCT 41.7 03/29/2022 2215   PLT 239 03/29/2022 2215   MCV 101.7 (H) 03/29/2022 2215   MCH 33.7 03/29/2022 2215   MCHC 33.1 03/29/2022 2215   RDW 12.6 03/29/2022 2215   LYMPHSABS 3.6 03/27/2021 0250   MONOABS 1.7 (H) 03/27/2021 0250   EOSABS 0.7 (H) 03/27/2021  0250   BASOSABS 0.0 03/27/2021 0250      Latest Ref Rng & Units 03/29/2022   10:15 PM 04/05/2021   11:29 AM 03/29/2021    4:54 AM  BMP  Glucose 70 - 99 mg/dL 78  73  89   BUN 8 - 23 mg/dL 18  35  33   Creatinine 0.61 - 1.24 mg/dL 1.28  1.47  1.70   BUN/Creat Ratio 10 - 24  24    Sodium 135 - 145 mmol/L 140  138  137   Potassium 3.5 - 5.1 mmol/L 4.5  4.0  4.0   Chloride 98 - 111 mmol/L 115  102  100   CO2 22 - 32 mmol/L '19  21  29   '$ Calcium 8.9 - 10.3 mg/dL 9.5  10.2  9.6    Chest imaging:  PFT:     No data to display          Labs:  Path:  Echo:  Heart Catheterization:  Assessment & Plan:   COPD exacerbation ([place]) - Plan: predniSONE (DELTASONE) 10 MG tablet, azithromycin (ZITHROMAX) 250 MG tablet, Pulmonary Function Test  Discussion: Madox Corkins is a 66 year old male, recent former smoker with COPD and nocturnal hypoxemia who returns to pulmonary clinic for acute visit due to dyspnea.   He appears to have acute exacerbation of COPD. We will treat with prolonged steroid taper and Zpak. He is to continue trellegy ellipta daily.  Given his frequent exacerbations requiring prednisone and elevated eosinophil count of 700 5/72022 he may have asthma-copd overlap syndrome. Recommend he have CBC with diff and IgE level checked once he has been off steroids for 30 days or more. He may benefit from biologic therapy.  Follow up with Dr. [name] as scheduled. He will need follow up of the new RML curvilinear parenchymal band given his smoking history.  Freda Jackson, MD [facility] Office: [phone]   Current Outpatient Medications:     acetaminophen (TYLENOL) 500 MG tablet, Take 1,000 mg by mouth as needed for mild pain., Disp: , Rfl:    albuterol (VENTOLIN HFA) 108 (90 Base) MCG/ACT inhaler, INHALE 2 PUFFS EVERY 6 HOURS AS NEEDED FOR WHEEZING OR SHORTNESS OF BREATH, Disp: 18 g, Rfl: 1   amLODipine (NORVASC) 10 MG tablet, Take 1 tablet (10 mg total) by mouth daily., Disp: 90 tablet, Rfl: 3   azithromycin (ZITHROMAX) 250 MG tablet, Take as directed, Disp: 6 tablet, Rfl: 0   cetirizine (ZYRTEC) 10 MG tablet, Take 1 tablet (10 mg total) by mouth daily., Disp: 30 tablet, Rfl: 11   chlorthalidone (HYGROTON) 25 MG tablet, Take 1 tablet (25 mg total) by mouth daily., Disp: 90 tablet, Rfl: 3   diclofenac Sodium (VOLTAREN) 1 % GEL, Apply 2 g topically 4 (four) times daily., Disp: 150 g, Rfl: 0   fluticasone (FLONASE) 50 MCG/ACT nasal spray, Place 2 sprays into both nostrils daily. 1 spray in each nostril every day, Disp: 16 g, Rfl: 12   Fluticasone-Umeclidin-Vilant (TRELEGY ELLIPTA) 100-62.5-25 MCG/ACT AEPB, TAKE 1 PUFF BY MOUTH EVERY DAY, Disp: 60 each, Rfl: 5   ipratropium-albuterol (DUONEB) 0.5-2.5 (3) MG/3ML SOLN, INHALE 1 VIAL VIA NEBULIZER EVERY 6 HOURS AS NEEDED FOR SHORTNESS OF BREATH /  WHEEZING, Disp: 270 mL, Rfl: 11   predniSONE (DELTASONE) 10 MG tablet, Take 4 tablets (40 mg total) by mouth daily with breakfast for 3 days, THEN 3 tablets (30 mg total) daily with breakfast for 3 days, THEN  2 tablets (20 mg total) daily with breakfast for 3 days, THEN 1 tablet (10 mg total) daily with breakfast for 3 days., Disp: 30 tablet, Rfl: 0   rosuvastatin (CRESTOR) 20 MG tablet, Take 0.5 tablets (10 mg total) by mouth daily., Disp: 90 tablet, Rfl: 0

## 2022-06-04 ENCOUNTER — Encounter: Payer: Self-pay | Admitting: Pulmonary Disease

## 2022-06-08 ENCOUNTER — Ambulatory Visit: Payer: Medicare Other | Admitting: Student

## 2022-06-08 ENCOUNTER — Encounter: Payer: Self-pay | Admitting: Student

## 2022-06-08 NOTE — Progress Notes (Deleted)
  SUBJECTIVE:   CHIEF COMPLAINT / HPI:   F/u BP  Patient recently seen by pulm and started on steroid taper for COPD exacerbation. Steroids to end on 25th  BP Readings from Last 3 Encounters:  06/03/22 136/84  06/02/22 (!) 151/84  05/20/22 (!) 153/66  Meds: Amlodipine 10, Chlorthalidone 25 Compliance: Symptoms:   PERTINENT  PMH / PSH: COPD, HTN  Past Medical History:  Diagnosis Date   Asthma    COPD (chronic obstructive pulmonary disease) ([place])    Hypertension    Shortness of breath     Past Surgical History:  Procedure Laterality Date   COLONOSCOPY      OBJECTIVE:  There were no vitals taken for this visit.  General: NAD, pleasant, able to participate in exam Cardiac: RRR, no murmurs auscultated. Respiratory: CTAB, normal effort, no wheezes, rales or rhonchi Abdomen: soft, non-tender, non-distended, normoactive bowel sounds Extremities: warm and well perfused, no edema or cyanosis. Skin: warm and dry, no rashes noted Neuro: alert, no obvious focal deficits, speech normal Psych: Normal affect and mood  ASSESSMENT/PLAN:  No problem-specific Assessment & Plan notes found for this encounter.   No orders of the defined types were placed in this encounter.  No orders of the defined types were placed in this encounter.  No follow-ups on file. '@SIGNNOTE'$ @ {    This will disappear when note is signed, click to select method of visit    :1}

## 2022-07-02 ENCOUNTER — Other Ambulatory Visit: Payer: Self-pay

## 2022-07-02 DIAGNOSIS — J449 Chronic obstructive pulmonary disease, unspecified: Secondary | ICD-10-CM

## 2022-07-03 MED ORDER — ALBUTEROL SULFATE HFA 108 (90 BASE) MCG/ACT IN AERS: INHALATION_SPRAY | RESPIRATORY_TRACT | 1 refills | Status: DC

## 2022-07-15 ENCOUNTER — Ambulatory Visit: Payer: Medicare Other | Admitting: Nurse Practitioner

## 2022-07-17 ENCOUNTER — Other Ambulatory Visit: Payer: Self-pay | Admitting: Pulmonary Disease

## 2022-07-21 ENCOUNTER — Ambulatory Visit (HOSPITAL_COMMUNITY)
Admission: RE | Admit: 2022-07-21 | Discharge: 2022-07-21 | Disposition: A | Discharge status: Home / Self Care | Payer: Medicare Other | Source: Ambulatory Visit | Attending: Family Medicine | Admitting: Family Medicine

## 2022-07-21 DIAGNOSIS — Z87891 Personal history of nicotine dependence: Secondary | ICD-10-CM | POA: Diagnosis not present

## 2022-07-21 DIAGNOSIS — R0602 Shortness of breath: Secondary | ICD-10-CM | POA: Insufficient documentation

## 2022-07-21 DIAGNOSIS — I080 Rheumatic disorders of both mitral and aortic valves: Secondary | ICD-10-CM | POA: Diagnosis not present

## 2022-07-21 DIAGNOSIS — I10 Essential (primary) hypertension: Secondary | ICD-10-CM | POA: Diagnosis not present

## 2022-07-21 DIAGNOSIS — J449 Chronic obstructive pulmonary disease, unspecified: Secondary | ICD-10-CM | POA: Diagnosis not present

## 2022-07-21 DIAGNOSIS — R0600 Dyspnea, unspecified: Secondary | ICD-10-CM | POA: Diagnosis present

## 2022-07-21 LAB — ECHOCARDIOGRAM COMPLETE
Area-P 1/2: 3.65 cm2
P 1/2 time: 413 msec
S' Lateral: 4.15 cm

## 2022-07-22 ENCOUNTER — Emergency Department (HOSPITAL_COMMUNITY)
Admission: EM | Admit: 2022-07-22 | Discharge: 2022-07-22 | Disposition: A | Discharge status: Home / Self Care | Payer: Medicare Other | Attending: Emergency Medicine | Admitting: Emergency Medicine

## 2022-07-22 ENCOUNTER — Other Ambulatory Visit: Payer: Self-pay

## 2022-07-22 ENCOUNTER — Emergency Department (HOSPITAL_COMMUNITY): Payer: Medicare Other

## 2022-07-22 ENCOUNTER — Ambulatory Visit (INDEPENDENT_AMBULATORY_CARE_PROVIDER_SITE_OTHER): Payer: Medicare Other | Admitting: Family Medicine

## 2022-07-22 ENCOUNTER — Encounter: Payer: Self-pay | Admitting: Family Medicine

## 2022-07-22 DIAGNOSIS — J441 Chronic obstructive pulmonary disease with (acute) exacerbation: Secondary | ICD-10-CM | POA: Diagnosis not present

## 2022-07-22 DIAGNOSIS — J9600 Acute respiratory failure, unspecified whether with hypoxia or hypercapnia: Secondary | ICD-10-CM | POA: Diagnosis not present

## 2022-07-22 DIAGNOSIS — R0602 Shortness of breath: Secondary | ICD-10-CM | POA: Diagnosis present

## 2022-07-22 DIAGNOSIS — Z7951 Long term (current) use of inhaled steroids: Secondary | ICD-10-CM | POA: Insufficient documentation

## 2022-07-22 DIAGNOSIS — I509 Heart failure, unspecified: Secondary | ICD-10-CM | POA: Insufficient documentation

## 2022-07-22 LAB — TROPONIN I (HIGH SENSITIVITY)
Troponin I (High Sensitivity): 6 ng/L (ref <18)
Troponin I (High Sensitivity): 8 ng/L (ref <18)

## 2022-07-22 LAB — HEPATIC FUNCTION PANEL
ALT: 36 U/L (ref 0–44)
AST: 24 U/L (ref 15–41)
Albumin: 4.1 g/dL (ref 3.5–5.0)
Alkaline Phosphatase: 64 U/L (ref 38–126)
Bilirubin, Direct: <0.1 mg/dL (ref 0.0–0.2)
Total Bilirubin: 0.5 mg/dL (ref 0.3–1.2)
Total Protein: 7.4 g/dL (ref 6.5–8.1)

## 2022-07-22 LAB — CBC
HCT: 43.7 % (ref 39.0–52.0)
Hemoglobin: 14.8 g/dL (ref 13.0–17.0)
MCH: 32.6 pg (ref 26.0–34.0)
MCHC: 33.9 g/dL (ref 30.0–36.0)
MCV: 96.3 fL (ref 80.0–100.0)
Platelets: 321 10*3/uL (ref 150–400)
RBC: 4.54 MIL/uL (ref 4.22–5.81)
RDW: 12.6 % (ref 11.5–15.5)
WBC: 7.1 10*3/uL (ref 4.0–10.5)
nRBC: 0 % (ref 0.0–0.2)

## 2022-07-22 LAB — BRAIN NATRIURETIC PEPTIDE: B Natriuretic Peptide: 22.7 pg/mL (ref 0.0–100.0)

## 2022-07-22 LAB — URINALYSIS, ROUTINE W REFLEX MICROSCOPIC
Bilirubin Urine: NEGATIVE
Glucose, UA: NEGATIVE mg/dL
Hgb urine dipstick: NEGATIVE
Ketones, ur: NEGATIVE mg/dL
Leukocytes,Ua: NEGATIVE
Nitrite: NEGATIVE
Protein, ur: NEGATIVE mg/dL
Specific Gravity, Urine: 1.019 (ref 1.005–1.030)
pH: 5 (ref 5.0–8.0)

## 2022-07-22 LAB — BASIC METABOLIC PANEL
Anion gap: 8 (ref 5–15)
BUN: 15 mg/dL (ref 8–23)
CO2: 22 mmol/L (ref 22–32)
Calcium: 9.5 mg/dL (ref 8.9–10.3)
Chloride: 110 mmol/L (ref 98–111)
Creatinine, Ser: 1.26 mg/dL — ABNORMAL HIGH (ref 0.61–1.24)
GFR, Estimated: >60 mL/min (ref >60)
Glucose, Bld: 71 mg/dL (ref 70–99)
Potassium: 3.2 mmol/L — ABNORMAL LOW (ref 3.5–5.1)
Sodium: 140 mmol/L (ref 135–145)

## 2022-07-22 LAB — CBG MONITORING, ED: Glucose-Capillary: 86 mg/dL (ref 70–99)

## 2022-07-22 MED ORDER — PREDNISONE 20 MG PO TABS: ORAL_TABLET | ORAL | 0 refills | Status: DC

## 2022-07-22 MED ORDER — IPRATROPIUM-ALBUTEROL 0.5-2.5 (3) MG/3ML IN SOLN
3.0000 mL | Freq: Once | RESPIRATORY_TRACT | Status: AC
  Administered 2022-07-22: 3 mL via RESPIRATORY_TRACT
  Filled 2022-07-22: qty 3

## 2022-07-22 MED ORDER — PREDNISONE 20 MG PO TABS
60.0000 mg | ORAL_TABLET | Freq: Once | ORAL | Status: AC
  Administered 2022-07-22: 60 mg via ORAL
  Filled 2022-07-22: qty 3

## 2022-07-22 NOTE — ED Provider Notes (Signed)
[facility] EMERGENCY DEPARTMENT Provider Note   CSN: [id number] Arrival date & time: 07/22/22  8466     History  Chief Complaint  Patient presents with   Shortness of Breath   Weakness    TEMITOPE GRIFFING is a 66 y.o. male.  HPI Patient reports he has been having gradual worsening of his baseline shortness of breath over several weeks to months.  Has been chronically on oxygen for a long time.  He used to have a much better exercise tolerance.  No active chest pain.  No fevers.  No swelling of the legs.  Patient had an echocardiogram done yesterday which showed an EF of 35%.  He had global hypokinesis of the left ventricle.  The prior echo was from 2006 with an EF of 55 to 60%.  He was advised to come to the emergency department for further evaluation of congestive heart failure.    Home Medications Prior to Admission medications   Medication Sig Start Date End Date Taking? Authorizing Provider  acetaminophen (TYLENOL) 500 MG tablet Take 1,000 mg by mouth every 6 (six) hours as needed for mild pain or moderate pain.   Yes [provider]  albuterol (VENTOLIN HFA) 108 (90 Base) MCG/ACT inhaler INHALE 2 PUFFS EVERY 6 HOURS AS NEEDED FOR WHEEZING OR SHORTNESS OF BREATH Patient taking differently: Inhale 2 puffs into the lungs every 6 (six) hours as needed for wheezing or shortness of breath. 07/03/22  Yes Martyn Malay, MD  amLODipine (NORVASC) 10 MG tablet Take 1 tablet (10 mg total) by mouth daily. 10/19/21  Yes Alcus Dad, MD  cetirizine (ZYRTEC) 10 MG tablet Take 1 tablet (10 mg total) by mouth daily. 02/15/22  Yes Sowell, Erlene Quan, MD  fluticasone (FLONASE) 50 MCG/ACT nasal spray Place 2 sprays into both nostrils daily. 1 spray in each nostril every day Patient taking differently: Place 1 spray into both nostrils daily. 02/15/22  Yes Sowell, Erlene Quan, MD  Fluticasone-Umeclidin-Vilant (TRELEGY ELLIPTA) 100-62.5-25 MCG/ACT AEPB TAKE 1 PUFF BY MOUTH EVERY  DAY Patient taking differently: Inhale 1 puff into the lungs daily. 03/31/22  Yes Eppie Gibson, MD  ipratropium-albuterol (DUONEB) 0.5-2.5 (3) MG/3ML SOLN INHALE 1 VIAL VIA NEBULIZER EVERY 6 HOURS AS NEEDED FOR SHORTNESS OF BREATH /  WHEEZING Patient taking differently: Take 3 mLs by nebulization every 6 (six) hours as needed (Shortness of breath or wheezing). 03/31/22  Yes Eppie Gibson, MD  predniSONE (DELTASONE) 20 MG tablet 2 tabs po daily x 4 days 07/22/22  Yes Jamilett Ferrante, Jeannie Done, MD  rosuvastatin (CRESTOR) 20 MG tablet Take 0.5 tablets (10 mg total) by mouth daily. Patient not taking: Reported on 07/22/2022 05/20/22   [name], MD      Allergies    Crestor [rosuvastatin calcium], Zestril [lisinopril], Spiriva respimat [tiotropium bromide monohydrate], and Tiotropium    Review of Systems   Review of Systems 10 systems reviewed negative except as per HPI Physical Exam Updated Vital Signs BP (!) 136/90   Pulse 67   Temp 98.4 F (36.9 C) (Oral)   Resp 14   Ht '5\' 9"'$  (1.753 m)   Wt 70.3 kg   SpO2 96%   BMI 22.89 kg/m  Physical Exam Constitutional:      Comments: Patient is alert.  Clear mental status.  No respiratory distress at rest.  HENT:     Head: Normocephalic and atraumatic.     Mouth/Throat:     Pharynx: Oropharynx is clear.  Eyes:  Extraocular Movements: Extraocular movements intact.  Cardiovascular:     Rate and Rhythm: Normal rate and regular rhythm.     Comments: No significant murmur appreciated.  No gallop. Pulmonary:     Comments: Patient does not exhibit respiratory distress at rest.  He does have fine expiratory wheeze bilaterally from the mid to lower lung fields.  No appreciable crackle. Abdominal:     General: There is no distension.     Palpations: Abdomen is soft.     Tenderness: There is no abdominal tenderness. There is no guarding.     Comments: No abdominal distention or right upper quadrant tenderness.  Musculoskeletal:        General:  Normal range of motion.     Right lower leg: No edema.     Left lower leg: No edema.     Comments: No peripheral edema.  Calves are soft and nontender.  Condition of lower legs is very good.  Skin:    General: Skin is warm and dry.  Neurological:     General: No focal deficit present.     Mental Status: He is oriented to person, place, and time.     Motor: No weakness.     Coordination: Coordination normal.  Psychiatric:        Mood and Affect: Mood normal.     ED Results / Procedures / Treatments   Labs (all labs ordered are listed, but only abnormal results are displayed) Labs Reviewed  BASIC METABOLIC PANEL - Abnormal; Notable for the following components:      Result Value   Potassium 3.2 (*)    Creatinine, Ser 1.26 (*)    All other components within normal limits  CBC  URINALYSIS, ROUTINE W REFLEX MICROSCOPIC  HEPATIC FUNCTION PANEL  BRAIN NATRIURETIC PEPTIDE  CBG MONITORING, ED  TROPONIN I (HIGH SENSITIVITY)  TROPONIN I (HIGH SENSITIVITY)    EKG EKG Interpretation  Date/Time:  Friday July 22 2022 09:53:49 EDT Ventricular Rate:  90 PR Interval:  140 QRS Duration: 98 QT Interval:  386 QTC Calculation: 472 R Axis:   64 Text Interpretation: Sinus rhythm with occasional Premature ventricular complexes Otherwise normal ECG When compared with ECG of 29-Mar-2022 22:09, PREVIOUS ECG IS PRESENT no sig change from prevous Confirmed by Charlesetta Shanks [phone]) on 07/22/2022 5:37:34 PM  Radiology DG Chest 2 View  Result Date: 07/22/2022 CLINICAL DATA:  Shortness of breath. EXAM: CHEST - 2 VIEW COMPARISON:  04/27/2022 FINDINGS: The lungs are clear without focal pneumonia, edema, pneumothorax or pleural effusion. Hyperexpansion suggests emphysema. the cardiopericardial silhouette is within normal limits for size. The visualized bony structures of the thorax are unremarkable. Telemetry leads overlie the chest. IMPRESSION: Hyperexpansion without acute cardiopulmonary findings.  Electronically Signed   By: Misty Stanley M.D.   On: 07/22/2022 13:05   ECHOCARDIOGRAM COMPLETE  Result Date: 07/21/2022    ECHOCARDIOGRAM REPORT   Patient Name:   JAVARIS WIGINGTON Date of Exam: 07/21/2022 Medical Rec #:  [id number]       Height:       69.0 in Accession #:    [id number]      Weight:       156.3 lb Date of Birth:  09-30-1956      BSA:          1.860 m Patient Age:    30 years        BP:           136/84 mmHg Patient Gender: M  HR:           75 bpm. Exam Location:  Outpatient Procedure: 2D Echo, 3D Echo, Cardiac Doppler and Color Doppler Indications:    R06.00 Dyspnea  History:        Patient has no prior history of Echocardiogram examinations.                 COPD, Signs/Symptoms:Shortness of Breath; Risk                 Factors:Hypertension and Former Smoker.  Sonographer:    Deliah Boston RDCS Referring Phys: [place]  1. Left ventricular ejection fraction, by estimation, is 35%. The left ventricle has moderately decreased function. The left ventricle demonstrates global hypokinesis.  2. Right ventricular systolic function is mildly reduced. The right ventricular size is normal.  3. The mitral valve is normal in structure. Mild mitral valve regurgitation.  4. The aortic valve is tricuspid. Aortic valve regurgitation is trivial. Aortic valve sclerosis is present, with no evidence of aortic valve stenosis.  5. The inferior vena cava is normal in size with greater than 50% respiratory variability, suggesting right atrial pressure of 3 mmHg. FINDINGS  Left Ventricle: Left ventricular ejection fraction, by estimation, is 35%. The left ventricle has moderately decreased function. The left ventricle demonstrates global hypokinesis. The left ventricular internal cavity size was normal in size. There is no left ventricular hypertrophy. Right Ventricle: The right ventricular size is normal. Right vetricular wall thickness was not assessed. Right ventricular systolic  function is mildly reduced. Left Atrium: Left atrial size was normal in size. Right Atrium: Right atrial size was normal in size. Pericardium: There is no evidence of pericardial effusion. Mitral Valve: The mitral valve is normal in structure. Mild mitral valve regurgitation. Tricuspid Valve: The tricuspid valve is normal in structure. Tricuspid valve regurgitation is trivial. Aortic Valve: The aortic valve is tricuspid. Aortic valve regurgitation is trivial. Aortic regurgitation PHT measures 413 msec. Aortic valve sclerosis is present, with no evidence of aortic valve stenosis. Pulmonic Valve: The pulmonic valve was grossly normal. Pulmonic valve regurgitation is not visualized. Aorta: The aortic root and ascending aorta are structurally normal, with no evidence of dilitation. Venous: The inferior vena cava is normal in size with greater than 50% respiratory variability, suggesting right atrial pressure of 3 mmHg. IAS/Shunts: No atrial level shunt detected by color flow Doppler.  LEFT VENTRICLE PLAX 2D LVIDd:         5.05 cm   Diastology LVIDs:         4.15 cm   LV e' medial:    6.64 cm/s LV PW:         1.00 cm   LV E/e' medial:  7.1 LV IVS:        0.80 cm   LV e' lateral:   11.70 cm/s LVOT diam:     2.40 cm   LV E/e' lateral: 4.0 LV SV:         60 LV SV Index:   32 LVOT Area:     4.52 cm                           3D Volume EF:                          3D EF:        55 %  LV EDV:       167 ml                          LV ESV:       75 ml                          LV SV:        93 ml RIGHT VENTRICLE RV Basal diam:  3.40 cm RV S prime:     8.86 cm/s TAPSE (M-mode): 1.3 cm LEFT ATRIUM             Index        RIGHT ATRIUM           Index LA diam:        3.90 cm 2.10 cm/m   RA Area:     12.40 cm LA Vol (A2C):   55.8 ml 29.99 ml/m  RA Volume:   32.40 ml  17.41 ml/m LA Vol (A4C):   40.8 ml 21.93 ml/m LA Biplane Vol: 49.0 ml 26.34 ml/m  AORTIC VALVE LVOT Vmax:   57.25 cm/s LVOT Vmean:  41.700  cm/s LVOT VTI:    0.132 m AI PHT:      413 msec  AORTA Ao Root diam: 3.20 cm Ao Asc diam:  3.10 cm MITRAL VALVE MV Area (PHT): cm         SHUNTS MV Decel Time: 208 msec    Systemic VTI:  0.13 m MV E velocity: 46.90 cm/s  Systemic Diam: 2.40 cm MV A velocity: 81.85 cm/s MV E/A ratio:  0.57 Dorris Carnes MD Electronically signed by Dorris Carnes MD Signature Date/Time: 07/21/2022/6:56:24 PM    Final     Procedures Procedures    Medications Ordered in ED Medications  predniSONE (DELTASONE) tablet 60 mg (has no administration in time range)  ipratropium-albuterol (DUONEB) 0.5-2.5 (3) MG/3ML nebulizer solution 3 mL (has no administration in time range)    ED Course/ Medical Decision Making/ A&P                           Medical Decision Making Amount and/or Complexity of Data Reviewed Labs: ordered. Radiology: ordered.  Risk Prescription drug management.   Patient presents as outlined.  Differential diagnosis includes COPD exacerbation\CHF\CHF exacerbation\pneumonia.  Patient is not in any acute distress.  He is on his baseline 2 L of nasal cannula oxygen with saturations in the mid to high 90s.  Pressures are stable.  Patient reports he felt symptoms were most like prior COPD exacerbations.  He has not had chest pain or any swelling of the abdomen or legs.  Reports symptoms often respond pretty well to steroids.  We will proceed with BNP, chest x-ray, troponin and basic lab work for further evaluation.  X-ray reviewed by radiology shows changes consistent with COPD but no significant infiltrate or appearance of congestive heart failure.  BNP is normal.  Troponin is normal.  At this time with no elevation in BNP and no clinical signs of CHF exacerbation, I have lower suspicion for CHF exacerbation.  Patient's echocardiogram does reflect decreased EF and global ventricular changes however the prior echo was from 2006.  I suspect this has been a more indolent process in conjunction with the  patient's chronic lung disease.  Reviewed with the patient that he certainly has risk for CHF and CHF exacerbations however  at the current time I suspect his symptoms are due to COPD.  Patient will be given prednisone orally and a DuoNeb in the emergency department.  He feels comfortable going home.  I have advised he does need close follow-up and monitoring for maximization of management of congestive heart failure as well as COPD.  Patient voices understanding.  His wife has been at bedside for all evaluations and questions.        Final Clinical Impression(s) / ED Diagnoses Final diagnoses:  COPD exacerbation ([place])    Rx / DC Orders ED Discharge Orders          Ordered    predniSONE (DELTASONE) 20 MG tablet        07/22/22 1730              Charlesetta Shanks, MD 07/22/22 1741

## 2022-07-22 NOTE — ED Triage Notes (Signed)
Pt/wife stated, he was sent here from [facility] for increased no energy and SOB and weakness for the last month. He has been on his Oxygen all the time.

## 2022-07-22 NOTE — Progress Notes (Signed)
    SUBJECTIVE:   CHIEF COMPLAINT / HPI:   Shortness of Breath This is a recurrent (Chronic respiratory distress worsened in the last 2 weeks. He is normally on 2 L O2 at home, but now on 2.5 L) problem. The problem occurs intermittently. The problem has been gradually worsening. Associated symptoms include wheezing. Pertinent negatives include no chest pain, fever or leg swelling. Exacerbated by: SOB is worse on exertion and improves with oral steroid use. He last used steroid about a month ago. Associated symptoms comments: Cough intermittently with sputum production.  Hx of previous hospitalization for similar presentation. Intubated three times in the past. He has been using albuterol daily and last dose was this morning prior to this visit with minimal improvement.    PERTINENT  PMH / PSH: PMHx reviewed.  OBJECTIVE:   BP (!) 148/85   Pulse 88   Ht '5\' 9"'$  (1.753 m)   Wt 155 lb 6.4 oz (70.5 kg)   SpO2 100%   BMI 22.95 kg/m   Physical Exam Vitals reviewed.  Constitutional:      Comments: Moderate respiratory distress  Cardiovascular:     Rate and Rhythm: Normal rate.     Heart sounds: Normal heart sounds. No murmur heard. Pulmonary:     Effort: Respiratory distress present.     Breath sounds: No wheezing.     Comments: Increased work of breathing. Respiratory distress worsened when he ambulated from the chair to climb the exam table Chest:     Chest wall: No tenderness.  Abdominal:     General: There is no distension.     Palpations: Abdomen is soft.     Tenderness: There is no abdominal tenderness.  Musculoskeletal:     Right lower leg: No edema.     Left lower leg: No edema.  Neurological:     General: No focal deficit present.     Mental Status: He is oriented to person, place, and time.      ASSESSMENT/PLAN:   Acute respiratory failure (HCC) Acute respiratory failure without hypoxia. COPD vs. new diagnosis of systolic CHF with acute exacerbation. O2 on RA  during this visit was 90. His pulmonologist evaluated him in July for a similar presentation for which imaging was ordered, and he was treated with oral steroids with some improvement. CT lungs were reviewed, which showed moderate centrilobular emphysema with diffuse bronchial wall thickening, compatible with the provided history of COPD. She had an ECHO done yesterday, which showed an EF of 35% with global hypokinesia of his left ventricle. This has worsened from his EF of 55 -60% in 2006. Given his work of breathing and ECHO report, I worry that his symptoms are a combination of CHF and COPD. ED evaluation was recommended, and he agreed with the plan. I called and discussed the ED transfer with the Charge nurse - Alonja I called the patient a few minutes later, and he confirmed that his ED assessment had begun.   More than 50% of this 35 minutes face to face encounter was spent on evaluation and coordination of care.  Andrena Mews, MD [place]

## 2022-07-22 NOTE — ED Notes (Signed)
Patient verbalizes understanding of discharge instructions. Opportunity for questioning and answers were provided. Armband removed by staff, pt discharged from ED. Pt taken to ED entrance via wheel chair.  

## 2022-07-22 NOTE — Assessment & Plan Note (Signed)
Acute respiratory failure without hypoxia. COPD vs. new diagnosis of systolic CHF with acute exacerbation. O2 on RA during this visit was 90. His pulmonologist evaluated him in July for a similar presentation for which imaging was ordered, and he was treated with oral steroids with some improvement. CT lungs were reviewed, which showed moderate centrilobular emphysema with diffuse bronchial wall thickening, compatible with the provided history of COPD. She had an ECHO done yesterday, which showed an EF of 35% with global hypokinesia of his left ventricle. This has worsened from his EF of 55 -60% in 2006. Given his work of breathing and ECHO report, I worry that his symptoms are a combination of CHF and COPD. ED evaluation was recommended, and he agreed with the plan. I called and discussed the ED transfer with the Charge nurse - Alonja I called the patient a few minutes later, and he confirmed that his ED assessment had begun.

## 2022-07-22 NOTE — Discharge Instructions (Addendum)
1.  At this time you do not appear to be having a heart failure episode.  However, your echocardiogram has shown decreased function of your heart.  Continue to work with your doctor for monitoring this and treatment. 2.  You are wheezing and I currently suspect that your symptoms are a COPD exacerbation.  You have been given a dose of prednisone in the emergency department.  Start prednisone tomorrow as prescribed.  Continue using all of your inhalers as prescribed. 3.  Return to the emergency department if you have new worsening or concerning symptoms.

## 2022-07-22 NOTE — Patient Instructions (Addendum)

## 2022-07-26 ENCOUNTER — Ambulatory Visit (INDEPENDENT_AMBULATORY_CARE_PROVIDER_SITE_OTHER): Payer: Medicare Other | Admitting: Nurse Practitioner

## 2022-07-26 ENCOUNTER — Encounter: Payer: Self-pay | Admitting: Nurse Practitioner

## 2022-07-26 VITALS — BP 138/76 | HR 88 | Ht 69.0 in | Wt 156.2 lb

## 2022-07-26 DIAGNOSIS — J449 Chronic obstructive pulmonary disease, unspecified: Secondary | ICD-10-CM | POA: Diagnosis not present

## 2022-07-26 DIAGNOSIS — I509 Heart failure, unspecified: Secondary | ICD-10-CM | POA: Insufficient documentation

## 2022-07-26 DIAGNOSIS — Z87891 Personal history of nicotine dependence: Secondary | ICD-10-CM

## 2022-07-26 DIAGNOSIS — Z720 Tobacco use: Secondary | ICD-10-CM | POA: Diagnosis not present

## 2022-07-26 DIAGNOSIS — I5022 Chronic systolic (congestive) heart failure: Secondary | ICD-10-CM | POA: Diagnosis not present

## 2022-07-26 DIAGNOSIS — J441 Chronic obstructive pulmonary disease with (acute) exacerbation: Secondary | ICD-10-CM | POA: Diagnosis not present

## 2022-07-26 DIAGNOSIS — J9611 Chronic respiratory failure with hypoxia: Secondary | ICD-10-CM

## 2022-07-26 MED ORDER — ALBUTEROL SULFATE HFA 108 (90 BASE) MCG/ACT IN AERS: INHALATION_SPRAY | RESPIRATORY_TRACT | 1 refills | Status: DC

## 2022-07-26 MED ORDER — TRELEGY ELLIPTA 100-62.5-25 MCG/ACT IN AEPB: INHALATION_SPRAY | RESPIRATORY_TRACT | 5 refills | Status: DC

## 2022-07-26 MED ORDER — FLUTICASONE PROPIONATE 50 MCG/ACT NA SUSP: 2.0000 | Freq: Every day | NASAL | 12 refills | Status: DC

## 2022-07-26 MED ORDER — PREDNISONE 10 MG PO TABS: ORAL_TABLET | ORAL | 0 refills | Status: DC

## 2022-07-26 MED ORDER — IPRATROPIUM-ALBUTEROL 0.5-2.5 (3) MG/3ML IN SOLN: RESPIRATORY_TRACT | 11 refills | Status: DC

## 2022-07-26 NOTE — Assessment & Plan Note (Addendum)
Quit 2021. Stable RUL nodule. New parenchymal band 05/2022. Referred to lung cancer screening program.

## 2022-07-26 NOTE — Progress Notes (Signed)
$'@Patient'g$  ID: [name], male    DOB: 10/27/1956, 66 y.o.   MRN: [id number]  Chief Complaint  Patient presents with   Follow-up    Pt is here for follow up for COPD. Pt states he was in the ED last week and was placed on prednisone. Pt is currently on Trelegy daily and Albuterol as needed     Referring provider: [name], MD  HPI: 66 year old male, former smoker followed for COPD and nocturnal hypoxemia. He is a patient of Dr. [name] and last seen in office on 06/03/2022 by Dr. [name]. Past medical history significant for HTN, allergies, HLD, ED.   TEST/EVENTS:  2015 PFTs: FVC 79%, FEV1 57% 06/02/2022 CT chest: stable RUL apical nodule and new RML curvlinear parenchymal band. Moderate centrilobular emphysema 07/21/2022 echo: EF 35%. LV global hypokinesis. RV function mildly reduced; size normal. Mild MR. 07/22/2022 CXR: hyperexpansion with emphysema. No acute process.   06/03/2022: OV with Dr. [name] for acute visit. Increased dyspnea. Treated for AECOPD with z pack and prolonged steroid taper. Concern for asthma overlap given recurrent exacerbations and elevated eosinophils 03/2021 of 700. Recommend checking CBC with diff and IgE once off steroids. Will need CT chest follow up for parenchymal band.  07/26/2022: Today - follow up Patient presents today for 2 month follow up. He was seen in the ED recently (9/1) for increased dyspnea. Echo prior to admission showed EF of 35%. Because of this, he was advised to go to the ED for further evaluation of CHF. His CXR was without evidence of volume overload and BNP was normal. He was treated for AECOPD with prednisone burst.  He reports that he had some sort of viral illness towards the end of August. He developed a productive cough with gray sputum and chest congestion. He then started having trouble with his breathing, which he started taking prednisone 10 mg for that he had at home. He had gone for an echocardiogram on 8/31 and then his PCP  on 9/1 for follow up. They recommended he go to the ER due to his symptoms and low EF.   Today, he reports feeling significantly better. Feels like the prednisone is helping; he's on day 4 of 40 mg. Breathing feels back to baseline and he hasn't had to use his oxygen during the day. His cough has mostly resolved; occasional AM cough with clear sputum, which is normal for him. He denies any wheezing, hemoptysis, weight loss, anorexia, orthopnea, PND, leg swelling, palpitations. He continues on Trelegy daily.   Allergies  Allergen Reactions   Crestor [Rosuvastatin Calcium] Shortness Of Breath   Zestril [Lisinopril] Swelling   Spiriva Respimat [Tiotropium Bromide Monohydrate] Other (See Comments)    Red, glassy eyes   Tiotropium Other (See Comments)    Red glassy eyes     Immunization History  Administered Date(s) Administered   Fluad Quad(high Dose 65+) 08/07/2020, 09/14/2021   H1N1 12/02/2008   Influenza Whole 09/06/2007, 12/02/2008   Influenza, Seasonal, Injecte, Preservative Fre 08/01/2014   Influenza,inj,Quad PF,6+ Mos 09/04/2015, 09/01/2018, 10/09/2019   Influenza-Unspecified 08/21/2013   PFIZER(Purple Top)SARS-COV-2 Vaccination 03/05/2020, 03/30/2020, 09/30/2020   Pfizer Covid-19 Vaccine Bivalent Booster 29yr & up 10/18/2021   Pneumococcal Conjugate-13 10/23/2015   Pneumococcal Polysaccharide-23 08/21/2004, 08/28/2020   Td 01/19/2006   Zoster, Live 12/04/2015    Past Medical History:  Diagnosis Date   Asthma    COPD (chronic obstructive pulmonary disease) (HCC)    Hypertension    Shortness of breath  Tobacco History: Social History   Tobacco Use  Smoking Status Former   Packs/day: 0.50   Years: 30.00   Total pack years: 15.00   Types: Cigarettes   Quit date: 08/25/2020   Years since quitting: 1.9  Smokeless Tobacco Never   Counseling given: Not Answered   Outpatient Medications Prior to Visit  Medication Sig Dispense Refill   acetaminophen (TYLENOL) 500  MG tablet Take 1,000 mg by mouth every 6 (six) hours as needed for mild pain or moderate pain.     amLODipine (NORVASC) 10 MG tablet Take 1 tablet (10 mg total) by mouth daily. 90 tablet 3   cetirizine (ZYRTEC) 10 MG tablet Take 1 tablet (10 mg total) by mouth daily. 30 tablet 11   rosuvastatin (CRESTOR) 20 MG tablet Take 0.5 tablets (10 mg total) by mouth daily. 90 tablet 0   albuterol (VENTOLIN HFA) 108 (90 Base) MCG/ACT inhaler INHALE 2 PUFFS EVERY 6 HOURS AS NEEDED FOR WHEEZING OR SHORTNESS OF BREATH (Patient taking differently: Inhale 2 puffs into the lungs every 6 (six) hours as needed for wheezing or shortness of breath.) 18 g 1   fluticasone (FLONASE) 50 MCG/ACT nasal spray Place 2 sprays into both nostrils daily. 1 spray in each nostril every day (Patient taking differently: Place 1 spray into both nostrils daily.) 16 g 12   Fluticasone-Umeclidin-Vilant (TRELEGY ELLIPTA) 100-62.5-25 MCG/ACT AEPB TAKE 1 PUFF BY MOUTH EVERY DAY (Patient taking differently: Inhale 1 puff into the lungs daily.) 60 each 5   ipratropium-albuterol (DUONEB) 0.5-2.5 (3) MG/3ML SOLN INHALE 1 VIAL VIA NEBULIZER EVERY 6 HOURS AS NEEDED FOR SHORTNESS OF BREATH /  WHEEZING (Patient taking differently: Take 3 mLs by nebulization every 6 (six) hours as needed (Shortness of breath or wheezing).) 270 mL 11   predniSONE (DELTASONE) 20 MG tablet 2 tabs po daily x 4 days 8 tablet 0   No facility-administered medications prior to visit.     Review of Systems:   Constitutional: No weight loss or gain, night sweats, fevers, chills, fatigue, or lassitude. HEENT: No headaches, difficulty swallowing, tooth/dental problems, or sore throat. No sneezing, itching, ear ache, nasal congestion, or post nasal drip CV:  No chest pain, orthopnea, PND, swelling in lower extremities, anasarca, dizziness, palpitations, syncope Resp: +shortness of breath with exertion (improving); productive AM cough (baseline). No excess mucus or change in  color of mucus. No hemoptysis. No wheezing.  No chest wall deformity GI:  No heartburn, indigestion, abdominal pain, nausea, vomiting, diarrhea, change in bowel habits, loss of appetite, bloody stools.  MSK:  No joint pain or swelling.  No decreased range of motion.  No back pain. Neuro: No dizziness or lightheadedness.  Psych: No depression or anxiety. Mood stable.     Physical Exam:  BP 138/76 (BP Location: Left Arm, Patient Position: Sitting, Cuff Size: Normal)   Pulse 88   Ht '5\' 9"'$  (1.753 m)   Wt 156 lb 3.2 oz (70.9 kg)   SpO2 98%   BMI 23.07 kg/m   GEN: Pleasant, interactive, well-appearing; in no acute distress HEENT:  Normocephalic and atraumatic. PERRLA. Sclera white. Nasal turbinates pink, moist and patent bilaterally. No rhinorrhea present. Oropharynx pink and moist, without exudate or edema. No lesions, ulcerations, or postnasal drip.  NECK:  Supple w/ fair ROM. No JVD present. No lymphadenopathy.   CV: RRR, no m/r/g, no peripheral edema. Pulses intact, +2 bilaterally. No cyanosis, pallor or clubbing. PULMONARY:  Unlabored, regular breathing. Clear bilaterally A&P w/o wheezes/rales/rhonchi. No accessory  muscle use. No dullness to percussion. GI: BS present and normoactive. Soft, non-tender to palpation. No organomegaly or masses detected. No CVA tenderness. MSK: No erythema, warmth or tenderness.  Neuro: A/Ox3. No focal deficits noted.   Skin: Warm, no lesions or rashe Psych: Normal affect and behavior. Judgement and thought content appropriate.     Lab Results:  CBC    Component Value Date/Time   WBC 7.1 07/22/2022 1034   RBC 4.54 07/22/2022 1034   HGB 14.8 07/22/2022 1034   HCT 43.7 07/22/2022 1034   PLT 321 07/22/2022 1034   MCV 96.3 07/22/2022 1034   MCH 32.6 07/22/2022 1034   MCHC 33.9 07/22/2022 1034   RDW 12.6 07/22/2022 1034   LYMPHSABS 3.6 03/27/2021 0250   MONOABS 1.7 (H) 03/27/2021 0250   EOSABS 0.7 (H) 03/27/2021 0250   BASOSABS 0.0 03/27/2021  0250    BMET    Component Value Date/Time   NA 140 07/22/2022 1034   NA 138 04/05/2021 1129   K 3.2 (L) 07/22/2022 1034   CL 110 07/22/2022 1034   CO2 22 07/22/2022 1034   GLUCOSE 71 07/22/2022 1034   BUN 15 07/22/2022 1034   BUN 35 (H) 04/05/2021 1129   CREATININE 1.26 (H) 07/22/2022 1034   CALCIUM 9.5 07/22/2022 1034   GFRNONAA >60 07/22/2022 1034   GFRAA 64 11/10/2020 1552    BNP    Component Value Date/Time   BNP 22.7 07/22/2022 1033     Imaging:  DG Chest 2 View  Result Date: 07/22/2022 CLINICAL DATA:  Shortness of breath. EXAM: CHEST - 2 VIEW COMPARISON:  04/27/2022 FINDINGS: The lungs are clear without focal pneumonia, edema, pneumothorax or pleural effusion. Hyperexpansion suggests emphysema. the cardiopericardial silhouette is within normal limits for size. The visualized bony structures of the thorax are unremarkable. Telemetry leads overlie the chest. IMPRESSION: Hyperexpansion without acute cardiopulmonary findings. Electronically Signed   By: Misty Stanley M.D.   On: 07/22/2022 13:05   ECHOCARDIOGRAM COMPLETE  Result Date: 07/21/2022    ECHOCARDIOGRAM REPORT   Patient Name:   [name] Date of Exam: 07/21/2022 Medical Rec #:  [id number]       Height:       69.0 in Accession #:    [id number]      Weight:       156.3 lb Date of Birth:  1956/06/01      BSA:          1.860 m Patient Age:    40 years        BP:           136/84 mmHg Patient Gender: M               HR:           75 bpm. Exam Location:  Outpatient Procedure: 2D Echo, 3D Echo, Cardiac Doppler and Color Doppler Indications:    R06.00 Dyspnea  History:        Patient has no prior history of Echocardiogram examinations.                 COPD, Signs/Symptoms:Shortness of Breath; Risk                 Factors:Hypertension and Former Smoker.  Sonographer:    Deliah Boston RDCS Referring Phys: [place]  1. Left ventricular ejection fraction, by estimation, is 35%. The left ventricle has  moderately decreased function. The left ventricle demonstrates global hypokinesis.  2. Right ventricular systolic function is mildly reduced. The right ventricular size is normal.  3. The mitral valve is normal in structure. Mild mitral valve regurgitation.  4. The aortic valve is tricuspid. Aortic valve regurgitation is trivial. Aortic valve sclerosis is present, with no evidence of aortic valve stenosis.  5. The inferior vena cava is normal in size with greater than 50% respiratory variability, suggesting right atrial pressure of 3 mmHg. FINDINGS  Left Ventricle: Left ventricular ejection fraction, by estimation, is 35%. The left ventricle has moderately decreased function. The left ventricle demonstrates global hypokinesis. The left ventricular internal cavity size was normal in size. There is no left ventricular hypertrophy. Right Ventricle: The right ventricular size is normal. Right vetricular wall thickness was not assessed. Right ventricular systolic function is mildly reduced. Left Atrium: Left atrial size was normal in size. Right Atrium: Right atrial size was normal in size. Pericardium: There is no evidence of pericardial effusion. Mitral Valve: The mitral valve is normal in structure. Mild mitral valve regurgitation. Tricuspid Valve: The tricuspid valve is normal in structure. Tricuspid valve regurgitation is trivial. Aortic Valve: The aortic valve is tricuspid. Aortic valve regurgitation is trivial. Aortic regurgitation PHT measures 413 msec. Aortic valve sclerosis is present, with no evidence of aortic valve stenosis. Pulmonic Valve: The pulmonic valve was grossly normal. Pulmonic valve regurgitation is not visualized. Aorta: The aortic root and ascending aorta are structurally normal, with no evidence of dilitation. Venous: The inferior vena cava is normal in size with greater than 50% respiratory variability, suggesting right atrial pressure of 3 mmHg. IAS/Shunts: No atrial level shunt detected by  color flow Doppler.  LEFT VENTRICLE PLAX 2D LVIDd:         5.05 cm   Diastology LVIDs:         4.15 cm   LV e' medial:    6.64 cm/s LV PW:         1.00 cm   LV E/e' medial:  7.1 LV IVS:        0.80 cm   LV e' lateral:   11.70 cm/s LVOT diam:     2.40 cm   LV E/e' lateral: 4.0 LV SV:         60 LV SV Index:   32 LVOT Area:     4.52 cm                           3D Volume EF:                          3D EF:        55 %                          LV EDV:       167 ml                          LV ESV:       75 ml                          LV SV:        93 ml RIGHT VENTRICLE RV Basal diam:  3.40 cm RV S prime:     8.86 cm/s TAPSE (M-mode): 1.3 cm LEFT ATRIUM  Index        RIGHT ATRIUM           Index LA diam:        3.90 cm 2.10 cm/m   RA Area:     12.40 cm LA Vol (A2C):   55.8 ml 29.99 ml/m  RA Volume:   32.40 ml  17.41 ml/m LA Vol (A4C):   40.8 ml 21.93 ml/m LA Biplane Vol: 49.0 ml 26.34 ml/m  AORTIC VALVE LVOT Vmax:   57.25 cm/s LVOT Vmean:  41.700 cm/s LVOT VTI:    0.132 m AI PHT:      413 msec  AORTA Ao Root diam: 3.20 cm Ao Asc diam:  3.10 cm MITRAL VALVE MV Area (PHT): cm         SHUNTS MV Decel Time: 208 msec    Systemic VTI:  0.13 m MV E velocity: 46.90 cm/s  Systemic Diam: 2.40 cm MV A velocity: 81.85 cm/s MV E/A ratio:  0.57 Dorris Carnes MD Electronically signed by Dorris Carnes MD Signature Date/Time: 07/21/2022/6:56:24 PM    Final           No data to display          No results found for: "NITRICOXIDE"      Assessment & Plan:   COPD exacerbation ([place]) Resolving AECOPD. Clinically improving. We will taper out his prednisone. He has been exacerbating recently. Suspected asthma overlap given history of significantly elevated eosinophils 03/2021 (700). We discussed biologic therapy today. Plan to recheck eosinophils once off prednisone. Continue triple therapy. I do suspect that his low EF is contributing to his dyspnea, to a degree. Referred to cardiology.  Patient Instructions   Continue Albuterol inhaler 2 puffs or 3 mL neb every 6 hours as needed for shortness of breath or wheezing. Notify if symptoms persist despite rescue inhaler/neb use. Continue Trelegy 1 puff daily. Brush tongue and rinse mouth afterwards Continue flonase nasal spray 2 sprays each nostril daily for allergies/nasal congestion Continue cetirizine 1 tab daily for allergies Continue supplemental O2 2 lpm at night.   We will extend your prednisone. 3 tabs for 3 days, 2 tabs for 3 days, then 1 tab for 3 days, then stop. Take in AM with food.   Referral to cardiology placed today  Referral to lung cancer screening program   Follow up in 2 weeks with Dr. [name] or Alanson Aly. If symptoms do not improve or worsen, please contact office for sooner follow up or seek emergency care.    CHF (congestive heart failure) (HCC) Recent echo with EF 35%. Appears euvolemic on exam. Recent CXR without volume overload and BNP nl. See above plan.  Chronic respiratory failure with hypoxia (HCC) Stable. He was on room air at our visit with O2 sats 98%. Encouraged to monitor at home for goal >88-90%. Continue supplemental O2 2 lpm at night.   History of tobacco use Quit 2021. Stable RUL nodule. New parenchymal band 05/2022. Referred to lung cancer screening program.    I spent 35 minutes of dedicated to the care of this patient on the date of this encounter to include pre-visit review of records, face-to-face time with the patient discussing conditions above, post visit ordering of testing, clinical documentation with the electronic health record, making appropriate referrals as documented, and communicating necessary findings to members of the patients care team.  Clayton Bibles, NP 07/26/2022  Pt aware and understands NP's role.

## 2022-07-26 NOTE — Assessment & Plan Note (Signed)
Stable. He was on room air at our visit with O2 sats 98%. Encouraged to monitor at home for goal >88-90%. Continue supplemental O2 2 lpm at night.

## 2022-07-26 NOTE — Assessment & Plan Note (Addendum)
Resolving AECOPD. Clinically improving. We will taper out his prednisone. He has been exacerbating recently. Suspected asthma overlap given history of significantly elevated eosinophils 03/2021 (700). We discussed biologic therapy today. Plan to recheck eosinophils once off prednisone. Continue triple therapy. I do suspect that his low EF is contributing to his dyspnea, to a degree. Referred to cardiology.  Patient Instructions  Continue Albuterol inhaler 2 puffs or 3 mL neb every 6 hours as needed for shortness of breath or wheezing. Notify if symptoms persist despite rescue inhaler/neb use. Continue Trelegy 1 puff daily. Brush tongue and rinse mouth afterwards Continue flonase nasal spray 2 sprays each nostril daily for allergies/nasal congestion Continue cetirizine 1 tab daily for allergies Continue supplemental O2 2 lpm at night.   We will extend your prednisone. 3 tabs for 3 days, 2 tabs for 3 days, then 1 tab for 3 days, then stop. Take in AM with food.   Referral to cardiology placed today  Referral to lung cancer screening program   Follow up in 2 weeks with Dr. [name] or Alanson Aly. If symptoms do not improve or worsen, please contact office for sooner follow up or seek emergency care.

## 2022-07-26 NOTE — Assessment & Plan Note (Signed)
Recent echo with EF 35%. Appears euvolemic on exam. Recent CXR without volume overload and BNP nl. See above plan.

## 2022-07-26 NOTE — Patient Instructions (Addendum)
Continue Albuterol inhaler 2 puffs or 3 mL neb every 6 hours as needed for shortness of breath or wheezing. Notify if symptoms persist despite rescue inhaler/neb use. Continue Trelegy 1 puff daily. Brush tongue and rinse mouth afterwards Continue flonase nasal spray 2 sprays each nostril daily for allergies/nasal congestion Continue cetirizine 1 tab daily for allergies Continue supplemental O2 2 lpm at night.   We will extend your prednisone. 3 tabs for 3 days, 2 tabs for 3 days, then 1 tab for 3 days, then stop. Take in AM with food.   Referral to cardiology placed today  Referral to lung cancer screening program   Follow up in 2 weeks with Dr. [name] or Alanson Aly. If symptoms do not improve or worsen, please contact office for sooner follow up or seek emergency care.

## 2022-08-09 ENCOUNTER — Ambulatory Visit: Payer: Medicare Other | Admitting: Nurse Practitioner

## 2022-08-09 ENCOUNTER — Telehealth: Payer: Self-pay

## 2022-08-09 NOTE — Telephone Encounter (Signed)
ATC LVMTCB to reschedule appt with [facility]

## 2022-08-17 ENCOUNTER — Ambulatory Visit: Payer: Medicare Other | Attending: Internal Medicine | Admitting: Internal Medicine

## 2022-08-17 ENCOUNTER — Encounter: Payer: Self-pay | Admitting: Internal Medicine

## 2022-08-17 VITALS — BP 158/82 | HR 90 | Ht 69.0 in | Wt 161.2 lb

## 2022-08-17 DIAGNOSIS — I509 Heart failure, unspecified: Secondary | ICD-10-CM

## 2022-08-17 DIAGNOSIS — Z01812 Encounter for preprocedural laboratory examination: Secondary | ICD-10-CM

## 2022-08-17 MED ORDER — METOPROLOL SUCCINATE ER 25 MG PO TB24: 12.5000 mg | ORAL_TABLET | Freq: Every day | ORAL | 3 refills | Status: DC

## 2022-08-17 MED ORDER — LOSARTAN POTASSIUM 25 MG PO TABS: 25.0000 mg | ORAL_TABLET | Freq: Every day | ORAL | 3 refills | Status: DC

## 2022-08-17 MED ORDER — SPIRONOLACTONE 25 MG PO TABS: 12.5000 mg | ORAL_TABLET | Freq: Every day | ORAL | 3 refills | Status: DC

## 2022-08-17 MED ORDER — EMPAGLIFLOZIN 10 MG PO TABS: 10.0000 mg | ORAL_TABLET | Freq: Every day | ORAL | 3 refills | Status: DC

## 2022-08-17 NOTE — Patient Instructions (Addendum)
Medication Instructions:  Your physician has recommended you make the following change in your medication:  STOP: Norvasc START: Metoprolol succinate (Toprol-XL) 25 mg once daily START: Jardiance 10 mg once daily START: Spironolactone 12.5 mg once daily  START: Losartan 25 mg once daily  *If you need a refill on your cardiac medications before your next appointment, please call your pharmacy*   Lab Work: TODAY: BMET, BNP, CBC If you have labs (blood work) drawn today and your tests are completely normal, you will receive your results only by: [place] (if you have MyChart) OR A paper copy in the mail If you have any lab test that is abnormal or we need to change your treatment, we will call you to review the results.   Testing/Procedures:   You will be scheduled for Cardiac MRI. Please arrive for your appointment 30-45 minutes prior to test start time.   [facility] [place], [place] [address] [phone] Please take advantage of the free valet parking available at the MAIN entrance (A entrance).  Proceed to the [facility] Radiology Department (First Floor) for check-in.    Magnetic resonance imaging (MRI) is a painless test that produces images of the inside of the body without using Xrays.  During an MRI, strong magnets and radio waves work together in a Research officer, political party to form detailed images.   MRI images may provide more details about a medical condition than X-rays, CT scans, and ultrasounds can provide.  You may be given earphones to listen for instructions.  You may eat a light breakfast and take medications as ordered with the exception of HCTZ (fluid pill, other). Please avoid stimulants for 12 hr prior to test. (Ie. Caffeine, nicotine, chocolate, or antihistamine medications)  If a contrast material will be used, an IV will be inserted into one of your veins. Contrast material will be injected into your IV. It will leave your  body through your urine within a day. You may be told to drink plenty of fluids to help flush the contrast material out of your system.  You will be asked to remove all metal, including: Watch, jewelry, and other metal objects including hearing aids, hair pieces and dentures. Also wearable glucose monitoring systems (ie. Freestyle Libre and Omnipods) (Braces and fillings normally are not a problem.)   TEST WILL TAKE APPROXIMATELY 1 HOUR  PLEASE NOTIFY SCHEDULING AT LEAST 24 HOURS IN ADVANCE IF YOU ARE UNABLE TO KEEP YOUR APPOINTMENT. [phone]  Please call Marchia Bond, cardiac imaging nurse navigator with any questions/concerns. Marchia Bond RN Navigator Cardiac Imaging Gordy Clement RN Navigator Cardiac Imaging [facility] [phone] Office      Follow-Up: At [facility], you and your health needs are our priority.  As part of our continuing mission to provide you with exceptional heart care, we have created designated Provider Care Teams.  These Care Teams include your primary Cardiologist (physician) and Advanced Practice Providers (APPs -  Physician Assistants and Nurse Practitioners) who all work together to provide you with the care you need, when you need it.  We recommend signing up for the patient portal called "MyChart".  Sign up information is provided on this After Visit Summary.  MyChart is used to connect with patients for Virtual Visits (Telemedicine).  Patients are able to view lab/test results, encounter notes, upcoming appointments, etc.  Non-urgent messages can be sent to your provider as well.   To learn more about what  you can do with MyChart, go to [email or web address].    Your next appointment:   3 month(s)  The format for your next appointment:   In Person  Provider:   None     Other Instructions   Important Information About Sugar

## 2022-08-17 NOTE — Progress Notes (Signed)
Cardiology Office Note:    Date:  08/17/2022   ID:  [name], DOB 1956/08/21, MRN [id number]  PCP:  [name], MD   [facility] Cardiologist:  Janina Mayo, MD     Referring MD: Clayton Bibles, NP   No chief complaint on file. SOB  History of Present Illness:    [name] is a 66 y.o. male with a hx of COPD, asthma, referral to cardiology for systolic HF EF 77-93%.  No prior cardiac dx. No prior cath. He's been SOB. He normal walks every day. He has low energy  and has persistent SOB. Currently on prednisone for COPD exacerbation. On supplemental O2. No PND/orthopnea/LE edema. No chest pressure.  No DM2. Still smoking. Father had PCI/ICD.   Cardiology studies: Prior EKG 07/22/2022- NSR, infrequent PVCs TTE 07/21/2022-EF 30-35%, global hypokinesis, trivial MR  Past Medical History:  Diagnosis Date   Asthma    COPD (chronic obstructive pulmonary disease) (HCC)    Hypertension    Shortness of breath     Past Surgical History:  Procedure Laterality Date   COLONOSCOPY      Current Medications: Current Meds  Medication Sig   acetaminophen (TYLENOL) 500 MG tablet Take 1,000 mg by mouth every 6 (six) hours as needed for mild pain or moderate pain.   albuterol (VENTOLIN HFA) 108 (90 Base) MCG/ACT inhaler INHALE 2 PUFFS EVERY 6 HOURS AS NEEDED FOR WHEEZING OR SHORTNESS OF BREATH   cetirizine (ZYRTEC) 10 MG tablet Take 1 tablet (10 mg total) by mouth daily.   empagliflozin (JARDIANCE) 10 MG TABS tablet Take 1 tablet (10 mg total) by mouth daily before breakfast.   fluticasone (FLONASE) 50 MCG/ACT nasal spray Place 2 sprays into both nostrils daily. 1 spray in each nostril every day   Fluticasone-Umeclidin-Vilant (TRELEGY ELLIPTA) 100-62.5-25 MCG/ACT AEPB TAKE 1 PUFF BY MOUTH EVERY DAY   ipratropium-albuterol (DUONEB) 0.5-2.5 (3) MG/3ML SOLN INHALE 1 VIAL VIA NEBULIZER EVERY 6 HOURS AS NEEDED FOR SHORTNESS OF BREATH /  WHEEZING   losartan  (COZAAR) 25 MG tablet Take 1 tablet (25 mg total) by mouth daily.   metoprolol succinate (TOPROL XL) 25 MG 24 hr tablet Take 0.5 tablets (12.5 mg total) by mouth daily.   spironolactone (ALDACTONE) 25 MG tablet Take 0.5 tablets (12.5 mg total) by mouth daily.   [DISCONTINUED] amLODipine (NORVASC) 10 MG tablet Take 1 tablet (10 mg total) by mouth daily.     Allergies:   Crestor [rosuvastatin calcium], Zestril [lisinopril], Spiriva respimat [tiotropium bromide monohydrate], and Tiotropium   Social History   Socioeconomic History   Marital status: Married    Spouse name: Not on file   Number of children: Not on file   Years of education: Not on file   Highest education level: Not on file  Occupational History   Not on file  Tobacco Use   Smoking status: Former    Packs/day: 0.50    Years: 30.00    Total pack years: 15.00    Types: Cigarettes    Quit date: 08/25/2020    Years since quitting: 1.9   Smokeless tobacco: Never  Vaping Use   Vaping Use: Never used  Substance and Sexual Activity   Alcohol use: Yes    Comment: rare/occasional   Drug use: Not Currently   Sexual activity: Yes    Partners: Female  Other Topics Concern   Not on file  Social History Narrative   Not on file  Social Determinants of Health   Financial Resource Strain: Not on file  Food Insecurity: Not on file  Transportation Needs: Not on file  Physical Activity: Not on file  Stress: Not on file  Social Connections: Not on file     Family History: The patient's family history includes Cancer in his father.  ROS:   Please see the history of present illness.     All other systems reviewed and are negative.  EKGs/Labs/Other Studies Reviewed:    The following studies were reviewed today:     Recent Labs: 07/22/2022: ALT 36; B Natriuretic Peptide 22.7; BUN 15; Creatinine, Ser 1.26; Hemoglobin 14.8; Platelets 321; Potassium 3.2; Sodium 140   Recent Lipid Panel    Component Value Date/Time    CHOL 221 (H) 03/31/2022 1513   TRIG 110 03/31/2022 1513   HDL 59 03/31/2022 1513   CHOLHDL 3.7 03/31/2022 1513   CHOLHDL 3.5 Ratio 04/17/2009 2248   VLDL 14 04/17/2009 2248   LDLCALC 142 (H) 03/31/2022 1513     Risk Assessment/Calculations:     Physical Exam:    VS:   Vitals:   08/17/22 0830  BP: (!) 158/82  Pulse: 90  SpO2: 97%  Supplemental O2 2L   Wt Readings from Last 3 Encounters:  08/17/22 161 lb 3.2 oz (73.1 kg)  07/26/22 156 lb 3.2 oz (70.9 kg)  07/22/22 155 lb (70.3 kg)     GEN:  Well nourished, well developed in no acute distress. On supplemental O2 HEENT: Normal NECK: No JVD; LYMPHATICS: No lymphadenopathy CARDIAC: RRR, no murmurs, rubs, gallops RESPIRATORY:  nl wob, mild decreased BS, no significant wheezing ABDOMEN: Soft, non-tender, non-distended MUSCULOSKELETAL:  No edema; No deformity  SKIN: Warm and dry NEUROLOGIC:  Alert and oriented x 3 PSYCHIATRIC:  Normal affect   ASSESSMENT:    Systolic HF NYHA Class I Stage B: unknown etiology. Will start GDMT. Plan to transition ARB to entresto if NYHA class worsens. CMR MRI for LGE.  He's euvolemic.  HLD: cont crestor 10 mg daily  HTN: stop norvasc to allow for BP room for GDMT.  Smoking cessation: he was given patches, has not continued.  PLAN:    In order of problems listed above:  CMR MRI Start metoprolol 25 mg XL Start jardiance 10 mg daily Start spironolactone 12.5 mg daily Start losartan 25 mg daily Stop norvasc BMET 2 weeks, BNP Follow up 3 months           Medication Adjustments/Labs and Tests Ordered: Current medicines are reviewed at length with the patient today.  Concerns regarding medicines are outlined above.  Orders Placed This Encounter  Procedures   MR CARDIAC MORPHOLOGY W WO CONTRAST   Basic Metabolic Panel (BMET)   Pro b natriuretic peptide (BNP)   CBC with Differential/Platelet   Meds ordered this encounter  Medications   metoprolol succinate (TOPROL XL) 25 MG  24 hr tablet    Sig: Take 0.5 tablets (12.5 mg total) by mouth daily.    Dispense:  90 tablet    Refill:  3   empagliflozin (JARDIANCE) 10 MG TABS tablet    Sig: Take 1 tablet (10 mg total) by mouth daily before breakfast.    Dispense:  90 tablet    Refill:  3   spironolactone (ALDACTONE) 25 MG tablet    Sig: Take 0.5 tablets (12.5 mg total) by mouth daily.    Dispense:  45 tablet    Refill:  3   losartan (COZAAR) 25  MG tablet    Sig: Take 1 tablet (25 mg total) by mouth daily.    Dispense:  90 tablet    Refill:  3    Patient Instructions  Medication Instructions:  Your physician has recommended you make the following change in your medication:  STOP: Norvasc START: Metoprolol succinate (Toprol-XL) 25 mg once daily START: Jardiance 10 mg once daily START: Spironolactone 12.5 mg once daily  START: Losartan 25 mg once daily  *If you need a refill on your cardiac medications before your next appointment, please call your pharmacy*   Lab Work: TODAY: BMET, BNP, CBC If you have labs (blood work) drawn today and your tests are completely normal, you will receive your results only by: [facility] (if you have MyChart) OR A paper copy in the mail If you have any lab test that is abnormal or we need to change your treatment, we will call you to review the results.   Testing/Procedures:   You will be scheduled for Cardiac MRI. Please arrive for your appointment 30-45 minutes prior to test start time.   [facility] [place],  [id number] [phone] Please take advantage of the free valet parking available at the MAIN entrance (A entrance).  Proceed to the [facility] Radiology Department (First Floor) for check-in.    Magnetic resonance imaging (MRI) is a painless test that produces images of the inside of the body without using Xrays.  During an MRI, strong magnets and radio waves work together in a Research officer, political party to form detailed  images.   MRI images may provide more details about a medical condition than X-rays, CT scans, and ultrasounds can provide.  You may be given earphones to listen for instructions.  You may eat a light breakfast and take medications as ordered with the exception of HCTZ (fluid pill, other). Please avoid stimulants for 12 hr prior to test. (Ie. Caffeine, nicotine, chocolate, or antihistamine medications)  If a contrast material will be used, an IV will be inserted into one of your veins. Contrast material will be injected into your IV. It will leave your body through your urine within a day. You may be told to drink plenty of fluids to help flush the contrast material out of your system.  You will be asked to remove all metal, including: Watch, jewelry, and other metal objects including hearing aids, hair pieces and dentures. Also wearable glucose monitoring systems (ie. Freestyle Libre and Omnipods) (Braces and fillings normally are not a problem.)   TEST WILL TAKE APPROXIMATELY 1 HOUR  PLEASE NOTIFY SCHEDULING AT LEAST 24 HOURS IN ADVANCE IF YOU ARE UNABLE TO KEEP YOUR APPOINTMENT. [phone]  Please call Marchia Bond, cardiac imaging nurse navigator with any questions/concerns. Marchia Bond RN Navigator Cardiac Imaging Gordy Clement RN Navigator Cardiac Imaging [facility] [phone] Office      Follow-Up: At [facility], you and your health needs are our priority.  As part of our continuing mission to provide you with exceptional heart care, we have created designated Provider Care Teams.  These Care Teams include your primary Cardiologist (physician) and Advanced Practice Providers (APPs -  Physician Assistants and Nurse Practitioners) who all work together to provide you with the care you need, when you need it.  We recommend signing up for the patient portal called "MyChart".  Sign up information is provided on this After Visit Summary.   MyChart is used to connect with patients  for Virtual Visits (Telemedicine).  Patients are able to view lab/test results, encounter notes, upcoming appointments, etc.  Non-urgent messages can be sent to your provider as well.   To learn more about what you can do with MyChart, go to [email or web address].    Your next appointment:   3 month(s)  The format for your next appointment:   In Person  Provider:   None     Other Instructions   Important Information About Sugar         Signed, Janina Mayo, MD  08/17/2022 9:25 AM    [place]

## 2022-08-18 ENCOUNTER — Encounter: Payer: Self-pay | Admitting: Nurse Practitioner

## 2022-08-18 ENCOUNTER — Ambulatory Visit (INDEPENDENT_AMBULATORY_CARE_PROVIDER_SITE_OTHER): Payer: Medicare Other | Admitting: Nurse Practitioner

## 2022-08-18 VITALS — BP 140/90 | HR 75 | Ht 69.0 in | Wt 160.0 lb

## 2022-08-18 DIAGNOSIS — I10 Essential (primary) hypertension: Secondary | ICD-10-CM | POA: Diagnosis not present

## 2022-08-18 DIAGNOSIS — J9611 Chronic respiratory failure with hypoxia: Secondary | ICD-10-CM | POA: Diagnosis not present

## 2022-08-18 DIAGNOSIS — J441 Chronic obstructive pulmonary disease with (acute) exacerbation: Secondary | ICD-10-CM | POA: Diagnosis not present

## 2022-08-18 DIAGNOSIS — I5022 Chronic systolic (congestive) heart failure: Secondary | ICD-10-CM | POA: Diagnosis not present

## 2022-08-18 DIAGNOSIS — J45901 Unspecified asthma with (acute) exacerbation: Secondary | ICD-10-CM

## 2022-08-18 LAB — CBC WITH DIFFERENTIAL/PLATELET
Basophils Absolute: 0 10*3/uL (ref 0.0–0.2)
Basos: 1 %
EOS (ABSOLUTE): 1 10*3/uL — ABNORMAL HIGH (ref 0.0–0.4)
Eos: 15 %
Hematocrit: 42 % (ref 37.5–51.0)
Hemoglobin: 14.1 g/dL (ref 13.0–17.7)
Immature Grans (Abs): 0 10*3/uL (ref 0.0–0.1)
Immature Granulocytes: 0 %
Lymphocytes Absolute: 2.8 10*3/uL (ref 0.7–3.1)
Lymphs: 42 %
MCH: 32.9 pg (ref 26.6–33.0)
MCHC: 33.6 g/dL (ref 31.5–35.7)
MCV: 98 fL — ABNORMAL HIGH (ref 79–97)
Monocytes Absolute: 0.6 10*3/uL (ref 0.1–0.9)
Monocytes: 10 %
Neutrophils Absolute: 2 10*3/uL (ref 1.4–7.0)
Neutrophils: 32 %
Platelets: 285 10*3/uL (ref 150–450)
RBC: 4.29 x10E6/uL (ref 4.14–5.80)
RDW: 12.3 % (ref 11.6–15.4)
WBC: 6.4 10*3/uL (ref 3.4–10.8)

## 2022-08-18 LAB — BASIC METABOLIC PANEL
BUN/Creatinine Ratio: 9 — ABNORMAL LOW (ref 10–24)
BUN: 13 mg/dL (ref 8–27)
CO2: 22 mmol/L (ref 20–29)
Calcium: 9.6 mg/dL (ref 8.6–10.2)
Chloride: 105 mmol/L (ref 96–106)
Creatinine, Ser: 1.4 mg/dL — ABNORMAL HIGH (ref 0.76–1.27)
Glucose: 66 mg/dL — ABNORMAL LOW (ref 70–99)
Potassium: 4.4 mmol/L (ref 3.5–5.2)
Sodium: 142 mmol/L (ref 134–144)
eGFR: 55 mL/min/{1.73_m2} — ABNORMAL LOW (ref >59)

## 2022-08-18 LAB — PRO B NATRIURETIC PEPTIDE: NT-Pro BNP: 37 pg/mL (ref 0–376)

## 2022-08-18 MED ORDER — PREDNISONE 10 MG PO TABS: ORAL_TABLET | ORAL | 0 refills | Status: DC

## 2022-08-18 MED ORDER — TRELEGY ELLIPTA 200-62.5-25 MCG/ACT IN AEPB: 1.0000 | INHALATION_SPRAY | Freq: Every day | RESPIRATORY_TRACT | 0 refills | Status: DC

## 2022-08-18 MED ORDER — PREDNISONE 10 MG PO TABS: 10.0000 mg | ORAL_TABLET | Freq: Every day | ORAL | 0 refills | Status: DC

## 2022-08-18 NOTE — Progress Notes (Signed)
$'@Patient'h$  ID: [name], male    DOB: 05-16-1956, 66 y.o.   MRN: [id number]  Chief Complaint  Patient presents with   Follow-up    Referring provider: [name], MD  HPI: 66 year old male, former smoker followed for COPD and nocturnal hypoxemia. He is a patient of Dr. [name] and last seen in office on 06/03/2022 by Dr. [name]. Past medical history significant for HTN, allergies, HLD, ED.   TEST/EVENTS:  2015 Spirometry: FVC 79%, FEV1 57% 06/02/2022 CT chest: stable RUL apical nodule and new RML curvlinear parenchymal band. Moderate centrilobular emphysema 07/21/2022 echo: EF 35%. LV global hypokinesis. RV function mildly reduced; size normal. Mild MR. 07/22/2022 CXR: hyperexpansion with emphysema. No acute process.   06/03/2022: OV with Dr. [name] for acute visit. Increased dyspnea. Treated for AECOPD with z pack and prolonged steroid taper. Concern for asthma overlap given recurrent exacerbations and elevated eosinophils 03/2021 of 700. Recommend checking CBC with diff and IgE once off steroids. Will need CT chest follow up for parenchymal band.  07/26/2022: Today - follow up Patient presents today for 2 month follow up. He was seen in the ED recently (9/1) for increased dyspnea. Echo prior to admission showed EF of 35%. Because of this, he was advised to go to the ED for further evaluation of CHF. His CXR was without evidence of volume overload and BNP was normal. He was treated for AECOPD with prednisone burst.  He reports that he had some sort of viral illness towards the end of August. He developed a productive cough with gray sputum and chest congestion. He then started having trouble with his breathing, which he started taking prednisone 10 mg for that he had at home. He had gone for an echocardiogram on 8/31 and then his PCP on 9/1 for follow up. They recommended he go to the ER due to his symptoms and low EF.   Today, he reports feeling significantly better. Feels like the  prednisone is helping; he's on day 4 of 40 mg. Breathing feels back to baseline and he hasn't had to use his oxygen during the day. His cough has mostly resolved; occasional AM cough with clear sputum, which is normal for him. He denies any wheezing, hemoptysis, weight loss, anorexia, orthopnea, PND, leg swelling, palpitations. He continues on Trelegy daily.   08/18/2022: Today - follow up Patient presents today for follow up after being treated for COPD exacerbation and referred to cardiology for evaluated of systolic HF. He was seen by Dr. [name] yesterday. He was started on Jardiance, losartan, metoprolol and spironolactone. His amlodipine was discontinued. He is more short of breath today. He has been wearing his oxygen more; not checking his O2 but feels it helps with his breathing. He felt better when he was on the prednisone and then noticed a decline in his breathing after completing it. Chest feels a little tight and he has an occasional wheeze. He denies any significant cough, orthopnea, leg swelling, hemoptysis, fevers. He is currently on Trelegy 100. Uses albuterol daily.   Allergies  Allergen Reactions   Crestor [Rosuvastatin Calcium] Shortness Of Breath   Zestril [Lisinopril] Swelling   Spiriva Respimat [Tiotropium Bromide Monohydrate] Other (See Comments)    Red, glassy eyes   Tiotropium Other (See Comments)    Red glassy eyes     Immunization History  Administered Date(s) Administered   Fluad Quad(high Dose 65+) 08/07/2020, 09/14/2021   H1N1 12/02/2008   Influenza Whole 09/06/2007, 12/02/2008   Influenza,  Seasonal, Injecte, Preservative Fre 08/01/2014   Influenza,inj,Quad PF,6+ Mos 09/04/2015, 09/01/2018, 10/09/2019   Influenza-Unspecified 08/21/2013   PFIZER(Purple Top)SARS-COV-2 Vaccination 03/05/2020, 03/30/2020, 09/30/2020   Pfizer Covid-19 Vaccine Bivalent Booster 44yr & up 10/18/2021   Pneumococcal Conjugate-13 10/23/2015   Pneumococcal Polysaccharide-23 08/21/2004,  08/28/2020   Td 01/19/2006   Zoster, Live 12/04/2015    Past Medical History:  Diagnosis Date   Asthma    COPD (chronic obstructive pulmonary disease) ([place]    Hypertension    Shortness of breath     Tobacco History: Social History   Tobacco Use  Smoking Status Former   Packs/day: 0.50   Years: 30.00   Total pack years: 15.00   Types: Cigarettes   Quit date: 08/25/2020   Years since quitting: 1.9  Smokeless Tobacco Never   Counseling given: Not Answered   Outpatient Medications Prior to Visit  Medication Sig Dispense Refill   albuterol (VENTOLIN HFA) 108 (90 Base) MCG/ACT inhaler INHALE 2 PUFFS EVERY 6 HOURS AS NEEDED FOR WHEEZING OR SHORTNESS OF BREATH 18 g 1   cetirizine (ZYRTEC) 10 MG tablet Take 1 tablet (10 mg total) by mouth daily. 30 tablet 11   fluticasone (FLONASE) 50 MCG/ACT nasal spray Place 2 sprays into both nostrils daily. 1 spray in each nostril every day 16 g 12   Fluticasone-Umeclidin-Vilant (TRELEGY ELLIPTA) 100-62.5-25 MCG/ACT AEPB TAKE 1 PUFF BY MOUTH EVERY DAY 60 each 5   ipratropium-albuterol (DUONEB) 0.5-2.5 (3) MG/3ML SOLN INHALE 1 VIAL VIA NEBULIZER EVERY 6 HOURS AS NEEDED FOR SHORTNESS OF BREATH /  WHEEZING 270 mL 11   acetaminophen (TYLENOL) 500 MG tablet Take 1,000 mg by mouth every 6 (six) hours as needed for mild pain or moderate pain.     empagliflozin (JARDIANCE) 10 MG TABS tablet Take 1 tablet (10 mg total) by mouth daily before breakfast. 90 tablet 3   losartan (COZAAR) 25 MG tablet Take 1 tablet (25 mg total) by mouth daily. 90 tablet 3   metoprolol succinate (TOPROL XL) 25 MG 24 hr tablet Take 0.5 tablets (12.5 mg total) by mouth daily. 90 tablet 3   spironolactone (ALDACTONE) 25 MG tablet Take 0.5 tablets (12.5 mg total) by mouth daily. 45 tablet 3   No facility-administered medications prior to visit.     Review of Systems:   Constitutional: No weight loss or gain, night sweats, fevers, chills, fatigue, or lassitude. HEENT: No  headaches, difficulty swallowing, tooth/dental problems, or sore throat. No sneezing, itching, ear ache, nasal congestion, or post nasal drip CV:  No chest pain, orthopnea, PND, swelling in lower extremities, anasarca, dizziness, palpitations, syncope Resp: +shortness of breath with exertion; chest tightness; occasional wheeze; occasional AM cough (baseline). No excess mucus or change in color of mucus. No hemoptysis.  No chest wall deformity GI:  No heartburn, indigestion, abdominal pain, nausea, vomiting, diarrhea, change in bowel habits, loss of appetite, bloody stools.  MSK:  No joint pain or swelling.  No decreased range of motion.  No back pain. Neuro: No dizziness or lightheadedness.  Psych: No depression or anxiety. Mood stable.     Physical Exam:  BP (!) 140/90 (BP Location: Right Arm)   Pulse 75   Ht '5\' 9"'$  (1.753 m)   Wt 160 lb (72.6 kg)   SpO2 100%   BMI 23.63 kg/m   GEN: Pleasant, interactive, well-appearing; in no acute distress HEENT:  Normocephalic and atraumatic. PERRLA. Sclera white. Nasal turbinates pink, moist and patent bilaterally. No rhinorrhea present. Oropharynx pink and  moist, without exudate or edema. No lesions, ulcerations, or postnasal drip.  NECK:  Supple w/ fair ROM. No JVD present. No lymphadenopathy.   CV: RRR, no m/r/g, no peripheral edema. Pulses intact, +2 bilaterally. No cyanosis, pallor or clubbing. PULMONARY:  Unlabored, regular breathing. Diminished bilaterally A&P w/o wheezes/rales/rhonchi. No accessory muscle use. No dullness to percussion. GI: BS present and normoactive. Soft, non-tender to palpation. No organomegaly or masses detected.  MSK: No erythema, warmth or tenderness.  Neuro: A/Ox3. No focal deficits noted.   Skin: Warm, no lesions or rashe Psych: Normal affect and behavior. Judgement and thought content appropriate.     Lab Results:  CBC    Component Value Date/Time   WBC 6.4 08/17/2022 0924   WBC 7.1 07/22/2022 1034   RBC  4.29 08/17/2022 0924   RBC 4.54 07/22/2022 1034   HGB 14.1 08/17/2022 0924   HCT 42.0 08/17/2022 0924   PLT 285 08/17/2022 0924   MCV 98 (H) 08/17/2022 0924   MCH 32.9 08/17/2022 0924   MCH 32.6 07/22/2022 1034   MCHC 33.6 08/17/2022 0924   MCHC 33.9 07/22/2022 1034   RDW 12.3 08/17/2022 0924   LYMPHSABS 2.8 08/17/2022 0924   MONOABS 1.7 (H) 03/27/2021 0250   EOSABS 1.0 (H) 08/17/2022 0924   BASOSABS 0.0 08/17/2022 0924    BMET    Component Value Date/Time   NA 142 08/17/2022 0924   K 4.4 08/17/2022 0924   CL 105 08/17/2022 0924   CO2 22 08/17/2022 0924   GLUCOSE 66 (L) 08/17/2022 0924   GLUCOSE 71 07/22/2022 1034   BUN 13 08/17/2022 0924   CREATININE 1.40 (H) 08/17/2022 0924   CALCIUM 9.6 08/17/2022 0924   GFRNONAA >60 07/22/2022 1034   GFRAA 64 11/10/2020 1552    BNP    Component Value Date/Time   BNP 22.7 07/22/2022 1033     Imaging:  DG Chest 2 View  Result Date: 07/22/2022 CLINICAL DATA:  Shortness of breath. EXAM: CHEST - 2 VIEW COMPARISON:  04/27/2022 FINDINGS: The lungs are clear without focal pneumonia, edema, pneumothorax or pleural effusion. Hyperexpansion suggests emphysema. the cardiopericardial silhouette is within normal limits for size. The visualized bony structures of the thorax are unremarkable. Telemetry leads overlie the chest. IMPRESSION: Hyperexpansion without acute cardiopulmonary findings. Electronically Signed   By: Misty Stanley M.D.   On: 07/22/2022 13:05   ECHOCARDIOGRAM COMPLETE  Result Date: 07/21/2022    ECHOCARDIOGRAM REPORT   Patient Name:   [name] Date of Exam: 07/21/2022 Medical Rec #:  [id number]       Height:       69.0 in Accession #:    [id number]      Weight:       156.3 lb Date of Birth:  [birth year]      BSA:          1.860 m Patient Age:    33 years        BP:           136/84 mmHg Patient Gender: M               HR:           75 bpm. Exam Location:  Outpatient Procedure: 2D Echo, 3D Echo, Cardiac Doppler and Color  Doppler Indications:    R06.00 Dyspnea  History:        Patient has no prior history of Echocardiogram examinations.  COPD, Signs/Symptoms:Shortness of Breath; Risk                 Factors:Hypertension and Former Smoker.  Sonographer:    Deliah Boston RDCS Referring Phys: Rand  1. Left ventricular ejection fraction, by estimation, is 35%. The left ventricle has moderately decreased function. The left ventricle demonstrates global hypokinesis.  2. Right ventricular systolic function is mildly reduced. The right ventricular size is normal.  3. The mitral valve is normal in structure. Mild mitral valve regurgitation.  4. The aortic valve is tricuspid. Aortic valve regurgitation is trivial. Aortic valve sclerosis is present, with no evidence of aortic valve stenosis.  5. The inferior vena cava is normal in size with greater than 50% respiratory variability, suggesting right atrial pressure of 3 mmHg. FINDINGS  Left Ventricle: Left ventricular ejection fraction, by estimation, is 35%. The left ventricle has moderately decreased function. The left ventricle demonstrates global hypokinesis. The left ventricular internal cavity size was normal in size. There is no left ventricular hypertrophy. Right Ventricle: The right ventricular size is normal. Right vetricular wall thickness was not assessed. Right ventricular systolic function is mildly reduced. Left Atrium: Left atrial size was normal in size. Right Atrium: Right atrial size was normal in size. Pericardium: There is no evidence of pericardial effusion. Mitral Valve: The mitral valve is normal in structure. Mild mitral valve regurgitation. Tricuspid Valve: The tricuspid valve is normal in structure. Tricuspid valve regurgitation is trivial. Aortic Valve: The aortic valve is tricuspid. Aortic valve regurgitation is trivial. Aortic regurgitation PHT measures 413 msec. Aortic valve sclerosis is present, with no evidence of  aortic valve stenosis. Pulmonic Valve: The pulmonic valve was grossly normal. Pulmonic valve regurgitation is not visualized. Aorta: The aortic root and ascending aorta are structurally normal, with no evidence of dilitation. Venous: The inferior vena cava is normal in size with greater than 50% respiratory variability, suggesting right atrial pressure of 3 mmHg. IAS/Shunts: No atrial level shunt detected by color flow Doppler.  LEFT VENTRICLE PLAX 2D LVIDd:         5.05 cm   Diastology LVIDs:         4.15 cm   LV e' medial:    6.64 cm/s LV PW:         1.00 cm   LV E/e' medial:  7.1 LV IVS:        0.80 cm   LV e' lateral:   11.70 cm/s LVOT diam:     2.40 cm   LV E/e' lateral: 4.0 LV SV:         60 LV SV Index:   32 LVOT Area:     4.52 cm                           3D Volume EF:                          3D EF:        55 %                          LV EDV:       167 ml                          LV ESV:       75 ml  LV SV:        93 ml RIGHT VENTRICLE RV Basal diam:  3.40 cm RV S prime:     8.86 cm/s TAPSE (M-mode): 1.3 cm LEFT ATRIUM             Index        RIGHT ATRIUM           Index LA diam:        3.90 cm 2.10 cm/m   RA Area:     12.40 cm LA Vol (A2C):   55.8 ml 29.99 ml/m  RA Volume:   32.40 ml  17.41 ml/m LA Vol (A4C):   40.8 ml 21.93 ml/m LA Biplane Vol: 49.0 ml 26.34 ml/m  AORTIC VALVE LVOT Vmax:   57.25 cm/s LVOT Vmean:  41.700 cm/s LVOT VTI:    0.132 m AI PHT:      413 msec  AORTA Ao Root diam: 3.20 cm Ao Asc diam:  3.10 cm MITRAL VALVE MV Area (PHT): cm         SHUNTS MV Decel Time: 208 msec    Systemic VTI:  0.13 m MV E velocity: 46.90 cm/s  Systemic Diam: 2.40 cm MV A velocity: 81.85 cm/s MV E/A ratio:  0.57 Dorris Carnes MD Electronically signed by Dorris Carnes MD Signature Date/Time: 07/21/2022/6:56:24 PM    Final           No data to display          No results found for: "NITRICOXIDE"      Assessment & Plan:   COPD exacerbation ([place]) AECOPD. He has been  exacerbating recently. He is very steroid responsive; flares once he comes off prednisone. Concern for possible asthma overlap given history of significantly elevated eosinophils; most recently 1000 08/17/2022. Step up Trelegy to 200. We will treat him with prednisone taper and transition him to 10 mg daily for the next 4-6 weeks to see how he responds. Discussed case with Dr. [name]; if he flares despite daily prednisone, may consider biologic therapy.   Patient Instructions  Step up Trelegy to 200 mcg dose, 1 puff daily. Brush tongue and rinse mouth afterwards  Continue Albuterol inhaler 2 puffs or 3 mL neb every 6 hours as needed for shortness of breath or wheezing. Notify if symptoms persist despite rescue inhaler/neb use. Continue flonase nasal spray 2 sprays each nostril daily for allergies/nasal congestion Continue cetirizine 1 tab daily for allergies Continue supplemental O2 2 lpm at night and as needed during the day to maintain oxygen >88-90%  Prednisone taper. 4 tabs for 2 days, then 3 tabs for 2 days, 2 tabs for 2 days, then start 10 mg daily until I see you back. Take in AM with food   Follow up with cardiology as scheduled   Follow up in 5 weeks with Dr. [name] or Alanson Aly. If symptoms do not improve or worsen, please contact office for sooner follow up or seek emergency care.    Chronic respiratory failure with hypoxia (HCC) Increased use of supplemental O2. He was 100% on 2 lpm today. Advised him to monitor his oxygen levels at home; goal >88-90%. Continue PRN with activity and at night.   CHF (congestive heart failure) (HCC) Systolic CHF. Evaluated by Dr. [name] yesterday with cardiology. He was started on Jardiance, metoprolol, spironolactone, and losartan. CCB was d/c. Cardiac MRI ordered. BNP was nl. He is euvolemic on exam today.   Primary hypertension BP 140/90 today. Recently adjusted his antihypertensives.  BP is improved compared to yesterday. Advised to monitor at  home for goal <140/90.     I spent 38 minutes of dedicated to the care of this patient on the date of this encounter to include pre-visit review of records, face-to-face time with the patient discussing conditions above, post visit ordering of testing, clinical documentation with the electronic health record, making appropriate referrals as documented, and communicating necessary findings to members of the patients care team.  Clayton Bibles, NP 08/18/2022  Pt aware and understands NP's role.

## 2022-08-18 NOTE — Assessment & Plan Note (Signed)
Systolic CHF. Evaluated by Dr. [name] yesterday with cardiology. He was started on Jardiance, metoprolol, spironolactone, and losartan. CCB was d/c. Cardiac MRI ordered. BNP was nl. He is euvolemic on exam today.

## 2022-08-18 NOTE — Assessment & Plan Note (Signed)
Increased use of supplemental O2. He was 100% on 2 lpm today. Advised him to monitor his oxygen levels at home; goal >88-90%. Continue PRN with activity and at night.

## 2022-08-18 NOTE — Patient Instructions (Addendum)
Step up Trelegy to 200 mcg dose, 1 puff daily. Brush tongue and rinse mouth afterwards  Continue Albuterol inhaler 2 puffs or 3 mL neb every 6 hours as needed for shortness of breath or wheezing. Notify if symptoms persist despite rescue inhaler/neb use. Continue flonase nasal spray 2 sprays each nostril daily for allergies/nasal congestion Continue cetirizine 1 tab daily for allergies Continue supplemental O2 2 lpm at night and as needed during the day to maintain oxygen >88-90%  Prednisone taper. 4 tabs for 2 days, then 3 tabs for 2 days, 2 tabs for 2 days, then start 10 mg daily until I see you back. Take in AM with food   Follow up with cardiology as scheduled   Follow up in 2 weeks with Anthony Diesel NP and Dr. [name] in 6 weeks. If symptoms do not improve or worsen, please contact office for sooner follow up or seek emergency care.

## 2022-08-18 NOTE — Assessment & Plan Note (Signed)
BP 140/90 today. Recently adjusted his antihypertensives. BP is improved compared to yesterday. Advised to monitor at home for goal <140/90.

## 2022-08-18 NOTE — Assessment & Plan Note (Signed)
AECOPD. He has been exacerbating recently. He is very steroid responsive; flares once he comes off prednisone. Concern for possible asthma overlap given history of significantly elevated eosinophils; most recently 1000 08/17/2022. Step up Trelegy to 200. We will treat him with prednisone taper and transition him to 10 mg daily for the next 4-6 weeks to see how he responds. Discussed case with Dr. [name]; if he flares despite daily prednisone, may consider biologic therapy.   Patient Instructions  Step up Trelegy to 200 mcg dose, 1 puff daily. Brush tongue and rinse mouth afterwards  Continue Albuterol inhaler 2 puffs or 3 mL neb every 6 hours as needed for shortness of breath or wheezing. Notify if symptoms persist despite rescue inhaler/neb use. Continue flonase nasal spray 2 sprays each nostril daily for allergies/nasal congestion Continue cetirizine 1 tab daily for allergies Continue supplemental O2 2 lpm at night and as needed during the day to maintain oxygen >88-90%  Prednisone taper. 4 tabs for 2 days, then 3 tabs for 2 days, 2 tabs for 2 days, then start 10 mg daily until I see you back. Take in AM with food   Follow up with cardiology as scheduled   Follow up in 5 weeks with Dr. [name] or Alanson Aly. If symptoms do not improve or worsen, please contact office for sooner follow up or seek emergency care.

## 2022-09-01 ENCOUNTER — Other Ambulatory Visit: Payer: Self-pay

## 2022-09-01 ENCOUNTER — Encounter: Payer: Self-pay | Admitting: Nurse Practitioner

## 2022-09-01 ENCOUNTER — Ambulatory Visit (INDEPENDENT_AMBULATORY_CARE_PROVIDER_SITE_OTHER): Payer: Medicare Other | Admitting: Nurse Practitioner

## 2022-09-01 VITALS — BP 142/88 | HR 82 | Ht 69.0 in | Wt 159.6 lb

## 2022-09-01 DIAGNOSIS — J9611 Chronic respiratory failure with hypoxia: Secondary | ICD-10-CM

## 2022-09-01 DIAGNOSIS — J3089 Other allergic rhinitis: Secondary | ICD-10-CM

## 2022-09-01 DIAGNOSIS — Z23 Encounter for immunization: Secondary | ICD-10-CM

## 2022-09-01 DIAGNOSIS — Z87891 Personal history of nicotine dependence: Secondary | ICD-10-CM

## 2022-09-01 DIAGNOSIS — Z122 Encounter for screening for malignant neoplasm of respiratory organs: Secondary | ICD-10-CM

## 2022-09-01 DIAGNOSIS — J449 Chronic obstructive pulmonary disease, unspecified: Secondary | ICD-10-CM

## 2022-09-01 DIAGNOSIS — J309 Allergic rhinitis, unspecified: Secondary | ICD-10-CM | POA: Insufficient documentation

## 2022-09-01 DIAGNOSIS — I5022 Chronic systolic (congestive) heart failure: Secondary | ICD-10-CM

## 2022-09-01 MED ORDER — MONTELUKAST SODIUM 10 MG PO TABS: 10.0000 mg | ORAL_TABLET | Freq: Every day | ORAL | 5 refills | Status: DC

## 2022-09-01 NOTE — Assessment & Plan Note (Signed)
Moderate COPD. He is very steroid responsive; flares once he comes off prednisone. Concern for possible asthma overlap given history of significantly elevated eosinophils; most recently 1000 08/17/2022. We increased him to Trelegy to 200 and started him on daily prednisone after our last visit. He is significantly improved and doing well. We will try to decrease his prednisone to lowest effective dose. Add on singulair to help with trigger prevention. If he continues to exacerbate on daily prednisone, we will revisit biologic therapy.  Flu vaccine today.  Patient Instructions  Continue Trelegy to 200 mcg dose, 1 puff daily. Brush tongue and rinse mouth afterwards  Continue Albuterol inhaler 2 puffs or 3 mL neb every 6 hours as needed for shortness of breath or wheezing. Notify if symptoms persist despite rescue inhaler/neb use. Continue flonase nasal spray 2 sprays each nostril daily for allergies/nasal congestion Continue cetirizine 1 tab daily for allergies Continue supplemental O2 2 lpm at night and as needed during the day to maintain oxygen >88-90% Decrease daily prednisone to 5 mg. Take in AM with food. If you start having trouble with your breathing, increase back to 10 mg daily. If no improvement, call us for an appointment.   Singulair (montelukast) 1 tab At bedtime for allergies   Follow up in 6-8 weeks with Dr. [name] or Alanson Aly. If symptoms do not improve or worsen, please contact office for sooner follow up or seek emergency care.

## 2022-09-01 NOTE — Assessment & Plan Note (Signed)
Clinically improved. No O2 requirement during the day. 99% on room air today. Continue nightly use 2 lpm. Goal >88-90%

## 2022-09-01 NOTE — Assessment & Plan Note (Signed)
Euvolemic on exam. Follow up with Dr. [name] as scheduled.

## 2022-09-01 NOTE — Assessment & Plan Note (Signed)
Continue zyrtec and flonase. Add on singulair.

## 2022-09-01 NOTE — Patient Instructions (Addendum)
Continue Trelegy to 200 mcg dose, 1 puff daily. Brush tongue and rinse mouth afterwards  Continue Albuterol inhaler 2 puffs or 3 mL neb every 6 hours as needed for shortness of breath or wheezing. Notify if symptoms persist despite rescue inhaler/neb use. Continue flonase nasal spray 2 sprays each nostril daily for allergies/nasal congestion Continue cetirizine 1 tab daily for allergies Continue supplemental O2 2 lpm at night and as needed during the day to maintain oxygen >88-90% Decrease daily prednisone to 5 mg. Take in AM with food. If you start having trouble with your breathing, increase back to 10 mg daily. If no improvement, call us for an appointment.   Singulair (montelukast) 1 tab At bedtime for allergies   Follow up in 6-8 weeks with Dr. [name] or Alanson Aly. If symptoms do not improve or worsen, please contact office for sooner follow up or seek emergency care.

## 2022-09-01 NOTE — Progress Notes (Signed)
$'@Patient'D$  ID: [name], male    DOB: May 19, 1956, 66 y.o.   MRN: [id number]  Chief Complaint  Patient presents with   Follow-up    exacerbation of COPD f/u    Referring provider: [name], MD  HPI: 66 year old male, former smoker followed for COPD and nocturnal hypoxemia. He is a patient of Dr. [name] and last seen in office on 06/03/2022 by Dr. [name]. Past medical history significant for HTN, allergies, HLD, ED.   TEST/EVENTS:  2015 Spirometry: FVC 79%, FEV1 57% 06/02/2022 CT chest: stable RUL apical nodule and new RML curvlinear parenchymal band. Moderate centrilobular emphysema 07/21/2022 echo: EF 35%. LV global hypokinesis. RV function mildly reduced; size normal. Mild MR. 07/22/2022 CXR: hyperexpansion with emphysema. No acute process.  08/17/2022 eos 1000  06/03/2022: OV with Dr. [name] for acute visit. Increased dyspnea. Treated for AECOPD with z pack and prolonged steroid taper. Concern for asthma overlap given recurrent exacerbations and elevated eosinophils 03/2021 of 700. Recommend checking CBC with diff and IgE once off steroids. Will need CT chest follow up for parenchymal band.  07/26/2022: OV with [name] NP for 2 month follow up. He was seen in the ED recently (9/1) for increased dyspnea. Echo prior to admission showed EF of 35%. Because of this, he was advised to go to the ED for further evaluation of CHF. His CXR was without evidence of volume overload and BNP was normal. He was treated for AECOPD with prednisone burst.  He reports that he had some sort of viral illness towards the end of August. He developed a productive cough with gray sputum and chest congestion. He then started having trouble with his breathing, which he started taking prednisone 10 mg for that he had at home. He had gone for an echocardiogram on 8/31 and then his PCP on 9/1 for follow up. They recommended he go to the ER due to his symptoms and low EF.   Today, he reports feeling significantly  better. Feels like the prednisone is helping; he's on day 4 of 40 mg. Breathing feels back to baseline and he hasn't had to use his oxygen during the day. His cough has mostly resolved; occasional AM cough with clear sputum, which is normal for him. He denies any wheezing, hemoptysis, weight loss, anorexia, orthopnea, PND, leg swelling, palpitations. He continues on Trelegy daily.   08/18/2022: OV with [name] NP for follow up after being treated for COPD exacerbation and referred to cardiology for evaluated of systolic HF. He was seen by Dr. [name] yesterday. He was started on Jardiance, losartan, metoprolol and spironolactone. His amlodipine was discontinued. He is more short of breath today. He has been wearing his oxygen more; not checking his O2 but feels it helps with his breathing. He felt better when he was on the prednisone and then noticed a decline in his breathing after completing it. Chest feels a little tight and he has an occasional wheeze. He denies any significant cough, orthopnea, leg swelling, hemoptysis, fevers. He is currently on Trelegy 100. Uses albuterol daily. AECOPD. He has been exacerbating recently. He is very steroid responsive; flares once he comes off prednisone. Concern for possible asthma overlap given history of significantly elevated eosinophils; most recently 1000 08/17/2022. Step up Trelegy to 200. We will treat him with prednisone taper and transition him to 10 mg daily for the next 4-6 weeks to see how he responds. Discussed case with Dr. [name]; if he flares despite daily prednisone, may consider  biologic therapy.  09/01/2022: Today - follow up Patient presents today for follow up. He feels significantly better than when he was here last. He actually tells me that he's the best he's been in years. Feels like he's been able to do and isn't getting winded, included mowing the grass/working outside. He's very pleased with his progress. He denies any chest congestion, cough,  wheezing. He has not had any trouble with leg swelling or orthopnea. He is using Trelegy 200 daily. Hasn't had to use his albuterol. He's on cetirizine and flonase for allergies. He's taking prednisone 10 mg daily.   Allergies  Allergen Reactions   Crestor [Rosuvastatin Calcium] Shortness Of Breath   Zestril [Lisinopril] Swelling   Spiriva Respimat [Tiotropium Bromide Monohydrate] Other (See Comments)    Red, glassy eyes   Tiotropium Other (See Comments)    Red glassy eyes     Immunization History  Administered Date(s) Administered   Fluad Quad(high Dose 65+) 08/07/2020, 09/14/2021, 09/01/2022   H1N1 12/02/2008   Influenza Whole 09/06/2007, 12/02/2008   Influenza, Seasonal, Injecte, Preservative Fre 08/01/2014   Influenza,inj,Quad PF,6+ Mos 09/04/2015, 09/01/2018, 10/09/2019   Influenza-Unspecified 08/21/2013   PFIZER(Purple Top)SARS-COV-2 Vaccination 03/05/2020, 03/30/2020, 09/30/2020   Pfizer Covid-19 Vaccine Bivalent Booster 50yr & up 10/18/2021   Pneumococcal Conjugate-13 10/23/2015   Pneumococcal Polysaccharide-23 08/21/2004, 08/28/2020   Td 01/19/2006   Zoster, Live 12/04/2015    Past Medical History:  Diagnosis Date   Asthma    COPD (chronic obstructive pulmonary disease) (HCC)    Hypertension    Shortness of breath     Tobacco History: Social History   Tobacco Use  Smoking Status Former   Packs/day: 0.50   Years: 30.00   Total pack years: 15.00   Types: Cigarettes   Quit date: 08/25/2020   Years since quitting: 2.0  Smokeless Tobacco Never   Counseling given: Not Answered   Outpatient Medications Prior to Visit  Medication Sig Dispense Refill   acetaminophen (TYLENOL) 500 MG tablet Take 1,000 mg by mouth every 6 (six) hours as needed for mild pain or moderate pain.     albuterol (VENTOLIN HFA) 108 (90 Base) MCG/ACT inhaler INHALE 2 PUFFS EVERY 6 HOURS AS NEEDED FOR WHEEZING OR SHORTNESS OF BREATH 18 g 1   cetirizine (ZYRTEC) 10 MG tablet Take 1 tablet  (10 mg total) by mouth daily. 30 tablet 11   empagliflozin (JARDIANCE) 10 MG TABS tablet Take 1 tablet (10 mg total) by mouth daily before breakfast. 90 tablet 3   fluticasone (FLONASE) 50 MCG/ACT nasal spray Place 2 sprays into both nostrils daily. 1 spray in each nostril every day 16 g 12   Fluticasone-Umeclidin-Vilant (TRELEGY ELLIPTA) 100-62.5-25 MCG/ACT AEPB TAKE 1 PUFF BY MOUTH EVERY DAY 60 each 5   ipratropium-albuterol (DUONEB) 0.5-2.5 (3) MG/3ML SOLN INHALE 1 VIAL VIA NEBULIZER EVERY 6 HOURS AS NEEDED FOR SHORTNESS OF BREATH /  WHEEZING 270 mL 11   losartan (COZAAR) 25 MG tablet Take 1 tablet (25 mg total) by mouth daily. 90 tablet 3   metoprolol succinate (TOPROL XL) 25 MG 24 hr tablet Take 0.5 tablets (12.5 mg total) by mouth daily. 90 tablet 3   predniSONE (DELTASONE) 10 MG tablet Take 1 tablet (10 mg total) by mouth daily with breakfast. 30 tablet 0   spironolactone (ALDACTONE) 25 MG tablet Take 0.5 tablets (12.5 mg total) by mouth daily. 45 tablet 3   Fluticasone-Umeclidin-Vilant (TRELEGY ELLIPTA) 200-62.5-25 MCG/ACT AEPB Inhale 1 each into the lungs daily. 1 each  0   predniSONE (DELTASONE) 10 MG tablet 4 tabs for 2 days, then 3 tabs for 2 days, 2 tabs for 2 days, then start 10 mg daily (Patient not taking: Reported on 09/01/2022) 20 tablet 0   No facility-administered medications prior to visit.     Review of Systems:   Constitutional: No weight loss or gain, night sweats, fevers, chills, fatigue, or lassitude. HEENT: No headaches, difficulty swallowing, tooth/dental problems, or sore throat.  +sneezing. No itching, ear ache, +nasal congestion, or post nasal drip CV:  No chest pain, orthopnea, PND, swelling in lower extremities, anasarca, dizziness, palpitations, syncope Resp: No shortness of breath with exertion; chest tightness. No wheeze. No cough. No excess mucus or change in color of mucus. No hemoptysis.  No chest wall deformity GI:  No heartburn, indigestion, abdominal  pain, nausea, vomiting, diarrhea, change in bowel habits, loss of appetite, bloody stools.  MSK:  No joint pain or swelling.  No decreased range of motion.  No back pain. Neuro: No dizziness or lightheadedness.  Psych: No depression or anxiety. Mood stable.     Physical Exam:  BP (!) 142/88 (BP Location: Right Arm, Cuff Size: Normal)   Pulse 82   Ht '5\' 9"'$  (1.753 m)   Wt 159 lb 9.6 oz (72.4 kg)   SpO2 99%   BMI 23.57 kg/m   GEN: Pleasant, interactive, well-appearing; in no acute distress HEENT:  Normocephalic and atraumatic. PERRLA. Sclera white. Nasal turbinates pink, moist and patent bilaterally. No rhinorrhea present. Oropharynx pink and moist, without exudate or edema. No lesions, ulcerations, or postnasal drip.  NECK:  Supple w/ fair ROM. No JVD present. No lymphadenopathy.   CV: RRR, no m/r/g, no peripheral edema. Pulses intact, +2 bilaterally. No cyanosis, pallor or clubbing. PULMONARY:  Unlabored, regular breathing. Clear bilaterally A&P w/o wheezes/rales/rhonchi. No accessory muscle use. No dullness to percussion. GI: BS present and normoactive. Soft, non-tender to palpation. No organomegaly or masses detected  MSK: No erythema, warmth or tenderness.  Neuro: A/Ox3. No focal deficits noted.   Skin: Warm, no lesions or rashe Psych: Normal affect and behavior. Judgement and thought content appropriate.     Lab Results:  CBC    Component Value Date/Time   WBC 6.4 08/17/2022 0924   WBC 7.1 07/22/2022 1034   RBC 4.29 08/17/2022 0924   RBC 4.54 07/22/2022 1034   HGB 14.1 08/17/2022 0924   HCT 42.0 08/17/2022 0924   PLT 285 08/17/2022 0924   MCV 98 (H) 08/17/2022 0924   MCH 32.9 08/17/2022 0924   MCH 32.6 07/22/2022 1034   MCHC 33.6 08/17/2022 0924   MCHC 33.9 07/22/2022 1034   RDW 12.3 08/17/2022 0924   LYMPHSABS 2.8 08/17/2022 0924   MONOABS 1.7 (H) 03/27/2021 0250   EOSABS 1.0 (H) 08/17/2022 0924   BASOSABS 0.0 08/17/2022 0924    BMET    Component Value  Date/Time   NA 142 08/17/2022 0924   K 4.4 08/17/2022 0924   CL 105 08/17/2022 0924   CO2 22 08/17/2022 0924   GLUCOSE 66 (L) 08/17/2022 0924   GLUCOSE 71 07/22/2022 1034   BUN 13 08/17/2022 0924   CREATININE 1.40 (H) 08/17/2022 0924   CALCIUM 9.6 08/17/2022 0924   GFRNONAA >60 07/22/2022 1034   GFRAA 64 11/10/2020 1552    BNP    Component Value Date/Time   BNP 22.7 07/22/2022 1033     Imaging:  No results found.        No data to  display          No results found for: "NITRICOXIDE"      Assessment & Plan:   COPD (chronic obstructive pulmonary disease) (HCC) Moderate COPD. He is very steroid responsive; flares once he comes off prednisone. Concern for possible asthma overlap given history of significantly elevated eosinophils; most recently 1000 08/17/2022. We increased him to Trelegy to 200 and started him on daily prednisone after our last visit. He is significantly improved and doing well. We will try to decrease his prednisone to lowest effective dose. Add on singulair to help with trigger prevention. If he continues to exacerbate on daily prednisone, we will revisit biologic therapy.  Flu vaccine today.  Patient Instructions  Continue Trelegy to 200 mcg dose, 1 puff daily. Brush tongue and rinse mouth afterwards  Continue Albuterol inhaler 2 puffs or 3 mL neb every 6 hours as needed for shortness of breath or wheezing. Notify if symptoms persist despite rescue inhaler/neb use. Continue flonase nasal spray 2 sprays each nostril daily for allergies/nasal congestion Continue cetirizine 1 tab daily for allergies Continue supplemental O2 2 lpm at night and as needed during the day to maintain oxygen >88-90% Decrease daily prednisone to 5 mg. Take in AM with food. If you start having trouble with your breathing, increase back to 10 mg daily. If no improvement, call us for an appointment.   Singulair (montelukast) 1 tab At bedtime for allergies   Follow up in 6-8  weeks with Dr. [name] or Alanson Aly. If symptoms do not improve or worsen, please contact office for sooner follow up or seek emergency care.    Allergic rhinitis Continue zyrtec and flonase. Add on singulair.   Chronic respiratory failure with hypoxia (HCC) Clinically improved. No O2 requirement during the day. 99% on room air today. Continue nightly use 2 lpm. Goal >88-90%  CHF (congestive heart failure) (HCC) Euvolemic on exam. Follow up with Dr. [name] as scheduled.      I spent 31 minutes of dedicated to the care of this patient on the date of this encounter to include pre-visit review of records, face-to-face time with the patient discussing conditions above, post visit ordering of testing, clinical documentation with the electronic health record, making appropriate referrals as documented, and communicating necessary findings to members of the patients care team.  Clayton Bibles, NP 09/01/2022  Pt aware and understands NP's role.

## 2022-09-09 ENCOUNTER — Telehealth: Payer: Self-pay | Admitting: Nurse Practitioner

## 2022-09-09 NOTE — Telephone Encounter (Signed)
Attempted to call pt but unable to reach. Unable to leave VM as after 4 rings, the line goes to a busy tone. Tried to call again and the same thing happened. Will try to call back later.

## 2022-09-12 NOTE — Telephone Encounter (Signed)
Tried calling again, no answer- line rings many times and then turns to busy signal  Please offer pt appt if he calls back

## 2022-09-13 ENCOUNTER — Other Ambulatory Visit: Payer: Self-pay | Admitting: Nurse Practitioner

## 2022-09-13 DIAGNOSIS — J441 Chronic obstructive pulmonary disease with (acute) exacerbation: Secondary | ICD-10-CM

## 2022-09-13 DIAGNOSIS — J45901 Unspecified asthma with (acute) exacerbation: Secondary | ICD-10-CM

## 2022-09-15 ENCOUNTER — Telehealth: Payer: Self-pay | Admitting: Nurse Practitioner

## 2022-09-15 DIAGNOSIS — J45901 Unspecified asthma with (acute) exacerbation: Secondary | ICD-10-CM

## 2022-09-15 MED ORDER — PREDNISONE 10 MG PO TABS: 10.0000 mg | ORAL_TABLET | Freq: Every day | ORAL | 2 refills | Status: DC

## 2022-09-15 NOTE — Telephone Encounter (Signed)
Called and spoke with pt who states he has been having problems with increased SOB. States that he has been off of prednisone for about a week. Pt is supposed to be on '10mg'$  prednisone daily. Rx has been sent to pharmacy. Nothing further needed.

## 2022-09-28 ENCOUNTER — Other Ambulatory Visit: Payer: Self-pay | Admitting: Student

## 2022-10-14 ENCOUNTER — Ambulatory Visit: Payer: Medicare Other | Admitting: Nurse Practitioner

## 2022-10-25 ENCOUNTER — Ambulatory Visit: Payer: Medicare Other | Admitting: Nurse Practitioner

## 2022-10-31 ENCOUNTER — Other Ambulatory Visit: Payer: Self-pay | Admitting: Nurse Practitioner

## 2022-10-31 DIAGNOSIS — J449 Chronic obstructive pulmonary disease, unspecified: Secondary | ICD-10-CM

## 2022-11-01 ENCOUNTER — Telehealth: Payer: Self-pay | Admitting: Nurse Practitioner

## 2022-11-01 ENCOUNTER — Ambulatory Visit: Payer: Medicare Other | Admitting: Adult Health

## 2022-11-01 MED ORDER — PREDNISONE 10 MG PO TABS: ORAL_TABLET | ORAL | 0 refills | Status: DC

## 2022-11-01 MED ORDER — AZITHROMYCIN 250 MG PO TABS: 250.0000 mg | ORAL_TABLET | ORAL | 0 refills | Status: DC

## 2022-11-01 NOTE — Telephone Encounter (Signed)
Spoke with the pt  He is c/o increased SOB, cough with clear sputum, wheezing and throat irritation x 3-4 days  He checked his sats on the phone with me and he was at 97% 2lpm o2  He denies any fevers, aches, edema  Last dose of pred approx 1 mon ago  He takes singulair, trelegy, albuterol hfa, duoneb   Dr [name], please advise, thanks!  Allergies  Allergen Reactions   Crestor [Rosuvastatin Calcium] Shortness Of Breath   Zestril [Lisinopril] Swelling   Spiriva Respimat [Tiotropium Bromide Monohydrate] Other (See Comments)    Red, glassy eyes   Tiotropium Other (See Comments)    Red glassy eyes

## 2022-11-01 NOTE — Telephone Encounter (Signed)
Rigoberto Noel, MD  to Me     11/01/22 10:56 AM  Prednisone 10 mg tabs Take 4 tabs  daily with food x 4 days, then 3 tabs daily x 4 days, then 2 tabs daily x 4 days, then 1 tab daily x4 days then stop. #40  Zpak to take if sputum changes color  Covid/flu testing if fever  I called and spoke with the pt and notified of response per Dr [name]. Pt verbalized understanding. Nothing further needed. Rxs sent to pharm.

## 2022-11-08 ENCOUNTER — Encounter: Payer: Self-pay | Admitting: Student

## 2022-11-08 ENCOUNTER — Ambulatory Visit (INDEPENDENT_AMBULATORY_CARE_PROVIDER_SITE_OTHER): Payer: Medicare Other | Admitting: Acute Care

## 2022-11-08 ENCOUNTER — Other Ambulatory Visit: Payer: Self-pay

## 2022-11-08 ENCOUNTER — Encounter: Payer: Self-pay | Admitting: Acute Care

## 2022-11-08 ENCOUNTER — Ambulatory Visit (INDEPENDENT_AMBULATORY_CARE_PROVIDER_SITE_OTHER): Payer: Medicare Other | Admitting: Student

## 2022-11-08 VITALS — BP 187/115 | HR 90 | Wt 159.4 lb

## 2022-11-08 DIAGNOSIS — Z1211 Encounter for screening for malignant neoplasm of colon: Secondary | ICD-10-CM

## 2022-11-08 DIAGNOSIS — I5022 Chronic systolic (congestive) heart failure: Secondary | ICD-10-CM

## 2022-11-08 DIAGNOSIS — J9611 Chronic respiratory failure with hypoxia: Secondary | ICD-10-CM | POA: Diagnosis not present

## 2022-11-08 DIAGNOSIS — Z86010 Personal history of colonic polyps: Secondary | ICD-10-CM

## 2022-11-08 DIAGNOSIS — Z87891 Personal history of nicotine dependence: Secondary | ICD-10-CM

## 2022-11-08 DIAGNOSIS — I10 Essential (primary) hypertension: Secondary | ICD-10-CM

## 2022-11-08 DIAGNOSIS — J449 Chronic obstructive pulmonary disease, unspecified: Secondary | ICD-10-CM | POA: Diagnosis not present

## 2022-11-08 DIAGNOSIS — E785 Hyperlipidemia, unspecified: Secondary | ICD-10-CM

## 2022-11-08 MED ORDER — LOSARTAN POTASSIUM-HCTZ 50-12.5 MG PO TABS: 1.0000 | ORAL_TABLET | Freq: Every day | ORAL | 1 refills | Status: DC

## 2022-11-08 NOTE — Patient Instructions (Signed)
Thank you for participating in the [facility]. It was our pleasure to meet you today. We will call you with the results of your scan within the next few days. Your scan will be assigned a Lung RADS category score by the physicians reading the scans.  This Lung RADS score determines follow up scanning.  See below for description of categories, and follow up screening recommendations. We will be in touch to schedule your follow up screening annually or based on recommendations of our providers. We will fax a copy of your scan results to your Primary Care Physician, or the physician who referred you to the program, to ensure they have the results. Please call the office if you have any questions or concerns regarding your scanning experience or results.  Our office number is [phone]. Please speak with Denise Phelps, RN. , or  Denise Buckner RN, They are  our Lung Cancer Screening RN.'s If They are unavailable when you call, Please leave a message on the voice mail. We will return your call at our earliest convenience.This voice mail is monitored several times a day.  Remember, if your scan is normal, we will scan you annually as long as you continue to meet the criteria for the program. (Age 55-77, Current smoker or smoker who has quit within the last 15 years). If you are a smoker, remember, quitting is the single most powerful action that you can take to decrease your risk of lung cancer and other pulmonary, breathing related problems. We know quitting is hard, and we are here to help.  Please let us know if there is anything we can do to help you meet your goal of quitting. If you are a former smoker, congratulations. We are proud of you! Remain smoke free! Remember you can refer friends or family members through the number above.  We will screen them to make sure they meet criteria for the program. Thank you for helping us take better care of you by  participating in Lung Screening.  You can receive free nicotine replacement therapy ( patches, gum or mints) by calling 1-800-QUIT NOW. Please call so we can get you on the path to becoming  a non-smoker. I know it is hard, but you can do this!  Lung RADS Categories:  Lung RADS 1: no nodules or definitely non-concerning nodules.  Recommendation is for a repeat annual scan in 12 months.  Lung RADS 2:  nodules that are non-concerning in appearance and behavior with a very low likelihood of becoming an active cancer. Recommendation is for a repeat annual scan in 12 months.  Lung RADS 3: nodules that are probably non-concerning , includes nodules with a low likelihood of becoming an active cancer.  Recommendation is for a 6-month repeat screening scan. Often noted after an upper respiratory illness. We will be in touch to make sure you have no questions, and to schedule your 6-month scan.  Lung RADS 4 A: nodules with concerning findings, recommendation is most often for a follow up scan in 3 months or additional testing based on our provider's assessment of the scan. We will be in touch to make sure you have no questions and to schedule the recommended 3 month follow up scan.  Lung RADS 4 B:  indicates findings that are concerning. We will be in touch with you to schedule additional diagnostic testing based on our provider's  assessment of the scan.  Other options for assistance in smoking cessation (   As covered by your insurance benefits)  Hypnosis for smoking cessation  [employer] [phone]  Acupuncture for smoking cessation  [facility] [phone]   

## 2022-11-08 NOTE — Progress Notes (Signed)
Virtual Visit via Telephone Note  I connected with [name] on 11/08/22 at  1:30 PM EST by telephone and verified that I am speaking with the correct person using two identifiers.  Location: Patient:  At home Provider: [address], [place], [place], [address]    I discussed the limitations, risks, security and privacy concerns of performing an evaluation and management service by telephone and the availability of in person appointments. I also discussed with the patient that there may be a patient responsible charge related to this service. The patient expressed understanding and agreed to proceed.    Shared Decision Making Visit Lung Cancer Screening Program [phone])   Eligibility: Age 66 y.o. Pack Years Smoking History Calculation 30 pack year smoking history (# packs/per year x # years smoked) Recent History of coughing up blood  no Unexplained weight loss? no ( >Than 15 pounds within the last 6 months ) Prior History Lung / other cancer no (Diagnosis within the last 5 years already requiring surveillance chest CT Scans). Smoking Status Former Smoker Former Smokers: Years since quit: 2 years  Quit Date: 08/25/2020  Visit Components: Discussion included one or more decision making aids. yes Discussion included risk/benefits of screening. yes Discussion included potential follow up diagnostic testing for abnormal scans. yes Discussion included meaning and risk of over diagnosis. yes Discussion included meaning and risk of False Positives. yes Discussion included meaning of total radiation exposure. yes  Counseling Included: Importance of adherence to annual lung cancer LDCT screening. yes Impact of comorbidities on ability to participate in the program. yes Ability and willingness to under diagnostic treatment. yes  Smoking Cessation Counseling: Current Smokers:  Discussed importance of smoking cessation. yes Information about tobacco cessation classes and  interventions provided to patient. yes Patient provided with "ticket" for LDCT Scan. yes Symptomatic Patient. no  Counseling NA Diagnosis Code: Tobacco Use Z72.0 Asymptomatic Patient yes  Counseling (Intermediate counseling: > three minutes counseling) L4917 Former Smokers:  Discussed the importance of maintaining cigarette abstinence. yes Diagnosis Code: Personal History of Nicotine Dependence. H15.056 Information about tobacco cessation classes and interventions provided to patient. Yes Patient provided with "ticket" for LDCT Scan. yes Written Order for Lung Cancer Screening with LDCT placed in Epic. Yes (CT Chest Lung Cancer Screening Low Dose W/O CM) [id number] Z12.2-Screening of respiratory organs Z87.891-Personal history of nicotine dependence  I spent 25 minutes of face to face time/virtual visit time  with  [name] discussing the risks and benefits of lung cancer screening. We took the time to pause the power point at intervals to allow for questions to be asked and answered to ensure understanding. We discussed that he had taken the single most powerful action possible to decrease his risk of developing lung cancer when he quit smoking. I counseled him to remain smoke free, and to contact me if he ever had the desire to smoke again so that I can provide resources and tools to help support the effort to remain smoke free. We discussed the time and location of the scan, and that either  [name], [name], RN or I  or I will call / send a letter with the results within  24-72 hours of receiving them. He has the office contact information in the event he needs to speak with me,  he verbalized understanding of all of the above and had no further questions upon leaving the office.     I explained to the patient that there  has been a high incidence of coronary artery disease noted on these exams. I explained that this is a non-gated exam therefore degree or severity cannot be  determined. This patient is on statin therapy. I have asked the patient to follow-up with their PCP regarding any incidental finding of coronary artery disease and management with diet or medication as they feel is clinically indicated. The patient verbalized understanding of the above and had no further questions.     Magdalen Spatz, NP 11/08/2022

## 2022-11-08 NOTE — Progress Notes (Unsigned)
  SUBJECTIVE:   CHIEF COMPLAINT / HPI:   [facility]  Health Issues:  HTN: Last BPs were elevated, however patient was on steroids for COPD exacerbation BP Readings from Last 3 Encounters:  11/08/22 (!) 187/115  09/01/22 (!) 142/88  08/18/22 (!) 140/90  Meds: Metoprolol XR 25, Losartan 25 mg   CHF: EF 35% unknown etiology.  Saw Cards 08/17/22, started on GDMT, f/u 12/17/22,  MRI cardiac morphology sched 12/07/22 Meds: Metop XR 25 mg, Jardiance 10 mg, Spironolactone 12.5 mg, losartan 25 mg  COPD: Moderate COPD w/ hypoxemia at night om 2L O2 qhs Continues to have COPD exacerbations off of steroids, if continues on steroids is considering biologics Saw Pulm 09/01/22, follow up prn Meds: Trellergy 200 mcg, Prednisone 5-10 mg, Flonase 50 mcg, Zyrtec 10 mg, Singulair 10 mg, albuterol prn, O2 @ 2 lpm prn/qhs Taking the 10 mg of prednisone with good results, 5 mg was too low, still symptomatic Reports: sleeping on O2 at night as needed, used during the day as needed. Using albuterol in am/pm  HLD: On Crestor 10, patient experienced SOB w/ previous dose of Crestor Lab Results  Component Value Date   CHOL 221 (H) 03/31/2022   HDL 59 03/31/2022   LDLCALC 142 (H) 03/31/2022   TRIG 110 03/31/2022   CHOLHDL 3.7 03/31/2022  *Consider increasing dose   Health [facility]: Colonoscopy Tdap  PERTINENT  PMH / PSH:    OBJECTIVE:  BP (!) 187/115   Pulse 90   Wt 159 lb 6.4 oz (72.3 kg)   SpO2 99%   BMI 23.54 kg/m  Physical Exam Constitutional:      Appearance: Normal appearance.  Cardiovascular:     Rate and Rhythm: Normal rate and regular rhythm.     Pulses: Normal pulses.     Heart sounds: Normal heart sounds. No murmur heard.    No friction rub. No gallop.  Pulmonary:     Effort: Pulmonary effort is normal. No respiratory distress.     Breath sounds: Normal breath sounds. No stridor. No wheezing, rhonchi or rales.  Abdominal:     General: Bowel sounds are normal. There is no  distension.     Palpations: Abdomen is soft. There is no mass.     Tenderness: There is no abdominal tenderness.  Skin:    Capillary Refill: Capillary refill takes less than 2 seconds.  Neurological:     Mental Status: He is alert.      ASSESSMENT/PLAN:  Screening for colon cancer -     Ambulatory referral to Gastroenterology  Primary hypertension -     Basic metabolic panel; Future  Other orders -     Tdap vaccine greater than or equal to 7yo IM -     Losartan Potassium-HCTZ; Take 1 tablet by mouth daily.  Dispense: 30 tablet; Refill: 1   No follow-ups on file. [name], MD 11/08/2022, 5:04 PM PGY-2, [facility] {    This will disappear when note is signed, click to select method of visit    :1}

## 2022-11-08 NOTE — Patient Instructions (Signed)
It was great to see you! Thank you for allowing me to participate in your care!  I recommend that you always bring your medications to each appointment as this makes it easy to ensure we are on the correct medications and helps [place] not miss when refills are needed.  Our plans for today:  - High blood pressure  We are starting you on a combination pill for your blood pressure  Take pill daily  Come for a blood pressure check and labs in 1 week  (Bring all medications to this visit!)  - Cholesterol  Check at home to see if you are on Crestor 10 mg Increasing dose of Crestor to 20 mg (can take 2 of the 10 mg at home)  Call me if you have any problems with the medication  Take care and seek immediate care sooner if you develop any concerns.   Dr. [name], MD [facility]

## 2022-11-09 NOTE — Assessment & Plan Note (Signed)
EF 35%, Patient followed by cardiology, last seen 08/17/22 and started on GDMT. Patient to have cardiac morphology MRI 12/07/22 and f/u in their clinic 12/17/22 -Continue Metoprolol XR 25 mg, Jardiance 10 mg, Spironolactone 12.5 mg, losartan 25 mg -Cardiac MRI January -f/u January

## 2022-11-09 NOTE — Assessment & Plan Note (Signed)
Patient due for colonoscopy. Referral placed -Amb Ref GI

## 2022-11-09 NOTE — Assessment & Plan Note (Signed)
Patient reports improvement in respiratory status. Only using O2 prn and no only using it prn at night, since being on 10 mg prednisone daily.  -continue to monitor -O2 prn

## 2022-11-09 NOTE — Assessment & Plan Note (Signed)
Patient following w/ Pulmonology, noted to have multiple AECOPD this year and started on chronic steroids. If still having exacerbations while on steroids, Pulm will consider biologics. Patient taking 10 mg prednisone daily, per pulmonology, as 5 mg daily still left him symptomatic. Pulm also notes asthma component to COPD, further supporting use of steroids. Patient feeling a doing well. Reports only using O2 prn -Continue Trellergy -Continue O2 prn -Continue Prednisone 10 mg daily -Continue Flonase 50 mcg -Continue Zyrtec 10 mg -Continue Singulair 10 mg -F/u Pulm PRN

## 2022-11-09 NOTE — Assessment & Plan Note (Signed)
Patient last lipid panel not at goal, with ldl 142 and chol 221. Patient reports he's still on 10 mg Crestor, dose had been decreased as patient thought it was causing SOB. Now that COPD is better controlled will increase dose of crestor to 20 mg. -Crestor 20 mg daily

## 2022-11-09 NOTE — Assessment & Plan Note (Signed)
Patient BP severely elevated today, 187/115, w/ no complaints of CP/SOB. Patient BP has been elevated in past, thought to be 2/2 steroids. Patient will be on steroids for long term use now, and BP will need better control. -Start combo pill HCTZ 12.5 mg/Losartan 50 mg -Continue metoprolol XR 25 -f/u 1 wk for BP check and BMP

## 2022-11-15 ENCOUNTER — Ambulatory Visit (HOSPITAL_COMMUNITY): Admission: RE | Admit: 2022-11-15 | Payer: Medicare Other | Source: Ambulatory Visit

## 2022-11-16 ENCOUNTER — Other Ambulatory Visit: Payer: Self-pay

## 2022-11-16 MED ORDER — TRELEGY ELLIPTA 200-62.5-25 MCG/ACT IN AEPB: 1.0000 | INHALATION_SPRAY | Freq: Every day | RESPIRATORY_TRACT | 3 refills | Status: DC

## 2022-11-17 ENCOUNTER — Ambulatory Visit: Payer: Medicare Other | Admitting: Student

## 2022-11-17 MED ORDER — TRELEGY ELLIPTA 200-62.5-25 MCG/ACT IN AEPB: 1.0000 | INHALATION_SPRAY | Freq: Every day | RESPIRATORY_TRACT | 3 refills | Status: DC

## 2022-11-17 NOTE — Addendum Note (Signed)
Addended by: Talbot Grumbling on: 11/17/2022 11:44 AM   Modules accepted: Orders

## 2022-11-17 NOTE — Telephone Encounter (Signed)
Received message from pharmacy regarding Trelegy prescription. Patient attempted to pick up today, however, they did not receive prescription. Upon chart review, medication was ordered as sample, therefore, was not received by pharmacy.   I have pended the medication to the encounter. Dispense quantity has been updated to 60 each, as dispense packs come in 28 or 60.   Forwarding to PCP to resend.   Talbot Grumbling, RN

## 2022-11-17 NOTE — Patient Instructions (Incomplete)
It was great to see you! Thank you for allowing me to participate in your care!  I recommend that you always bring your medications to each appointment as this makes it easy to ensure we are on the correct medications and helps us not miss when refills are needed.  Our plans for today:  - *** -   We are checking some labs today, I will call you if they are abnormal will send you a MyChart message or a letter if they are normal.  If you do not hear about your labs in the next 2 weeks please let us know.***  Take care and seek immediate care sooner if you develop any concerns.   Dr. Daquarius Dubeau, MD [facility]  

## 2022-11-17 NOTE — Progress Notes (Deleted)
  SUBJECTIVE:   CHIEF COMPLAINT / HPI:   HTN f/u BP Readings from Last 3 Encounters:  11/08/22 (!) 187/115  09/01/22 (!) 142/88  08/18/22 (!) 140/90  Meds: combo pill HCTZ 12.5 mg/Losartan 50 mg, metoprolol XR 25     PERTINENT  PMH / PSH: HTN, COPD, CHF   OBJECTIVE:  There were no vitals taken for this visit. Physical Exam   ASSESSMENT/PLAN:  There are no diagnoses linked to this encounter. No follow-ups on file. [name], MD 11/17/2022, 7:32 AM PGY-***, [facility] {    This will disappear when note is signed, click to select method of visit    :1}

## 2022-11-25 ENCOUNTER — Telehealth (HOSPITAL_COMMUNITY): Payer: Self-pay | Admitting: *Deleted

## 2022-11-25 ENCOUNTER — Ambulatory Visit: Payer: Medicare PPO | Attending: Internal Medicine | Admitting: Internal Medicine

## 2022-11-25 NOTE — Telephone Encounter (Signed)
Attempted to call patient regarding upcoming cardiac MRI appointment. Left message on voicemail with name and callback number  Braiden Rodman RN Navigator Cardiac Imaging [facility] [phone] Office [phone] Cell  

## 2022-11-28 ENCOUNTER — Telehealth: Payer: Self-pay | Admitting: Internal Medicine

## 2022-11-28 ENCOUNTER — Ambulatory Visit (HOSPITAL_COMMUNITY): Admission: RE | Admit: 2022-11-28 | Payer: Medicare PPO | Source: Ambulatory Visit

## 2022-11-28 NOTE — Telephone Encounter (Signed)
Attempted to contact patient back, left call back number.

## 2022-11-28 NOTE — Telephone Encounter (Signed)
Patient contacted and instructed to call team to get test reschedule. Provided phone numbers.

## 2022-11-28 NOTE — Telephone Encounter (Signed)
Patient stated he is following-up on the status of his prior approval to get a MR CARDIAC MORPHOLOGY W WO CONTRAST test.

## 2022-11-28 NOTE — Telephone Encounter (Signed)
Patient was returning call. Please advise ?

## 2022-12-01 ENCOUNTER — Other Ambulatory Visit: Payer: Self-pay | Admitting: Student

## 2022-12-07 ENCOUNTER — Ambulatory Visit
Admission: RE | Admit: 2022-12-07 | Discharge: 2022-12-07 | Disposition: A | Discharge status: Home / Self Care | Payer: Medicare PPO | Source: Ambulatory Visit | Attending: Acute Care | Admitting: Acute Care

## 2022-12-07 DIAGNOSIS — Z122 Encounter for screening for malignant neoplasm of respiratory organs: Secondary | ICD-10-CM

## 2022-12-07 DIAGNOSIS — Z87891 Personal history of nicotine dependence: Secondary | ICD-10-CM

## 2022-12-09 ENCOUNTER — Other Ambulatory Visit: Payer: Self-pay

## 2022-12-09 DIAGNOSIS — Z87891 Personal history of nicotine dependence: Secondary | ICD-10-CM

## 2022-12-09 DIAGNOSIS — Z122 Encounter for screening for malignant neoplasm of respiratory organs: Secondary | ICD-10-CM

## 2022-12-19 ENCOUNTER — Telehealth: Payer: Self-pay | Admitting: Student

## 2022-12-19 NOTE — Telephone Encounter (Signed)
Left message for patient to call back and schedule Medicare Annual Wellness Visit (AWV).   Please offer to do virtually or by telephone.  Left office number and my jabber #[phone].  AWVI eligible as of  11/21/2009  Please schedule at anytime with Nurse Health Advisor.   

## 2022-12-26 ENCOUNTER — Ambulatory Visit (INDEPENDENT_AMBULATORY_CARE_PROVIDER_SITE_OTHER): Payer: Medicare PPO | Admitting: Nurse Practitioner

## 2022-12-26 ENCOUNTER — Encounter: Payer: Self-pay | Admitting: Nurse Practitioner

## 2022-12-26 VITALS — BP 130/86 | HR 82 | Ht 69.0 in | Wt 160.0 lb

## 2022-12-26 DIAGNOSIS — J9611 Chronic respiratory failure with hypoxia: Secondary | ICD-10-CM | POA: Diagnosis not present

## 2022-12-26 DIAGNOSIS — J449 Chronic obstructive pulmonary disease, unspecified: Secondary | ICD-10-CM

## 2022-12-26 DIAGNOSIS — Z87891 Personal history of nicotine dependence: Secondary | ICD-10-CM

## 2022-12-26 NOTE — Assessment & Plan Note (Signed)
Stable without recent O2 use. Inconsistent use of nocturnal therapy. He would like to re-evaluate to see if he still needs this. ONO on room air ordered today. Goal >88-90%

## 2022-12-26 NOTE — Patient Instructions (Addendum)
Continue Trelegy to 200 mcg dose, 1 puff daily. Brush tongue and rinse mouth afterwards  Continue Albuterol inhaler 2 puffs or 3 mL neb every 6 hours as needed for shortness of breath or wheezing. Notify if symptoms persist despite rescue inhaler/neb use. Continue flonase nasal spray 2 sprays each nostril daily for allergies/nasal congestion Continue cetirizine 1 tab daily for allergies Continue supplemental O2 2 lpm at night and as needed during the day to maintain oxygen >88-90% Decrease daily prednisone to 5 mg. Take in AM with food. If you start having trouble with your breathing, increase back to 10 mg daily. If no improvement, call us for an appointment.  Continue Singulair (montelukast) 1 tab At bedtime for allergies   Next CT chest January 2025   Follow up in 3-4 months with Dr. [name] or Alanson Aly. If symptoms worsen, please contact office for sooner follow up or seek emergency care

## 2022-12-26 NOTE — Progress Notes (Signed)
$'@Patient'f$  ID: [name], male    DOB: May 15, 1956, 67 y.o.   MRN: [id number]  Chief Complaint  Patient presents with   Follow-up    Pt f/u he states that he is breathing fine, just here to discuss CT results    Referring provider: [name], MD  HPI: 67 year old male, former smoker followed for COPD and nocturnal hypoxemia. He is a patient of Dr. [name] and last seen in office on 09/01/2022. Past medical history significant for HTN, allergies, HLD, ED.   TEST/EVENTS:  2015 Spirometry: FVC 79%, FEV1 57% 06/02/2022 CT chest: stable RUL apical nodule and new RML curvlinear parenchymal band. Moderate centrilobular emphysema 07/21/2022 echo: EF 35%. LV global hypokinesis. RV function mildly reduced; size normal. Mild MR. 07/22/2022 CXR: hyperexpansion with emphysema. No acute process.  08/17/2022 eos 1000 12/07/2022 LDCT chest: atherosclerosis. No LAD. Moderate centrilobular emphysema with diffuse bronchial wall thickening. Stable curvilinear mildly thickened parenchymal band in the medial RML, consistent with scarring. Mildly thickened small curvilinear parenchymal band in the ligula is increased in thickness, remains compatible with scarring vs atelectasis. Additional smaller thin parenchymal bands scatted in dependent lung bases b/l compatible with mild scarring. Previous small subpleural triangular opacity in the RUL, unchanged and compatible with nonspecific scarring. Lung RADS 1. Scattered simple cysts.   06/03/2022: OV with Dr. [name] for acute visit. Increased dyspnea. Treated for AECOPD with z pack and prolonged steroid taper. Concern for asthma overlap given recurrent exacerbations and elevated eosinophils 03/2021 of 700. Recommend checking CBC with diff and IgE once off steroids. Will need CT chest follow up for parenchymal band.  07/26/2022: OV with Delyla Sandeen NP for 2 month follow up. He was seen in the ED recently (9/1) for increased dyspnea. Echo prior to admission showed EF of 35%.  Because of this, he was advised to go to the ED for further evaluation of CHF. His CXR was without evidence of volume overload and BNP was normal. He was treated for AECOPD with prednisone burst.  He reports that he had some sort of viral illness towards the end of August. He developed a productive cough with gray sputum and chest congestion. He then started having trouble with his breathing, which he started taking prednisone 10 mg for that he had at home. He had gone for an echocardiogram on 8/31 and then his PCP on 9/1 for follow up. They recommended he go to the ER due to his symptoms and low EF.   Today, he reports feeling significantly better. Feels like the prednisone is helping; he's on day 4 of 40 mg. Breathing feels back to baseline and he hasn't had to use his oxygen during the day. His cough has mostly resolved; occasional AM cough with clear sputum, which is normal for him. He denies any wheezing, hemoptysis, weight loss, anorexia, orthopnea, PND, leg swelling, palpitations. He continues on Trelegy daily.   08/18/2022: OV with Tatyana Biber NP for follow up after being treated for COPD exacerbation and referred to cardiology for evaluated of systolic HF. He was seen by Dr. [name] yesterday. He was started on Jardiance, losartan, metoprolol and spironolactone. His amlodipine was discontinued. He is more short of breath today. He has been wearing his oxygen more; not checking his O2 but feels it helps with his breathing. He felt better when he was on the prednisone and then noticed a decline in his breathing after completing it. Chest feels a little tight and he has an occasional wheeze. He denies any  significant cough, orthopnea, leg swelling, hemoptysis, fevers. He is currently on Trelegy 100. Uses albuterol daily. AECOPD. He has been exacerbating recently. He is very steroid responsive; flares once he comes off prednisone. Concern for possible asthma overlap given history of significantly elevated  eosinophils; most recently 1000 08/17/2022. Step up Trelegy to 200. We will treat him with prednisone taper and transition him to 10 mg daily for the next 4-6 weeks to see how he responds. Discussed case with Dr. [name]; if he flares despite daily prednisone, may consider biologic therapy.  09/01/2022: OV with Kynslei Art NP for follow up. He feels significantly better than when he was here last. He actually tells me that he's the best he's been in years. Feels like he's been able to do and isn't getting winded, included mowing the grass/working outside. He's very pleased with his progress. He denies any chest congestion, cough, wheezing. He has not had any trouble with leg swelling or orthopnea. He is using Trelegy 200 daily. Hasn't had to use his albuterol. He's on cetirizine and flonase for allergies. He's taking prednisone 10 mg daily.   12/26/2022: Today - follow up Patient presents today for follow up. He did have an exacerbation in early December, treated with azithromycin and prednisone taper. Recovered well from this. He feels like other than this, he has been doing very well. His breathing is much better on the prednisone; still on 10 mg daily. Never tried to decrease to 5 mg. He denies any significant cough or chest congestion. He also started singulair after our last visit; feels like this has helped with his nasal symptoms. He is using his Trelegy daily. Hasn't had to use his oxygen recently. He does not wear it consistently at night; curious if he still needs it.   Allergies  Allergen Reactions   Crestor [Rosuvastatin Calcium] Shortness Of Breath   Zestril [Lisinopril] Swelling   Spiriva Respimat [Tiotropium Bromide Monohydrate] Other (See Comments)    Red, glassy eyes   Tiotropium Other (See Comments)    Red glassy eyes     Immunization History  Administered Date(s) Administered   Fluad Quad(high Dose 65+) 08/07/2020, 09/14/2021, 09/01/2022   H1N1 12/02/2008   Influenza Whole 09/06/2007,  12/02/2008   Influenza, Seasonal, Injecte, Preservative Fre 08/01/2014   Influenza,inj,Quad PF,6+ Mos 09/04/2015, 09/01/2018, 10/09/2019   Influenza-Unspecified 08/21/2013   PFIZER(Purple Top)SARS-COV-2 Vaccination 03/05/2020, 03/30/2020, 09/30/2020   Pfizer Covid-19 Vaccine Bivalent Booster 56yr & up 10/18/2021   Pneumococcal Conjugate-13 10/23/2015   Pneumococcal Polysaccharide-23 08/21/2004, 08/28/2020   Td 01/19/2006   Zoster, Live 12/04/2015    Past Medical History:  Diagnosis Date   Asthma    COPD (chronic obstructive pulmonary disease) (HCC)    Hypertension    Shortness of breath     Tobacco History: Social History   Tobacco Use  Smoking Status Former   Packs/day: 0.50   Years: 30.00   Total pack years: 15.00   Types: Cigarettes   Quit date: 08/25/2020   Years since quitting: 2.3  Smokeless Tobacco Never   Counseling given: Not Answered   Outpatient Medications Prior to Visit  Medication Sig Dispense Refill   acetaminophen (TYLENOL) 500 MG tablet Take 1,000 mg by mouth every 6 (six) hours as needed for mild pain or moderate pain.     albuterol (VENTOLIN HFA) 108 (90 Base) MCG/ACT inhaler INHALE 2 PUFFS EVERY 6 HOURS AS NEEDED FOR WHEEZING OR SHORTNESS OF BREATH. 8.5 each 1   azithromycin (ZITHROMAX) 250 MG tablet Take 1  tablet (250 mg total) by mouth as directed. 6 tablet 0   cetirizine (ZYRTEC) 10 MG tablet Take 1 tablet (10 mg total) by mouth daily. 30 tablet 11   empagliflozin (JARDIANCE) 10 MG TABS tablet Take 1 tablet (10 mg total) by mouth daily before breakfast. 90 tablet 3   fluticasone (FLONASE) 50 MCG/ACT nasal spray Place 2 sprays into both nostrils daily. 1 spray in each nostril every day 16 g 12   Fluticasone-Umeclidin-Vilant (TRELEGY ELLIPTA) 100-62.5-25 MCG/ACT AEPB TAKE 1 PUFF BY MOUTH EVERY DAY 60 each 5   Fluticasone-Umeclidin-Vilant (TRELEGY ELLIPTA) 200-62.5-25 MCG/ACT AEPB Inhale 1 puff into the lungs daily. 60 each 3   ipratropium-albuterol  (DUONEB) 0.5-2.5 (3) MG/3ML SOLN INHALE 1 VIAL VIA NEBULIZER EVERY 6 HOURS AS NEEDED FOR SHORTNESS OF BREATH /  WHEEZING 270 mL 11   losartan-hydrochlorothiazide (HYZAAR) 50-12.5 MG tablet TAKE 1 TABLET BY MOUTH EVERY DAY 90 tablet 1   metoprolol succinate (TOPROL XL) 25 MG 24 hr tablet Take 0.5 tablets (12.5 mg total) by mouth daily. 90 tablet 3   montelukast (SINGULAIR) 10 MG tablet Take 1 tablet (10 mg total) by mouth at bedtime. 30 tablet 5   predniSONE (DELTASONE) 10 MG tablet Take 1 tablet (10 mg total) by mouth daily with breakfast. 30 tablet 2   spironolactone (ALDACTONE) 25 MG tablet Take 0.5 tablets (12.5 mg total) by mouth daily. 45 tablet 3   predniSONE (DELTASONE) 10 MG tablet 4 x 4 days, 3 x 4 days, 2 x 4 days, 1 x 4 days then stop 40 tablet 0   No facility-administered medications prior to visit.     Review of Systems:   Constitutional: No weight loss or gain, night sweats, fevers, chills, fatigue, or lassitude. HEENT: No headaches, difficulty swallowing, tooth/dental problems, or sore throat.  No sneezing. No itching, ear ache, nasal congestion, or post nasal drip CV:  No chest pain, orthopnea, PND, swelling in lower extremities, anasarca, dizziness, palpitations, syncope Resp: No shortness of breath with exertion; chest tightness. No wheeze. No cough. No excess mucus or change in color of mucus. No hemoptysis.  No chest wall deformity GI:  No heartburn, indigestion, abdominal pain, nausea, vomiting, diarrhea, change in bowel habits, loss of appetite, bloody stools.  MSK:  No joint pain or swelling.  No decreased range of motion.  No back pain. Neuro: No dizziness or lightheadedness.  Psych: No depression or anxiety. Mood stable.     Physical Exam:  BP 130/86   Pulse 82   Ht '5\' 9"'$  (1.753 m)   Wt 160 lb (72.6 kg)   SpO2 99%   BMI 23.63 kg/m   GEN: Pleasant, interactive, well-appearing; in no acute distress HEENT:  Normocephalic and atraumatic. PERRLA. Sclera white.  Nasal turbinates pink, moist and patent bilaterally. No rhinorrhea present. Oropharynx pink and moist, without exudate or edema. No lesions, ulcerations, or postnasal drip.  NECK:  Supple w/ fair ROM. No JVD present. No lymphadenopathy.   CV: RRR, no m/r/g, no peripheral edema. Pulses intact, +2 bilaterally. No cyanosis, pallor or clubbing. PULMONARY:  Unlabored, regular breathing. Clear bilaterally A&P w/o wheezes/rales/rhonchi. No accessory muscle use. No dullness to percussion. GI: BS present and normoactive. Soft, non-tender to palpation. No organomegaly or masses detected  MSK: No erythema, warmth or tenderness.  Neuro: A/Ox3. No focal deficits noted.   Skin: Warm, no lesions or rashe Psych: Normal affect and behavior. Judgement and thought content appropriate.     Lab Results:  CBC  Component Value Date/Time   WBC 6.4 08/17/2022 0924   WBC 7.1 07/22/2022 1034   RBC 4.29 08/17/2022 0924   RBC 4.54 07/22/2022 1034   HGB 14.1 08/17/2022 0924   HCT 42.0 08/17/2022 0924   PLT 285 08/17/2022 0924   MCV 98 (H) 08/17/2022 0924   MCH 32.9 08/17/2022 0924   MCH 32.6 07/22/2022 1034   MCHC 33.6 08/17/2022 0924   MCHC 33.9 07/22/2022 1034   RDW 12.3 08/17/2022 0924   LYMPHSABS 2.8 08/17/2022 0924   MONOABS 1.7 (H) 03/27/2021 0250   EOSABS 1.0 (H) 08/17/2022 0924   BASOSABS 0.0 08/17/2022 0924    BMET    Component Value Date/Time   NA 142 08/17/2022 0924   K 4.4 08/17/2022 0924   CL 105 08/17/2022 0924   CO2 22 08/17/2022 0924   GLUCOSE 66 (L) 08/17/2022 0924   GLUCOSE 71 07/22/2022 1034   BUN 13 08/17/2022 0924   CREATININE 1.40 (H) 08/17/2022 0924   CALCIUM 9.6 08/17/2022 0924   GFRNONAA >60 07/22/2022 1034   GFRAA 64 11/10/2020 1552    BNP    Component Value Date/Time   BNP 22.7 07/22/2022 1033     Imaging:  CT CHEST LUNG CA SCREEN LOW DOSE W/O CM  Result Date: 12/08/2022 CLINICAL DATA:  67 year old male former smoker with 22 pack-year smoking history.  EXAM: CT CHEST WITHOUT CONTRAST LOW-DOSE FOR LUNG CANCER SCREENING TECHNIQUE: Multidetector CT imaging of the chest was performed following the standard protocol without IV contrast. RADIATION DOSE REDUCTION: This exam was performed according to the departmental dose-optimization program which includes automated exposure control, adjustment of the mA and/or kV according to patient size and/or use of iterative reconstruction technique. COMPARISON:  06/02/2022 chest CT. FINDINGS: Cardiovascular: Normal heart size. No significant pericardial effusion/thickening. Atherosclerotic nonaneurysmal thoracic aorta. Normal caliber pulmonary arteries. Mediastinum/Nodes: No significant thyroid nodules. Unremarkable esophagus. No pathologically enlarged axillary, mediastinal or hilar lymph nodes, noting limited sensitivity for the detection of hilar adenopathy on this noncontrast study. Lungs/Pleura: No pneumothorax. No pleural effusion. Moderate centrilobular emphysema with diffuse bronchial wall thickening. Stable curvilinear mildly thickened parenchymal band in the medial right middle lobe compatible with postinfectious/postinflammatory scarring. Mildly thickened small curvilinear parenchymal band in the lingula is increased in thickness, remaining compatible with nonspecific scarring versus atelectasis. Additional smaller thin parenchymal bands scattered in the dependent lung bases bilaterally compatible with mild scarring or atelectasis. No acute consolidative airspace disease or lung masses. No significant pulmonary nodules. Previously described small subpleural triangular opacity in the medial apical right upper lobe is unchanged (series 3/image 44) and compatible with nonspecific scarring. Upper abdomen: Scattered simple liver cysts, largest 2.7 cm at the right liver dome. Musculoskeletal:  No aggressive appearing focal osseous lesions. IMPRESSION: 1. Lung-RADS 1, negative. Continue annual screening with low-dose chest CT  without contrast in 12 months. 2. Aortic Atherosclerosis (ICD10-I70.0) and Emphysema (ICD10-J43.9). Electronically Signed   By: Ilona Sorrel M.D.   On: 12/08/2022 09:58         No data to display          No results found for: "NITRICOXIDE"      Assessment & Plan:   COPD (chronic obstructive pulmonary disease) (HCC) Moderate COPD with potential asthma overlap given given history of significantly elevated eosinophils; 1000 08/17/2022. He appears compensated on current regimen of high dose Trelegy, daily low dose steroid, and singulair. We will again try to decrease his prednisone to lowest effective dose. If he has future exacerbations, would  revisit biologic therapy.   Patient Instructions  Continue Trelegy to 200 mcg dose, 1 puff daily. Brush tongue and rinse mouth afterwards  Continue Albuterol inhaler 2 puffs or 3 mL neb every 6 hours as needed for shortness of breath or wheezing. Notify if symptoms persist despite rescue inhaler/neb use. Continue flonase nasal spray 2 sprays each nostril daily for allergies/nasal congestion Continue cetirizine 1 tab daily for allergies Continue supplemental O2 2 lpm at night and as needed during the day to maintain oxygen >88-90% Decrease daily prednisone to 5 mg. Take in AM with food. If you start having trouble with your breathing, increase back to 10 mg daily. If no improvement, call us for an appointment.  Continue Singulair (montelukast) 1 tab At bedtime for allergies   Next CT chest January 2025   Follow up in 3-4 months with Dr. [name] or Alanson Aly. If symptoms worsen, please contact office for sooner follow up or seek emergency care   Chronic respiratory failure with hypoxia ([place]) Stable without recent O2 use. Inconsistent use of nocturnal therapy. He would like to re-evaluate to see if he still needs this. ONO on room air ordered today. Goal >88-90%  History of tobacco use Areas of scarring, which are stable. He will continue  with low dose CT for lung cancer screening. Next CT 11/2023.   I spent 32 minutes of dedicated to the care of this patient on the date of this encounter to include pre-visit review of records, face-to-face time with the patient discussing conditions above, post visit ordering of testing, clinical documentation with the electronic health record, making appropriate referrals as documented, and communicating necessary findings to members of the patients care team.  Clayton Bibles, NP 12/26/2022  Pt aware and understands NP's role.

## 2022-12-26 NOTE — Assessment & Plan Note (Signed)
Moderate COPD with potential asthma overlap given given history of significantly elevated eosinophils; 1000 08/17/2022. He appears compensated on current regimen of high dose Trelegy, daily low dose steroid, and singulair. We will again try to decrease his prednisone to lowest effective dose. If he has future exacerbations, would revisit biologic therapy.   Patient Instructions  Continue Trelegy to 200 mcg dose, 1 puff daily. Brush tongue and rinse mouth afterwards  Continue Albuterol inhaler 2 puffs or 3 mL neb every 6 hours as needed for shortness of breath or wheezing. Notify if symptoms persist despite rescue inhaler/neb use. Continue flonase nasal spray 2 sprays each nostril daily for allergies/nasal congestion Continue cetirizine 1 tab daily for allergies Continue supplemental O2 2 lpm at night and as needed during the day to maintain oxygen >88-90% Decrease daily prednisone to 5 mg. Take in AM with food. If you start having trouble with your breathing, increase back to 10 mg daily. If no improvement, call us for an appointment.  Continue Singulair (montelukast) 1 tab At bedtime for allergies   Next CT chest January 2025   Follow up in 3-4 months with Dr. [name] or Alanson Aly. If symptoms worsen, please contact office for sooner follow up or seek emergency care

## 2022-12-26 NOTE — Assessment & Plan Note (Signed)
Areas of scarring, which are stable. He will continue with low dose CT for lung cancer screening. Next CT 11/2023.

## 2022-12-30 ENCOUNTER — Other Ambulatory Visit: Payer: Self-pay | Admitting: Pulmonary Disease

## 2022-12-30 ENCOUNTER — Other Ambulatory Visit: Payer: Self-pay | Admitting: Nurse Practitioner

## 2022-12-30 DIAGNOSIS — J441 Chronic obstructive pulmonary disease with (acute) exacerbation: Secondary | ICD-10-CM

## 2022-12-30 DIAGNOSIS — J45901 Unspecified asthma with (acute) exacerbation: Secondary | ICD-10-CM

## 2023-01-03 ENCOUNTER — Ambulatory Visit: Payer: Medicare PPO | Admitting: Student

## 2023-01-05 ENCOUNTER — Telehealth (HOSPITAL_COMMUNITY): Payer: Self-pay | Admitting: Emergency Medicine

## 2023-01-05 NOTE — Telephone Encounter (Signed)
Reaching out to patient to offer assistance regarding upcoming cardiac imaging study; pt verbalizes understanding of appt date/time, parking situation and where to check in, pre-test NPO status and medications ordered, and verified current allergies; name and call back number provided for further questions should they arise [name] RN Navigator Cardiac Imaging [facility] [phone] office [phone] cell   Arrival 1130 WC Entrance  Holding diuretics Denies metal Denies claustro Denies iv issues

## 2023-01-06 ENCOUNTER — Other Ambulatory Visit: Payer: Self-pay | Admitting: Internal Medicine

## 2023-01-06 ENCOUNTER — Ambulatory Visit (HOSPITAL_COMMUNITY): Admission: RE | Admit: 2023-01-06 | Payer: Medicare PPO | Source: Ambulatory Visit

## 2023-01-06 ENCOUNTER — Ambulatory Visit (HOSPITAL_COMMUNITY)
Admission: RE | Admit: 2023-01-06 | Discharge: 2023-01-06 | Disposition: A | Discharge status: Home / Self Care | Payer: Medicare PPO | Source: Ambulatory Visit | Attending: Internal Medicine | Admitting: Internal Medicine

## 2023-01-06 DIAGNOSIS — I509 Heart failure, unspecified: Secondary | ICD-10-CM

## 2023-01-06 MED ORDER — GADOBUTROL 1 MMOL/ML IV SOLN
10.0000 mL | Freq: Once | INTRAVENOUS | Status: AC | PRN
  Administered 2023-01-06: 10 mL via INTRAVENOUS

## 2023-01-08 NOTE — Patient Instructions (Signed)
Health Maintenance, Male Adopting a healthy lifestyle and getting preventive care are important in promoting health and wellness. Ask your health care provider about: The right schedule for you to have regular tests and exams. Things you can do on your own to prevent diseases and keep yourself healthy. What should I know about diet, weight, and exercise? Eat a healthy diet  Eat a diet that includes plenty of vegetables, fruits, low-fat dairy products, and lean protein. Do not eat a lot of foods that are high in solid fats, added sugars, or sodium. Maintain a healthy weight Body mass index (BMI) is a measurement that can be used to identify possible weight problems. It estimates body fat based on height and weight. Your health care provider can help determine your BMI and help you achieve or maintain a healthy weight. Get regular exercise Get regular exercise. This is one of the most important things you can do for your health. Most adults should: Exercise for at least 150 minutes each week. The exercise should increase your heart rate and make you sweat (moderate-intensity exercise). Do strengthening exercises at least twice a week. This is in addition to the moderate-intensity exercise. Spend less time sitting. Even light physical activity can be beneficial. Watch cholesterol and blood lipids Have your blood tested for lipids and cholesterol at 69 years of age, then have this test every 5 years. You may need to have your cholesterol levels checked more often if: Your lipid or cholesterol levels are high. You are older than 69 years of age. You are at high risk for heart disease. What should I know about cancer screening? Many types of cancers can be detected early and may often be prevented. Depending on your health history and family history, you may need to have cancer screening at various ages. This may include screening for: Colorectal cancer. Prostate cancer. Skin cancer. Lung  cancer. What should I know about heart disease, diabetes, and high blood pressure? Blood pressure and heart disease High blood pressure causes heart disease and increases the risk of stroke. This is more likely to develop in people who have high blood pressure readings or are overweight. Talk with your health care provider about your target blood pressure readings. Have your blood pressure checked: Every 3-5 years if you are 18-39 years of age. Every year if you are 40 years old or older. If you are between the ages of 65 and 75 and are a current or former smoker, ask your health care provider if you should have a one-time screening for abdominal aortic aneurysm (AAA). Diabetes Have regular diabetes screenings. This checks your fasting blood sugar level. Have the screening done: Once every three years after age 45 if you are at a normal weight and have a low risk for diabetes. More often and at a younger age if you are overweight or have a high risk for diabetes. What should I know about preventing infection? Hepatitis B If you have a higher risk for hepatitis B, you should be screened for this virus. Talk with your health care provider to find out if you are at risk for hepatitis B infection. Hepatitis C Blood testing is recommended for: Everyone born from 1945 through 1965. Anyone with known risk factors for hepatitis C. Sexually transmitted infections (STIs) You should be screened each year for STIs, including gonorrhea and chlamydia, if: You are sexually active and are younger than 69 years of age. You are older than 69 years of age and your   health care provider tells you that you are at risk for this type of infection. Your sexual activity has changed since you were last screened, and you are at increased risk for chlamydia or gonorrhea. Ask your health care provider if you are at risk. Ask your health care provider about whether you are at high risk for HIV. Your health care provider  may recommend a prescription medicine to help prevent HIV infection. If you choose to take medicine to prevent HIV, you should first get tested for HIV. You should then be tested every 3 months for as long as you are taking the medicine. Follow these instructions at home: Alcohol use Do not drink alcohol if your health care provider tells you not to drink. If you drink alcohol: Limit how much you have to 0-2 drinks a day. Know how much alcohol is in your drink. In the [place] one drink equals one 12 oz bottle of beer (355 mL), one 5 oz glass of wine (148 mL), or one 1 oz glass of hard liquor (44 mL). Lifestyle Do not use any products that contain nicotine or tobacco. These products include cigarettes, chewing tobacco, and vaping devices, such as e-cigarettes. If you need help quitting, ask your health care provider. Do not use street drugs. Do not share needles. Ask your health care provider for help if you need support or information about quitting drugs. General instructions Schedule regular health, dental, and eye exams. Stay current with your vaccines. Tell your health care provider if: You often feel depressed. You have ever been abused or do not feel safe at home. Summary Adopting a healthy lifestyle and getting preventive care are important in promoting health and wellness. Follow your health care provider's instructions about healthy diet, exercising, and getting tested or screened for diseases. Follow your health care provider's instructions on monitoring your cholesterol and blood pressure. This information is not intended to replace advice given to you by your health care provider. Make sure you discuss any questions you have with your health care provider. Document Revised: 03/29/2021 Document Reviewed: 03/29/2021 Elsevier Patient Education  2023 [employer]  

## 2023-01-08 NOTE — Progress Notes (Unsigned)
I connected with  [name] on 01/09/23 by a audio enabled telemedicine application and verified that I am speaking with the correct person using two identifiers.  Patient Location: Home  Provider Location: Home Office  I discussed the limitations of evaluation and management by telemedicine. The patient expressed understanding and agreed to proceed.  Subjective:   [name] is a 67 y.o. male who presents for an Initial Medicare Annual Wellness Visit.  Review of Systems    Per HPI unless specifically indicated below.  Cardiac Risk Factors include: advanced age (>73mn, >>34women);male gender, Primary Hypertension, CHF, and Hyperlipidemia.          Objective:       12/26/2022    8:51 AM 11/08/2022    3:56 PM 09/01/2022    9:13 AM  Vitals with BMI  Height 5' 9"$   5' 9"$   Weight 160 lbs 159 lbs 6 oz 159 lbs 10 oz  BMI [id number] Systolic [id number] Diastolic 86 [id number]  Pulse 82 90 82    There were no vitals filed for this visit. There is no height or weight on file to calculate BMI.     11/08/2022    3:57 PM 07/22/2022    4:37 PM 07/22/2022   10:22 AM 07/22/2022    8:53 AM 05/20/2022    9:07 AM 04/14/2022    3:28 PM 03/31/2022    2:38 PM  Advanced Directives  Does Patient Have a Medical Advance Directive? No No No No No No No  Would patient like information on creating a medical advance directive? No - Patient declined   No - Patient declined No - Patient declined No - Patient declined No - Patient declined    Current Medications (verified) Outpatient Encounter Medications as of 01/09/2023  Medication Sig   acetaminophen (TYLENOL) 500 MG tablet Take 1,000 mg by mouth every 6 (six) hours as needed for mild pain or moderate pain.   albuterol (VENTOLIN HFA) 108 (90 Base) MCG/ACT inhaler INHALE 2 PUFFS EVERY 6 HOURS AS NEEDED FOR WHEEZING OR SHORTNESS OF BREATH.   cetirizine (ZYRTEC) 10 MG tablet Take 1 tablet (10 mg total) by mouth daily.   empagliflozin  (JARDIANCE) 10 MG TABS tablet Take 1 tablet (10 mg total) by mouth daily before breakfast.   fluticasone (FLONASE) 50 MCG/ACT nasal spray Place 2 sprays into both nostrils daily. 1 spray in each nostril every day   Fluticasone-Umeclidin-Vilant (TRELEGY ELLIPTA) 200-62.5-25 MCG/ACT AEPB Inhale 1 puff into the lungs daily.   ipratropium-albuterol (DUONEB) 0.5-2.5 (3) MG/3ML SOLN INHALE 1 VIAL VIA NEBULIZER EVERY 6 HOURS AS NEEDED FOR SHORTNESS OF BREATH /  WHEEZING   losartan-hydrochlorothiazide (HYZAAR) 50-12.5 MG tablet TAKE 1 TABLET BY MOUTH EVERY DAY   metoprolol succinate (TOPROL XL) 25 MG 24 hr tablet Take 0.5 tablets (12.5 mg total) by mouth daily.   montelukast (SINGULAIR) 10 MG tablet Take 1 tablet (10 mg total) by mouth at bedtime.   predniSONE (DELTASONE) 10 MG tablet TAKE 1 TABLET (10 MG TOTAL) BY MOUTH DAILY WITH BREAKFAST.   spironolactone (ALDACTONE) 25 MG tablet Take 0.5 tablets (12.5 mg total) by mouth daily.   [DISCONTINUED] azithromycin (ZITHROMAX) 250 MG tablet Take 1 tablet (250 mg total) by mouth as directed. (Patient not taking: Reported on 01/09/2023)   [DISCONTINUED] Fluticasone-Umeclidin-Vilant (TRELEGY ELLIPTA) 100-62.5-25 MCG/ACT AEPB TAKE 1 PUFF BY MOUTH EVERY DAY (Patient not taking: Reported on 01/09/2023)   No facility-administered encounter medications  on file as of 01/09/2023.    Allergies (verified) Crestor [rosuvastatin calcium], Zestril [lisinopril], Spiriva respimat [tiotropium bromide monohydrate], and Tiotropium   History: Past Medical History:  Diagnosis Date   Asthma    COPD (chronic obstructive pulmonary disease) (HCC)    Hypertension    Shortness of breath    Past Surgical History:  Procedure Laterality Date   COLONOSCOPY     Family History  Problem Relation Age of Onset   Cancer Father    Social History   Socioeconomic History   Marital status: Married    Spouse name: Chibueze Galante   Number of children: 1   Years of education: Not on file    Highest education level: Not on file  Occupational History   Occupation: Retired  Tobacco Use   Smoking status: Former    Packs/day: 0.50    Years: 30.00    Total pack years: 15.00    Types: Cigarettes    Quit date: 08/25/2020    Years since quitting: 2.3   Smokeless tobacco: Never  Vaping Use   Vaping Use: Never used  Substance and Sexual Activity   Alcohol use: Yes    Comment: rare/occasional   Drug use: Not Currently   Sexual activity: Yes    Partners: Female  Other Topics Concern   Not on file  Social History Narrative   Not on file   Social Determinants of Health   Financial Resource Strain: Low Risk  (01/09/2023)   Overall Financial Resource Strain (CARDIA)    Difficulty of Paying Living Expenses: Not hard at all  Food Insecurity: No Food Insecurity (01/09/2023)   Hunger Vital Sign    Worried About Running Out of Food in the Last Year: Never true    Ran Out of Food in the Last Year: Never true  Transportation Needs: No Transportation Needs (01/09/2023)   PRAPARE - Hydrologist (Medical): No    Lack of Transportation (Non-Medical): No  Physical Activity: Sufficiently Active (01/09/2023)   Exercise Vital Sign    Days of Exercise per Week: 7 days    Minutes of Exercise per Session: 60 min  Stress: No Stress Concern Present (01/09/2023)   [place]    Feeling of Stress : Not at all  Social Connections: Moderately Integrated (01/09/2023)   Social Connection and Isolation Panel [NHANES]    Frequency of Communication with Friends and Family: More than three times a week    Frequency of Social Gatherings with Friends and Family: Twice a week    Attends Religious Services: More than 4 times per year    Active Member of [employer] or Organizations: No    Attends Music therapist: Never    Marital Status: Married    Tobacco Counseling Counseling given: No   Clinical  Intake:  Pre-visit preparation completed: No  Pain : No/denies pain     Nutritional Status: BMI of 19-24  Normal Nutritional Risks: None, Unintentional weight gain Diabetes: No  How often do you need to have someone help you when you read instructions, pamphlets, or other written materials from your doctor or pharmacy?: 1 - Never  Diabetic?No  Interpreter Needed?: No  Information entered by :: Donnie Mesa, CMA   Activities of Daily Living    01/09/2023    8:38 AM  In your present state of health, do you have any difficulty performing the following activities:  Hearing? 0  Vision? 0  Difficulty concentrating or making decisions? 0  Walking or climbing stairs? 0  Dressing or bathing? 0  Doing errands, shopping? 0    Patient Care Team: [name], MD as PCP - General (Family Medicine) Janina Mayo, MD as PCP - Cardiology (Cardiology)  Indicate any recent Medical Services you may have received from other than [facility] providers in the past year (date may be approximate).     Assessment:   This is a routine wellness examination for Shivesh.  Hearing/Vision screen Denies any hearing issues. Denies any vision changes. Overdue for an Annual Eye Exam. The patient was informed of the importance of having an Annual Eye Exam.  Dietary issues and exercise activities discussed: Current Exercise Habits: Structured exercise class, Type of exercise: walking, Time (Minutes): 60, Frequency (Times/Week): 7, Weekly Exercise (Minutes/Week): 420, Intensity: Moderate, Exercise limited by: None identified   Goals Addressed   None    Depression Screen    01/09/2023    8:37 AM 11/08/2022    3:57 PM 07/22/2022    8:54 AM 05/20/2022    9:08 AM 04/14/2022    3:27 PM 03/31/2022    2:37 PM 02/15/2022   11:29 AM  PHQ 2/9 Scores  PHQ - 2 Score 0 0 0 0 0 0 0  PHQ- 9 Score  0 0 0 0 0 0    Fall Risk    01/09/2023    8:38 AM 11/08/2022    3:57 PM 07/22/2022    8:53 AM 05/20/2022     9:08 AM 04/14/2022    3:27 PM  Fall Risk   Falls in the past year? 0 0 0 0 0  Number falls in past yr: 0 0 0 0 0  Injury with Fall? 0 0 0 0 0  Risk for fall due to : No Fall Risks        FALL RISK PREVENTION PERTAINING TO THE HOME:  Any stairs in or around the home? Yes  If so, are there any without handrails? No  Home free of loose throw rugs in walkways, pet beds, electrical cords, etc? Yes  Adequate lighting in your home to reduce risk of falls? Yes   ASSISTIVE DEVICES UTILIZED TO PREVENT FALLS:  Life alert? No  Use of a cane, walker or w/c? No  Grab bars in the bathroom? No  Shower chair or bench in shower? No  Elevated toilet seat or a handicapped toilet? Yes  TIMED UP AND GO:  Was the test performed? Unable to perform, virtual appointment   Cognitive Function:        01/09/2023    8:38 AM  6CIT Screen  What Year? 0 points  What month? 0 points  What time? 0 points  Count back from 20 0 points  Months in reverse 4 points  Repeat phrase 0 points  Total Score 4 points    Immunizations Immunization History  Administered Date(s) Administered   Fluad Quad(high Dose 65+) 08/07/2020, 09/14/2021, 09/01/2022   H1N1 12/02/2008   Influenza Whole 09/06/2007, 12/02/2008   Influenza, Seasonal, Injecte, Preservative Fre 08/01/2014   Influenza,inj,Quad PF,6+ Mos 09/04/2015, 09/01/2018, 10/09/2019   Influenza-Unspecified 08/21/2013   PFIZER(Purple Top)SARS-COV-2 Vaccination 03/05/2020, 03/30/2020, 09/30/2020   Pfizer Covid-19 Vaccine Bivalent Booster 47yr & up 10/18/2021   Pneumococcal Conjugate-13 10/23/2015   Pneumococcal Polysaccharide-23 08/21/2004, 08/28/2020   Td 01/19/2006   Zoster, Live 12/04/2015    TDAP status: Due, Education has been provided regarding the importance of this vaccine. Advised  may receive this vaccine at local pharmacy or Health Dept. Aware to provide a copy of the vaccination record if obtained from local pharmacy or Health Dept. Verbalized  acceptance and understanding.  Flu Vaccine status: Up to date  Pneumococcal vaccine status: Up to date  Covid-19 vaccine status: Information provided on how to obtain vaccines.   Qualifies for Shingles Vaccine? Yes   Zostavax completed No   Shingrix Completed?: No.    Education has been provided regarding the importance of this vaccine. Patient has been advised to call insurance company to determine out of pocket expense if they have not yet received this vaccine. Advised may also receive vaccine at local pharmacy or Health Dept. Verbalized acceptance and understanding.  Screening Tests Health Maintenance  Topic Date Due   Zoster Vaccines- Shingrix (1 of 2) Never done   DTaP/Tdap/Td (2 - Tdap) 01/20/2016   COLONOSCOPY (Pts 45-26yr Insurance coverage will need to be confirmed)  06/15/2022   COVID-19 Vaccine (5 - 2023-24 season) 07/22/2022   Medicare Annual Wellness (AWV)  01/10/2024   Pneumonia Vaccine 67 Years old  Completed   INFLUENZA VACCINE  Completed   Hepatitis C Screening  Completed   HPV VACCINES  Aged Out    Health Maintenance  Health Maintenance Due  Topic Date Due   Zoster Vaccines- Shingrix (1 of 2) Never done   DTaP/Tdap/Td (2 - Tdap) 01/20/2016   COLONOSCOPY (Pts 45-474yrInsurance coverage will need to be confirmed)  06/15/2022   COVID-19 Vaccine (5 - 2023-24 season) 07/22/2022    Colorectal cancer screening: Referral to GI placed 11/08/2022. Pt aware the office will call re: appt. Last colonoscopy 12/2016 with adenomatous and hyperplastic polyps. I gave the patient [facility] phone number for him to call and schedule his Colonoscopy Consult.   Lung Cancer Screening: (Low Dose CT Chest recommended if Age 69-80ears, 30 pack-year currently smoking OR have quit w/in 15years.) does not qualify.   Lung Cancer Screening Referral: not applicable   Additional Screening:  Hepatitis C Screening: does qualify; Completed 08/07/2020  Vision Screening: Recommended  annual ophthalmology exams for early detection of glaucoma and other disorders of the eye. Is the patient up to date with their annual eye exam?  No Who is the provider or what is the name of the office in which the patient attends annual eye exams?  If pt is not established with a provider, would they like to be referred to a provider to establish care? No .   Dental Screening: Recommended annual dental exams for proper oral hygiene  Community Resource Referral / Chronic Care Management: CRR required this visit?  No   CCM required this visit?  No      Plan:     I have personally reviewed and noted the following in the patient's chart:   Medical and social history Use of alcohol, tobacco or illicit drugs  Current medications and supplements including opioid prescriptions. Patient is not currently taking opioid prescriptions. Functional ability and status Nutritional status Physical activity Advanced directives List of other physicians Hospitalizations, surgeries, and ER visits in previous 12 months Vitals Screenings to include cognitive, depression, and falls Referrals and appointments  In addition, I have reviewed and discussed with patient certain preventive protocols, quality metrics, and best practice recommendations. A written personalized care plan for preventive services as well as general preventive health recommendations were provided to patient.    Mr. BrDuet Thank you for taking time to come for your Medicare  Wellness Visit. I appreciate your ongoing commitment to your health goals. Please review the following plan we discussed and let me know if I can assist you in the future.   These are the goals we discussed:  Goals   None     This is a list of the screening recommended for you and due dates:  Health Maintenance  Topic Date Due   Zoster (Shingles) Vaccine (1 of 2) Never done   DTaP/Tdap/Td vaccine (2 - Tdap) 01/20/2016   Colon Cancer Screening   06/15/2022   COVID-19 Vaccine (5 - 2023-24 season) 07/22/2022   Medicare Annual Wellness Visit  01/10/2024   Pneumonia Vaccine  Completed   Flu Shot  Completed   Hepatitis C Screening: USPSTF Recommendation to screen - Ages 18-79 yo.  Completed   HPV Vaccine  Aged [address], [place]   01/09/2023   Nurse Notes: Approximately 30 minute Non-Face -To-Face Medicare Wellness Visit        I have reviewed this visit and agree with the documentation.  [place]

## 2023-01-09 ENCOUNTER — Ambulatory Visit (INDEPENDENT_AMBULATORY_CARE_PROVIDER_SITE_OTHER): Payer: Medicare PPO

## 2023-01-09 ENCOUNTER — Telehealth: Payer: Self-pay

## 2023-01-09 ENCOUNTER — Encounter: Payer: Self-pay | Admitting: Gastroenterology

## 2023-01-09 DIAGNOSIS — Z0000 Encounter for general adult medical examination without abnormal findings: Secondary | ICD-10-CM | POA: Diagnosis not present

## 2023-01-09 NOTE — Telephone Encounter (Signed)
This pt was added on for pv on 01/12/23, I see his EF is 35 % and has multiple other health issues, just checking if he should be seen in the office first or direct to hospital? Thanks, Di Kindle

## 2023-01-10 ENCOUNTER — Telehealth: Payer: Self-pay | Admitting: *Deleted

## 2023-01-10 NOTE — Telephone Encounter (Signed)
Pt'swife verified self with pt info. She stated that pt spoke with someone ealier and knows about appt date and time.

## 2023-01-10 NOTE — Telephone Encounter (Signed)
Left message including call back #.

## 2023-01-15 ENCOUNTER — Other Ambulatory Visit: Payer: Self-pay | Admitting: Nurse Practitioner

## 2023-01-15 DIAGNOSIS — J449 Chronic obstructive pulmonary disease, unspecified: Secondary | ICD-10-CM

## 2023-01-20 ENCOUNTER — Ambulatory Visit: Payer: Medicare PPO | Admitting: Student

## 2023-01-20 ENCOUNTER — Telehealth: Payer: Self-pay | Admitting: Internal Medicine

## 2023-01-20 NOTE — Telephone Encounter (Signed)
Pt returning call for MRI results

## 2023-01-20 NOTE — Telephone Encounter (Signed)
The patient has been notified of the result and verbalized understanding.  All questions (if any) were answered. Gershon Crane, LPN [id number] [phone] PM

## 2023-01-20 NOTE — Progress Notes (Deleted)
  SUBJECTIVE:   CHIEF COMPLAINT / HPI:   BP BP Readings from Last 3 Encounters:  12/26/22 130/86  11/08/22 (!) 187/115  09/01/22 (!) 142/88  Meds: Combo pill HCTZ 12.5 mg/Losartan 50 mg, metoprolol XR 25   COPD Last seen by Pulm 12/26/22 - stable and doing well Meds: Trellergy 200 mcg, Prednisone 5 mg daily, Flonase 50 mcg, Zyrtec 10 mg, Singulair 10 m, O2 and albuterol prn   CHF EF 35% Cards last seen 08/18/23 -patient was due for office visit  -Cardiac MRI 01/06/23 LVEF 44% Meds: Metoprolol XR 25 mg, Jardiance 10 mg, Spironolactone 12.5 mg, losartan 25 mg     PERTINENT  PMH / PSH: ***  Past Medical History:  Diagnosis Date   Asthma    COPD (chronic obstructive pulmonary disease) ([place])    Hypertension    Shortness of breath     OBJECTIVE:  There were no vitals taken for this visit. Physical Exam   ASSESSMENT/PLAN:  There are no diagnoses linked to this encounter. No follow-ups on file. [name], MD 01/20/2023, 7:53 AM PGY-***, [place] {    This will disappear when note is signed, click to select method of visit    :1}

## 2023-01-20 NOTE — Patient Instructions (Incomplete)
It was great to see you! Thank you for allowing me to participate in your care!  I recommend that you always bring your medications to each appointment as this makes it easy to ensure we are on the correct medications and helps us not miss when refills are needed.  Our plans for today:  - *** -   We are checking some labs today, I will call you if they are abnormal will send you a MyChart message or a letter if they are normal.  If you do not hear about your labs in the next 2 weeks please let us know.***  Take care and seek immediate care sooner if you develop any concerns.   Dr. Osmel Dykstra, MD [facility]  

## 2023-01-22 ENCOUNTER — Other Ambulatory Visit: Payer: Self-pay | Admitting: Nurse Practitioner

## 2023-01-22 DIAGNOSIS — J3089 Other allergic rhinitis: Secondary | ICD-10-CM

## 2023-01-25 ENCOUNTER — Encounter: Payer: Medicare PPO | Admitting: Gastroenterology

## 2023-03-07 ENCOUNTER — Ambulatory Visit: Payer: Medicare PPO | Admitting: Gastroenterology

## 2023-03-15 ENCOUNTER — Encounter: Payer: Self-pay | Admitting: Nurse Practitioner

## 2023-03-15 ENCOUNTER — Other Ambulatory Visit: Payer: Self-pay | Admitting: Nurse Practitioner

## 2023-03-15 ENCOUNTER — Ambulatory Visit (INDEPENDENT_AMBULATORY_CARE_PROVIDER_SITE_OTHER): Payer: Medicare PPO | Admitting: Nurse Practitioner

## 2023-03-15 VITALS — BP 142/90 | HR 93 | Temp 97.8°F | Ht 69.0 in | Wt 153.0 lb

## 2023-03-15 DIAGNOSIS — I5022 Chronic systolic (congestive) heart failure: Secondary | ICD-10-CM | POA: Diagnosis not present

## 2023-03-15 DIAGNOSIS — J3089 Other allergic rhinitis: Secondary | ICD-10-CM

## 2023-03-15 DIAGNOSIS — J449 Chronic obstructive pulmonary disease, unspecified: Secondary | ICD-10-CM | POA: Diagnosis not present

## 2023-03-15 DIAGNOSIS — J45901 Unspecified asthma with (acute) exacerbation: Secondary | ICD-10-CM

## 2023-03-15 MED ORDER — CETIRIZINE HCL 10 MG PO TABS: 10.0000 mg | ORAL_TABLET | Freq: Every day | ORAL | 11 refills | Status: DC

## 2023-03-15 MED ORDER — PREDNISONE 10 MG PO TABS: 10.0000 mg | ORAL_TABLET | Freq: Every day | ORAL | 5 refills | Status: DC

## 2023-03-15 NOTE — Assessment & Plan Note (Addendum)
Poorly controlled since running out of steroids. Lung exam without bronchospasm today. Plan to restart his low dose daily steroids. Would hold off on taper at this time. He is compliant with triple therapy regimen. Action plan in place. Still needs formal PFT - order last placed in July 2023. Will schedule him for his follow up.   Patient Instructions  Continue Trelegy 200 mcg dose, 1 puff daily. Brush tongue and rinse mouth afterwards  Continue Albuterol inhaler 2 puffs or 3 mL neb every 6 hours as needed for shortness of breath or wheezing. Notify if symptoms persist despite rescue inhaler/neb use. Continue flonase nasal spray 2 sprays each nostril daily for allergies/nasal congestion Continue cetirizine 1 tab daily for allergies Continue supplemental O2 2 lpm at night and as needed during the day to maintain oxygen >88-90% Restart daily prednisone to 10 mg. Take in AM with food.  Continue Singulair (montelukast) 1 tab At bedtime for allergies    Next CT chest January 2025  Complete overnight oxygen study as previously ordered on room air. Wait about a week to do this so we can make sure your breathing is back to normal   Follow up in 3 months with Dr. Vassie Loll (1st) or Katie Delara Shepheard,NP. If symptoms worsen, please contact office for sooner follow up or seek emergency care

## 2023-03-15 NOTE — Patient Instructions (Addendum)
Continue Trelegy 200 mcg dose, 1 puff daily. Brush tongue and rinse mouth afterwards  Continue Albuterol inhaler 2 puffs or 3 mL neb every 6 hours as needed for shortness of breath or wheezing. Notify if symptoms persist despite rescue inhaler/neb use. Continue flonase nasal spray 2 sprays each nostril daily for allergies/nasal congestion Continue cetirizine 1 tab daily for allergies Continue supplemental O2 2 lpm at night and as needed during the day to maintain oxygen >88-90% Restart daily prednisone to 10 mg. Take in AM with food.  Continue Singulair (montelukast) 1 tab At bedtime for allergies    Next CT chest January 2025  Complete overnight oxygen study as previously ordered on room air. Wait about a week to do this so we can make sure your breathing is back to normal   Follow up in 3 months with Dr. Vassie Loll (1st) or Katie Sharan Mcenaney,NP and PFTs. If symptoms worsen, please contact office for sooner follow up or seek emergency care

## 2023-03-15 NOTE — Assessment & Plan Note (Addendum)
Euvolemic on exam. Follow up with cardiology as scheduled 

## 2023-03-15 NOTE — Progress Notes (Signed)
@Patient  ID: [name], male    DOB: 1956-08-23, 67 y.o.   MRN: [id number]  Chief Complaint  Patient presents with   Follow-up    Pt sob, pt is wanting refill on prednisone.     Referring provider: [name], MD  HPI: 67 year old male, former smoker followed for COPD and nocturnal hypoxemia. He is a patient of Dr. Reginia Naas and last seen in office on 12/26/2022 by Crescent Gotham,NP. Past medical history significant for HTN, allergies, HLD, ED.   TEST/EVENTS:  2015 Spirometry: FVC 79%, FEV1 57% 06/02/2022 CT chest: stable RUL apical nodule and new RML curvlinear parenchymal band. Moderate centrilobular emphysema 07/21/2022 echo: EF 35%. LV global hypokinesis. RV function mildly reduced; size normal. Mild MR. 07/22/2022 CXR: hyperexpansion with emphysema. No acute process.  08/17/2022 eos 1000 12/07/2022 LDCT chest: atherosclerosis. No LAD. Moderate centrilobular emphysema with diffuse bronchial wall thickening. Stable curvilinear mildly thickened parenchymal band in the medial RML, consistent with scarring. Mildly thickened small curvilinear parenchymal band in the ligula is increased in thickness, remains compatible with scarring vs atelectasis. Additional smaller thin parenchymal bands scatted in dependent lung bases b/l compatible with mild scarring. Previous small subpleural triangular opacity in the RUL, unchanged and compatible with nonspecific scarring. Lung RADS 1. Scattered simple cysts.   06/03/2022: OV with Dr. Francine Graven for acute visit. Increased dyspnea. Treated for AECOPD with z pack and prolonged steroid taper. Concern for asthma overlap given recurrent exacerbations and elevated eosinophils 03/2021 of 700. Recommend checking CBC with diff and IgE once off steroids. Will need CT chest follow up for parenchymal band.  07/26/2022: OV with Andreea Arca NP for 2 month follow up. He was seen in the ED recently (9/1) for increased dyspnea. Echo prior to admission showed EF of 35%. Because of this, he was  advised to go to the ED for further evaluation of CHF. His CXR was without evidence of volume overload and BNP was normal. He was treated for AECOPD with prednisone burst.  He reports that he had some sort of viral illness towards the end of August. He developed a productive cough with gray sputum and chest congestion. He then started having trouble with his breathing, which he started taking prednisone 10 mg for that he had at home. He had gone for an echocardiogram on 8/31 and then his PCP on 9/1 for follow up. They recommended he go to the ER due to his symptoms and low EF.   Today, he reports feeling significantly better. Feels like the prednisone is helping; he's on day 4 of 40 mg. Breathing feels back to baseline and he hasn't had to use his oxygen during the day. His cough has mostly resolved; occasional AM cough with clear sputum, which is normal for him. He denies any wheezing, hemoptysis, weight loss, anorexia, orthopnea, PND, leg swelling, palpitations. He continues on Trelegy daily.   08/18/2022: OV with Orbin Mayeux NP for follow up after being treated for COPD exacerbation and referred to cardiology for evaluated of systolic HF. He was seen by Dr. Wyline Mood yesterday. He was started on Jardiance, losartan, metoprolol and spironolactone. His amlodipine was discontinued. He is more short of breath today. He has been wearing his oxygen more; not checking his O2 but feels it helps with his breathing. He felt better when he was on the prednisone and then noticed a decline in his breathing after completing it. Chest feels a little tight and he has an occasional wheeze. He denies any significant cough, orthopnea, leg  swelling, hemoptysis, fevers. He is currently on Trelegy 100. Uses albuterol daily. AECOPD. He has been exacerbating recently. He is very steroid responsive; flares once he comes off prednisone. Concern for possible asthma overlap given history of significantly elevated eosinophils; most recently 1000  08/17/2022. Step up Trelegy to 200. We will treat him with prednisone taper and transition him to 10 mg daily for the next 4-6 weeks to see how he responds. Discussed case with Dr. Vassie Loll; if he flares despite daily prednisone, may consider biologic therapy.  09/01/2022: OV with Ladd Cen NP for follow up. He feels significantly better than when he was here last. He actually tells me that he's the best he's been in years. Feels like he's been able to do and isn't getting winded, included mowing the grass/working outside. He's very pleased with his progress. He denies any chest congestion, cough, wheezing. He has not had any trouble with leg swelling or orthopnea. He is using Trelegy 200 daily. Hasn't had to use his albuterol. He's on cetirizine and flonase for allergies. He's taking prednisone 10 mg daily.   12/26/2022: OV with Guillermo Nehring NP for follow up. He did have an exacerbation in early December, treated with azithromycin and prednisone taper. Recovered well from this. He feels like other than this, he has been doing very well. His breathing is much better on the prednisone; still on 10 mg daily. Never tried to decrease to 5 mg. He denies any significant cough or chest congestion. He also started singulair after our last visit; feels like this has helped with his nasal symptoms. He is using his Trelegy daily. Hasn't had to use his oxygen recently. He does not wear it consistently at night; curious if he still needs it.   03/15/2023: Today - follow up Patient presents today for earlier follow up. He had been doing okay but then he ran out of his prednisone about a week ago and has been feeling more short of breath since. He notices it when he goes to try to do activities outside or walk longer distances. He has had some increased wheezing. He's also been sneezing more and has some more nasal drainage. He ran out of his zyrtec as well. Denies any cough or chest congestion, orthopnea, leg swelling, headaches, sore throat,  hoarse voice. He is taking his singulair still. Using Trelegy daily.   Allergies  Allergen Reactions   Crestor [Rosuvastatin Calcium] Shortness Of Breath   Zestril [Lisinopril] Swelling   Spiriva Respimat [Tiotropium Bromide Monohydrate] Other (See Comments)    Red, glassy eyes   Tiotropium Other (See Comments)    Red glassy eyes     Immunization History  Administered Date(s) Administered   Fluad Quad(high Dose 65+) 08/07/2020, 09/14/2021, 09/01/2022   H1N1 12/02/2008   Influenza Whole 09/06/2007, 12/02/2008   Influenza, Seasonal, Injecte, Preservative Fre 08/01/2014   Influenza,inj,Quad PF,6+ Mos 09/04/2015, 09/01/2018, 10/09/2019   Influenza-Unspecified 08/21/2013   PFIZER(Purple Top)SARS-COV-2 Vaccination 03/05/2020, 03/30/2020, 09/30/2020   Pfizer Covid-19 Vaccine Bivalent Booster 29yrs & up 10/18/2021   Pneumococcal Conjugate-13 10/23/2015   Pneumococcal Polysaccharide-23 08/21/2004, 08/28/2020   Td 01/19/2006   Zoster, Live 12/04/2015    Past Medical History:  Diagnosis Date   Asthma    COPD (chronic obstructive pulmonary disease)    Hypertension    Shortness of breath     Tobacco History: Social History   Tobacco Use  Smoking Status Former   Packs/day: 0.50   Years: 30.00   Additional pack years: 0.00   Total  pack years: 15.00   Types: Cigarettes   Quit date: 08/25/2020   Years since quitting: 2.5  Smokeless Tobacco Never   Counseling given: Not Answered   Outpatient Medications Prior to Visit  Medication Sig Dispense Refill   acetaminophen (TYLENOL) 500 MG tablet Take 1,000 mg by mouth every 6 (six) hours as needed for mild pain or moderate pain.     albuterol (VENTOLIN HFA) 108 (90 Base) MCG/ACT inhaler INHALE 2 PUFFS EVERY 6 HOURS AS NEEDED FOR WHEEZING OR SHORTNESS OF BREATH. 8.5 each 1   amLODipine (NORVASC) 10 MG tablet Take 10 mg by mouth daily.     empagliflozin (JARDIANCE) 10 MG TABS tablet Take 1 tablet (10 mg total) by mouth daily before  breakfast. 90 tablet 3   fluticasone (FLONASE) 50 MCG/ACT nasal spray Place 2 sprays into both nostrils daily. 1 spray in each nostril every day 16 g 12   Fluticasone-Umeclidin-Vilant (TRELEGY ELLIPTA) 200-62.5-25 MCG/ACT AEPB Inhale 1 puff into the lungs daily. 60 each 3   ipratropium-albuterol (DUONEB) 0.5-2.5 (3) MG/3ML SOLN INHALE 1 VIAL VIA NEBULIZER EVERY 6 HOURS AS NEEDED FOR SHORTNESS OF BREATH /  WHEEZING 270 mL 11   losartan-hydrochlorothiazide (HYZAAR) 50-12.5 MG tablet TAKE 1 TABLET BY MOUTH EVERY DAY 90 tablet 1   metoprolol succinate (TOPROL XL) 25 MG 24 hr tablet Take 0.5 tablets (12.5 mg total) by mouth daily. 90 tablet 3   montelukast (SINGULAIR) 10 MG tablet TAKE 1 TABLET BY MOUTH EVERYDAY AT BEDTIME 90 tablet 1   rosuvastatin (CRESTOR) 20 MG tablet Take by mouth.     spironolactone (ALDACTONE) 25 MG tablet Take 0.5 tablets (12.5 mg total) by mouth daily. 45 tablet 3   cetirizine (ZYRTEC) 10 MG tablet Take 1 tablet (10 mg total) by mouth daily. 30 tablet 11   predniSONE (DELTASONE) 10 MG tablet TAKE 1 TABLET (10 MG TOTAL) BY MOUTH DAILY WITH BREAKFAST. (Patient not taking: Reported on 03/15/2023) 30 tablet 2   No facility-administered medications prior to visit.     Review of Systems:   Constitutional: No weight loss or gain, night sweats, fevers, chills, fatigue, or lassitude. HEENT: No headaches, difficulty swallowing, tooth/dental problems, or sore throat.  No itching, ear ache +nasal drainage, sneezing CV:  No chest pain, orthopnea, PND, swelling in lower extremities, anasarca, dizziness, palpitations, syncope Resp: +shortness of breath with exertion; wheezing. No cough. No excess mucus or change in color of mucus. No hemoptysis.  No chest wall deformity GI:  No heartburn, indigestion, abdominal pain, nausea, vomiting, diarrhea, change in bowel habits, loss of appetite, bloody stools.  MSK:  No joint pain or swelling.  No decreased range of motion.  No back pain. Neuro:  No dizziness or lightheadedness.  Psych: No depression or anxiety. Mood stable.     Physical Exam:  BP (!) 142/90   Pulse 93   Temp 97.8 F (36.6 C) (Oral)   Ht  (1.753 m)   Wt 153 lb (69.4 kg)   SpO2 99%   BMI 22.59 kg/m   GEN: Pleasant, interactive, well-appearing; in no acute distress HEENT:  Normocephalic and atraumatic. PERRLA. Sclera white. Nasal turbinates pink, moist and patent bilaterally. No rhinorrhea present. Oropharynx pink and moist, without exudate or edema. No lesions, ulcerations, or postnasal drip.  NECK:  Supple w/ fair ROM. No JVD present. No lymphadenopathy.   CV: RRR, no m/r/g, no peripheral edema. Pulses intact, +2 bilaterally. No cyanosis, pallor or clubbing. PULMONARY:  Unlabored, regular breathing. Diminished bilaterally  A&P w/o wheezes/rales/rhonchi. No accessory muscle use. No dullness to percussion. GI: BS present and normoactive. Soft, non-tender to palpation. No organomegaly or masses detected  MSK: No erythema, warmth or tenderness.  Neuro: A/Ox3. No focal deficits noted.   Skin: Warm, no lesions or rashe Psych: Normal affect and behavior. Judgement and thought content appropriate.     Lab Results:  CBC    Component Value Date/Time   WBC 6.4 08/17/2022 0924   WBC 7.1 07/22/2022 1034   RBC 4.29 08/17/2022 0924   RBC 4.54 07/22/2022 1034   HGB 14.1 08/17/2022 0924   HCT 42.0 08/17/2022 0924   PLT 285 08/17/2022 0924   MCV 98 (H) 08/17/2022 0924   MCH 32.9 08/17/2022 0924   MCH 32.6 07/22/2022 1034   MCHC 33.6 08/17/2022 0924   MCHC 33.9 07/22/2022 1034   RDW 12.3 08/17/2022 0924   LYMPHSABS 2.8 08/17/2022 0924   MONOABS 1.7 (H) 03/27/2021 0250   EOSABS 1.0 (H) 08/17/2022 0924   BASOSABS 0.0 08/17/2022 0924    BMET    Component Value Date/Time   NA 142 08/17/2022 0924   K 4.4 08/17/2022 0924   CL 105 08/17/2022 0924   CO2 22 08/17/2022 0924   GLUCOSE 66 (L) 08/17/2022 0924   GLUCOSE 71 07/22/2022 1034   BUN 13 08/17/2022  0924   CREATININE 1.40 (H) 08/17/2022 0924   CALCIUM 9.6 08/17/2022 0924   GFRNONAA >60 07/22/2022 1034   GFRAA 64 11/10/2020 1552    BNP    Component Value Date/Time   BNP 22.7 07/22/2022 1033     Imaging:  No results found.        No data to display          No results found for: "NITRICOXIDE"      Assessment & Plan:   COPD (chronic obstructive pulmonary disease) (HCC) Poorly controlled since running out of steroids. Lung exam without bronchospasm today. Plan to restart his low dose daily steroids. Would hold off on taper at this time. He is compliant with triple therapy regimen. Action plan in place. Still needs formal PFT - order last placed in July 2023. Will schedule him for his follow up.   Patient Instructions  Continue Trelegy 200 mcg dose, 1 puff daily. Brush tongue and rinse mouth afterwards  Continue Albuterol inhaler 2 puffs or 3 mL neb every 6 hours as needed for shortness of breath or wheezing. Notify if symptoms persist despite rescue inhaler/neb use. Continue flonase nasal spray 2 sprays each nostril daily for allergies/nasal congestion Continue cetirizine 1 tab daily for allergies Continue supplemental O2 2 lpm at night and as needed during the day to maintain oxygen >88-90% Restart daily prednisone to 10 mg. Take in AM with food.  Continue Singulair (montelukast) 1 tab At bedtime for allergies    Next CT chest January 2025  Complete overnight oxygen study as previously ordered on room air. Wait about a week to do this so we can make sure your breathing is back to normal   Follow up in 3 months with Dr. Vassie Loll (1st) or Katie Geselle Cardosa,NP. If symptoms worsen, please contact office for sooner follow up or seek emergency care   Allergic rhinitis Restart daily antihistamine. Continue intranasal steroid and singulair   CHF (congestive heart failure) (HCC) Euvolemic on exam. Follow up with cardiology as scheduled.     I spent 32 minutes of dedicated  to the care of this patient on the date of this encounter to  include pre-visit review of records, face-to-face time with the patient discussing conditions above, post visit ordering of testing, clinical documentation with the electronic health record, making appropriate referrals as documented, and communicating necessary findings to members of the patients care team.  Noemi Chapel, NP 03/15/2023  Pt aware and understands NP's role.

## 2023-03-15 NOTE — Assessment & Plan Note (Signed)
Restart daily antihistamine. Continue intranasal steroid and singulair

## 2023-04-01 DIAGNOSIS — J449 Chronic obstructive pulmonary disease, unspecified: Secondary | ICD-10-CM | POA: Diagnosis not present

## 2023-04-04 ENCOUNTER — Ambulatory Visit: Payer: Medicare PPO | Admitting: Nurse Practitioner

## 2023-04-06 ENCOUNTER — Other Ambulatory Visit: Payer: Self-pay | Admitting: Nurse Practitioner

## 2023-04-06 ENCOUNTER — Other Ambulatory Visit: Payer: Self-pay | Admitting: Student

## 2023-04-06 DIAGNOSIS — J449 Chronic obstructive pulmonary disease, unspecified: Secondary | ICD-10-CM

## 2023-04-16 ENCOUNTER — Other Ambulatory Visit: Payer: Self-pay | Admitting: Student

## 2023-04-16 DIAGNOSIS — J449 Chronic obstructive pulmonary disease, unspecified: Secondary | ICD-10-CM

## 2023-05-02 DIAGNOSIS — J449 Chronic obstructive pulmonary disease, unspecified: Secondary | ICD-10-CM | POA: Diagnosis not present

## 2023-05-06 ENCOUNTER — Other Ambulatory Visit: Payer: Self-pay | Admitting: Internal Medicine

## 2023-05-31 ENCOUNTER — Other Ambulatory Visit: Payer: Self-pay | Admitting: Student

## 2023-06-01 DIAGNOSIS — J449 Chronic obstructive pulmonary disease, unspecified: Secondary | ICD-10-CM | POA: Diagnosis not present

## 2023-06-14 ENCOUNTER — Other Ambulatory Visit: Payer: Self-pay | Admitting: Nurse Practitioner

## 2023-06-14 ENCOUNTER — Other Ambulatory Visit: Payer: Self-pay | Admitting: Internal Medicine

## 2023-06-14 DIAGNOSIS — J3089 Other allergic rhinitis: Secondary | ICD-10-CM

## 2023-06-27 ENCOUNTER — Ambulatory Visit (HOSPITAL_COMMUNITY)
Admission: EM | Admit: 2023-06-27 | Discharge: 2023-06-27 | Disposition: A | Discharge status: Home / Self Care | Payer: Medicare PPO | Attending: Family Medicine | Admitting: Family Medicine

## 2023-06-27 ENCOUNTER — Encounter (HOSPITAL_COMMUNITY): Payer: Self-pay

## 2023-06-27 ENCOUNTER — Telehealth (HOSPITAL_COMMUNITY): Payer: Self-pay | Admitting: *Deleted

## 2023-06-27 DIAGNOSIS — K0889 Other specified disorders of teeth and supporting structures: Secondary | ICD-10-CM

## 2023-06-27 DIAGNOSIS — I10 Essential (primary) hypertension: Secondary | ICD-10-CM

## 2023-06-27 MED ORDER — AMOXICILLIN 875 MG PO TABS: 875.0000 mg | ORAL_TABLET | Freq: Two times a day (BID) | ORAL | 0 refills | Status: AC

## 2023-06-27 MED ORDER — HYDROCODONE-ACETAMINOPHEN 5-325 MG PO TABS: 0.5000 | ORAL_TABLET | Freq: Four times a day (QID) | ORAL | 0 refills | Status: DC | PRN

## 2023-06-27 NOTE — Discharge Instructions (Addendum)
Your blood pressure was noted to be elevated during your visit today. If you are currently taking medication for high blood pressure, please ensure you are taking this as directed. If you do not have a history of high blood pressure and your blood pressure remains persistently elevated, you may need to begin taking a medication at some point. You may return here within the next few days to recheck if unable to see your primary care provider or if you do not have a one.  BP (!) 195/81 (BP Location: Left Arm)   Pulse 88   Temp 98.1 F (36.7 C) (Oral)   Resp 16   SpO2 98%   BP Readings from Last 3 Encounters:  06/27/23 (!) 195/81  03/15/23 (!) 142/90  12/26/22 130/86   Be aware, you have been prescribed pain medications that may cause drowsiness. While taking this medication, do not take any other medications containing acetaminophen (Tylenol). Do not combine with alcohol or recreational drugs. Please do not drive, operate heavy machinery, or take part in activities that require making important decisions while on this medication as your judgement may be clouded.

## 2023-06-27 NOTE — ED Provider Notes (Signed)
[facility]   [id number] 06/27/23 Arrival Time: 1150  ASSESSMENT & PLAN:  1. Pain, dental   2. Elevated blood pressure reading in office with diagnosis of hypertension    No sign of abscess requiring I&D at this time. Discussed.  Meds ordered this encounter  Medications   amoxicillin (AMOXIL) 875 MG tablet    Sig: Take 1 tablet (875 mg total) by mouth 2 (two) times daily for 10 days.    Dispense:  20 tablet    Refill:  0   HYDROcodone-acetaminophen (NORCO/VICODIN) 5-325 MG tablet    Sig: Take 0.5-1 tablets by mouth every 6 (six) hours as needed for moderate pain or severe pain.    Dispense:  6 tablet    Refill:  0    [place] Controlled Substances Registry consulted for this patient. I feel the risk/benefit ratio today is favorable for proceeding with this prescription for a controlled substance. Medication sedation precautions given.    Discharge Instructions      Your blood pressure was noted to be elevated during your visit today. If you are currently taking medication for high blood pressure, please ensure you are taking this as directed. If you do not have a history of high blood pressure and your blood pressure remains persistently elevated, you may need to begin taking a medication at some point. You may return here within the next few days to recheck if unable to see your primary care provider or if you do not have a one.  BP (!) 195/81 (BP Location: Left Arm)   Pulse 88   Temp 98.1 F (36.7 C) (Oral)   Resp 16   SpO2 98%   BP Readings from Last 3 Encounters:  06/27/23 (!) 195/81  03/15/23 (!) 142/90  12/26/22 130/86   Be aware, you have been prescribed pain medications that may cause drowsiness. While taking this medication, do not take any other medications containing acetaminophen (Tylenol). Do not combine with alcohol or recreational drugs. Please do not drive, operate heavy machinery, or take part in activities that require making important decisions  while on this medication as your judgement may be clouded.        Reviewed expectations re: course of current medical issues. Questions answered. Outlined signs and symptoms indicating need for more acute intervention. Patient verbalized understanding. After Visit Summary given.   SUBJECTIVE:  [name] is a 67 y.o. male who reports gradual onset of right lower dental pain described as aching/throbbing. Present for 2-3 days. Fever: absent. Tolerating PO intake but reports pain with chewing. Normal swallowing. He does have a dentist. No neck swelling or pain. OTC analgesics without relief.  Increased blood pressure noted today. Reports that he is treated for HTN. No symptoms.   OBJECTIVE: Vitals:   06/27/23 1353  BP: (!) 195/81  Pulse: 88  Resp: 16  Temp: 98.1 F (36.7 C)  TempSrc: Oral  SpO2: 98%    General appearance: alert; no distress HENT: normocephalic; atraumatic; dentition: fair; right lower gum without areas of fluctuance, drainage, or bleeding and with tenderness to palpation; normal jaw movement without difficulty Neck: supple without LAD; FROM; trachea midline Lungs: normal respirations; unlabored; speaks full sentences without difficulty Skin: warm and dry Psychological: alert and cooperative; normal mood and affect  Allergies  Allergen Reactions   Crestor [Rosuvastatin Calcium] Shortness Of Breath   Zestril [Lisinopril] Swelling   Spiriva Respimat [Tiotropium Bromide Monohydrate] Other (See Comments)    Red, glassy eyes   Tiotropium  Other (See Comments)    Red glassy eyes     Past Medical History:  Diagnosis Date   Asthma    COPD (chronic obstructive pulmonary disease) (HCC)    Hypertension    Shortness of breath    Social History   Socioeconomic History   Marital status: Married    Spouse name: Kyan Repke   Number of children: 1   Years of education: Not on file   Highest education level: Not on file  Occupational History    Occupation: Retired  Tobacco Use   Smoking status: Former    Current packs/day: 0.00    Average packs/day: 0.5 packs/day for 30.0 years (15.0 ttl pk-yrs)    Types: Cigarettes    Start date: 08/25/1990    Quit date: 08/25/2020    Years since quitting: 2.8   Smokeless tobacco: Never  Vaping Use   Vaping status: Never Used  Substance and Sexual Activity   Alcohol use: Yes    Comment: rare/occasional   Drug use: Not Currently   Sexual activity: Yes    Partners: Female  Other Topics Concern   Not on file  Social History Narrative   Not on file   Social Determinants of Health   Financial Resource Strain: Low Risk  (01/09/2023)   Overall Financial Resource Strain (CARDIA)    Difficulty of Paying Living Expenses: Not hard at all  Food Insecurity: No Food Insecurity (01/09/2023)   Hunger Vital Sign    Worried About Running Out of Food in the Last Year: Never true    Ran Out of Food in the Last Year: Never true  Transportation Needs: No Transportation Needs (01/09/2023)   PRAPARE - Administrator, Civil Service (Medical): No    Lack of Transportation (Non-Medical): No  Physical Activity: Sufficiently Active (01/09/2023)   Exercise Vital Sign    Days of Exercise per Week: 7 days    Minutes of Exercise per Session: 60 min  Stress: No Stress Concern Present (01/09/2023)   [employer] of Occupational Health - Occupational Stress Questionnaire    Feeling of Stress : Not at all  Social Connections: Moderately Integrated (01/09/2023)   Social Connection and Isolation Panel [NHANES]    Frequency of Communication with Friends and Family: More than three times a week    Frequency of Social Gatherings with Friends and Family: Twice a week    Attends Religious Services: More than 4 times per year    Active Member of [employer] or Organizations: No    Attends Banker Meetings: Never    Marital Status: Married  Catering manager Violence: Not At Risk (01/09/2023)    Humiliation, Afraid, Rape, and Kick questionnaire    Fear of Current or Ex-Partner: No    Emotionally Abused: No    Physically Abused: No    Sexually Abused: No   Family History  Problem Relation Age of Onset   Cancer Father    Past Surgical History:  Procedure Laterality Date   COLONOSCOPY        Mardella Layman, MD 06/27/23 1440

## 2023-06-27 NOTE — Telephone Encounter (Signed)
[facility] out of [facility]. Rx cancelled by RN. Resent to [facility] on [address] ordered this encounter  Medications   HYDROcodone-acetaminophen (NORCO/VICODIN) 5-325 MG tablet    Sig: Take 0.5-1 tablets by mouth every 6 (six) hours as needed for moderate pain or severe pain.    Dispense:  6 tablet    Refill:  0

## 2023-06-27 NOTE — ED Triage Notes (Addendum)
Here for dental pain and swelling x 3 days. Pt took tylenol this morning.

## 2023-07-02 DIAGNOSIS — J449 Chronic obstructive pulmonary disease, unspecified: Secondary | ICD-10-CM | POA: Diagnosis not present

## 2023-08-02 DIAGNOSIS — J449 Chronic obstructive pulmonary disease, unspecified: Secondary | ICD-10-CM | POA: Diagnosis not present

## 2023-08-03 ENCOUNTER — Other Ambulatory Visit: Payer: Self-pay | Admitting: Nurse Practitioner

## 2023-08-07 ENCOUNTER — Telehealth: Payer: Self-pay | Admitting: Pharmacist

## 2023-08-07 NOTE — Telephone Encounter (Signed)
Completed phone call to patient for follow-up of blood pressure/hypertension control.   Scheduled appointment for 08/09/23.  Total time with patient call and documentation of interaction: 5 minutes.

## 2023-08-09 ENCOUNTER — Ambulatory Visit: Payer: Medicare PPO | Admitting: Pharmacist

## 2023-08-12 ENCOUNTER — Other Ambulatory Visit: Payer: Self-pay | Admitting: Nurse Practitioner

## 2023-08-12 DIAGNOSIS — J449 Chronic obstructive pulmonary disease, unspecified: Secondary | ICD-10-CM

## 2023-08-27 ENCOUNTER — Other Ambulatory Visit: Payer: Self-pay | Admitting: Student

## 2023-08-27 ENCOUNTER — Other Ambulatory Visit: Payer: Self-pay | Admitting: Internal Medicine

## 2023-08-31 ENCOUNTER — Other Ambulatory Visit: Payer: Self-pay | Admitting: Nurse Practitioner

## 2023-08-31 ENCOUNTER — Telehealth: Payer: Self-pay | Admitting: Pulmonary Disease

## 2023-08-31 DIAGNOSIS — J449 Chronic obstructive pulmonary disease, unspecified: Secondary | ICD-10-CM

## 2023-08-31 MED ORDER — PREDNISONE 10 MG PO TABS: 10.0000 mg | ORAL_TABLET | Freq: Every day | ORAL | 5 refills | Status: DC

## 2023-08-31 NOTE — Telephone Encounter (Signed)
PT needs a refill of Pred. The RX he has is  "Outdated" according to the Pharm.  [facility] [address]  U[facility] when needed and he needs it.   [facility]

## 2023-09-01 DIAGNOSIS — J449 Chronic obstructive pulmonary disease, unspecified: Secondary | ICD-10-CM | POA: Diagnosis not present

## 2023-09-01 NOTE — Telephone Encounter (Signed)
Spoke with patient regarding prior message. Prednisone was sent into patient's pharmacy . Patient's voice was understanding.Nothing else further needed at this time .

## 2023-09-29 ENCOUNTER — Other Ambulatory Visit: Payer: Self-pay

## 2023-09-29 ENCOUNTER — Emergency Department (HOSPITAL_COMMUNITY)
Admission: EM | Admit: 2023-09-29 | Discharge: 2023-09-29 | Disposition: A | Discharge status: Home / Self Care | Payer: Medicare PPO | Attending: Emergency Medicine | Admitting: Emergency Medicine

## 2023-09-29 DIAGNOSIS — I10 Essential (primary) hypertension: Secondary | ICD-10-CM | POA: Diagnosis not present

## 2023-09-29 DIAGNOSIS — J45909 Unspecified asthma, uncomplicated: Secondary | ICD-10-CM | POA: Diagnosis not present

## 2023-09-29 DIAGNOSIS — Z87891 Personal history of nicotine dependence: Secondary | ICD-10-CM | POA: Insufficient documentation

## 2023-09-29 DIAGNOSIS — J449 Chronic obstructive pulmonary disease, unspecified: Secondary | ICD-10-CM | POA: Insufficient documentation

## 2023-09-29 DIAGNOSIS — K0889 Other specified disorders of teeth and supporting structures: Secondary | ICD-10-CM | POA: Diagnosis not present

## 2023-09-29 MED ORDER — NAPROXEN 500 MG PO TABS: 500.0000 mg | ORAL_TABLET | Freq: Two times a day (BID) | ORAL | 0 refills | Status: DC

## 2023-09-29 MED ORDER — NAPROXEN 250 MG PO TABS
500.0000 mg | ORAL_TABLET | Freq: Once | ORAL | Status: AC
  Administered 2023-09-29: 500 mg via ORAL
  Filled 2023-09-29: qty 2

## 2023-09-29 MED ORDER — AMOXICILLIN 500 MG PO CAPS: 500.0000 mg | ORAL_CAPSULE | Freq: Three times a day (TID) | ORAL | 0 refills | Status: DC

## 2023-09-29 MED ORDER — AMOXICILLIN 500 MG PO CAPS: 500.0000 mg | ORAL_CAPSULE | Freq: Three times a day (TID) | ORAL | 0 refills | Status: AC

## 2023-09-29 MED ORDER — OXYCODONE-ACETAMINOPHEN 5-325 MG PO TABS
1.0000 | ORAL_TABLET | Freq: Once | ORAL | Status: AC
  Administered 2023-09-29: 1 via ORAL
  Filled 2023-09-29: qty 1

## 2023-09-29 MED ORDER — AMOXICILLIN 500 MG PO CAPS
500.0000 mg | ORAL_CAPSULE | Freq: Once | ORAL | Status: AC
  Administered 2023-09-29: 500 mg via ORAL
  Filled 2023-09-29: qty 1

## 2023-09-29 MED ORDER — OXYCODONE HCL 5 MG PO TABS
5.0000 mg | ORAL_TABLET | Freq: Once | ORAL | Status: AC
  Administered 2023-09-29: 5 mg via ORAL
  Filled 2023-09-29: qty 1

## 2023-09-29 NOTE — Discharge Instructions (Signed)
You were evaluated in the Emergency Department and after careful evaluation, we did not find any emergent condition requiring admission or further testing in the hospital.  Your exam/testing today is overall reassuring.  Symptoms seem to be due to a tooth infection.  Take the amoxicillin antibiotic as directed, use the Naprosyn anti-inflammatory twice daily for pain.  Follow-up with a dentist.  Please return to the Emergency Department if you experience any worsening of your condition.   Thank you for allowing [place] to be a part of your care.

## 2023-09-29 NOTE — ED Provider Notes (Signed)
[facility]-EMERGENCY DEPT [facility] Emergency Department Provider Note MRN:  [id number]  Arrival date & time: 09/29/23     Chief Complaint   Dental Pain   History of Present Illness   [name] is a 67 y.o. year-old male with a history of hypertension, COPD presenting to the ED with chief complaint of dental pain.  Left lower toothache for the past few days, getting worse, trouble sleeping due to the pain.  No fever, no other complaints.  Review of Systems  A thorough review of systems was obtained and all systems are negative except as noted in the HPI and PMH.   Patient's Health History    Past Medical History:  Diagnosis Date   Asthma    COPD (chronic obstructive pulmonary disease) (HCC)    Hypertension    Shortness of breath     Past Surgical History:  Procedure Laterality Date   COLONOSCOPY      Family History  Problem Relation Age of Onset   Cancer Father     Social History   Socioeconomic History   Marital status: Married    Spouse name: Elven Groome   Number of children: 1   Years of education: Not on file   Highest education level: Not on file  Occupational History   Occupation: Retired  Tobacco Use   Smoking status: Former    Current packs/day: 0.00    Average packs/day: 0.5 packs/day for 30.0 years (15.0 ttl pk-yrs)    Types: Cigarettes    Start date: 08/25/1990    Quit date: 08/25/2020    Years since quitting: 3.0   Smokeless tobacco: Never  Vaping Use   Vaping status: Never Used  Substance and Sexual Activity   Alcohol use: Yes    Comment: rare/occasional   Drug use: Not Currently   Sexual activity: Yes    Partners: Female  Other Topics Concern   Not on file  Social History Narrative   Not on file   Social Determinants of Health   Financial Resource Strain: Low Risk  (01/09/2023)   Overall Financial Resource Strain (CARDIA)    Difficulty of Paying Living Expenses: Not hard at all  Food Insecurity: No Food Insecurity (01/09/2023)    Hunger Vital Sign    Worried About Running Out of Food in the Last Year: Never true    Ran Out of Food in the Last Year: Never true  Transportation Needs: No Transportation Needs (01/09/2023)   PRAPARE - Administrator, Civil Service (Medical): No    Lack of Transportation (Non-Medical): No  Physical Activity: Sufficiently Active (01/09/2023)   Exercise Vital Sign    Days of Exercise per Week: 7 days    Minutes of Exercise per Session: 60 min  Stress: No Stress Concern Present (01/09/2023)   [employer] of Occupational Health - Occupational Stress Questionnaire    Feeling of Stress : Not at all  Social Connections: Moderately Integrated (01/09/2023)   Social Connection and Isolation Panel [NHANES]    Frequency of Communication with Friends and Family: More than three times a week    Frequency of Social Gatherings with Friends and Family: Twice a week    Attends Religious Services: More than 4 times per year    Active Member of [employer] or Organizations: No    Attends Banker Meetings: Never    Marital Status: Married  Catering manager Violence: Not At Risk (01/09/2023)   Humiliation, Afraid, Rape, and Kick questionnaire  Fear of Current or Ex-Partner: No    Emotionally Abused: No    Physically Abused: No    Sexually Abused: No     Physical Exam   Vitals:   09/29/23 0044  BP: (!) 191/94  Pulse: 89  Resp: 17  Temp: 98 F (36.7 C)  SpO2: 99%    CONSTITUTIONAL: Well-appearing, NAD NEURO/PSYCH:  Alert and oriented x 3, no focal deficits EYES:  eyes equal and reactive ENT/NECK:  no LAD, no JVD CARDIO: Regular rate, well-perfused, normal S1 and S2 PULM:  CTAB no wheezing or rhonchi GI/GU:  non-distended, non-tender MSK/SPINE:  No gross deformities, no edema SKIN:  no rash, atraumatic   *Additional and/or pertinent findings included in MDM below  Diagnostic and Interventional Summary    EKG Interpretation Date/Time:    Ventricular Rate:     PR Interval:    QRS Duration:    QT Interval:    QTC Calculation:   R Axis:      Text Interpretation:         Labs Reviewed - No data to display  No orders to display    Medications  amoxicillin (AMOXIL) capsule 500 mg (has no administration in time range)  oxyCODONE (Oxy IR/ROXICODONE) immediate release tablet 5 mg (has no administration in time range)  naproxen (NAPROSYN) tablet 500 mg (has no administration in time range)  oxyCODONE-acetaminophen (PERCOCET/ROXICET) 5-325 MG per tablet 1 tablet (1 tablet Oral Given 09/29/23 0048)     Procedures  /  Critical Care Procedures  ED Course and Medical Decision Making  Initial Impression and Ddx Tooth numbers 29 and 30 have multiple cavities and are tender to palpation, no signs of abscess or more significant contiguous infection within the mouth or soft tissues of the neck.  Vital signs normal.  Past medical/surgical history that increases complexity of ED encounter: None  Interpretation of Diagnostics Laboratory and/or imaging options to aid in the diagnosis/care of the patient were considered.  After careful history and physical examination, it was determined that there was no indication for diagnostics at this time.  Patient Reassessment and Ultimate Disposition/Management     Discharge  Patient management required discussion with the following services or consulting groups:  None  Complexity of Problems Addressed Acute complicated illness or Injury  Additional Data Reviewed and Analyzed Further history obtained from: None  Additional Factors Impacting ED Encounter Risk Prescriptions  Tell Demchak. Pilar Plate, MD [facility] Emergency Medicine [facility] [email or web address]  Final Clinical Impressions(s) / ED Diagnoses     ICD-10-CM   1. Pain, dental  K08.89       ED Discharge Orders          Ordered    amoxicillin (AMOXIL) 500 MG capsule  3 times daily        09/29/23 0224    naproxen  (NAPROSYN) 500 MG tablet  2 times daily        09/29/23 0224             Discharge Instructions Discussed with and Provided to Patient:    Discharge Instructions      You were evaluated in the Emergency Department and after careful evaluation, we did not find any emergent condition requiring admission or further testing in the hospital.  Your exam/testing today is overall reassuring.  Symptoms seem to be due to a tooth infection.  Take the amoxicillin antibiotic as directed, use the Naprosyn anti-inflammatory twice daily for pain.  Follow-up with a dentist.  Please return to the Emergency Department if you experience any worsening of your condition.   Thank you for allowing [place] to be a part of your care.      Sabas Sous, MD 09/29/23 Emeline Darling

## 2023-09-29 NOTE — ED Triage Notes (Addendum)
Patient reports right lower molar pain for several days unrelieved by OTC pain medications . Hypertensive at triage .

## 2023-10-02 DIAGNOSIS — J449 Chronic obstructive pulmonary disease, unspecified: Secondary | ICD-10-CM | POA: Diagnosis not present

## 2023-10-03 ENCOUNTER — Other Ambulatory Visit (HOSPITAL_BASED_OUTPATIENT_CLINIC_OR_DEPARTMENT_OTHER): Payer: Self-pay

## 2023-10-03 DIAGNOSIS — J449 Chronic obstructive pulmonary disease, unspecified: Secondary | ICD-10-CM

## 2023-10-04 ENCOUNTER — Encounter (HOSPITAL_BASED_OUTPATIENT_CLINIC_OR_DEPARTMENT_OTHER): Payer: Medicare PPO

## 2023-10-04 ENCOUNTER — Ambulatory Visit (HOSPITAL_BASED_OUTPATIENT_CLINIC_OR_DEPARTMENT_OTHER): Payer: Medicare PPO | Admitting: Pulmonary Disease

## 2023-10-04 ENCOUNTER — Encounter (HOSPITAL_BASED_OUTPATIENT_CLINIC_OR_DEPARTMENT_OTHER): Payer: Self-pay | Admitting: Pulmonary Disease

## 2023-10-09 ENCOUNTER — Telehealth: Payer: Self-pay

## 2023-10-09 ENCOUNTER — Other Ambulatory Visit: Payer: Self-pay | Admitting: Student

## 2023-10-09 NOTE — Telephone Encounter (Signed)
Unable to reach patient, LVM informing him to schedule a follow up appointment for his blood pressure. Penni Bombard CMA

## 2023-10-09 NOTE — Telephone Encounter (Signed)
-----   Message from [facility] sent at 10/09/2023 11:54 AM EST ----- Regarding: BP Follow up Taking care of Dr. Charlyne Mom inbox now-patient needs follow up for blood pressure soon in clinic.

## 2023-10-16 ENCOUNTER — Telehealth: Payer: Self-pay

## 2023-10-16 NOTE — Telephone Encounter (Signed)
Transition Care Management Unsuccessful Follow-up Telephone Call  Date of discharge and from where:  09/29/2023 [facility]  Attempts:  2nd Attempt  Reason for unsuccessful TCM follow-up call:  Left voice message  Starleen Trussell [facility]  [facility] Resource Care Guide Direct Dial: [phone]  Website: [email or web address]

## 2023-10-16 NOTE — Telephone Encounter (Signed)
Transition Care Management Unsuccessful Follow-up Telephone Call  Date of discharge and from where:  09/29/2023 [facility]  Attempts:  1st Attempt  Reason for unsuccessful TCM follow-up call:  Left voice message  Keno Caraway [facility]  [facility], [facility] Resource Care Guide Direct Dial: [phone]  Website: [email or web address]

## 2023-10-17 ENCOUNTER — Ambulatory Visit: Payer: Medicare PPO | Admitting: Student

## 2023-10-17 NOTE — Progress Notes (Deleted)
  SUBJECTIVE:   CHIEF COMPLAINT / HPI:   HTN Meds: Combo pill HCTZ 12.5 mg/Losartan 50 mg Metoprolol XR 25 mg Spironolactone 12.5 mg  CHF F/u w/ Cardiology?  Health Maintenance  Dtap? Colonoscopy?   PERTINENT  PMH / PSH: ***  Past Medical History:  Diagnosis Date   Asthma    COPD (chronic obstructive pulmonary disease) (HCC)    Hypertension    Shortness of breath    OBJECTIVE:  There were no vitals taken for this visit. Physical Exam   ASSESSMENT/PLAN:   Assessment & Plan  No follow-ups on file. [name], MD 10/17/2023, 7:18 AM PGY-***, [facility] Family Medicine {    This will disappear when note is signed, click to select method of visit    :1}

## 2023-11-01 DIAGNOSIS — J449 Chronic obstructive pulmonary disease, unspecified: Secondary | ICD-10-CM | POA: Diagnosis not present

## 2023-11-03 ENCOUNTER — Other Ambulatory Visit: Payer: Self-pay | Admitting: Acute Care

## 2023-11-03 DIAGNOSIS — Z122 Encounter for screening for malignant neoplasm of respiratory organs: Secondary | ICD-10-CM

## 2023-11-03 DIAGNOSIS — Z87891 Personal history of nicotine dependence: Secondary | ICD-10-CM

## 2023-11-08 ENCOUNTER — Telehealth: Payer: Self-pay

## 2023-11-08 NOTE — Telephone Encounter (Signed)
Received VM requesting returned call to schedule appointment regarding possible acid reflux.   Returned call to patient regarding scheduling.   Patient reports that for the last year he has been experiencing intermittent issues with a burning/stabbing sensation in his chest. He reports that this occurs primarily after drinking water. He does report drinking water from a straw and is unsure if he is "drinking in air."   Patient requesting to schedule with Dr. Barbaraann Faster as soon as possible. Advised that PCP does not have any availability until January.   Scheduled with Dr. Marisue Humble for Monday afternoon.   ED precautions discussed.   Veronda Prude, RN

## 2023-11-12 NOTE — Progress Notes (Unsigned)
    SUBJECTIVE:   CHIEF COMPLAINT / HPI:   Mid-Central Chest Pain Patient presents with episodic sharp pain in the middle of his chest when drinking cold beverages.  He says that this been going on for quite some time and only happens after he drinks something cold, could be alcoholic or nonalcoholic.  He was not particularly concerned about these episodes but after his wife observed one of them she urged him to come in for evaluation.  He denies any associated dyspnea, N/V.  No symptoms of reflux or heartburn.  His pain typically lasts "5 or 6 seconds" and never more than a minute.  He tells me he feels like his drink "gets caught" in his esophagus.  PERTINENT  PMH / PSH: CHF, cardiomyopathy of uncertain etiology, COPD, Home O2 at night.   OBJECTIVE:   BP 133/76   Pulse 90   Ht 5\' 9"  (1.753 m)   Wt 163 lb (73.9 kg)   SpO2 100%   BMI 24.07 kg/m   Gen: well appearing and NAD  HENT: Trachea midline, neck supple without mass, oropharynx clear Cardio: RRR, without murmur, rub, or gallop Pulm: Normal WOB on RA, lungs are clear throughout  Abd: Non-tender, non-distended   ASSESSMENT/PLAN:   Chest pain  Suspected Esophageal Spasm Chest pain only with drinking cold fluids.  His description of symptoms does not sound ischemic in nature.  I actually have the highest suspicion for esophageal spasm given the association with cold fluids and sharp pain that only last 5 or 6 seconds. -Referred to GI for evaluation of esophageal spasm, also needs colonoscopy -Reviewed ER precautions for chest pain lasting more than a few minutes, not fitting with this pattern      J Dorothyann Gibbs, MD [facility]

## 2023-11-13 ENCOUNTER — Encounter: Payer: Self-pay | Admitting: Student

## 2023-11-13 ENCOUNTER — Ambulatory Visit: Payer: Medicare PPO | Admitting: Student

## 2023-11-13 VITALS — BP 133/76 | HR 90 | Ht 69.0 in | Wt 163.0 lb

## 2023-11-13 DIAGNOSIS — Z23 Encounter for immunization: Secondary | ICD-10-CM

## 2023-11-13 DIAGNOSIS — R079 Chest pain, unspecified: Secondary | ICD-10-CM

## 2023-11-13 DIAGNOSIS — K224 Dyskinesia of esophagus: Secondary | ICD-10-CM | POA: Diagnosis not present

## 2023-11-13 NOTE — Assessment & Plan Note (Signed)
Chest pain only with drinking cold fluids.  His description of symptoms does not sound ischemic in nature.  I actually have the highest suspicion for esophageal spasm given the association with cold fluids and sharp pain that only last 5 or 6 seconds. -Referred to GI for evaluation of esophageal spasm, also needs colonoscopy -Reviewed ER precautions for chest pain lasting more than a few minutes, not fitting with this pattern

## 2023-11-13 NOTE — Patient Instructions (Signed)
Mr. Bonomo,  The symptoms you are having sound like they are from esophageal spasm. I'd like to have you seen by our GI partners. They will call you to set this up.  As long as these episodes are only a few seconds long and associated with drinking cold fluids, I doubt this is related to your heart. If, however, you develop chest pain in this area that lasts more than a few minutes or feels different than what you have now, please go to the ER to get your heart checked out.  Eliezer Mccoy, MD

## 2023-11-18 ENCOUNTER — Other Ambulatory Visit: Payer: Self-pay | Admitting: Nurse Practitioner

## 2023-11-18 ENCOUNTER — Other Ambulatory Visit: Payer: Self-pay | Admitting: Internal Medicine

## 2023-11-18 DIAGNOSIS — J449 Chronic obstructive pulmonary disease, unspecified: Secondary | ICD-10-CM

## 2023-11-18 DIAGNOSIS — J3089 Other allergic rhinitis: Secondary | ICD-10-CM

## 2023-12-04 ENCOUNTER — Encounter: Payer: Self-pay | Admitting: Acute Care

## 2023-12-05 ENCOUNTER — Encounter: Payer: Self-pay | Admitting: Acute Care

## 2023-12-12 ENCOUNTER — Inpatient Hospital Stay: Admission: RE | Admit: 2023-12-12 | Payer: Medicare PPO | Source: Ambulatory Visit

## 2023-12-25 ENCOUNTER — Other Ambulatory Visit: Payer: Self-pay | Admitting: Student

## 2023-12-25 DIAGNOSIS — J449 Chronic obstructive pulmonary disease, unspecified: Secondary | ICD-10-CM

## 2023-12-30 ENCOUNTER — Other Ambulatory Visit: Payer: Self-pay | Admitting: Student

## 2024-01-04 ENCOUNTER — Encounter: Payer: Self-pay | Admitting: Gastroenterology

## 2024-01-04 ENCOUNTER — Ambulatory Visit (INDEPENDENT_AMBULATORY_CARE_PROVIDER_SITE_OTHER): Payer: Medicare HMO | Admitting: Gastroenterology

## 2024-01-04 VITALS — BP 140/74 | HR 88 | Ht 67.75 in | Wt 165.4 lb

## 2024-01-04 DIAGNOSIS — R1310 Dysphagia, unspecified: Secondary | ICD-10-CM | POA: Insufficient documentation

## 2024-01-04 DIAGNOSIS — Z1211 Encounter for screening for malignant neoplasm of colon: Secondary | ICD-10-CM | POA: Insufficient documentation

## 2024-01-04 NOTE — Progress Notes (Signed)
01/04/2024 [name] [id number] 05/24/1956   HISTORY OF PRESENT ILLNESS: This is a 68 year old male who is new to our office.  He has been referred here for evaluation of "esophageal spasm".  He tells me that for the past couple of years he has been having issues with swallowing both solids and liquids.  Says that has been getting worse.  He will feel food get hung up and sometimes even then with drinking just liquids like out of a straw, etc. it will feel like is not going down correctly and then will cause pain through his chest.  Sounds like he does have frequent heartburn or reflux and eats Tums often according to his wife.  He is not on any acid reflux medication otherwise.  Tells me he had a colonoscopy well over 10 years ago somewhere else here in [place].  He does have COPD and is on oxygen as needed.  No issues with moving his bowels and no rectal bleeding.  Past Medical History:  Diagnosis Date   Asthma    Colon polyp    COPD (chronic obstructive pulmonary disease) (HCC)    Emphysema lung (HCC)    Heart failure (HCC)    HLD (hyperlipidemia)    Hypertension    Shortness of breath    Past Surgical History:  Procedure Laterality Date   COLONOSCOPY     LIPOMA EXCISION      reports that he has been smoking cigarettes. He started smoking about 33 years ago. He has a 16.7 pack-year smoking history. He has never used smokeless tobacco. He reports current alcohol use. He reports that he does not currently use drugs. family history includes Anemia in his son; Bladder Cancer in his father; Dementia in his mother; Heart disease in his father; Hypertension in his brother, brother, and son; Lung cancer in his father. Allergies  Allergen Reactions   Crestor [Rosuvastatin Calcium] Shortness Of Breath   Zestril [Lisinopril] Swelling   Spiriva Respimat [Tiotropium Bromide Monohydrate] Other (See Comments)    Red, glassy eyes   Tiotropium Other (See Comments)    Red glassy eyes        Outpatient Encounter Medications as of 01/04/2024  Medication Sig   albuterol (VENTOLIN HFA) 108 (90 Base) MCG/ACT inhaler INHALE 2 PUFFS EVERY 6 HOURS AS NEEDED FOR WHEEZING OR SHORTNESS OF BREATH.   cetirizine (ZYRTEC) 10 MG tablet TAKE 1 TABLET BY MOUTH EVERY DAY   fluticasone (FLONASE) 50 MCG/ACT nasal spray PLACE 2 SPRAYS INTO BOTH NOSTRILS DAILY   ipratropium-albuterol (DUONEB) 0.5-2.5 (3) MG/3ML SOLN INHALE 1 VIAL VIA NEBULIZER EVERY 6 HOURS AS NEEDED FOR SHORTNESS OF BREATH / WHEEZING   JARDIANCE 10 MG TABS tablet TAKE 1 TABLET BY MOUTH DAILY BEFORE BREAKFAST.   losartan (COZAAR) 25 MG tablet TAKE 1 TABLET (25 MG TOTAL) BY MOUTH DAILY.   losartan-hydrochlorothiazide (HYZAAR) 50-12.5 MG tablet TAKE 1 TABLET BY MOUTH EVERY DAY   metoprolol succinate (TOPROL-XL) 25 MG 24 hr tablet TAKE 1/2 TABLET BY MOUTH DAILY   montelukast (SINGULAIR) 10 MG tablet TAKE 1 TABLET BY MOUTH EVERYDAY AT BEDTIME   predniSONE (DELTASONE) 10 MG tablet Take 1 tablet (10 mg total) by mouth daily with breakfast.   spironolactone (ALDACTONE) 25 MG tablet TAKE 0.5 TABLETS (12.5 MG TOTAL) BY MOUTH DAILY. NEED OV.   TRELEGY ELLIPTA 200-62.5-25 MCG/ACT AEPB INHALE 1 PUFF BY MOUTH EVERY DAY   rosuvastatin (CRESTOR) 20 MG tablet TAKE 1/2 TABLET BY MOUTH DAILY (Patient not  taking: Reported on 01/04/2024)   [DISCONTINUED] HYDROcodone-acetaminophen (NORCO/VICODIN) 5-325 MG tablet Take 0.5-1 tablets by mouth every 6 (six) hours as needed for moderate pain or severe pain.   [DISCONTINUED] naproxen (NAPROSYN) 500 MG tablet Take 1 tablet (500 mg total) by mouth 2 (two) times daily.   No facility-administered encounter medications on file as of 01/04/2024.    REVIEW OF SYSTEMS  : All other systems reviewed and negative except where noted in the History of Present Illness.   PHYSICAL EXAM: BP (!) 140/74 (BP Location: Left Arm, Patient Position: Sitting, Cuff Size: Normal)   Pulse 88   Ht 5' 7.75" (1.721 m) Comment:  height measured without shoes  Wt 165 lb 6 oz (75 kg)   BMI 25.33 kg/m  General: Well developed male in no acute distress Head: Normocephalic and atraumatic Eyes:  Sclerae anicteric, conjunctiva pink. Ears: Normal auditory acuity Lungs: Clear throughout to auscultation; no W/R/R. Heart: Regular rate and rhythm; no M/R/G. Abdomen: Soft, non-distended.  BS present.  Non-tender. Rectal:  Will be done at the time of colonoscopy. Musculoskeletal: Symmetrical with no gross deformities  Skin: No lesions on visible extremities Neurological: Alert oriented x 4, grossly non-focal Psychological:  Alert and cooperative. Normal mood and affect  ASSESSMENT AND PLAN: *Dysphagia: Having issues with swallowing with both solids and liquids at times for the past couple of years, somewhat worsening.  Also gets sharp pains that occur with this that he refers to his spasm.  Sounds like he has frequent heartburn and reflux and eats Tums quite frequently.  Will plan for EGD to rule out esophagitis versus stricture, etc.  Sounds like he may need PPI therapy.  This is being scheduled with Dr. Leone Payor at [facility] due to his oxygen use. *CRC screening:  Last colonoscopy was over 10 years ago at a different facility.  Will schedule that with Dr. Leone Payor as well.  **The risks, benefits, and alternatives to EGD and colonoscopy were discussed with the patient and he consents to proceed.   CC:  Anthony Kinds, MD

## 2024-01-04 NOTE — Patient Instructions (Signed)
You have been scheduled for an endoscopy and colonoscopy. Please follow the written instructions given to you at your visit today.  If you use inhalers (even only as needed), please bring them with you on the day of your procedure.  DO NOT TAKE 7 DAYS PRIOR TO TEST- Trulicity (dulaglutide) Ozempic, Wegovy (semaglutide) Mounjaro (tirzepatide) Bydureon Bcise (exanatide extended release)  DO NOT TAKE 1 DAY PRIOR TO YOUR TEST Rybelsus (semaglutide) Adlyxin (lixisenatide) Victoza (liraglutide) Byetta (exanatide) ______________________________________________________________________  _______________________________________________________  If your blood pressure at your visit was 140/90 or greater, please contact your primary care physician to follow up on this.  _______________________________________________________  If you are age 50 or older, your body mass index should be between 23-30. Your Body mass index is 25.33 kg/m. If this is out of the aforementioned range listed, please consider follow up with your Primary Care Provider.  If you are age 34 or younger, your body mass index should be between 19-25. Your Body mass index is 25.33 kg/m. If this is out of the aformentioned range listed, please consider follow up with your Primary Care Provider.   ________________________________________________________  The [place] [facility] providers would like to encourage you to use [facility] to communicate with providers for non-urgent requests or questions.  Due to long hold times on the telephone, sending your provider a message by [facility] may be a faster and more efficient way to get a response.  Please allow 48 business hours for a response.  Please remember that this is for non-urgent requests.  _______________________________________________________

## 2024-01-05 ENCOUNTER — Ambulatory Visit
Admission: RE | Admit: 2024-01-05 | Discharge: 2024-01-05 | Disposition: A | Discharge status: Home / Self Care | Payer: Medicare HMO | Source: Ambulatory Visit | Attending: Acute Care | Admitting: Acute Care

## 2024-01-05 DIAGNOSIS — Z87891 Personal history of nicotine dependence: Secondary | ICD-10-CM

## 2024-01-05 DIAGNOSIS — Z122 Encounter for screening for malignant neoplasm of respiratory organs: Secondary | ICD-10-CM

## 2024-01-15 ENCOUNTER — Other Ambulatory Visit: Payer: Self-pay | Admitting: Internal Medicine

## 2024-01-15 ENCOUNTER — Telehealth: Payer: Self-pay

## 2024-01-23 ENCOUNTER — Other Ambulatory Visit: Payer: Self-pay

## 2024-01-23 DIAGNOSIS — Z122 Encounter for screening for malignant neoplasm of respiratory organs: Secondary | ICD-10-CM

## 2024-01-23 DIAGNOSIS — Z87891 Personal history of nicotine dependence: Secondary | ICD-10-CM

## 2024-02-08 ENCOUNTER — Telehealth: Payer: Self-pay | Admitting: Gastroenterology

## 2024-02-08 ENCOUNTER — Telehealth: Payer: Self-pay

## 2024-02-08 NOTE — Telephone Encounter (Signed)
 Patient LVM on nurse line regarding change in insurance company. He reports that he will need POC to be sent to another medical equipment store.   Returned call to patient. He did not answer, unable to leave VM.   When patient calls back, please ask where new order needs to be sent to.   Veronda Prude, RN

## 2024-02-08 NOTE — Telephone Encounter (Addendum)
 Procedure:Colonoscopy/Endoscopy Procedure date: 02/15/24 Procedure location: [facility] Arrival Time: 7:30 am Spoke with the patient Y/N:   No, I left a detailed message for the patient to return call  02/08/24 @ 1:36 am No, I left a detailed message for the patient to return call 02/09/24 @ 11:03 am  02/12/24 @ 3:55 talked to patient wife and she stated that pt had not been home for 3 days and he went to the liquor house and when he did come home he acted a fool. She stated she don't know if pt will come for the procedures or not. But wanted to inform what's going on.  Any prep concerns? ___  Has the patient obtained the prep from the pharmacy ? ___ Do you have a care partner and transportation: ___ Any additional concerns? ___

## 2024-02-11 ENCOUNTER — Other Ambulatory Visit: Payer: Self-pay | Admitting: Nurse Practitioner

## 2024-02-11 DIAGNOSIS — J449 Chronic obstructive pulmonary disease, unspecified: Secondary | ICD-10-CM

## 2024-02-15 ENCOUNTER — Encounter (HOSPITAL_COMMUNITY): Payer: Self-pay

## 2024-02-15 ENCOUNTER — Encounter (HOSPITAL_COMMUNITY): Admission: RE | Payer: Self-pay | Source: Home / Self Care

## 2024-02-15 ENCOUNTER — Ambulatory Visit (HOSPITAL_COMMUNITY): Admission: RE | Admit: 2024-02-15 | Payer: Medicare HMO | Source: Home / Self Care | Admitting: Internal Medicine

## 2024-02-15 SURGERY — COLONOSCOPY WITH PROPOFOL
Anesthesia: Monitor Anesthesia Care

## 2024-02-15 NOTE — Anesthesia Preprocedure Evaluation (Signed)
 Anesthesia Evaluation    Reviewed: Allergy & Precautions, Patient's Chart, lab work & pertinent test results  History of Anesthesia Complications Negative for: history of anesthetic complications  Airway        Dental   Pulmonary asthma , COPD,  COPD inhaler and oxygen dependent, Current Smoker          Cardiovascular hypertension, Pt. on medications and Pt. on home beta blockers +CHF     '23 TTE - EF 35%. Global hypokinesis. Right ventricular systolic function is mildly reduced. Mild mitral valve regurgitation. Aortic valve regurgitation is trivial.     Neuro/Psych negative neurological ROS  negative psych ROS   GI/Hepatic negative GI ROS, Neg liver ROS,,,  Endo/Other  negative endocrine ROS    Renal/GU negative Renal ROS     Musculoskeletal negative musculoskeletal ROS (+)    Abdominal   Peds  Hematology negative hematology ROS (+)   Anesthesia Other Findings   Reproductive/Obstetrics                             Anesthesia Physical Anesthesia Plan  ASA: 4  Anesthesia Plan: MAC   Post-op Pain Management: Minimal or no pain anticipated   Induction:   PONV Risk Score and Plan: 1 and Propofol infusion and Treatment may vary due to age or medical condition  Airway Management Planned: Nasal Cannula and Natural Airway  Additional Equipment: None  Intra-op Plan:   Post-operative Plan:   Informed Consent:   Plan Discussed with: CRNA and Anesthesiologist  Anesthesia Plan Comments:        Anesthesia Quick Evaluation

## 2024-02-23 ENCOUNTER — Other Ambulatory Visit: Payer: Self-pay | Admitting: Internal Medicine

## 2024-03-01 ENCOUNTER — Other Ambulatory Visit: Payer: Self-pay | Admitting: Internal Medicine

## 2024-03-01 ENCOUNTER — Other Ambulatory Visit: Payer: Self-pay | Admitting: Pulmonary Disease

## 2024-03-01 DIAGNOSIS — J449 Chronic obstructive pulmonary disease, unspecified: Secondary | ICD-10-CM

## 2024-03-04 DIAGNOSIS — L89124 Pressure ulcer of left upper back, stage 4: Secondary | ICD-10-CM | POA: Diagnosis not present

## 2024-03-06 ENCOUNTER — Other Ambulatory Visit: Payer: Self-pay | Admitting: Nurse Practitioner

## 2024-03-06 DIAGNOSIS — J3089 Other allergic rhinitis: Secondary | ICD-10-CM

## 2024-03-06 DIAGNOSIS — J449 Chronic obstructive pulmonary disease, unspecified: Secondary | ICD-10-CM

## 2024-03-18 ENCOUNTER — Other Ambulatory Visit: Payer: Self-pay | Admitting: Internal Medicine

## 2024-03-23 ENCOUNTER — Other Ambulatory Visit: Payer: Self-pay | Admitting: Nurse Practitioner

## 2024-03-23 DIAGNOSIS — J449 Chronic obstructive pulmonary disease, unspecified: Secondary | ICD-10-CM

## 2024-04-04 ENCOUNTER — Encounter (HOSPITAL_COMMUNITY): Payer: Self-pay

## 2024-04-04 ENCOUNTER — Emergency Department (HOSPITAL_COMMUNITY)
Admission: EM | Admit: 2024-04-04 | Discharge: 2024-04-04 | Disposition: A | Discharge status: Home / Self Care | Attending: Emergency Medicine | Admitting: Emergency Medicine

## 2024-04-04 DIAGNOSIS — W458XXA Other foreign body or object entering through skin, initial encounter: Secondary | ICD-10-CM | POA: Insufficient documentation

## 2024-04-04 DIAGNOSIS — T161XXA Foreign body in right ear, initial encounter: Secondary | ICD-10-CM | POA: Insufficient documentation

## 2024-04-04 NOTE — ED Provider Notes (Signed)
 [place] EMERGENCY DEPARTMENT AT [facility] Provider Note   CSN: [id number] Arrival date & time: 04/04/24  2050     History  Chief Complaint  Patient presents with   Foreign Body in Ear    [name] is a 68 y.o. male presents today for cotton from a Q-tip stuck in his right ear canal.  Patient states he was using a Q-tip to clean out his ear just prior to arrival when the cotton got stuck in his ear.  Patient reports mild discomfort and reduced hearing.  Patient has no other complaints at this time.   Foreign Body in Ear       Home Medications Prior to Admission medications   Medication Sig Start Date End Date Taking? Authorizing Provider  albuterol  (VENTOLIN  HFA) 108 (90 Base) MCG/ACT inhaler INHALE 2 PUFFS EVERY 6 HOURS AS NEEDED FOR WHEEZING OR SHORTNESS OF BREATH. 03/01/24   Lind Repine, MD  cetirizine  (ZYRTEC ) 10 MG tablet TAKE 1 TABLET BY MOUTH EVERY DAY 11/21/23   Lind Repine, MD  fluticasone  (FLONASE ) 50 MCG/ACT nasal spray PLACE 2 SPRAYS INTO BOTH NOSTRILS DAILY 08/03/23   Cobb, Mariah Shines, NP  ipratropium-albuterol  (DUONEB) 0.5-2.5 (3) MG/3ML SOLN INHALE 1 VIAL VIA NEBULIZER EVERY 6 HOURS AS NEEDED FOR SHORTNESS OF BREATH / WHEEZING 12/26/23   Wilhemena Harbour, MD  JARDIANCE  10 MG TABS tablet TAKE 1 TABLET BY MOUTH DAILY BEFORE BREAKFAST. 06/14/23   Bridgette Campus, MD  losartan  (COZAAR ) 25 MG tablet TAKE 1 TABLET (25 MG TOTAL) BY MOUTH DAILY. 06/14/23   Bridgette Campus, MD  losartan -hydrochlorothiazide  (HYZAAR) 50-12.5 MG tablet TAKE 1 TABLET BY MOUTH EVERY DAY 10/09/23   Genora Kidd, MD  metoprolol  succinate (TOPROL -XL) 25 MG 24 hr tablet TAKE 1/2 TABLET BY MOUTH DAILY 01/16/24   Bridgette Campus, MD  montelukast  (SINGULAIR ) 10 MG tablet TAKE 1 TABLET BY MOUTH EVERYDAY AT BEDTIME 03/06/24   Cobb, Mariah Shines, NP  OXYGEN  Inhale 2 L/min into the lungs as needed.    [provider]  predniSONE  (DELTASONE ) 10 MG tablet TAKE 1 TABLET (10 MG  TOTAL) BY MOUTH DAILY WITH BREAKFAST. 03/06/24   Cobb, Mariah Shines, NP  rosuvastatin  (CRESTOR ) 20 MG tablet TAKE 1/2 TABLET BY MOUTH DAILY Patient not taking: Reported on 01/04/2024 06/03/23   Wilhemena Harbour, MD  spironolactone  (ALDACTONE ) 25 MG tablet Take 0.5 tablets (12.5 mg total) by mouth daily. 03/01/24   Bridgette Campus, MD  TRELEGY ELLIPTA  200-62.5-25 MCG/ACT AEPB INHALE 1 PUFF BY MOUTH EVERY DAY 01/01/24   Wilhemena Harbour, MD      Allergies    Crestor  [rosuvastatin  calcium ], Zestril  [lisinopril ], Spiriva  respimat [tiotropium bromide  monohydrate], and Tiotropium    Review of Systems   Review of Systems  HENT:         Foreign body in ear    Physical Exam Updated Vital Signs BP (!) 204/93   Pulse 76   Temp 98.3 F (36.8 C) (Oral)   Resp 18   SpO2 100%  Physical Exam Vitals and nursing note reviewed.  Constitutional:      General: He is not in acute distress.    Appearance: Normal appearance. He is well-developed.  HENT:     Head: Normocephalic and atraumatic.     Right Ear: External ear normal. A foreign body is present.     Left Ear: Tympanic membrane and external ear normal.     Ears:     Comments: Post  foreign body removal, patient's right TM appears normal    Nose: Nose normal.  Eyes:     Extraocular Movements: Extraocular movements intact.     Conjunctiva/sclera: Conjunctivae normal.  Cardiovascular:     Rate and Rhythm: Normal rate and regular rhythm.  Pulmonary:     Effort: Pulmonary effort is normal. No respiratory distress.     Breath sounds: Normal breath sounds.  Abdominal:     Palpations: Abdomen is soft.     Tenderness: There is no abdominal tenderness.  Musculoskeletal:     Cervical back: Neck supple.  Skin:    General: Skin is warm and dry.     Capillary Refill: Capillary refill takes less than 2 seconds.  Neurological:     General: No focal deficit present.     Mental Status: He is alert.  Psychiatric:        Mood and Affect: Mood normal.      ED Results / Procedures / Treatments   Labs (all labs ordered are listed, but only abnormal results are displayed) Labs Reviewed - No data to display  EKG None  Radiology No results found.  Procedures .Foreign Body Removal  Date/Time: 04/04/2024 9:15 PM  Performed by: Carie Charity, PA-C Authorized by: Carie Charity, PA-C  Consent: Verbal consent obtained. Consent given by: patient Patient understanding: patient states understanding of the procedure being performed Patient identity confirmed: arm band Body area: ear Location details: right ear  Sedation: Patient sedated: no  Patient restrained: no Patient cooperative: yes Localization method: visualized Removal mechanism: alligator forceps Complexity: simple 1 objects recovered. Objects recovered: Cotton bud Post-procedure assessment: foreign body removed Patient tolerance: patient tolerated the procedure well with no immediate complications      Medications Ordered in ED Medications - No data to display  ED Course/ Medical Decision Making/ A&P                                 Medical Decision Making  This patient presents to the ED for concern of foreign body in right ear canal differential diagnosis includes foreign body in right ear canal   Problem List / ED Course:  Considered for mission further workup however patient's vital signs and physical exam are reassuring.  Patient states that his discomfort and reduced hearing has resolved since removing the cotton bud from his ear.  Patient given return precautions.  Patient safe for discharge at this time.        Final Clinical Impression(s) / ED Diagnoses Final diagnoses:  Foreign body of right ear, initial encounter    Rx / DC Orders ED Discharge Orders     None         Merryl Abraham 04/04/24 2117    Mordecai Applebaum, MD 04/05/24 1527

## 2024-04-04 NOTE — Discharge Instructions (Signed)
 Today you are seen for cotton from a Q-tip stuck in your right ear.  Please refrain from using Q-tips inside your ear.  You may use solutions like Debrox or earwax removal in the future.  Thank you for letting us  treat you today. After performing a physical exam, I feel you are safe to go home. Please follow up with your PCP in the next several days and provide them with your records from this visit. Return to the Emergency Room if pain becomes severe or symptoms worsen.

## 2024-04-04 NOTE — ED Triage Notes (Signed)
 Pt ambulatory to triage with c/o Q-tip stuck in ear. PA at bedside during triage, and removed object successfully.

## 2024-04-07 ENCOUNTER — Other Ambulatory Visit: Payer: Self-pay | Admitting: Nurse Practitioner

## 2024-04-07 ENCOUNTER — Other Ambulatory Visit: Payer: Self-pay | Admitting: Internal Medicine

## 2024-04-07 DIAGNOSIS — J449 Chronic obstructive pulmonary disease, unspecified: Secondary | ICD-10-CM

## 2024-04-20 ENCOUNTER — Other Ambulatory Visit: Payer: Self-pay | Admitting: Nurse Practitioner

## 2024-04-20 DIAGNOSIS — J449 Chronic obstructive pulmonary disease, unspecified: Secondary | ICD-10-CM

## 2024-04-22 ENCOUNTER — Telehealth: Payer: Self-pay | Admitting: Student

## 2024-04-22 ENCOUNTER — Ambulatory Visit: Payer: Self-pay

## 2024-04-22 NOTE — Telephone Encounter (Signed)
 [facility] [address]  PT needs a pred refill. He feels as though he had another refill. He has gone the whole month w/o it. COPD and Asthma [phone] Best #.  Very out of breath and no pred in a mo. He is on O2 and sounds very winded.

## 2024-04-22 NOTE — Telephone Encounter (Signed)
 Copied from CRM [id number]. Topic: Clinical - Red Word Triage >> Apr 22, 2024  1:23 PM Whitney O wrote: [facility] that prompted transfer to Nurse Triage: patient wife ms fannie was calling to get patient scheduled having breathing issues and suffering real bad . Tried looking for a appointment but didn't see one . Look like he has been seen by Girard Lam at [facility]   Patient states he has been trying to get his Prednisone  refilled. He states that he has put in multiple requests but has not heard back. He states he is willing to make an appointment to be seen for this but states he would like the prednisone  to help with his symptoms. Patient warm transferred to [facility] who will assist.    Reason for Disposition  [1] Prescription refill request for ESSENTIAL medicine (i.e., likelihood of harm to patient if not taken) AND [2] triager unable to refill per department policy  Answer Assessment - Initial Assessment Questions 1. DRUG NAME: "What medicine do you need to have refilled?"     Prednisone   4. PRESCRIBING HCP: "Who prescribed it?" Reason: If prescribed by specialist, call should be referred to that group.     Katherine Cobb 5. SYMPTOMS: "Do you have any symptoms?"     Increased shortness of breath  Protocols used: Medication Refill and Renewal Call-A-AH

## 2024-04-23 NOTE — Telephone Encounter (Signed)
 I tried calling the pt's spouse and there was no answer and no option to leave msg. Will call back. I was going to offer acute visit with Dr Dione Franks today at 11:15 since there is an opening.

## 2024-04-29 ENCOUNTER — Other Ambulatory Visit: Payer: Self-pay

## 2024-04-29 ENCOUNTER — Telehealth: Payer: Self-pay

## 2024-04-29 DIAGNOSIS — J449 Chronic obstructive pulmonary disease, unspecified: Secondary | ICD-10-CM

## 2024-04-29 MED ORDER — PREDNISONE 10 MG PO TABS: 10.0000 mg | ORAL_TABLET | Freq: Every day | ORAL | 0 refills | Status: DC

## 2024-04-29 NOTE — Telephone Encounter (Signed)
 Patient called needing a refill on prednisone . Sent in a refill to his [employer]. Pt will keep appt on 05/06/2024 to see [facility] NP.  Copied from CRM [id number]. Topic: General - Call Back - No Documentation >> Apr 29, 2024 12:14 PM Juliana Ocean wrote: Reason for CRM: pt states someone named Geselle Cardosa called him and asked for a call back. This is lunch time, so sending message.  I did remind him of appt on Mon, 6/16

## 2024-05-06 ENCOUNTER — Encounter: Payer: Self-pay | Admitting: Pulmonary Disease

## 2024-05-06 ENCOUNTER — Ambulatory Visit (INDEPENDENT_AMBULATORY_CARE_PROVIDER_SITE_OTHER): Admitting: Pulmonary Disease

## 2024-05-06 VITALS — BP 165/99 | Ht 69.0 in | Wt 154.0 lb

## 2024-05-06 DIAGNOSIS — J9611 Chronic respiratory failure with hypoxia: Secondary | ICD-10-CM | POA: Diagnosis not present

## 2024-05-06 DIAGNOSIS — J449 Chronic obstructive pulmonary disease, unspecified: Secondary | ICD-10-CM | POA: Diagnosis not present

## 2024-05-06 NOTE — Progress Notes (Addendum)
 Synopsis: Referred in July 2023 for acute visit, shortness of breath. Followed by Dr. Jude  Subjective:   PATIENT ID: [name] GENDER: male DOB: Apr 09, 1956, MRN: [id number]  HPI  Chief Complaint  Patient presents with   Follow-up   Daaiel Starlin is a 68 year old male, recent former smoker with COPD and nocturnal hypoxemia who returns to pulmonary clinic for COPD.  OV 06/03/22 He went to urgent care yesterday for shortness of breath. He was seen at urgent care 04/27/22, 03/29/22, 03/12/22 and 01/16/22 all for shortness of breath. Last seen by Dr. Jude 11/2021.  Increased shortness of breath over the past couple of days. He has a cough that is productive of clear sputum. He has been sneezing more often recently. He has been wheezing.   PFTs 2015 showed FVC 2.85L (79%) and FEV1 1.66L (57%).   CT Chest 06/02/22 for nodule follow up showed stable RUL apical nodule and new RML curvilinear parenchymal band. Moderate centrilobular emphysema.   OV 05/06/24 He experiences episodes of dyspnea, described as a squeezing sensation and insufficient air intake. He uses Trelegy daily and an albuterol  inhaler as needed, particularly in the mornings, before bed, and occasionally during the day. Oxygen  therapy is used at night at two liters via nasal cannula.  He increased his prednisone  dose from 10 mg to 20 mg daily due to worsening symptoms, with plans to reduce it once stabilized. Prednisone  provides symptom relief.  During exacerbations, he is significantly limited in mobility, requiring rest after minimal activity. When stable, he performs daily activities independently, including yard work and walking over a mile in the mornings.  He does not produce mucus and is a non-smoker. Heat and ragweed exacerbate his symptoms, affecting both COPD and asthma.  Past Medical History:  Diagnosis Date   Asthma    Colon polyp    COPD (chronic obstructive pulmonary disease) (HCC)    Emphysema lung (HCC)     Heart failure (HCC)    HLD (hyperlipidemia)    Hypertension    Shortness of breath      Family History  Problem Relation Age of Onset   Dementia Mother    Bladder Cancer Father    Heart disease Father    Lung cancer Father    Hypertension Brother    Hypertension Brother    Anemia Son    Hypertension Son      Social History   Socioeconomic History   Marital status: Married    Spouse name: Eliyahu Bille   Number of children: 1   Years of education: Not on file   Highest education level: Not on file  Occupational History   Occupation: Retired  Tobacco Use   Smoking status: Some Days    Current packs/day: 0.50    Average packs/day: 0.5 packs/day for 33.7 years (16.9 ttl pk-yrs)    Types: Cigarettes    Start date: 08/25/1990   Smokeless tobacco: Never  Vaping Use   Vaping status: Never Used  Substance and Sexual Activity   Alcohol use: Yes    Comment: occasional beer   Drug use: Not Currently   Sexual activity: Yes    Partners: Female  Other Topics Concern   Not on file  Social History Narrative   Not on file   Social Drivers of Health   Financial Resource Strain: Low Risk  (01/09/2023)   Overall Financial Resource Strain (CARDIA)    Difficulty of Paying Living Expenses: Not hard at all  Food  Insecurity: No Food Insecurity (01/09/2023)   Hunger Vital Sign    Worried About Running Out of Food in the Last Year: Never true    Ran Out of Food in the Last Year: Never true  Transportation Needs: No Transportation Needs (01/09/2023)   PRAPARE - Administrator, Civil Service (Medical): No    Lack of Transportation (Non-Medical): No  Physical Activity: Sufficiently Active (01/09/2023)   Exercise Vital Sign    Days of Exercise per Week: 7 days    Minutes of Exercise per Session: 60 min  Stress: No Stress Concern Present (01/09/2023)   [employer] of Occupational Health - Occupational Stress Questionnaire    Feeling of Stress : Not at all  Social  Connections: Moderately Integrated (01/09/2023)   Social Connection and Isolation Panel    Frequency of Communication with Friends and Family: More than three times a week    Frequency of Social Gatherings with Friends and Family: Twice a week    Attends Religious Services: More than 4 times per year    Active Member of [employer] or Organizations: No    Attends Banker Meetings: Never    Marital Status: Married  Catering manager Violence: Not At Risk (01/09/2023)   Humiliation, Afraid, Rape, and Kick questionnaire    Fear of Current or Ex-Partner: No    Emotionally Abused: No    Physically Abused: No    Sexually Abused: No     Allergies  Allergen Reactions   Crestor  [Rosuvastatin  Calcium ] Shortness Of Breath   Zestril  [Lisinopril ] Swelling   Spiriva  Respimat [Tiotropium Bromide  Monohydrate] Other (See Comments)    Red, glassy eyes   Tiotropium Other (See Comments)    Red glassy eyes      Outpatient Medications Prior to Visit  Medication Sig Dispense Refill   albuterol  (VENTOLIN  HFA) 108 (90 Base) MCG/ACT inhaler INHALE 2 PUFFS EVERY 6 HOURS AS NEEDED FOR WHEEZING OR SHORTNESS OF BREATH. 18 each 8   cetirizine  (ZYRTEC ) 10 MG tablet TAKE 1 TABLET BY MOUTH EVERY DAY 90 tablet 3   fluticasone  (FLONASE ) 50 MCG/ACT nasal spray PLACE 2 SPRAYS INTO BOTH NOSTRILS DAILY 48 mL 4   ipratropium-albuterol  (DUONEB) 0.5-2.5 (3) MG/3ML SOLN INHALE 1 VIAL VIA NEBULIZER EVERY 6 HOURS AS NEEDED FOR SHORTNESS OF BREATH / WHEEZING 270 mL 5   JARDIANCE  10 MG TABS tablet TAKE 1 TABLET BY MOUTH DAILY BEFORE BREAKFAST. 90 tablet 3   losartan  (COZAAR ) 25 MG tablet TAKE 1 TABLET (25 MG TOTAL) BY MOUTH DAILY. 90 tablet 3   losartan -hydrochlorothiazide  (HYZAAR) 50-12.5 MG tablet TAKE 1 TABLET BY MOUTH EVERY DAY 30 tablet 0   metoprolol  succinate (TOPROL -XL) 25 MG 24 hr tablet TAKE 1/2 TABLET BY MOUTH DAILY 7 tablet 0   montelukast  (SINGULAIR ) 10 MG tablet TAKE 1 TABLET BY MOUTH EVERYDAY AT BEDTIME 90  tablet 1   OXYGEN  Inhale 2 L/min into the lungs as needed.     predniSONE  (DELTASONE ) 10 MG tablet Take 1 tablet (10 mg total) by mouth daily with breakfast. 30 tablet 0   rosuvastatin  (CRESTOR ) 20 MG tablet TAKE 1/2 TABLET BY MOUTH DAILY (Patient not taking: Reported on 01/04/2024) 90 tablet 1   spironolactone  (ALDACTONE ) 25 MG tablet Take 0.5 tablets (12.5 mg total) by mouth daily. 8 tablet 0   TRELEGY ELLIPTA  200-62.5-25 MCG/ACT AEPB INHALE 1 PUFF BY MOUTH EVERY DAY 60 each 3   No facility-administered medications prior to visit.    Review of Systems  Constitutional:  Negative for chills, fever, malaise/fatigue and weight loss.  HENT:  Negative for congestion, sinus pain and sore throat.   Eyes: Negative.   Respiratory:  Positive for cough, sputum production, shortness of breath and wheezing. Negative for hemoptysis.   Cardiovascular:  Negative for chest pain, palpitations, orthopnea, claudication and leg swelling.  Gastrointestinal:  Negative for abdominal pain, heartburn, nausea and vomiting.  Genitourinary: Negative.   Musculoskeletal:  Negative for joint pain and myalgias.  Skin:  Negative for rash.  Neurological:  Negative for weakness.  Endo/Heme/Allergies: Negative.   Psychiatric/Behavioral: Negative.     Objective:   Vitals:   05/06/24 1449  BP: (!) 165/99  SpO2: 97%  Weight: 154 lb (69.9 kg)  Height: 5' 9 (1.753 m)   Physical Exam Constitutional:      General: He is not in acute distress. HENT:     Head: Normocephalic and atraumatic.   Eyes:     Conjunctiva/sclera: Conjunctivae normal.    Cardiovascular:     Rate and Rhythm: Normal rate and regular rhythm.     Pulses: Normal pulses.     Heart sounds: Normal heart sounds. No murmur heard. Pulmonary:     Breath sounds: Wheezing present. No rhonchi or rales.  Abdominal:     General: Bowel sounds are normal.   Musculoskeletal:     Right lower leg: No edema.     Left lower leg: No edema.   Skin:     General: Skin is warm and dry.   Neurological:     Mental Status: He is alert.    CBC    Component Value Date/Time   WBC 6.4 08/17/2022 0924   WBC 7.1 07/22/2022 1034   RBC 4.29 08/17/2022 0924   RBC 4.54 07/22/2022 1034   HGB 14.1 08/17/2022 0924   HCT 42.0 08/17/2022 0924   PLT 285 08/17/2022 0924   MCV 98 (H) 08/17/2022 0924   MCH 32.9 08/17/2022 0924   MCH 32.6 07/22/2022 1034   MCHC 33.6 08/17/2022 0924   MCHC 33.9 07/22/2022 1034   RDW 12.3 08/17/2022 0924   LYMPHSABS 2.8 08/17/2022 0924   MONOABS 1.7 (H) 03/27/2021 0250   EOSABS 1.0 (H) 08/17/2022 0924   BASOSABS 0.0 08/17/2022 0924      Latest Ref Rng & Units 08/17/2022    9:24 AM 07/22/2022   10:34 AM 03/29/2022   10:15 PM  BMP  Glucose 70 - 99 mg/dL 66  71  78   BUN 8 - 27 mg/dL 13  15  18    Creatinine 0.76 - 1.27 mg/dL 8.59  8.73  8.71   BUN/Creat Ratio 10 - 24 9     Sodium 134 - 144 mmol/L 142  140  140   Potassium 3.5 - 5.2 mmol/L 4.4  3.2  4.5   Chloride 96 - 106 mmol/L 105  110  115   CO2 20 - 29 mmol/L 22  22  19    Calcium  8.6 - 10.2 mg/dL 9.6  9.5  9.5    Chest imaging: CT Chest 01/05/24 1. Lung-RADS 1, negative. Continue annual screening with low-dose chest CT without contrast in 12 months. 2.  Aortic atherosclerosis (ICD10-I70.0). 3.  Emphysema (ICD10-J43.9).   PFT:     No data to display          Labs:  Path:  Echo:  Heart Catheterization:  Assessment & Plan:   Chronic obstructive pulmonary disease, unspecified COPD type (HCC)  Chronic respiratory failure with hypoxia (  [facility])  Discussion: Natalie Leclaire is a 69 year old male, recent former smoker with COPD and nocturnal hypoxemia who returns to pulmonary clinic for follow up.  Asthma-COPD Overlap Syndrome Recent exacerbation managed with increased prednisone . Breathing improved. Discussed injectable medication to reduce prednisone  dependency due to side effects. Open to considering if frequent flares persist. - Continue Trelegy,  one puff daily. - Use albuterol  inhaler as needed. - Complete current prednisone  course. - Consider injectable medication if frequent flares continue. - Order updated labs one month after completing prednisone  course. - Schedule follow-up in three months.  Oxygen  therapy dependence Uses 2 liters of oxygen  at night. Previous increase during exacerbation. - Continue nighttime oxygen  therapy at 2 liters via nasal cannula.  Follow up in 3 months.  Dorn Chill, MD [facility] Office: [phone]   Current Outpatient Medications:    albuterol  (VENTOLIN  HFA) 108 (90 Base) MCG/ACT inhaler, INHALE 2 PUFFS EVERY 6 HOURS AS NEEDED FOR WHEEZING OR SHORTNESS OF BREATH., Disp: 18 each, Rfl: 8   cetirizine  (ZYRTEC ) 10 MG tablet, TAKE 1 TABLET BY MOUTH EVERY DAY, Disp: 90 tablet, Rfl: 3   fluticasone  (FLONASE ) 50 MCG/ACT nasal spray, PLACE 2 SPRAYS INTO BOTH NOSTRILS DAILY, Disp: 48 mL, Rfl: 4   ipratropium-albuterol  (DUONEB) 0.5-2.5 (3) MG/3ML SOLN, INHALE 1 VIAL VIA NEBULIZER EVERY 6 HOURS AS NEEDED FOR SHORTNESS OF BREATH / WHEEZING, Disp: 270 mL, Rfl: 5   JARDIANCE  10 MG TABS tablet, TAKE 1 TABLET BY MOUTH DAILY BEFORE BREAKFAST., Disp: 90 tablet, Rfl: 3   losartan  (COZAAR ) 25 MG tablet, TAKE 1 TABLET (25 MG TOTAL) BY MOUTH DAILY., Disp: 90 tablet, Rfl: 3   losartan -hydrochlorothiazide  (HYZAAR) 50-12.5 MG tablet, TAKE 1 TABLET BY MOUTH EVERY DAY, Disp: 30 tablet, Rfl: 0   metoprolol  succinate (TOPROL -XL) 25 MG 24 hr tablet, TAKE 1/2 TABLET BY MOUTH DAILY, Disp: 7 tablet, Rfl: 0   montelukast  (SINGULAIR ) 10 MG tablet, TAKE 1 TABLET BY MOUTH EVERYDAY AT BEDTIME, Disp: 90 tablet, Rfl: 1   OXYGEN , Inhale 2 L/min into the lungs as needed., Disp: , Rfl:    predniSONE  (DELTASONE ) 10 MG tablet, Take 1 tablet (10 mg total) by mouth daily with breakfast., Disp: 30 tablet, Rfl: 0   rosuvastatin  (CRESTOR ) 20 MG tablet, TAKE 1/2 TABLET BY MOUTH DAILY (Patient not taking: Reported on  01/04/2024), Disp: 90 tablet, Rfl: 1   spironolactone  (ALDACTONE ) 25 MG tablet, Take 0.5 tablets (12.5 mg total) by mouth daily., Disp: 8 tablet, Rfl: 0   TRELEGY ELLIPTA  200-62.5-25 MCG/ACT AEPB, INHALE 1 PUFF BY MOUTH EVERY DAY, Disp: 60 each, Rfl: 3

## 2024-05-06 NOTE — Patient Instructions (Signed)
 Continue trelegy ellipta  1 puff daily - rinse mouth out after each use  Use albuterol  inhaler 1-2 puffs every 4-6 hours as needed or use duoneb nebulizer treatments every 4-6 hours  Continue to complete prednisone  course  Follow up in 3 months, call sooner if needed

## 2024-05-12 ENCOUNTER — Encounter: Payer: Self-pay | Admitting: Pulmonary Disease

## 2024-05-16 ENCOUNTER — Other Ambulatory Visit: Payer: Self-pay | Admitting: Student

## 2024-05-26 ENCOUNTER — Other Ambulatory Visit: Payer: Self-pay | Admitting: Nurse Practitioner

## 2024-05-26 DIAGNOSIS — J449 Chronic obstructive pulmonary disease, unspecified: Secondary | ICD-10-CM

## 2024-05-29 ENCOUNTER — Encounter: Admitting: Student

## 2024-05-31 ENCOUNTER — Encounter: Admitting: Student

## 2024-05-31 NOTE — Progress Notes (Deleted)
    SUBJECTIVE:   Chief compliant/HPI: annual examination  [name] is a 68 y.o. who presents today for an annual exam.   History tabs reviewed and updated ***.   Review of systems form reviewed and notable for ***.   OBJECTIVE:   There were no vitals taken for this visit.  ***  ASSESSMENT/PLAN:   Assessment & Plan  Annual Examination  See AVS for age appropriate recommendations.  PHQ score ***, reviewed and discussed.  Blood pressure value is *** goal, discussed.   Considered the following screening exams based upon USPSTF recommendations: Diabetes screening: {discussed/ordered:14545} Screening for elevated cholesterol: {discussed/ordered:14545} HIV testing: {discussed/ordered:14545} Hepatitis C: {discussed/ordered:14545} Neg 08/21/20 Hepatitis B: {discussed/ordered:14545} Neg 08/21/20 Syphilis if at high risk: {discussed/ordered:14545} Reviewed risk factors for latent tuberculosis and {not indicated/requested/declined:14582} Colorectal cancer screening: {crcscreen:23821::discussed, colonoscopy ordered} Lung cancer screening: {discussed/declined/written pwqn:80301} See documentation below regarding discussion and indication.  PSA discussed and after engaging in discussion of possible risks, benefits and complications of screening patient elected to ***.   Follow up in 1 year or sooner if indicated.  MyChart Activation: {MYCHARTLIST:32522}   Penne Rhein, MD [facility]

## 2024-05-31 NOTE — Patient Instructions (Incomplete)
 It was great to see you! Thank you for allowing me to participate in your care!  I recommend that you always bring your medications to each appointment as this makes it easy to ensure we are on the correct medications and helps us  not miss when refills are needed.  Our plans for today:  - Check Up  Checking cholesterol, A1c, kidney function  You are due for: Colonoscopy Tdap Zoster (Shingles) vaccine Covid 19 Vaccine -   We are checking some labs today, I will call you if they are abnormal will send you a MyChart message or a letter if they are normal.  If you do not hear about your labs in the next 2 weeks please let us  know.***  Take care and seek immediate care sooner if you develop any concerns.   Dr. Penne Rhein, MD [facility]

## 2024-06-22 ENCOUNTER — Other Ambulatory Visit: Payer: Self-pay | Admitting: Nurse Practitioner

## 2024-06-22 DIAGNOSIS — J449 Chronic obstructive pulmonary disease, unspecified: Secondary | ICD-10-CM

## 2024-07-03 ENCOUNTER — Other Ambulatory Visit: Payer: Self-pay

## 2024-07-03 MED ORDER — EMPAGLIFLOZIN 10 MG PO TABS
10.0000 mg | ORAL_TABLET | Freq: Every day | ORAL | 0 refills | Status: DC
Start: 2024-07-03 — End: 2024-09-03

## 2024-07-03 MED ORDER — EMPAGLIFLOZIN 10 MG PO TABS: 10.0000 mg | ORAL_TABLET | Freq: Every day | ORAL | 3 refills | Status: DC

## 2024-07-03 NOTE — Addendum Note (Signed)
 Addended by: BLUFORD, Kelcie Currie L on: 07/03/2024 10:01 AM   Modules accepted: Orders

## 2024-07-04 MED ORDER — ROSUVASTATIN CALCIUM 20 MG PO TABS: 10.0000 mg | ORAL_TABLET | Freq: Every day | ORAL | 0 refills | Status: DC

## 2024-07-22 ENCOUNTER — Other Ambulatory Visit: Payer: Self-pay | Admitting: Nurse Practitioner

## 2024-07-22 DIAGNOSIS — J449 Chronic obstructive pulmonary disease, unspecified: Secondary | ICD-10-CM

## 2024-07-28 ENCOUNTER — Other Ambulatory Visit: Payer: Self-pay

## 2024-07-31 ENCOUNTER — Ambulatory Visit: Admitting: Nurse Practitioner

## 2024-08-01 ENCOUNTER — Other Ambulatory Visit: Payer: Self-pay | Admitting: *Deleted

## 2024-08-01 MED ORDER — LOSARTAN POTASSIUM-HCTZ 50-12.5 MG PO TABS: 1.0000 | ORAL_TABLET | Freq: Every day | ORAL | 0 refills | Status: DC

## 2024-08-06 ENCOUNTER — Other Ambulatory Visit: Payer: Self-pay

## 2024-08-06 DIAGNOSIS — J449 Chronic obstructive pulmonary disease, unspecified: Secondary | ICD-10-CM

## 2024-08-09 ENCOUNTER — Other Ambulatory Visit: Payer: Self-pay

## 2024-08-09 DIAGNOSIS — J449 Chronic obstructive pulmonary disease, unspecified: Secondary | ICD-10-CM

## 2024-08-09 MED ORDER — IPRATROPIUM-ALBUTEROL 0.5-2.5 (3) MG/3ML IN SOLN: RESPIRATORY_TRACT | 0 refills | Status: DC

## 2024-08-26 ENCOUNTER — Other Ambulatory Visit: Payer: Self-pay

## 2024-09-03 ENCOUNTER — Ambulatory Visit (INDEPENDENT_AMBULATORY_CARE_PROVIDER_SITE_OTHER)

## 2024-09-03 VITALS — BP 184/96 | HR 81 | Ht 69.0 in | Wt 157.2 lb

## 2024-09-03 DIAGNOSIS — Z760 Encounter for issue of repeat prescription: Secondary | ICD-10-CM

## 2024-09-03 DIAGNOSIS — I5022 Chronic systolic (congestive) heart failure: Secondary | ICD-10-CM

## 2024-09-03 DIAGNOSIS — J441 Chronic obstructive pulmonary disease with (acute) exacerbation: Secondary | ICD-10-CM | POA: Diagnosis not present

## 2024-09-03 DIAGNOSIS — Z1211 Encounter for screening for malignant neoplasm of colon: Secondary | ICD-10-CM | POA: Diagnosis not present

## 2024-09-03 DIAGNOSIS — Z23 Encounter for immunization: Secondary | ICD-10-CM

## 2024-09-03 DIAGNOSIS — Z0000 Encounter for general adult medical examination without abnormal findings: Secondary | ICD-10-CM

## 2024-09-03 LAB — POCT GLYCOSYLATED HEMOGLOBIN (HGB A1C): Hemoglobin A1C: 4.8 % (ref 4.0–5.6)

## 2024-09-03 MED ORDER — ROSUVASTATIN CALCIUM 20 MG PO TABS
10.0000 mg | ORAL_TABLET | Freq: Every day | ORAL | 11 refills | Status: AC
Start: 2024-09-03 — End: ?

## 2024-09-03 MED ORDER — LOSARTAN POTASSIUM 25 MG PO TABS: 25.0000 mg | ORAL_TABLET | Freq: Every day | ORAL | 3 refills | Status: AC

## 2024-09-03 MED ORDER — AMOXICILLIN-POT CLAVULANATE 875-125 MG PO TABS: 1.0000 | ORAL_TABLET | Freq: Two times a day (BID) | ORAL | 0 refills | Status: AC

## 2024-09-03 MED ORDER — METOPROLOL SUCCINATE ER 25 MG PO TB24: 12.5000 mg | ORAL_TABLET | Freq: Every day | ORAL | 11 refills | Status: AC

## 2024-09-03 MED ORDER — EMPAGLIFLOZIN 10 MG PO TABS: 10.0000 mg | ORAL_TABLET | Freq: Every day | ORAL | 0 refills | Status: AC

## 2024-09-03 MED ORDER — PREDNISONE 50 MG PO TABS: 50.0000 mg | ORAL_TABLET | Freq: Every day | ORAL | 0 refills | Status: DC

## 2024-09-03 MED ORDER — SPIRONOLACTONE 25 MG PO TABS: 12.5000 mg | ORAL_TABLET | Freq: Every day | ORAL | 11 refills | Status: AC

## 2024-09-03 NOTE — Progress Notes (Signed)
 Complete physical exam  Patient: [name]   DOB: May 13, 1956   68 y.o. Male  MRN: [id number]  Subjective:    Chief Complaint  Patient presents with  . discuss some issues    Shoulder issues  Stomach issues as well but not as bad as before    . flu and covid vaccine     [name] is a 68 y.o. male who presents today for a complete physical exam. He reports consuming a {diet types:17450} diet. {types:19826} He generally feels {DESC; WELL/FAIRLY WELL/POORLY:18703}. He reports sleeping {DESC; WELL/FAIRLY WELL/POORLY:18703}. He does have additional problems to discuss today.   Productive cough Nasal congestion  Few months  No sore throat  Fever subjective intermittent  Nothing helps  Sick contact  2L most of the time at home, oxygen  at night regularly  More SOB than normal for the last month     Most recent fall risk assessment:    09/03/2024   10:53 AM  Fall Risk   Falls in the past year? 0     Most recent depression screenings:    11/13/2023    2:06 PM 01/09/2023    8:37 AM  PHQ 2/9 Scores  PHQ - 2 Score 0 0  PHQ- 9 Score 0     {VISON DENTAL STD PSA (Optional):27386}  {History (Optional):23778}  Patient Care Team: Lupie Credit, DO as PCP - General (Family Medicine) Alvan Ronal BRAVO, MD (Inactive) as PCP - Cardiology (Cardiology)   Outpatient Medications Prior to Visit  Medication Sig  . albuterol  (VENTOLIN  HFA) 108 (90 Base) MCG/ACT inhaler INHALE 2 PUFFS EVERY 6 HOURS AS NEEDED FOR WHEEZING OR SHORTNESS OF BREATH.  . cetirizine  (ZYRTEC ) 10 MG tablet TAKE 1 TABLET BY MOUTH EVERY DAY  . empagliflozin  (JARDIANCE ) 10 MG TABS tablet Take 1 tablet (10 mg total) by mouth daily before breakfast.  . fluticasone  (FLONASE ) 50 MCG/ACT nasal spray PLACE 2 SPRAYS INTO BOTH NOSTRILS DAILY  . ipratropium-albuterol  (DUONEB) 0.5-2.5 (3) MG/3ML SOLN INHALE 1 VIAL VIA NEBULIZER EVERY 6 HOURS AS NEEDED FOR SHORTNESS OF BREATH /  WHEEZING  . losartan  (COZAAR ) 25 MG  tablet TAKE 1 TABLET (25 MG TOTAL) BY MOUTH DAILY.  . losartan -hydrochlorothiazide  (HYZAAR) 50-12.5 MG tablet Take 1 tablet by mouth daily. MAKE APPT TO SEE PCP  . metoprolol  succinate (TOPROL -XL) 25 MG 24 hr tablet TAKE 1/2 TABLET BY MOUTH DAILY  . montelukast  (SINGULAIR ) 10 MG tablet TAKE 1 TABLET BY MOUTH EVERYDAY AT BEDTIME  . OXYGEN  Inhale 2 L/min into the lungs as needed.  . predniSONE  (DELTASONE ) 10 MG tablet TAKE 1 TABLET (10 MG TOTAL) BY MOUTH DAILY WITH BREAKFAST.  . rosuvastatin  (CRESTOR ) 20 MG tablet TAKE 1/2 TABLET BY MOUTH DAILY. *MAKE APPT. TO SEE PCP*  . spironolactone  (ALDACTONE ) 25 MG tablet Take 0.5 tablets (12.5 mg total) by mouth daily.  . TRELEGY ELLIPTA  200-62.5-25 MCG/ACT AEPB INHALE 1 PUFF BY MOUTH EVERY DAY   No facility-administered medications prior to visit.    ROS        Objective:     BP (!) 184/96   Pulse 81   Ht 5' 9 (1.753 m)   Wt 157 lb 3.2 oz (71.3 kg)   SpO2 98%   BMI 23.21 kg/m  {Vitals History (Optional):23777}  Physical Exam   No results found for any visits on 09/03/24. {Show previous labs (optional):23779}    Assessment & Plan:    Routine Health Maintenance and Physical Exam  Immunization History  Administered Date(s) Administered  . Fluad Quad(high Dose 65+) 08/07/2020, 09/14/2021, 09/01/2022  . Fluad Trivalent(High Dose 65+) 11/13/2023  . H1N1 12/02/2008  . Influenza Whole 09/06/2007, 12/02/2008  . Influenza, Seasonal, Injecte, Preservative Fre 08/01/2014  . Influenza,inj,Quad PF,6+ Mos 09/04/2015, 09/01/2018, 10/09/2019  . Influenza-Unspecified 08/21/2013  . PFIZER(Purple Top)SARS-COV-2 Vaccination 03/05/2020, 03/30/2020, 09/30/2020  . Pfizer Covid-19 Vaccine Bivalent Booster 44yrs & up 10/18/2021  . Pfizer(Comirnaty)Fall Seasonal Vaccine 12 years and older 11/13/2023  . Pneumococcal Conjugate-13 10/23/2015  . Pneumococcal Polysaccharide-23 08/21/2004, 08/28/2020  . Td 01/19/2006  . Zoster, Live 12/04/2015     Health Maintenance  Topic Date Due  . Zoster Vaccines- Shingrix (1 of 2) 11/01/2004  . DTaP/Tdap/Td (2 - Tdap) 01/20/2016  . Colonoscopy  06/15/2022  . Medicare Annual Wellness (AWV)  01/10/2024  . Influenza Vaccine  06/21/2024  . COVID-19 Vaccine (6 - 2025-26 season) 07/22/2024  . Pneumococcal Vaccine: 50+ Years  Completed  . Hepatitis C Screening  Completed  . Meningococcal B Vaccine  Aged Out    Discussed health benefits of physical activity, and encouraged him to engage in regular exercise appropriate for his age and condition.  Problem List Items Addressed This Visit   None  No follow-ups on file.     Camie Dixons, DO

## 2024-09-03 NOTE — Patient Instructions (Addendum)
 It was wonderful to see you today.  Please bring ALL of your medications with you to every visit.   Today we talked about:  Your cough and congestion with shortness of breath most likely due to a COPD/emphysema exacerbation.  I have prescribed you Augmentin  875-125 mg that you will take twice a day for 5 days and a 5-day course of 50 mg of prednisone  for this.  If symptoms do not get better or worsen please come back to the office to be seen.  Discussed when to go to the ED.  I have referred you back to the heart failure clinic as well as GI for colonoscopy.  Please watch out for these phone calls in the next 2 weeks, if you do not receive phone calls for appointments in the next 2 weeks, please reach out to me on MyChart and let me know.  Your MyChart username is [email or web address] and your password is [email or web address].  I have refilled all your medications today, please pick these up at your pharmacy.  I have completed the handicap placard application, please take this to the [facility] at your earliest convenience.  We are getting some blood work today, I will reach out to you with these results.  Follow-up with me in 1 month on Thursday, November 20 at 4 PM.  Thank you for choosing [facility].   Please call [phone] with any questions about today's appointment.  Please arrive at least 15 minutes prior to your scheduled appointments.   If you had blood work today, I will send you a MyChart message or a letter if results are normal. Otherwise, I will give you a call.   If you had a referral placed, they will call you to set up an appointment. Please give us  a call if you don't hear back in the next 2 weeks.   If you need additional refills before your next appointment, please call your pharmacy first.   Follow-up with me in 1 month on Thursday, November 20 at 4 PM.  Camie Dixons, DO Family Medicine

## 2024-09-04 LAB — LIPID PANEL
Chol/HDL Ratio: 3.3 ratio (ref 0.0–5.0)
Cholesterol, Total: 161 mg/dL (ref 100–199)
HDL: 49 mg/dL (ref >39)
LDL Chol Calc (NIH): 94 mg/dL (ref 0–99)
Triglycerides: 96 mg/dL (ref 0–149)
VLDL Cholesterol Cal: 18 mg/dL (ref 5–40)

## 2024-09-04 LAB — BASIC METABOLIC PANEL WITH GFR
BUN/Creatinine Ratio: 13 (ref 10–24)
BUN: 21 mg/dL (ref 8–27)
CO2: 19 mmol/L — ABNORMAL LOW (ref 20–29)
Calcium: 10 mg/dL (ref 8.6–10.2)
Chloride: 107 mmol/L — ABNORMAL HIGH (ref 96–106)
Creatinine, Ser: 1.59 mg/dL — ABNORMAL HIGH (ref 0.76–1.27)
Glucose: 67 mg/dL — ABNORMAL LOW (ref 70–99)
Potassium: 4.2 mmol/L (ref 3.5–5.2)
Sodium: 143 mmol/L (ref 134–144)
eGFR: 47 mL/min/1.73 — ABNORMAL LOW (ref >59)

## 2024-09-07 ENCOUNTER — Other Ambulatory Visit: Payer: Self-pay

## 2024-09-07 DIAGNOSIS — J449 Chronic obstructive pulmonary disease, unspecified: Secondary | ICD-10-CM

## 2024-09-09 ENCOUNTER — Other Ambulatory Visit: Payer: Self-pay | Admitting: Pulmonary Disease

## 2024-09-09 ENCOUNTER — Other Ambulatory Visit: Payer: Self-pay | Admitting: Nurse Practitioner

## 2024-09-09 DIAGNOSIS — J3089 Other allergic rhinitis: Secondary | ICD-10-CM

## 2024-09-09 DIAGNOSIS — J449 Chronic obstructive pulmonary disease, unspecified: Secondary | ICD-10-CM

## 2024-09-10 ENCOUNTER — Other Ambulatory Visit: Payer: Self-pay | Admitting: *Deleted

## 2024-09-10 MED ORDER — TRELEGY ELLIPTA 200-62.5-25 MCG/ACT IN AEPB: 1.0000 | INHALATION_SPRAY | Freq: Every day | RESPIRATORY_TRACT | 3 refills | Status: DC

## 2024-09-16 ENCOUNTER — Other Ambulatory Visit: Payer: Self-pay

## 2024-10-06 ENCOUNTER — Other Ambulatory Visit: Payer: Self-pay

## 2024-10-06 ENCOUNTER — Other Ambulatory Visit: Payer: Self-pay | Admitting: Nurse Practitioner

## 2024-10-06 DIAGNOSIS — J449 Chronic obstructive pulmonary disease, unspecified: Secondary | ICD-10-CM

## 2024-10-10 ENCOUNTER — Ambulatory Visit: Payer: Self-pay

## 2024-10-21 ENCOUNTER — Other Ambulatory Visit: Payer: Self-pay | Admitting: Nurse Practitioner

## 2024-10-21 DIAGNOSIS — J449 Chronic obstructive pulmonary disease, unspecified: Secondary | ICD-10-CM

## 2024-12-02 ENCOUNTER — Encounter

## 2024-12-15 ENCOUNTER — Other Ambulatory Visit: Payer: Self-pay | Admitting: Nurse Practitioner

## 2024-12-15 DIAGNOSIS — J449 Chronic obstructive pulmonary disease, unspecified: Secondary | ICD-10-CM

## 2024-12-18 ENCOUNTER — Other Ambulatory Visit: Payer: Self-pay

## 2025-01-06 ENCOUNTER — Other Ambulatory Visit
